# Patient Record
Sex: Female | Born: 1945 | Race: Black or African American | Hispanic: No | Marital: Single | Smoking: Never smoker
Health system: Southern US, Community
[De-identification: ages and names within clinical notes are randomized; demographics above are authoritative.]

## PROBLEM LIST (undated history)

## (undated) DIAGNOSIS — E119 Type 2 diabetes mellitus without complications: Secondary | ICD-10-CM

## (undated) DIAGNOSIS — H409 Unspecified glaucoma: Secondary | ICD-10-CM

## (undated) DIAGNOSIS — I639 Cerebral infarction, unspecified: Secondary | ICD-10-CM

## (undated) DIAGNOSIS — E785 Hyperlipidemia, unspecified: Secondary | ICD-10-CM

## (undated) DIAGNOSIS — F32A Depression, unspecified: Secondary | ICD-10-CM

## (undated) DIAGNOSIS — F329 Major depressive disorder, single episode, unspecified: Secondary | ICD-10-CM

## (undated) DIAGNOSIS — K219 Gastro-esophageal reflux disease without esophagitis: Secondary | ICD-10-CM

## (undated) DIAGNOSIS — I10 Essential (primary) hypertension: Secondary | ICD-10-CM

## (undated) DIAGNOSIS — N189 Chronic kidney disease, unspecified: Secondary | ICD-10-CM

## (undated) DIAGNOSIS — I2510 Atherosclerotic heart disease of native coronary artery without angina pectoris: Secondary | ICD-10-CM

## (undated) HISTORY — PX: ABDOMINAL HYSTERECTOMY: SHX81

---

## 2013-06-11 ENCOUNTER — Ambulatory Visit: Payer: Self-pay | Admitting: Gastroenterology

## 2015-06-23 ENCOUNTER — Other Ambulatory Visit: Payer: Self-pay | Admitting: Gastroenterology

## 2015-06-23 DIAGNOSIS — R1310 Dysphagia, unspecified: Secondary | ICD-10-CM

## 2015-07-19 ENCOUNTER — Ambulatory Visit: Admission: RE | Admit: 2015-07-19 | Payer: Self-pay | Source: Ambulatory Visit

## 2015-08-25 ENCOUNTER — Ambulatory Visit
Admission: RE | Admit: 2015-08-25 | Discharge: 2015-08-25 | Disposition: A | Discharge status: Home / Self Care | Payer: Medicare Other | Source: Ambulatory Visit | Attending: Gastroenterology | Admitting: Gastroenterology

## 2015-08-25 DIAGNOSIS — K224 Dyskinesia of esophagus: Secondary | ICD-10-CM | POA: Insufficient documentation

## 2015-08-25 DIAGNOSIS — R1310 Dysphagia, unspecified: Secondary | ICD-10-CM

## 2015-12-14 ENCOUNTER — Ambulatory Visit: Admit: 2015-12-14 | Payer: Medicare Other | Admitting: Gastroenterology

## 2015-12-14 SURGERY — ESOPHAGOGASTRODUODENOSCOPY (EGD) WITH PROPOFOL
Anesthesia: General

## 2017-11-14 ENCOUNTER — Other Ambulatory Visit: Payer: Self-pay | Admitting: Family Medicine

## 2017-11-14 DIAGNOSIS — R1312 Dysphagia, oropharyngeal phase: Secondary | ICD-10-CM

## 2017-11-15 ENCOUNTER — Other Ambulatory Visit: Payer: Self-pay | Admitting: Family Medicine

## 2017-11-15 DIAGNOSIS — R1312 Dysphagia, oropharyngeal phase: Secondary | ICD-10-CM

## 2017-11-21 ENCOUNTER — Ambulatory Visit: Payer: Medicare Other

## 2017-12-12 ENCOUNTER — Ambulatory Visit
Admission: RE | Admit: 2017-12-12 | Discharge: 2017-12-12 | Disposition: A | Discharge status: Home / Self Care | Payer: Medicare Other | Source: Ambulatory Visit | Attending: Family Medicine | Admitting: Family Medicine

## 2017-12-12 DIAGNOSIS — R1312 Dysphagia, oropharyngeal phase: Secondary | ICD-10-CM | POA: Insufficient documentation

## 2017-12-12 NOTE — Therapy (Signed)
Crystal Vargas, [place], [address] Phone: [phone]   Fax:     Modified Barium Swallow  Patient Details  Name: Crystal Vargas MRN: [id number] Date of Birth: December 30, 1945 No data recorded  Encounter Date: 12/12/2017  End of Session - 12/12/17 1338    Visit Number  1    Number of Visits  1    Date for SLP Re-Evaluation  12/12/17       No past medical history on file.    There were no vitals filed for this visit.   Subjective: Patient behavior: (alertness, ability to follow instructions, etc.):  The patient is able to follow directions within her physical capabilities  Chief complaint: food/pills getting stuck in esophagus   Barium Swallow Study 2017: "The study is very limited due to the patient's hemiparesisis and inability to stand or be semi upright. The patient ingested the barium without difficulty. The cervical esophagus distended well fluoroscopically and there was no laryngeal penetration of the barium. However, when the barium reached the thoracic esophagus significant stasis was observed. Very poor propagation of the primary or secondary peristaltic waves was observed. Under fluoroscopic observation the thoracic esophagus was seen to distend normally. The distal esophagus would gradually relax and allow all partial emptying of the esophagus. A barium tablet was not administered as it was obvious that can item of that size would not pass through the distal esophagus."   Objective:  Radiological Procedure: A videoflouroscopic evaluation of oral-preparatory, reflex initiation, and pharyngeal phases of the swallow was performed; as well as a screening of the upper esophageal phase.  I. POSTURE: Upright in MBS chair  II. VIEW: Lateral  III. COMPENSATORY STRATEGIES: natural chin down posture for most swallows- the one episode of aspiration occurred with the patient lifted her chin before initiating a  swallow  IV. BOLUSES ADMINISTERED:   Thin Liquid: 1 small, 2 consecutive, 3 straw   Nectar-thick Liquid: 2 small   Honey-thick Liquid: DNT   Puree: 2 teaspoon presentations   Mechanical Soft: 1/4 graham cracker in applesauce  V. RESULTS OF EVALUATION: A. ORAL PREPARATORY PHASE: (The lips, tongue, and velum are observed for strength and coordination)       **Overall Severity Rating: Mild-moderate; prolonged posterior transfer (5 seconds across consistencies)  B. SWALLOW INITIATION/REFLEX: (The reflex is normal if "triggered" by the time the bolus reached the base of the tongue)  **Overall Severity Rating: Mild-moderate; triggers while falling from the valleculae to the pyriform sinuses  C. PHARYNGEAL PHASE: (Pharyngeal function is normal if the bolus shows rapid, smooth, and continuous transit through the pharynx and there is no pharyngeal residue after the swallow)  **Overall Severity Rating: within normal limits   D. LARYNGEAL PENETRATION: (Material entering into the laryngeal inlet/vestibule but not aspirated) during the swallow with nectar-thick and thin liquids  E. ASPIRATION: X1, audible, before the swallow, occurred when patient lifted chin prior to initiating a pharyngeal swallow F. ESOPHAGEAL PHASE: (Screening of the upper esophagus)  ASSESSMENT: This 72 year old woman; with complaint of meds/foods sticking in the esophagus; is presenting with mild-moderate oropharyngeal dysphagia characterized by disorganized, prolonged posterior transfer, delayed pharyngeal swallow initiation, laryngeal penetration during swallow with liquids (nectar-thick and thin), and one episode of trace aspiration (audible) when patient lifted her chin to swallow the last thin liquid bolus.  Aspects of the pharyngeal stage of swallowing including tongue base retraction, hyolaryngeal excursion, epiglottic inversion, and duration/amplitude of UES opening are within  normal limits and there is no pharyngeal  residue.  The patient is at risk for trace aspiration but there does not appear to be history of pneumonia/bronchitis concerning for negative sequelae to aspiration.  The patient was given a whole barium tablet (in applesauce) which lodged in the cervical esophagus.  The patient's complaints appear to be primarily due to esophageal dysphagia and may benefit from consult with GI.  PLAN/RECOMMENDATIONS:   A. Diet: Mechanical soft   B. Swallowing Precautions: meds in puree, crush as able; chin down posture; monitor for increased clinical indicators of aspiration    C. Recommended consultation to: GI   D. Therapy recommendations: education   E. Results and recommendations were discussed with the patient immediately following the study and the final report routed to the referring MD and treating SLP.   Patient will benefit from skilled therapeutic intervention in order to improve the following deficits and impairments:   Oropharyngeal dysphagia - Plan: DG OP Swallowing Func-Medicare/Speech Path, DG OP Swallowing Func-Medicare/Speech Path        Problem List There are no active problems to display for this patient.  Crystal Sea, MS/CCC- SLP  Crystal Vargas 12/12/2017, 1:38 PM  [facility] [place], [place], [address] Phone: [phone]   Fax:     Name: Crystal Vargas MRN: [id number] Date of Birth: [birth year]

## 2017-12-25 ENCOUNTER — Other Ambulatory Visit: Payer: Self-pay | Admitting: Gastroenterology

## 2017-12-25 DIAGNOSIS — R1310 Dysphagia, unspecified: Secondary | ICD-10-CM

## 2017-12-28 ENCOUNTER — Ambulatory Visit
Admission: RE | Admit: 2017-12-28 | Discharge: 2017-12-28 | Disposition: A | Discharge status: Home / Self Care | Payer: Medicare Other | Source: Ambulatory Visit | Attending: Gastroenterology | Admitting: Gastroenterology

## 2017-12-28 DIAGNOSIS — R1310 Dysphagia, unspecified: Secondary | ICD-10-CM | POA: Insufficient documentation

## 2018-01-18 ENCOUNTER — Encounter: Payer: Self-pay | Admitting: *Deleted

## 2018-01-21 ENCOUNTER — Encounter
Admission: RE | Disposition: A | Discharge status: Home / Self Care | Payer: Self-pay | Source: Ambulatory Visit | Attending: Unknown Physician Specialty

## 2018-01-21 ENCOUNTER — Ambulatory Visit: Payer: Medicare Other | Admitting: Anesthesiology

## 2018-01-21 ENCOUNTER — Encounter: Payer: Self-pay | Admitting: *Deleted

## 2018-01-21 ENCOUNTER — Ambulatory Visit
Admission: RE | Admit: 2018-01-21 | Discharge: 2018-01-21 | Disposition: A | Discharge status: Home / Self Care | Payer: Medicare Other | Source: Ambulatory Visit | Attending: Unknown Physician Specialty | Admitting: Unknown Physician Specialty

## 2018-01-21 DIAGNOSIS — E1122 Type 2 diabetes mellitus with diabetic chronic kidney disease: Secondary | ICD-10-CM | POA: Insufficient documentation

## 2018-01-21 DIAGNOSIS — I2510 Atherosclerotic heart disease of native coronary artery without angina pectoris: Secondary | ICD-10-CM | POA: Insufficient documentation

## 2018-01-21 DIAGNOSIS — Z794 Long term (current) use of insulin: Secondary | ICD-10-CM | POA: Insufficient documentation

## 2018-01-21 DIAGNOSIS — R933 Abnormal findings on diagnostic imaging of other parts of digestive tract: Secondary | ICD-10-CM | POA: Insufficient documentation

## 2018-01-21 DIAGNOSIS — F329 Major depressive disorder, single episode, unspecified: Secondary | ICD-10-CM | POA: Insufficient documentation

## 2018-01-21 DIAGNOSIS — I129 Hypertensive chronic kidney disease with stage 1 through stage 4 chronic kidney disease, or unspecified chronic kidney disease: Secondary | ICD-10-CM | POA: Insufficient documentation

## 2018-01-21 DIAGNOSIS — H409 Unspecified glaucoma: Secondary | ICD-10-CM | POA: Insufficient documentation

## 2018-01-21 DIAGNOSIS — R1310 Dysphagia, unspecified: Secondary | ICD-10-CM | POA: Diagnosis present

## 2018-01-21 DIAGNOSIS — Z79899 Other long term (current) drug therapy: Secondary | ICD-10-CM | POA: Diagnosis not present

## 2018-01-21 DIAGNOSIS — K219 Gastro-esophageal reflux disease without esophagitis: Secondary | ICD-10-CM | POA: Diagnosis not present

## 2018-01-21 DIAGNOSIS — Z7902 Long term (current) use of antithrombotics/antiplatelets: Secondary | ICD-10-CM | POA: Insufficient documentation

## 2018-01-21 DIAGNOSIS — N189 Chronic kidney disease, unspecified: Secondary | ICD-10-CM | POA: Diagnosis not present

## 2018-01-21 DIAGNOSIS — I69354 Hemiplegia and hemiparesis following cerebral infarction affecting left non-dominant side: Secondary | ICD-10-CM | POA: Diagnosis not present

## 2018-01-21 DIAGNOSIS — K222 Esophageal obstruction: Secondary | ICD-10-CM | POA: Diagnosis not present

## 2018-01-21 DIAGNOSIS — E785 Hyperlipidemia, unspecified: Secondary | ICD-10-CM | POA: Insufficient documentation

## 2018-01-21 HISTORY — DX: Atherosclerotic heart disease of native coronary artery without angina pectoris: I25.10

## 2018-01-21 HISTORY — DX: Hyperlipidemia, unspecified: E78.5

## 2018-01-21 HISTORY — DX: Depression, unspecified: F32.A

## 2018-01-21 HISTORY — DX: Cerebral infarction, unspecified: I63.9

## 2018-01-21 HISTORY — PX: ESOPHAGOGASTRODUODENOSCOPY (EGD) WITH PROPOFOL: SHX5813

## 2018-01-21 HISTORY — DX: Major depressive disorder, single episode, unspecified: F32.9

## 2018-01-21 HISTORY — DX: Unspecified glaucoma: H40.9

## 2018-01-21 HISTORY — DX: Type 2 diabetes mellitus without complications: E11.9

## 2018-01-21 HISTORY — DX: Chronic kidney disease, unspecified: N18.9

## 2018-01-21 HISTORY — DX: Essential (primary) hypertension: I10

## 2018-01-21 HISTORY — DX: Gastro-esophageal reflux disease without esophagitis: K21.9

## 2018-01-21 SURGERY — ESOPHAGOGASTRODUODENOSCOPY (EGD) WITH PROPOFOL
Anesthesia: General

## 2018-01-21 MED ORDER — MIDAZOLAM HCL 5 MG/5ML IJ SOLN
INTRAMUSCULAR | Status: DC | PRN
  Administered 2018-01-21: 0.5 mg via INTRAVENOUS

## 2018-01-21 MED ORDER — SODIUM CHLORIDE 0.9 % IV SOLN
INTRAVENOUS | Status: DC
  Administered 2018-01-21: 08:00:00 via INTRAVENOUS

## 2018-01-21 MED ORDER — SODIUM CHLORIDE 0.9 % IV SOLN: INTRAVENOUS | Status: DC

## 2018-01-21 MED ORDER — FENTANYL CITRATE (PF) 100 MCG/2ML IJ SOLN
INTRAMUSCULAR | Status: DC | PRN
  Administered 2018-01-21: 25 ug via INTRAVENOUS

## 2018-01-21 MED ORDER — GLYCOPYRROLATE 0.2 MG/ML IJ SOLN
INTRAMUSCULAR | Status: DC | PRN
  Administered 2018-01-21: 0.2 mg via INTRAVENOUS

## 2018-01-21 MED ORDER — FENTANYL CITRATE (PF) 100 MCG/2ML IJ SOLN
INTRAMUSCULAR | Status: AC
  Filled 2018-01-21: qty 2

## 2018-01-21 MED ORDER — PROPOFOL 500 MG/50ML IV EMUL
INTRAVENOUS | Status: DC | PRN
  Administered 2018-01-21: 50 ug/kg/min via INTRAVENOUS

## 2018-01-21 MED ORDER — LIDOCAINE HCL (PF) 2 % IJ SOLN
INTRAMUSCULAR | Status: DC | PRN
  Administered 2018-01-21: 100 mg

## 2018-01-21 MED ORDER — BUTAMBEN-TETRACAINE-BENZOCAINE 2-2-14 % EX AERO
INHALATION_SPRAY | CUTANEOUS | Status: DC | PRN
  Administered 2018-01-21: 1 via TOPICAL

## 2018-01-21 MED ORDER — PROPOFOL 500 MG/50ML IV EMUL
INTRAVENOUS | Status: AC
  Filled 2018-01-21: qty 50

## 2018-01-21 MED ORDER — PROPOFOL 10 MG/ML IV BOLUS
INTRAVENOUS | Status: DC | PRN
  Administered 2018-01-21: 20 mg via INTRAVENOUS

## 2018-01-21 MED ORDER — LIDOCAINE HCL (PF) 2 % IJ SOLN
INTRAMUSCULAR | Status: AC
  Filled 2018-01-21: qty 10

## 2018-01-21 MED ORDER — GLYCOPYRROLATE 0.2 MG/ML IJ SOLN
INTRAMUSCULAR | Status: AC
  Filled 2018-01-21: qty 1

## 2018-01-21 MED ORDER — PHENYLEPHRINE HCL 10 MG/ML IJ SOLN
INTRAMUSCULAR | Status: AC
  Filled 2018-01-21: qty 1

## 2018-01-21 MED ORDER — MIDAZOLAM HCL 2 MG/2ML IJ SOLN
INTRAMUSCULAR | Status: AC
  Filled 2018-01-21: qty 2

## 2018-01-21 MED ORDER — EPHEDRINE SULFATE 50 MG/ML IJ SOLN
INTRAMUSCULAR | Status: AC
  Filled 2018-01-21: qty 1

## 2018-01-21 MED ORDER — BUTAMBEN-TETRACAINE-BENZOCAINE 2-2-14 % EX AERO
INHALATION_SPRAY | CUTANEOUS | Status: AC
  Filled 2018-01-21: qty 5

## 2018-01-21 NOTE — Op Note (Signed)
[facility] Patient Name: Crystal Vargas Procedure Date: 01/21/2018 7:28 AM MRN: [id number] Account #: [id number] Date of Birth: 04-03-1945 Admit Type: Outpatient Age: 72 Room: [facility] ENDO ROOM 1 Gender: Female Note Status: Finalized Procedure:            Upper GI endoscopy Indications:          Dysphagia, Abnormal UGI series Providers:            Manya Silvas, MD Referring MD:         No Local Md, MD (Referring MD) Medicines:            Propofol per Anesthesia, Cetacaine spray Complications:        No immediate complications. Procedure:            Pre-Anesthesia Assessment:                       - After reviewing the risks and benefits, the patient                        was deemed in satisfactory condition to undergo the                        procedure.                       After obtaining informed consent, the endoscope was                        passed under direct vision. Throughout the procedure,                        the patient's blood pressure, pulse, and oxygen                        saturations were monitored continuously. The Endoscope                        was introduced through the mouth, and advanced to the                        second part of duodenum. The upper GI endoscopy was                        accomplished without difficulty. The patient tolerated                        the procedure well. Findings:      A moderate Schatzki ring was found in the proximal esophagus and in the       distal esophagus.      The stomach was normal.      The examined duodenum was normal.      After exam of the esophagus showing rings proximal and distal I passed a       guidewire and removed the scope and then passed a 38 F      savary dilator and a 42 F dilator. Some blood seen on dilator. Impression:           - Moderate Schatzki ring.                       -  Normal stomach.                       - Normal examined duodenum.             - No specimens collected. Recommendation:       - The findings and recommendations were discussed with                        the patient's family. Manya Silvas, MD 01/21/2018 8:13:02 AM This report has been signed electronically. Number of Addenda: 0 Note Initiated On: 01/21/2018 7:28 AM      [facility]

## 2018-01-21 NOTE — Anesthesia Post-op Follow-up Note (Signed)
Anesthesia QCDR form completed.        

## 2018-01-21 NOTE — H&P (Signed)
Primary Care Physician:  Marisa Hua, MD Primary Gastroenterologist:  Dr. Vira Agar  Pre-Procedure History & Physical: HPI:  Crystal Vargas is a 72 y.o. female is here for an endoscopy.  Done for dysphagia and abnormal barium tablet study.     Past Medical History:  Diagnosis Date  . Chronic kidney disease    RENAL INSUFFICIENCY  . Coronary artery disease   . Depression   . Diabetes mellitus without complication ([place])   . GERD (gastroesophageal reflux disease)   . Glaucoma   . Hyperlipidemia   . Hypertension   . Stroke [facility])     Past Surgical History:  Procedure Laterality Date  . ABDOMINAL HYSTERECTOMY      Prior to Admission medications   Medication Sig Start Date End Date Taking? Authorizing Provider  acetaminophen (TYLENOL) 325 MG tablet Take 650 mg by mouth every 6 (six) hours as needed.   Yes [provider]  allopurinol (ZYLOPRIM) 100 MG tablet Take 100 mg by mouth daily.   Yes [provider]  amLODipine (NORVASC) 10 MG tablet Take 10 mg by mouth daily.   Yes [provider]  atorvastatin (LIPITOR) 20 MG tablet Take 20 mg by mouth daily.   Yes [provider]  atorvastatin (LIPITOR) 40 MG tablet Take 40 mg by mouth daily.   Yes [provider]  brimonidine (ALPHAGAN) 0.2 % ophthalmic solution 1 drop 3 (three) times daily.   Yes [provider]  brimonidine-timolol (COMBIGAN) 0.2-0.5 % ophthalmic solution Place 1 drop into both eyes every 12 (twelve) hours.   Yes [provider]  chlorthalidone (HYGROTON) 25 MG tablet Take 25 mg by mouth daily.   Yes [provider]  cloNIDine (CATAPRES - DOSED IN MG/24 HR) 0.3 mg/24hr patch Place 0.3 mg onto the skin once a week.   Yes [provider]  cloNIDine (CATAPRES) 0.2 MG tablet Take 0.2 mg by mouth 2 (two) times daily.   Yes [provider]  clopidogrel (PLAVIX) 75 MG tablet Take 75 mg by mouth daily.   Yes [provider]   Cyanocobalamin (VITAMIN B 12) 500 MCG TABS Take 1,000 mcg by mouth daily.   Yes [provider]  DM-Benzocaine-Menthol (CHLORASEPTIC TOTAL MT) Use as directed 1 drop in the mouth or throat as directed. 1 GTT.EVERY HOUR   Yes [provider]  gabapentin (NEURONTIN) 400 MG capsule Take 400 mg by mouth 2 (two) times daily.   Yes [provider]  hydrALAZINE (APRESOLINE) 25 MG tablet Take 25 mg by mouth 2 (two) times daily.   Yes [provider]  hydrochlorothiazide (HYDRODIURIL) 25 MG tablet Take 25 mg by mouth daily.   Yes [provider]  insulin glargine (LANTUS) 100 UNIT/ML injection Inject 20 Units into the skin daily.   Yes [provider]  insulin NPH-regular Human (70-30) 100 UNIT/ML injection Inject 100 Units into the skin 2 (two) times daily with a meal.   Yes [provider]  Iron-Vitamins (THEREMS H PO) Take 1 tablet by mouth daily.   Yes [provider]  latanoprost (XALATAN) 0.005 % ophthalmic solution Place 1 drop into both eyes at bedtime.   Yes [provider]  lisinopril (PRINIVIL,ZESTRIL) 40 MG tablet Take 40 mg by mouth 2 (two) times daily.   Yes [provider]  metoprolol succinate (TOPROL-XL) 25 MG 24 hr tablet Take 25 mg by mouth daily.   Yes [provider]  Multiple Vitamins-Calcium (ONE-A-DAY WOMENS FORMULA) TABS Take 1  tablet by mouth daily.   Yes [provider]  Oxcarbazepine (TRILEPTAL) 300 MG tablet Take 300 mg by mouth 2 (two) times daily.   Yes [provider]  pantoprazole (PROTONIX) 40 MG tablet Take 40 mg by mouth daily.   Yes [provider]  phenytoin (DILANTIN) 100 MG ER capsule Take 200 mg by mouth at bedtime.   Yes [provider]  phenytoin (DILANTIN) 50 MG tablet Chew by mouth 3 (three) times daily.   Yes [provider]  simvastatin (ZOCOR) 20 MG tablet Take 20 mg by mouth daily.   Yes [provider]   tiZANidine (ZANAFLEX) 4 MG capsule Take 4 mg by mouth 3 (three) times daily.   Yes [provider]  traZODone (DESYREL) 150 MG tablet Take 150 mg by mouth at bedtime.   Yes [provider]  traZODone (DESYREL) 50 MG tablet Take 50 mg by mouth at bedtime.   Yes [provider]  Wheat Dextrin (BENEFIBER DRINK MIX PO) Take 1 Scoop by mouth daily.   Yes [provider]  isosorbide dinitrate (ISORDIL) 10 MG tablet Take 10 mg by mouth 3 (three) times daily.    [provider]  loratadine (CLARITIN) 10 MG tablet Take 10 mg by mouth daily.    [provider]    Allergies as of 01/10/2018  . (Not on File)    History reviewed. No pertinent family history.  Social History   Socioeconomic History  . Marital status: Single    Spouse name: Not on file  . Number of children: Not on file  . Years of education: Not on file  . Highest education level: Not on file  Occupational History  . Not on file  Social Needs  . Financial resource strain: Not on file  . Food insecurity:    Worry: Not on file    Inability: Not on file  . Transportation needs:    Medical: Not on file    Non-medical: Not on file  Tobacco Use  . Smoking status: Never Smoker  . Smokeless tobacco: Never Used  Substance and Sexual Activity  . Alcohol use: Not Currently  . Drug use: Never  . Sexual activity: Not on file  Lifestyle  . Physical activity:    Days per week: Not on file    Minutes per session: Not on file  . Stress: Not on file  Relationships  . Social connections:    Talks on phone: Not on file    Gets together: Not on file    Attends religious service: Not on file    Active member of club or organization: Not on file    Attends meetings of clubs or organizations: Not on file    Relationship status: Not on file  . Intimate partner violence:    Fear of current or ex partner: Not on file    Emotionally abused: Not on file    Physically abused: Not on  file    Forced sexual activity: Not on file  Other Topics Concern  . Not on file  Social History Narrative  . Not on file    Review of Systems: See HPI, otherwise negative ROS  Physical Exam: BP (!) 176/74   Pulse 64   Temp (!) 96.9 F (36.1 C) (Tympanic)   Resp 18   Ht 5' 3.5" (1.613 m)   Wt 76.2 kg   BMI 29.29 kg/m  General:   Alert,  pleasant and cooperative in NAD  Head:  Normocephalic and atraumatic. Neck:  Supple; no masses or thyromegaly. Lungs:  Clear throughout to auscultation.    Heart:  Regular rate and rhythm. Abdomen:  Soft, nontender and nondistended. Normal bowel sounds, without guarding, and without rebound.   Neurologic:  Alert and  oriented x4;  grossly normal neurologically.  Impression/Plan: Crystal Vargas is here for an endoscopy to be performed for dysphagia with mild relative narrowing of upper cervical esophagus.  Risks, benefits, limitations, and alternatives regarding  endoscopy have been reviewed with the patient.  Questions have been answered.  All parties agreeable.   Gaylyn Cheers, MD  01/21/2018, 7:51 AM

## 2018-01-21 NOTE — Anesthesia Postprocedure Evaluation (Signed)
Anesthesia Post Note  Patient: Crystal Vargas  Procedure(s) Performed: ESOPHAGOGASTRODUODENOSCOPY (EGD) WITH PROPOFOL (N/A )  Patient location during evaluation: PACU Anesthesia Type: General Level of consciousness: awake and alert Pain management: pain level controlled Vital Signs Assessment: post-procedure vital signs reviewed and stable Respiratory status: spontaneous breathing, nonlabored ventilation, respiratory function stable and patient connected to nasal cannula oxygen Cardiovascular status: blood pressure returned to baseline and stable Postop Assessment: no apparent nausea or vomiting Anesthetic complications: no     Last Vitals:  Vitals:   01/21/18 0836 01/21/18 0846  BP: (!) 154/106 (!) 156/107  Pulse: 69 66  Resp: 15 15  Temp:    SpO2: 100% 100%    Last Pain:  Vitals:   01/21/18 0846  TempSrc:   PainSc: 0-No pain                 Durenda Hurt

## 2018-01-21 NOTE — Anesthesia Preprocedure Evaluation (Addendum)
Anesthesia Evaluation  Patient identified by MRN, date of birth, ID band Patient awake    Reviewed: Allergy & Precautions, H&P , NPO status , Patient's Chart, lab work & pertinent test results  Airway Mallampati: III       Dental  (+) Chipped, Missing   Pulmonary neg pulmonary ROS, neg COPD,           Cardiovascular hypertension, + CAD  negative cardio ROS       Neuro/Psych PSYCHIATRIC DISORDERS Depression CVA (2002, left sided weakness), Residual Symptoms negative psych ROS   GI/Hepatic negative GI ROS, Neg liver ROS, GERD  ,  Endo/Other  negative endocrine ROSdiabetes  Renal/GU CRFRenal diseasenegative Renal ROS  negative genitourinary   Musculoskeletal   Abdominal   Peds  Hematology negative hematology ROS (+)   Anesthesia Other Findings Past Medical History: No date: Chronic kidney disease     Comment:  RENAL INSUFFICIENCY No date: Coronary artery disease No date: Depression No date: Diabetes mellitus without complication ([facility]) No date: GERD (gastroesophageal reflux disease) No date: Glaucoma No date: Hyperlipidemia No date: Hypertension No date: Stroke [facility])  Past Surgical History: No date: ABDOMINAL HYSTERECTOMY     Reproductive/Obstetrics negative OB ROS                            Anesthesia Physical Anesthesia Plan  ASA: III  Anesthesia Plan: General   Post-op Pain Management:    Induction:   PONV Risk Score and Plan: Propofol infusion and TIVA  Airway Management Planned:   Additional Equipment:   Intra-op Plan:   Post-operative Plan:   Informed Consent: I have reviewed the patients History and Physical, chart, labs and discussed the procedure including the risks, benefits and alternatives for the proposed anesthesia with the patient or authorized representative who has indicated his/her understanding and acceptance.   Dental Advisory Given  Plan  Discussed with: Anesthesiologist, CRNA and Surgeon  Anesthesia Plan Comments:         Anesthesia Quick Evaluation

## 2018-01-21 NOTE — Transfer of Care (Signed)
Immediate Anesthesia Transfer of Care Note  Patient: Crystal Vargas  Procedure(s) Performed: ESOPHAGOGASTRODUODENOSCOPY (EGD) WITH PROPOFOL (N/A )  Patient Location: PACU  Anesthesia Type:General  Level of Consciousness: awake, alert  and oriented  Airway & Oxygen Therapy: Patient Spontanous Breathing and Patient connected to nasal cannula oxygen  Post-op Assessment: Report given to RN and Post -op Vital signs reviewed and stable  Post vital signs: Reviewed and stable  Last Vitals:  Vitals Value Taken Time  BP    Temp    Pulse    Resp    SpO2      Last Pain:  Vitals:   01/21/18 0738  TempSrc: Tympanic  PainSc: 0-No pain         Complications: No apparent anesthesia complications

## 2018-05-27 ENCOUNTER — Ambulatory Visit
Admission: RE | Admit: 2018-05-27 | Payer: Medicare Other | Source: Home / Self Care | Admitting: Unknown Physician Specialty

## 2018-05-27 ENCOUNTER — Encounter: Admission: RE | Payer: Self-pay | Source: Home / Self Care

## 2018-05-27 SURGERY — ESOPHAGOGASTRODUODENOSCOPY (EGD) WITH PROPOFOL
Anesthesia: General

## 2019-04-08 ENCOUNTER — Encounter (INDEPENDENT_AMBULATORY_CARE_PROVIDER_SITE_OTHER): Payer: Medicare Other | Admitting: Vascular Surgery

## 2019-04-15 ENCOUNTER — Ambulatory Visit (INDEPENDENT_AMBULATORY_CARE_PROVIDER_SITE_OTHER): Payer: Medicare Other | Admitting: Vascular Surgery

## 2019-04-15 ENCOUNTER — Other Ambulatory Visit: Payer: Self-pay

## 2019-04-15 ENCOUNTER — Encounter (INDEPENDENT_AMBULATORY_CARE_PROVIDER_SITE_OTHER): Payer: Self-pay | Admitting: Vascular Surgery

## 2019-04-15 DIAGNOSIS — Z992 Dependence on renal dialysis: Secondary | ICD-10-CM

## 2019-04-15 DIAGNOSIS — Z794 Long term (current) use of insulin: Secondary | ICD-10-CM

## 2019-04-15 DIAGNOSIS — E1122 Type 2 diabetes mellitus with diabetic chronic kidney disease: Secondary | ICD-10-CM | POA: Diagnosis not present

## 2019-04-15 DIAGNOSIS — E119 Type 2 diabetes mellitus without complications: Secondary | ICD-10-CM | POA: Insufficient documentation

## 2019-04-15 DIAGNOSIS — N186 End stage renal disease: Secondary | ICD-10-CM | POA: Insufficient documentation

## 2019-04-15 DIAGNOSIS — I10 Essential (primary) hypertension: Secondary | ICD-10-CM | POA: Diagnosis not present

## 2019-04-15 NOTE — Assessment & Plan Note (Signed)
The patient has a PermCath in place with a normal exit site and no signs of infection.  There is no erythema or drainage.  There is no associated wound outside of the exit site which is typical.  I see no problem with his catheter at all.  Continue to use the catheter.

## 2019-04-15 NOTE — Progress Notes (Signed)
Patient ID: Crystal Vargas, female   DOB: 1945/09/25, 74 y.o.   MRN: [id number]  Chief Complaint  Patient presents with  . New Patient (Initial Visit)    NP Late open wound on cvc W drainage     HPI Crystal Vargas is a 74 y.o. female.  I am asked to see the patient by Dr. Holley Raring for evaluation of a reported wound around the central venous catheter.  The patient had this placed some months ago at another institution.  We have not seen the patient prior to this.  It does not hurt the patient.  She has no fevers or chills.  This was her first dialysis access placement.     Past Medical History:  Diagnosis Date  . Chronic kidney disease    RENAL INSUFFICIENCY  . Coronary artery disease   . Depression   . Diabetes mellitus without complication ([place])   . GERD (gastroesophageal reflux disease)   . Glaucoma   . Hyperlipidemia   . Hypertension   . Stroke [facility])     Past Surgical History:  Procedure Laterality Date  . ABDOMINAL HYSTERECTOMY    . ESOPHAGOGASTRODUODENOSCOPY (EGD) WITH PROPOFOL N/A 01/21/2018   Procedure: ESOPHAGOGASTRODUODENOSCOPY (EGD) WITH PROPOFOL;  Surgeon: Manya Silvas, MD;  Location: [facility] ENDOSCOPY;  Service: Endoscopy;  Laterality: N/A;    Family History No bleeding or clotting disorders.  Lives in a facility and she is a very poor historian  Social History   Tobacco Use  . Smoking status: Never Smoker  . Smokeless tobacco: Never Used  Substance Use Topics  . Alcohol use: Not Currently  . Drug use: Never  Lives in a facility   Allergies  Allergen Reactions  . Abacavir Other (See Comments)  . Cephalosporins Other (See Comments)  . Ciprofloxacin Other (See Comments)  . Nsaids Other (See Comments)  . Penicillin G Other (See Comments)  . Quinolones Other (See Comments)  . Salicylates Other (See Comments)    Current Outpatient Medications  Medication Sig Dispense Refill  . acetaminophen (TYLENOL) 325 MG tablet Take 650 mg by mouth every 6 (six) hours  as needed.    Marland Kitchen allopurinol (ZYLOPRIM) 100 MG tablet Take 100 mg by mouth daily.    Marland Kitchen amLODipine (NORVASC) 10 MG tablet Take 10 mg by mouth daily.    Marland Kitchen atorvastatin (LIPITOR) 20 MG tablet Take 20 mg by mouth daily.    Marland Kitchen atorvastatin (LIPITOR) 40 MG tablet Take 40 mg by mouth daily.    . brimonidine (ALPHAGAN) 0.2 % ophthalmic solution 1 drop 3 (three) times daily.    . brimonidine-timolol (COMBIGAN) 0.2-0.5 % ophthalmic solution Place 1 drop into both eyes every 12 (twelve) hours.    . chlorthalidone (HYGROTON) 25 MG tablet Take 25 mg by mouth daily.    . cloNIDine (CATAPRES - DOSED IN MG/24 HR) 0.3 mg/24hr patch Place 0.3 mg onto the skin once a week.    . cloNIDine (CATAPRES) 0.2 MG tablet Take 0.2 mg by mouth 2 (two) times daily.    . clopidogrel (PLAVIX) 75 MG tablet Take 75 mg by mouth daily.    . Cyanocobalamin (VITAMIN B 12) 500 MCG TABS Take 1,000 mcg by mouth daily.    Marland Kitchen DM-Benzocaine-Menthol (CHLORASEPTIC TOTAL MT) Use as directed 1 drop in the mouth or throat as directed. 1 GTT.EVERY HOUR    . gabapentin (NEURONTIN) 400 MG capsule Take 400 mg by mouth 2 (two) times daily.    . hydrALAZINE (APRESOLINE) 25  MG tablet Take 25 mg by mouth 2 (two) times daily.    . hydrochlorothiazide (HYDRODIURIL) 25 MG tablet Take 25 mg by mouth daily.    . insulin glargine (LANTUS) 100 UNIT/ML injection Inject 20 Units into the skin daily.    . insulin NPH-regular Human (70-30) 100 UNIT/ML injection Inject 100 Units into the skin 2 (two) times daily with a meal.    . Iron-Vitamins (THEREMS H PO) Take 1 tablet by mouth daily.    . isosorbide dinitrate (ISORDIL) 10 MG tablet Take 10 mg by mouth 3 (three) times daily.    Marland Kitchen latanoprost (XALATAN) 0.005 % ophthalmic solution Place 1 drop into both eyes at bedtime.    Marland Kitchen lisinopril (PRINIVIL,ZESTRIL) 40 MG tablet Take 40 mg by mouth 2 (two) times daily.    Marland Kitchen loratadine (CLARITIN) 10 MG tablet Take 10 mg by mouth daily.    . metoprolol succinate (TOPROL-XL) 25  MG 24 hr tablet Take 25 mg by mouth daily.    . Multiple Vitamins-Calcium (ONE-A-DAY WOMENS FORMULA) TABS Take 1 tablet by mouth daily.    . Oxcarbazepine (TRILEPTAL) 300 MG tablet Take 300 mg by mouth 2 (two) times daily.    . pantoprazole (PROTONIX) 40 MG tablet Take 40 mg by mouth daily.    . phenytoin (DILANTIN) 100 MG ER capsule Take 200 mg by mouth at bedtime.    . phenytoin (DILANTIN) 50 MG tablet Chew by mouth 3 (three) times daily.    . simvastatin (ZOCOR) 20 MG tablet Take 20 mg by mouth daily.    Marland Kitchen tiZANidine (ZANAFLEX) 4 MG capsule Take 4 mg by mouth 3 (three) times daily.    . traZODone (DESYREL) 150 MG tablet Take 150 mg by mouth at bedtime.    . traZODone (DESYREL) 50 MG tablet Take 50 mg by mouth at bedtime.    . Wheat Dextrin (BENEFIBER DRINK MIX PO) Take 1 Scoop by mouth daily.     No current facility-administered medications for this visit.      REVIEW OF SYSTEMS (Negative unless checked)  Constitutional: [] Weight loss  [] Fever  [] Chills Cardiac: [] Chest pain   [] Chest pressure   [] Palpitations   [] Shortness of breath when laying flat   [] Shortness of breath at rest   [] Shortness of breath with exertion. Vascular:  [] Pain in legs with walking   [] Pain in legs at rest   [] Pain in legs when laying flat   [] Claudication   [] Pain in feet when walking  [] Pain in feet at rest  [] Pain in feet when laying flat   [] History of DVT   [] Phlebitis   [] Swelling in legs   [] Varicose veins   [] Non-healing ulcers Pulmonary:   [] Uses home oxygen   [] Productive cough   [] Hemoptysis   [] Wheeze  [] COPD   [] Asthma Neurologic:  [] Dizziness  [] Blackouts   [] Seizures   [] History of stroke   [] History of TIA  [] Aphasia   [] Temporary blindness   [] Dysphagia   [] Weakness or numbness in arms   [] Weakness or numbness in legs Musculoskeletal:  [x] Arthritis   [] Joint swelling   [] Joint pain   [] Low back pain Hematologic:  [] Easy bruising  [] Easy bleeding   [] Hypercoagulable state   [] Anemic   [] Hepatitis Gastrointestinal:  [] Blood in stool   [] Vomiting blood  [] Gastroesophageal reflux/heartburn   [] Abdominal pain Genitourinary:  [x] Chronic kidney disease   [] Difficult urination  [] Frequent urination  [] Burning with urination   [] Hematuria Skin:  [] Rashes   [] Ulcers   [] Wounds  Psychological:  [] History of anxiety   []  History of major depression.    Physical Exam BP (!) 78/49 (BP Location: Right Arm)   Pulse 65   Ht 5\' 2"  (1.575 m)   Wt 112 lb (50.8 kg)   BMI 20.49 kg/m  Gen:  WD/WN, NAD Head: [place]/AT, No temporalis wasting.  Ear/Nose/Throat: Hearing grossly intact, nares w/o erythema or drainage, oropharynx w/o Erythema/Exudate Eyes: Conjunctiva clear, sclera non-icteric  Neck: trachea midline.  No JVD.  Pulmonary:  Good air movement, respirations not labored, no use of accessory muscles  Cardiac: RRR, no JVD Vascular: PermCath in place coming from the right chest.  This is exiting through the typical exit wound with no surrounding erythema, drainage, or accessory wound of significance.  This looks like a typical PermCath with absolutely no problem. Vessel Right Left  Radial Palpable Palpable                                   Gastrointestinal:. No masses, surgical incisions, or scars. Musculoskeletal: M/S 5/5 throughout.  Extremities without ischemic changes.  No deformity or atrophy. In a wheelchair Neurologic: Sensation grossly intact in extremities.  Symmetrical.  Speech is fluent. Motor exam as listed above. Psychiatric: Judgment and insight are fair at best. Dermatologic: No rashes or ulcers noted.  No cellulitis or open wounds.    Radiology No results found.  Labs No results found for this or any previous visit (from the past 2160 hour(s)).  Assessment/Plan:  Diabetes ([place]) blood glucose control important in reducing the progression of atherosclerotic disease. Also, involved in wound healing. On appropriate medications.   Essential  hypertension Likely an underlying cause of her renal failure and blood pressure control important in reducing the progression of atherosclerotic disease. On appropriate oral medications.   ESRD on dialysis [facility]) The patient has a PermCath in place with a normal exit site and no signs of infection.  There is no erythema or drainage.  There is no associated wound outside of the exit site which is typical.  I see no problem with his catheter at all.  Continue to use the catheter.      Leotis Pain 04/15/2019, 12:38 PM   This note was created with Dragon medical transcription system.  Any errors from dictation are unintentional.

## 2019-04-15 NOTE — Assessment & Plan Note (Signed)
blood glucose control important in reducing the progression of atherosclerotic disease. Also, involved in wound healing. On appropriate medications.  

## 2019-04-15 NOTE — Assessment & Plan Note (Signed)
Likely an underlying cause of her renal failure and blood pressure control important in reducing the progression of atherosclerotic disease. On appropriate oral medications.

## 2019-05-10 ENCOUNTER — Inpatient Hospital Stay
Admission: EM | Admit: 2019-05-10 | Discharge: 2019-06-07 | DRG: 314 | Disposition: E | Payer: Medicare Other | Attending: Internal Medicine | Admitting: Internal Medicine

## 2019-05-10 ENCOUNTER — Emergency Department: Payer: Medicare Other

## 2019-05-10 ENCOUNTER — Other Ambulatory Visit: Payer: Self-pay

## 2019-05-10 DIAGNOSIS — Z8673 Personal history of transient ischemic attack (TIA), and cerebral infarction without residual deficits: Secondary | ICD-10-CM | POA: Diagnosis not present

## 2019-05-10 DIAGNOSIS — R7881 Bacteremia: Secondary | ICD-10-CM | POA: Diagnosis not present

## 2019-05-10 DIAGNOSIS — L8990 Pressure ulcer of unspecified site, unspecified stage: Secondary | ICD-10-CM | POA: Insufficient documentation

## 2019-05-10 DIAGNOSIS — D631 Anemia in chronic kidney disease: Secondary | ICD-10-CM | POA: Diagnosis present

## 2019-05-10 DIAGNOSIS — A4102 Sepsis due to Methicillin resistant Staphylococcus aureus: Secondary | ICD-10-CM | POA: Diagnosis present

## 2019-05-10 DIAGNOSIS — R1310 Dysphagia, unspecified: Secondary | ICD-10-CM

## 2019-05-10 DIAGNOSIS — J189 Pneumonia, unspecified organism: Secondary | ICD-10-CM | POA: Diagnosis not present

## 2019-05-10 DIAGNOSIS — E538 Deficiency of other specified B group vitamins: Secondary | ICD-10-CM | POA: Diagnosis present

## 2019-05-10 DIAGNOSIS — E43 Unspecified severe protein-calorie malnutrition: Secondary | ICD-10-CM | POA: Diagnosis present

## 2019-05-10 DIAGNOSIS — L89620 Pressure ulcer of left heel, unstageable: Secondary | ICD-10-CM | POA: Diagnosis present

## 2019-05-10 DIAGNOSIS — Z20822 Contact with and (suspected) exposure to covid-19: Secondary | ICD-10-CM | POA: Diagnosis present

## 2019-05-10 DIAGNOSIS — I5023 Acute on chronic systolic (congestive) heart failure: Secondary | ICD-10-CM | POA: Diagnosis present

## 2019-05-10 DIAGNOSIS — E1122 Type 2 diabetes mellitus with diabetic chronic kidney disease: Secondary | ICD-10-CM | POA: Diagnosis present

## 2019-05-10 DIAGNOSIS — R0602 Shortness of breath: Secondary | ICD-10-CM

## 2019-05-10 DIAGNOSIS — T8249XA Other complication of vascular dialysis catheter, initial encounter: Secondary | ICD-10-CM | POA: Diagnosis not present

## 2019-05-10 DIAGNOSIS — D696 Thrombocytopenia, unspecified: Secondary | ICD-10-CM | POA: Diagnosis not present

## 2019-05-10 DIAGNOSIS — J9600 Acute respiratory failure, unspecified whether with hypoxia or hypercapnia: Secondary | ICD-10-CM

## 2019-05-10 DIAGNOSIS — L89896 Pressure-induced deep tissue damage of other site: Secondary | ICD-10-CM | POA: Diagnosis present

## 2019-05-10 DIAGNOSIS — I10 Essential (primary) hypertension: Secondary | ICD-10-CM | POA: Diagnosis not present

## 2019-05-10 DIAGNOSIS — I132 Hypertensive heart and chronic kidney disease with heart failure and with stage 5 chronic kidney disease, or end stage renal disease: Secondary | ICD-10-CM | POA: Diagnosis present

## 2019-05-10 DIAGNOSIS — Y848 Other medical procedures as the cause of abnormal reaction of the patient, or of later complication, without mention of misadventure at the time of the procedure: Secondary | ICD-10-CM | POA: Diagnosis present

## 2019-05-10 DIAGNOSIS — Z515 Encounter for palliative care: Secondary | ICD-10-CM | POA: Diagnosis not present

## 2019-05-10 DIAGNOSIS — E1165 Type 2 diabetes mellitus with hyperglycemia: Secondary | ICD-10-CM | POA: Diagnosis present

## 2019-05-10 DIAGNOSIS — Z888 Allergy status to other drugs, medicaments and biological substances status: Secondary | ICD-10-CM

## 2019-05-10 DIAGNOSIS — Z881 Allergy status to other antibiotic agents status: Secondary | ICD-10-CM

## 2019-05-10 DIAGNOSIS — N186 End stage renal disease: Secondary | ICD-10-CM | POA: Diagnosis present

## 2019-05-10 DIAGNOSIS — E861 Hypovolemia: Secondary | ICD-10-CM | POA: Diagnosis not present

## 2019-05-10 DIAGNOSIS — L89152 Pressure ulcer of sacral region, stage 2: Secondary | ICD-10-CM | POA: Diagnosis not present

## 2019-05-10 DIAGNOSIS — Y838 Other surgical procedures as the cause of abnormal reaction of the patient, or of later complication, without mention of misadventure at the time of the procedure: Secondary | ICD-10-CM | POA: Diagnosis not present

## 2019-05-10 DIAGNOSIS — Y95 Nosocomial condition: Secondary | ICD-10-CM | POA: Diagnosis not present

## 2019-05-10 DIAGNOSIS — J9601 Acute respiratory failure with hypoxia: Secondary | ICD-10-CM | POA: Diagnosis present

## 2019-05-10 DIAGNOSIS — J9602 Acute respiratory failure with hypercapnia: Secondary | ICD-10-CM | POA: Diagnosis not present

## 2019-05-10 DIAGNOSIS — E872 Acidosis: Secondary | ICD-10-CM | POA: Diagnosis present

## 2019-05-10 DIAGNOSIS — R4182 Altered mental status, unspecified: Secondary | ICD-10-CM

## 2019-05-10 DIAGNOSIS — Z886 Allergy status to analgesic agent status: Secondary | ICD-10-CM

## 2019-05-10 DIAGNOSIS — D7589 Other specified diseases of blood and blood-forming organs: Secondary | ICD-10-CM | POA: Diagnosis not present

## 2019-05-10 DIAGNOSIS — N2581 Secondary hyperparathyroidism of renal origin: Secondary | ICD-10-CM | POA: Diagnosis present

## 2019-05-10 DIAGNOSIS — K0889 Other specified disorders of teeth and supporting structures: Secondary | ICD-10-CM | POA: Diagnosis not present

## 2019-05-10 DIAGNOSIS — R579 Shock, unspecified: Secondary | ICD-10-CM | POA: Diagnosis not present

## 2019-05-10 DIAGNOSIS — D649 Anemia, unspecified: Secondary | ICD-10-CM | POA: Diagnosis not present

## 2019-05-10 DIAGNOSIS — Z992 Dependence on renal dialysis: Secondary | ICD-10-CM

## 2019-05-10 DIAGNOSIS — Z79899 Other long term (current) drug therapy: Secondary | ICD-10-CM

## 2019-05-10 DIAGNOSIS — R5383 Other fatigue: Secondary | ICD-10-CM | POA: Diagnosis not present

## 2019-05-10 DIAGNOSIS — Z6828 Body mass index (BMI) 28.0-28.9, adult: Secondary | ICD-10-CM

## 2019-05-10 DIAGNOSIS — E878 Other disorders of electrolyte and fluid balance, not elsewhere classified: Secondary | ICD-10-CM | POA: Diagnosis present

## 2019-05-10 DIAGNOSIS — G92 Toxic encephalopathy: Secondary | ICD-10-CM | POA: Diagnosis not present

## 2019-05-10 DIAGNOSIS — K219 Gastro-esophageal reflux disease without esophagitis: Secondary | ICD-10-CM | POA: Diagnosis present

## 2019-05-10 DIAGNOSIS — R5381 Other malaise: Secondary | ICD-10-CM | POA: Diagnosis present

## 2019-05-10 DIAGNOSIS — R630 Anorexia: Secondary | ICD-10-CM | POA: Diagnosis not present

## 2019-05-10 DIAGNOSIS — R6521 Severe sepsis with septic shock: Secondary | ICD-10-CM | POA: Diagnosis present

## 2019-05-10 DIAGNOSIS — I69392 Facial weakness following cerebral infarction: Secondary | ICD-10-CM

## 2019-05-10 DIAGNOSIS — Z66 Do not resuscitate: Secondary | ICD-10-CM | POA: Diagnosis not present

## 2019-05-10 DIAGNOSIS — E119 Type 2 diabetes mellitus without complications: Secondary | ICD-10-CM

## 2019-05-10 DIAGNOSIS — D539 Nutritional anemia, unspecified: Secondary | ICD-10-CM | POA: Diagnosis not present

## 2019-05-10 DIAGNOSIS — E876 Hypokalemia: Secondary | ICD-10-CM | POA: Diagnosis present

## 2019-05-10 DIAGNOSIS — D62 Acute posthemorrhagic anemia: Secondary | ICD-10-CM | POA: Diagnosis not present

## 2019-05-10 DIAGNOSIS — H409 Unspecified glaucoma: Secondary | ICD-10-CM | POA: Diagnosis present

## 2019-05-10 DIAGNOSIS — R54 Age-related physical debility: Secondary | ICD-10-CM | POA: Diagnosis present

## 2019-05-10 DIAGNOSIS — R571 Hypovolemic shock: Secondary | ICD-10-CM | POA: Diagnosis present

## 2019-05-10 DIAGNOSIS — M109 Gout, unspecified: Secondary | ICD-10-CM | POA: Diagnosis present

## 2019-05-10 DIAGNOSIS — E11649 Type 2 diabetes mellitus with hypoglycemia without coma: Secondary | ICD-10-CM | POA: Diagnosis not present

## 2019-05-10 DIAGNOSIS — G510 Bell's palsy: Secondary | ICD-10-CM | POA: Diagnosis not present

## 2019-05-10 DIAGNOSIS — B9562 Methicillin resistant Staphylococcus aureus infection as the cause of diseases classified elsewhere: Secondary | ICD-10-CM | POA: Diagnosis not present

## 2019-05-10 DIAGNOSIS — T80211A Bloodstream infection due to central venous catheter, initial encounter: Secondary | ICD-10-CM | POA: Diagnosis present

## 2019-05-10 DIAGNOSIS — R0682 Tachypnea, not elsewhere classified: Secondary | ICD-10-CM

## 2019-05-10 DIAGNOSIS — N185 Chronic kidney disease, stage 5: Secondary | ICD-10-CM | POA: Diagnosis not present

## 2019-05-10 DIAGNOSIS — R627 Adult failure to thrive: Secondary | ICD-10-CM | POA: Diagnosis present

## 2019-05-10 DIAGNOSIS — E611 Iron deficiency: Secondary | ICD-10-CM | POA: Diagnosis present

## 2019-05-10 DIAGNOSIS — R1314 Dysphagia, pharyngoesophageal phase: Secondary | ICD-10-CM | POA: Diagnosis present

## 2019-05-10 DIAGNOSIS — A419 Sepsis, unspecified organism: Secondary | ICD-10-CM | POA: Diagnosis not present

## 2019-05-10 DIAGNOSIS — I69354 Hemiplegia and hemiparesis following cerebral infarction affecting left non-dominant side: Secondary | ICD-10-CM

## 2019-05-10 DIAGNOSIS — Z7902 Long term (current) use of antithrombotics/antiplatelets: Secondary | ICD-10-CM

## 2019-05-10 DIAGNOSIS — G9341 Metabolic encephalopathy: Secondary | ICD-10-CM | POA: Diagnosis not present

## 2019-05-10 DIAGNOSIS — Z880 Allergy status to penicillin: Secondary | ICD-10-CM

## 2019-05-10 DIAGNOSIS — I2510 Atherosclerotic heart disease of native coronary artery without angina pectoris: Secondary | ICD-10-CM | POA: Diagnosis present

## 2019-05-10 DIAGNOSIS — Z7401 Bed confinement status: Secondary | ICD-10-CM

## 2019-05-10 DIAGNOSIS — G40909 Epilepsy, unspecified, not intractable, without status epilepticus: Secondary | ICD-10-CM | POA: Diagnosis present

## 2019-05-10 DIAGNOSIS — K222 Esophageal obstruction: Secondary | ICD-10-CM | POA: Diagnosis present

## 2019-05-10 DIAGNOSIS — G8194 Hemiplegia, unspecified affecting left nondominant side: Secondary | ICD-10-CM | POA: Diagnosis not present

## 2019-05-10 DIAGNOSIS — Z931 Gastrostomy status: Secondary | ICD-10-CM | POA: Diagnosis not present

## 2019-05-10 DIAGNOSIS — E870 Hyperosmolality and hypernatremia: Secondary | ICD-10-CM | POA: Diagnosis present

## 2019-05-10 DIAGNOSIS — Z90710 Acquired absence of both cervix and uterus: Secondary | ICD-10-CM

## 2019-05-10 DIAGNOSIS — E869 Volume depletion, unspecified: Secondary | ICD-10-CM | POA: Diagnosis present

## 2019-05-10 DIAGNOSIS — F329 Major depressive disorder, single episode, unspecified: Secondary | ICD-10-CM | POA: Diagnosis present

## 2019-05-10 DIAGNOSIS — T827XXA Infection and inflammatory reaction due to other cardiac and vascular devices, implants and grafts, initial encounter: Secondary | ICD-10-CM | POA: Diagnosis not present

## 2019-05-10 DIAGNOSIS — R34 Anuria and oliguria: Secondary | ICD-10-CM | POA: Diagnosis present

## 2019-05-10 DIAGNOSIS — E8809 Other disorders of plasma-protein metabolism, not elsewhere classified: Secondary | ICD-10-CM | POA: Diagnosis not present

## 2019-05-10 DIAGNOSIS — I255 Ischemic cardiomyopathy: Secondary | ICD-10-CM | POA: Diagnosis present

## 2019-05-10 DIAGNOSIS — R0902 Hypoxemia: Secondary | ICD-10-CM

## 2019-05-10 DIAGNOSIS — I959 Hypotension, unspecified: Secondary | ICD-10-CM | POA: Diagnosis not present

## 2019-05-10 DIAGNOSIS — Z7189 Other specified counseling: Secondary | ICD-10-CM | POA: Diagnosis not present

## 2019-05-10 DIAGNOSIS — E785 Hyperlipidemia, unspecified: Secondary | ICD-10-CM | POA: Diagnosis present

## 2019-05-10 DIAGNOSIS — I081 Rheumatic disorders of both mitral and tricuspid valves: Secondary | ICD-10-CM | POA: Diagnosis present

## 2019-05-10 DIAGNOSIS — R0603 Acute respiratory distress: Secondary | ICD-10-CM | POA: Diagnosis not present

## 2019-05-10 LAB — CBC WITH DIFFERENTIAL/PLATELET
Abs Immature Granulocytes: 1.33 10*3/uL — ABNORMAL HIGH (ref 0.00–0.07)
Basophils Absolute: 0 10*3/uL (ref 0.0–0.1)
Basophils Relative: 0 %
Eosinophils Absolute: 0 10*3/uL (ref 0.0–0.5)
Eosinophils Relative: 0 %
HCT: 21.4 % — ABNORMAL LOW (ref 36.0–46.0)
Hemoglobin: 6.9 g/dL — ABNORMAL LOW (ref 12.0–15.0)
Immature Granulocytes: 14 %
Lymphocytes Relative: 3 %
Lymphs Abs: 0.3 10*3/uL — ABNORMAL LOW (ref 0.7–4.0)
MCH: 33.3 pg (ref 26.0–34.0)
MCHC: 32.2 g/dL (ref 30.0–36.0)
MCV: 103.4 fL — ABNORMAL HIGH (ref 80.0–100.0)
Monocytes Absolute: 0.1 10*3/uL (ref 0.1–1.0)
Monocytes Relative: 1 %
Neutro Abs: 7.5 10*3/uL (ref 1.7–7.7)
Neutrophils Relative %: 82 %
Platelets: 82 10*3/uL — ABNORMAL LOW (ref 150–400)
RBC: 2.07 MIL/uL — ABNORMAL LOW (ref 3.87–5.11)
RDW: 18.1 % — ABNORMAL HIGH (ref 11.5–15.5)
Smear Review: DECREASED
WBC: 9.4 10*3/uL (ref 4.0–10.5)
nRBC: 0 % (ref 0.0–0.2)

## 2019-05-10 LAB — BLOOD GAS, VENOUS
Acid-base deficit: 8.1 mmol/L — ABNORMAL HIGH (ref 0.0–2.0)
Bicarbonate: 17.8 mmol/L — ABNORMAL LOW (ref 20.0–28.0)
O2 Saturation: 75.8 %
Patient temperature: 37
pCO2, Ven: 37 mmHg — ABNORMAL LOW (ref 44.0–60.0)
pH, Ven: 7.29 (ref 7.250–7.430)
pO2, Ven: 46 mmHg — ABNORMAL HIGH (ref 32.0–45.0)

## 2019-05-10 LAB — TSH: TSH: 0.594 u[IU]/mL (ref 0.350–4.500)

## 2019-05-10 LAB — T4, FREE: Free T4: 1.17 ng/dL — ABNORMAL HIGH (ref 0.61–1.12)

## 2019-05-10 LAB — AMMONIA: Ammonia: 13 umol/L (ref 9–35)

## 2019-05-10 LAB — COMPREHENSIVE METABOLIC PANEL
ALT: 14 U/L (ref 0–44)
AST: 26 U/L (ref 15–41)
Albumin: 1.3 g/dL — ABNORMAL LOW (ref 3.5–5.0)
Alkaline Phosphatase: 55 U/L (ref 38–126)
Anion gap: 12 (ref 5–15)
BUN: 20 mg/dL (ref 8–23)
CO2: 20 mmol/L — ABNORMAL LOW (ref 22–32)
Calcium: 6.7 mg/dL — ABNORMAL LOW (ref 8.9–10.3)
Chloride: 107 mmol/L (ref 98–111)
Creatinine, Ser: 1.68 mg/dL — ABNORMAL HIGH (ref 0.44–1.00)
GFR calc Af Amer: 34 mL/min — ABNORMAL LOW (ref >60)
GFR calc non Af Amer: 29 mL/min — ABNORMAL LOW (ref >60)
Glucose, Bld: 137 mg/dL — ABNORMAL HIGH (ref 70–99)
Potassium: 2.1 mmol/L — CL (ref 3.5–5.1)
Sodium: 139 mmol/L (ref 135–145)
Total Bilirubin: 1.5 mg/dL — ABNORMAL HIGH (ref 0.3–1.2)
Total Protein: 3.9 g/dL — ABNORMAL LOW (ref 6.5–8.1)

## 2019-05-10 LAB — LACTIC ACID, PLASMA: Lactic Acid, Venous: 1.9 mmol/L (ref 0.5–1.9)

## 2019-05-10 LAB — IRON AND TIBC
Iron: 6 ug/dL — ABNORMAL LOW (ref 28–170)
Saturation Ratios: 6 % — ABNORMAL LOW (ref 10.4–31.8)
TIBC: 101 ug/dL — ABNORMAL LOW (ref 250–450)
UIBC: 95 ug/dL

## 2019-05-10 LAB — URINE DRUG SCREEN, QUALITATIVE (ARMC ONLY)
Amphetamines, Ur Screen: NOT DETECTED
Barbiturates, Ur Screen: NOT DETECTED
Benzodiazepine, Ur Scrn: NOT DETECTED
Cannabinoid 50 Ng, Ur ~~LOC~~: NOT DETECTED
Cocaine Metabolite,Ur ~~LOC~~: NOT DETECTED
MDMA (Ecstasy)Ur Screen: NOT DETECTED
Methadone Scn, Ur: NOT DETECTED
Opiate, Ur Screen: NOT DETECTED
Phencyclidine (PCP) Ur S: NOT DETECTED
Tricyclic, Ur Screen: NOT DETECTED

## 2019-05-10 LAB — BASIC METABOLIC PANEL
Anion gap: 18 — ABNORMAL HIGH (ref 5–15)
BUN: 25 mg/dL — ABNORMAL HIGH (ref 8–23)
CO2: 15 mmol/L — ABNORMAL LOW (ref 22–32)
Calcium: 7.9 mg/dL — ABNORMAL LOW (ref 8.9–10.3)
Chloride: 103 mmol/L (ref 98–111)
Creatinine, Ser: 2.04 mg/dL — ABNORMAL HIGH (ref 0.44–1.00)
GFR calc Af Amer: 27 mL/min — ABNORMAL LOW (ref >60)
GFR calc non Af Amer: 23 mL/min — ABNORMAL LOW (ref >60)
Glucose, Bld: 202 mg/dL — ABNORMAL HIGH (ref 70–99)
Potassium: 3.5 mmol/L (ref 3.5–5.1)
Sodium: 136 mmol/L (ref 135–145)

## 2019-05-10 LAB — CK: Total CK: 17 U/L — ABNORMAL LOW (ref 38–234)

## 2019-05-10 LAB — FERRITIN: Ferritin: 399 ng/mL — ABNORMAL HIGH (ref 11–307)

## 2019-05-10 LAB — RESPIRATORY PANEL BY RT PCR (FLU A&B, COVID)
Influenza A by PCR: NEGATIVE
Influenza B by PCR: NEGATIVE
SARS Coronavirus 2 by RT PCR: NEGATIVE

## 2019-05-10 LAB — MAGNESIUM: Magnesium: 1.6 mg/dL — ABNORMAL LOW (ref 1.7–2.4)

## 2019-05-10 LAB — FOLATE: Folate: 1.9 ng/mL — ABNORMAL LOW (ref >5.9)

## 2019-05-10 LAB — IMMATURE PLATELET FRACTION: Immature Platelet Fraction: 2.1 % (ref 1.2–8.6)

## 2019-05-10 LAB — MRSA PCR SCREENING: MRSA by PCR: NEGATIVE

## 2019-05-10 LAB — ETHANOL: Alcohol, Ethyl (B): <10 mg/dL (ref <10)

## 2019-05-10 LAB — PHOSPHORUS: Phosphorus: 1.6 mg/dL — ABNORMAL LOW (ref 2.5–4.6)

## 2019-05-10 LAB — TROPONIN I (HIGH SENSITIVITY)
Troponin I (High Sensitivity): 66 ng/L — ABNORMAL HIGH (ref <18)
Troponin I (High Sensitivity): 72 ng/L — ABNORMAL HIGH (ref <18)

## 2019-05-10 LAB — BRAIN NATRIURETIC PEPTIDE: B Natriuretic Peptide: 1546 pg/mL — ABNORMAL HIGH (ref 0.0–100.0)

## 2019-05-10 LAB — ABO/RH: ABO/RH(D): O POS

## 2019-05-10 LAB — LIPASE, BLOOD: Lipase: <10 U/L — ABNORMAL LOW (ref 11–51)

## 2019-05-10 LAB — PROCALCITONIN: Procalcitonin: 62.53 ng/mL

## 2019-05-10 MED ORDER — SODIUM CHLORIDE 0.9 % IV SOLN: INTRAVENOUS | Status: DC

## 2019-05-10 MED ORDER — NOREPINEPHRINE 4 MG/250ML-% IV SOLN
2.0000 ug/min | INTRAVENOUS | Status: DC
  Filled 2019-05-10: qty 250

## 2019-05-10 MED ORDER — ALBUMIN HUMAN 25 % IV SOLN
25.0000 g | Freq: Two times a day (BID) | INTRAVENOUS | Status: AC
  Administered 2019-05-10: 22:00:00 25 g via INTRAVENOUS
  Administered 2019-05-11: 12.5 g via INTRAVENOUS
  Administered 2019-05-11 – 2019-05-13 (×4): 25 g via INTRAVENOUS
  Filled 2019-05-10 (×6): qty 100

## 2019-05-10 MED ORDER — VANCOMYCIN HCL 500 MG/100ML IV SOLN: 500.0000 mg | INTRAVENOUS | Status: DC

## 2019-05-10 MED ORDER — STERILE WATER FOR INJECTION IV SOLN
INTRAVENOUS | Status: DC
  Filled 2019-05-10 (×4): qty 850

## 2019-05-10 MED ORDER — POTASSIUM CHLORIDE 10 MEQ/100ML IV SOLN
10.0000 meq | INTRAVENOUS | Status: DC
  Administered 2019-05-10 (×2): 10 meq via INTRAVENOUS
  Filled 2019-05-10 (×2): qty 100

## 2019-05-10 MED ORDER — LACTATED RINGERS IV BOLUS
1000.0000 mL | Freq: Once | INTRAVENOUS | Status: AC
  Administered 2019-05-10: 1000 mL via INTRAVENOUS

## 2019-05-10 MED ORDER — NOREPINEPHRINE 16 MG/250ML-% IV SOLN
0.0000 ug/min | INTRAVENOUS | Status: DC
  Filled 2019-05-10: qty 250

## 2019-05-10 MED ORDER — MAGNESIUM SULFATE 2 GM/50ML IV SOLN
2.0000 g | Freq: Once | INTRAVENOUS | Status: AC
  Administered 2019-05-10: 2 g via INTRAVENOUS
  Filled 2019-05-10: qty 50

## 2019-05-10 MED ORDER — SODIUM CHLORIDE 0.9 % IV BOLUS
1000.0000 mL | Freq: Once | INTRAVENOUS | Status: AC
  Administered 2019-05-10: 1000 mL via INTRAVENOUS

## 2019-05-10 MED ORDER — NOREPINEPHRINE 4 MG/250ML-% IV SOLN
0.0000 ug/min | INTRAVENOUS | Status: DC
  Administered 2019-05-10: 17 ug/min via INTRAVENOUS
  Filled 2019-05-10: qty 250

## 2019-05-10 MED ORDER — POTASSIUM CHLORIDE 10 MEQ/100ML IV SOLN
10.0000 meq | INTRAVENOUS | Status: AC
  Administered 2019-05-10 (×2): 10 meq via INTRAVENOUS
  Filled 2019-05-10 (×2): qty 100

## 2019-05-10 MED ORDER — VANCOMYCIN HCL 1250 MG/250ML IV SOLN
1250.0000 mg | Freq: Once | INTRAVENOUS | Status: AC
  Administered 2019-05-10: 1250 mg via INTRAVENOUS
  Filled 2019-05-10: qty 250

## 2019-05-10 MED ORDER — CHLORHEXIDINE GLUCONATE CLOTH 2 % EX PADS
6.0000 | MEDICATED_PAD | Freq: Every day | CUTANEOUS | Status: DC
  Administered 2019-05-10 – 2019-05-21 (×9): 6 via TOPICAL

## 2019-05-10 MED ORDER — SODIUM CHLORIDE 0.9 % IV SOLN: 2.0000 g | Freq: Once | INTRAVENOUS | Status: DC

## 2019-05-10 MED ORDER — POTASSIUM PHOSPHATES 15 MMOLE/5ML IV SOLN
30.0000 mmol | Freq: Once | INTRAVENOUS | Status: AC
  Administered 2019-05-11: 01:00:00 30 mmol via INTRAVENOUS
  Filled 2019-05-10: qty 10

## 2019-05-10 MED ORDER — AZTREONAM 2 G IJ SOLR
2.0000 g | Freq: Once | INTRAMUSCULAR | Status: DC
  Filled 2019-05-10: qty 2

## 2019-05-10 MED ORDER — NOREPINEPHRINE 4 MG/250ML-% IV SOLN
INTRAVENOUS | Status: AC
  Administered 2019-05-10: 20 ug/min via INTRAVENOUS
  Administered 2019-05-10: 14:00:00 9 ug/min via INTRAVENOUS
  Filled 2019-05-10: qty 250

## 2019-05-10 MED ORDER — POTASSIUM CHLORIDE 10 MEQ/100ML IV SOLN
10.0000 meq | INTRAVENOUS | Status: DC
  Filled 2019-05-10 (×6): qty 100

## 2019-05-10 MED ORDER — NOREPINEPHRINE 16 MG/250ML-% IV SOLN
0.0000 ug/min | INTRAVENOUS | Status: DC
  Administered 2019-05-10: 38 ug/min via INTRAVENOUS
  Administered 2019-05-11: 10 ug/min via INTRAVENOUS
  Administered 2019-05-13: 3 ug/min via INTRAVENOUS
  Filled 2019-05-10 (×4): qty 250

## 2019-05-10 MED ORDER — INSULIN ASPART 100 UNIT/ML ~~LOC~~ SOLN
0.0000 [IU] | SUBCUTANEOUS | Status: DC
  Administered 2019-05-11: 2 [IU] via SUBCUTANEOUS
  Administered 2019-05-11 (×2): 3 [IU] via SUBCUTANEOUS
  Administered 2019-05-11: 2 [IU] via SUBCUTANEOUS
  Administered 2019-05-11: 1 [IU] via SUBCUTANEOUS
  Administered 2019-05-12 (×2): 2 [IU] via SUBCUTANEOUS
  Administered 2019-05-12 – 2019-05-13 (×3): 1 [IU] via SUBCUTANEOUS
  Administered 2019-05-13: 2 [IU] via SUBCUTANEOUS
  Administered 2019-05-13: 20:00:00 1 [IU] via SUBCUTANEOUS
  Administered 2019-05-14: 2 [IU] via SUBCUTANEOUS
  Administered 2019-05-14: 08:00:00 1 [IU] via SUBCUTANEOUS
  Administered 2019-05-14: 2 [IU] via SUBCUTANEOUS
  Administered 2019-05-15: 1 [IU] via SUBCUTANEOUS
  Administered 2019-05-15 – 2019-05-16 (×2): 2 [IU] via SUBCUTANEOUS
  Administered 2019-05-16: 04:00:00 3 [IU] via SUBCUTANEOUS
  Administered 2019-05-16 (×2): 2 [IU] via SUBCUTANEOUS
  Administered 2019-05-17: 5 [IU] via SUBCUTANEOUS
  Administered 2019-05-17: 2 [IU] via SUBCUTANEOUS
  Administered 2019-05-18 (×2): 1 [IU] via SUBCUTANEOUS
  Administered 2019-05-18: 3 [IU] via SUBCUTANEOUS
  Administered 2019-05-19 (×2): 1 [IU] via SUBCUTANEOUS
  Administered 2019-05-20: 5 [IU] via SUBCUTANEOUS
  Administered 2019-05-20 – 2019-05-21 (×2): 1 [IU] via SUBCUTANEOUS
  Filled 2019-05-10 (×31): qty 1

## 2019-05-10 MED ORDER — SODIUM CHLORIDE 0.9 % IV SOLN
1.0000 g | INTRAVENOUS | Status: DC
  Administered 2019-05-10: 1 g via INTRAVENOUS
  Filled 2019-05-10 (×2): qty 1

## 2019-05-10 MED ORDER — VANCOMYCIN HCL 750 MG/150ML IV SOLN
750.0000 mg | INTRAVENOUS | Status: DC
  Filled 2019-05-10: qty 150

## 2019-05-10 MED ORDER — SODIUM CHLORIDE 0.9 % IV SOLN
10.0000 mL/h | Freq: Once | INTRAVENOUS | Status: AC
  Administered 2019-05-10: 10 mL/h via INTRAVENOUS

## 2019-05-10 NOTE — ED Provider Notes (Signed)
[facility] Emergency Department Provider Note  ____________________________________________   First MD Initiated Contact with Patient 05/18/2019 1226     (approximate)  I have reviewed the triage vital signs and the nursing notes.   HISTORY  Chief Complaint Code Sepsis    HPI Crystal Vargas is a 73 y.o. female with CKD recently started on dialysis in January comes in for altered mental status.  According to facility patient is normally alert and oriented verbal but is bedbound secondary to stroke.  Patient also gets dialysis Monday Wednesday Fridays and was last dialyzed on Friday, yesterday.  Patient has some difficulty with swallowing and that they are following up with a doctor to see if her esophagus just stretched again.  It sounds like initially we were told by EMS that she is had a progressive decline over 2 weeks but when I called the facility they stated that this was acute change that happened today.  Patient was noted to be hypotensive and not very responsive therefore unable to get full HPI.          Past Medical History:  Diagnosis Date  . Chronic kidney disease    RENAL INSUFFICIENCY  . Coronary artery disease   . Depression   . Diabetes mellitus without complication ([place])   . GERD (gastroesophageal reflux disease)   . Glaucoma   . Hyperlipidemia   . Hypertension   . Stroke [facility])     Patient Active Problem List   Diagnosis Date Noted  . Diabetes ([place]) 04/15/2019  . Essential hypertension 04/15/2019  . ESRD on dialysis [facility] ) 04/15/2019    Past Surgical History:  Procedure Laterality Date  . ABDOMINAL HYSTERECTOMY    . ESOPHAGOGASTRODUODENOSCOPY (EGD) WITH PROPOFOL N/A 01/21/2018   Procedure: ESOPHAGOGASTRODUODENOSCOPY (EGD) WITH PROPOFOL;  Surgeon: Manya Silvas, MD;  Location: [facility] ENDOSCOPY;  Service: Endoscopy;  Laterality: N/A;    Prior to Admission medications   Medication Sig Start Date End Date Taking? Authorizing  Provider  acetaminophen (TYLENOL) 325 MG tablet Take 650 mg by mouth 3 (three) times daily.    Yes [provider]  albuterol (PROVENTIL) (2.5 MG/3ML) 0.083% nebulizer solution Take 2.5 mg by nebulization 2 (two) times daily as needed for wheezing or shortness of breath.   Yes [provider]  allopurinol (ZYLOPRIM) 100 MG tablet Take 100 mg by mouth every evening.    Yes [provider]  amLODipine (NORVASC) 10 MG tablet Take 10 mg by mouth every evening.    Yes [provider]  atorvastatin (LIPITOR) 10 MG tablet Take 10 mg by mouth every evening.    Yes [provider]  brimonidine-timolol (COMBIGAN) 0.2-0.5 % ophthalmic solution Place 1 drop into both eyes every 12 (twelve) hours.   Yes [provider]  carvedilol (COREG) 25 MG tablet Take 25 mg by mouth See admin instructions. Take 25 mg twice daily on Sunday, Tuesday, Thursday, Saturday   Yes [provider]  carvedilol (COREG) 25 MG tablet Take 25 mg by mouth See admin instructions. Take 25 mg in the evening on Monday, Wednesday, Friday   Yes [provider]  cholecalciferol (VITAMIN D3) 25 MCG (1000 UNIT) tablet Take 1,000 Units by mouth daily.   Yes [provider]  clopidogrel (PLAVIX) 75 MG tablet Take 75 mg by mouth every evening.    Yes [provider]  diclofenac Sodium (VOLTAREN) 1 % GEL Apply 2 g topically in the morning and at bedtime. To neck  Yes [provider]  guaiFENesin (ROBITUSSIN) 100 MG/5ML SOLN Take 5 mLs by mouth at bedtime as needed for cough or to loosen phlegm.   Yes [provider]  hydrALAZINE (APRESOLINE) 25 MG tablet Take 25 mg by mouth See admin instructions. Take 25 mg three times daily on Sun, Tue, Thu, Sat, give twice daily on Mon, Wed, Fri after dialysis   Yes [provider]  isosorbide dinitrate (ISORDIL) 10 MG tablet Take 10 mg by mouth 3 (three) times daily.   Yes [provider]   latanoprost (XALATAN) 0.005 % ophthalmic solution Place 1 drop into both eyes every evening.    Yes [provider]  levETIRAcetam (KEPPRA) 1000 MG tablet Take 1,000 mg by mouth every evening.   Yes [provider]  lisinopril (ZESTRIL) 2.5 MG tablet Take 2.5 mg by mouth every evening.    Yes [provider]  melatonin 3 MG TABS tablet Take 3 mg by mouth at bedtime.   Yes [provider]  midodrine (PROAMATINE) 10 MG tablet Take 10 mg by mouth every Monday, Wednesday, and Friday.   Yes [provider]  mirtazapine (REMERON) 7.5 MG tablet Take 7.5 mg by mouth at bedtime.   Yes [provider]  pantoprazole (PROTONIX) 40 MG tablet Take 40 mg by mouth every evening.    Yes [provider]  polyethylene glycol (MIRALAX / GLYCOLAX) 17 g packet Take 17 g by mouth daily.   Yes [provider]  Propylene Glycol-Glycerin (ARTIFICIAL TEARS) 1-0.3 % SOLN Place 2 drops into both eyes in the morning and at bedtime.   Yes [provider]  sertraline (ZOLOFT) 50 MG tablet Take 50 mg by mouth daily.   Yes [provider]  Wheat Dextrin (BENEFIBER DRINK MIX PO) Take 1 Scoop by mouth daily.   Yes [provider]    Allergies Abacavir, Cephalosporins, Ciprofloxacin, Nsaids, Penicillin g, Quinolones, and Salicylates  History reviewed. No pertinent family history.  Social History Social History   Tobacco Use  . Smoking status: Never Smoker  . Smokeless tobacco: Never Used  Substance Use Topics  . Alcohol use: Not Currently  . Drug use: Never      Review of Systems Unable to get full review of system due to altered mental status ____________________________________________   PHYSICAL EXAM:  VITAL SIGNS: ED Triage Vitals  Enc Vitals Group     BP 05/24/2019 1230 (!) 63/41     Pulse Rate 05/29/2019 1230 77     Resp 05/17/2019 1230 17     Temp 05/30/2019 1407 (S) 98.1 F (36.7 C)     Temp Source 05/23/2019  1407 (S) Rectal     SpO2 06/02/2019 1230 100 %     Weight 05/16/2019 1237 143 lb 11.8 oz (65.2 kg)     Height 05/25/2019 1237 5\' 2"  (1.575 m)     Head Circumference --      Peak Flow --      Pain Score --      Pain Loc --      Pain Edu? --      Excl. in [place]? --     Constitutional: Altered, moaning Eyes: Conjunctivae are normal.  Blinks  Head: Atraumatic. Nose: No congestion/rhinnorhea. Mouth/Throat: Mucous membranes are moist.   Neck: No stridor. Trachea Midline. FROM Cardiovascular: Normal rate, regular rhythm. Grossly normal heart sounds.  Good peripheral circulation. Respiratory: Normal respiratory effort.  No retractions. Lungs CTAB. Gastrointestinal: Soft and nontender. No distention.  No abdominal bruits.  Musculoskeletal: No lower extremity tenderness nor edema.  No joint effusions. Neurologic: Patient appears to be slightly altered but does moan to pain and will blink  Skin:  Skin is warm, dry and intact. No rash noted. Psychiatric: Unable to fully assess GU: Deferred   ____________________________________________   LABS (all labs ordered are listed, but only abnormal results are displayed)  Labs Reviewed  COMPREHENSIVE METABOLIC PANEL - Abnormal; Notable for the following components:      Result Value   Potassium 2.1 (*)    CO2 20 (*)    Glucose, Bld 137 (*)    Creatinine, Ser 1.68 (*)    Calcium 6.7 (*)    Total Protein 3.9 (*)    Albumin 1.3 (*)    Total Bilirubin 1.5 (*)    GFR calc non Af Amer 29 (*)    GFR calc Af Amer 34 (*)    All other components within normal limits  LIPASE, BLOOD - Abnormal; Notable for the following components:   Lipase <10 (*)    All other components within normal limits  BLOOD GAS, VENOUS - Abnormal; Notable for the following components:   pCO2, Ven 37 (*)    pO2, Ven 46.0 (*)    Bicarbonate 17.8 (*)    Acid-base deficit 8.1 (*)    All other components within normal limits  CK - Abnormal; Notable for the following components:    Total CK 17 (*)    All other components within normal limits  T4, FREE - Abnormal; Notable for the following components:   Free T4 1.17 (*)    All other components within normal limits  CBC WITH DIFFERENTIAL/PLATELET - Abnormal; Notable for the following components:   RBC 2.07 (*)    Hemoglobin 6.9 (*)    HCT 21.4 (*)    MCV 103.4 (*)    RDW 18.1 (*)    Platelets 82 (*)    Lymphs Abs 0.3 (*)    Abs Immature Granulocytes 1.33 (*)    All other components within normal limits  TROPONIN I (HIGH SENSITIVITY) - Abnormal; Notable for the following components:   Troponin I (High Sensitivity) 66 (*)    All other components within normal limits  URINE CULTURE  CULTURE, BLOOD (ROUTINE X 2)  CULTURE, BLOOD (ROUTINE X 2)  RESPIRATORY PANEL BY RT PCR (FLU A&B, COVID)  AMMONIA  ETHANOL  TSH  LACTIC ACID, PLASMA  PROCALCITONIN  CBC WITH DIFFERENTIAL/PLATELET  URINE DRUG SCREEN, QUALITATIVE ([facility] ONLY)  URINALYSIS, COMPLETE (UACMP) WITH MICROSCOPIC  BRAIN NATRIURETIC PEPTIDE  MAGNESIUM  PREPARE RBC (CROSSMATCH)  TROPONIN I (HIGH SENSITIVITY)   ____________________________________________   ED ECG REPORT I, Vanessa Owen, the attending physician, personally viewed and interpreted this ECG.  EKG is sinus rate of 73, no ST elevation, T wave inversions in 2 3 aVF, V2 through V6 ____________________________________________  RADIOLOGY I, Vanessa Dousman, personally viewed and evaluated these images (plain radiographs) as part of my medical decision making, as well as reviewing the written report by the radiologist.  ED MD interpretation: X-ray reviewed and no effusions noted.  Official radiology report(s): CT Head Wo Contrast  Result Date: 05/25/2019 CLINICAL DATA:  Headache, head trauma. Altered mental status EXAM: CT HEAD WITHOUT CONTRAST TECHNIQUE: Contiguous axial images were obtained from the base of the skull through the vertex without intravenous contrast. COMPARISON:  None. FINDINGS:  Brain: No acute hemorrhage. No subdural or extra-axial collection. Large chronic right MCA distribution encephalomalacia.  Associated ex vacuo dilatation of the right lateral ventricle. Background periventricular chronic small vessel ischemia. No evidence of acute infarct. Vascular: Atherosclerosis of skullbase vasculature without hyperdense vessel or abnormal calcification. Skull: No fracture or focal lesion. Sinuses/Orbits: Paranasal sinuses and mastoid air cells are clear. The visualized orbits are unremarkable. Other: None. IMPRESSION: 1. No acute intracranial abnormality. 2. Large remote right MCA distribution infarct with ex vacuo dilatation of the right lateral ventricle. Background chronic small vessel ischemia. Electronically Signed   By: Keith Rake M.D.   On: 05/31/2019 15:09   CT Cervical Spine Wo Contrast  Result Date: 05/14/2019 CLINICAL DATA:  Polytrauma, critical, head/C-spine injury suspected EXAM: CT CERVICAL SPINE WITHOUT CONTRAST TECHNIQUE: Multidetector CT imaging of the cervical spine was performed without intravenous contrast. Multiplanar CT image reconstructions were also generated. COMPARISON:  None. FINDINGS: Alignment: No evidence of traumatic subluxation. Trace anterolisthesis of C6 on C7 is likely degenerative and facet mediated. Skull base and vertebrae: No acute fracture. Vertebral body heights are maintained. The dens and skull base are intact. Soft tissues and spinal canal: No prevertebral fluid or swelling. No visible canal hematoma. Disc levels: Multilevel endplate spurring with mild disc space narrowing at C5-C6 and C6-C7. Multilevel facet hypertrophy. Upper chest: Right internal jugular dialysis catheter in place. Patulous esophagus containing intraluminal fluid/debris. Minimal patchy opacity in the perifissural left upper lobe, partially included. Other: Carotid calcifications. IMPRESSION: 1. Degenerative change in the cervical spine without acute fracture or subluxation.  2. Patulous esophagus containing intraluminal fluid/debris. Electronically Signed   By: Keith Rake M.D.   On: 06/04/2019 15:13   DG Chest Portable 1 View  Result Date: 05/09/2019 CLINICAL DATA:  Shortness of breath EXAM: PORTABLE CHEST 1 VIEW COMPARISON:  None. FINDINGS: The heart size and mediastinal contours are within normal limits. Large-bore right neck multi lumen vascular catheter. Both lungs are clear. The visualized skeletal structures are unremarkable. IMPRESSION: No acute abnormality of the lungs in AP portable projection. Electronically Signed   By: Eddie Candle M.D.   On: 05/23/2019 15:13    ____________________________________________   PROCEDURES  Procedure(s) performed (including Critical Care):  .Critical Care Performed by: Vanessa [place], MD Authorized by: Vanessa [place], MD   Critical care provider statement:    Critical care time (minutes):  45   Critical care was necessary to treat or prevent imminent or life-threatening deterioration of the following conditions:  Shock   Critical care was time spent personally by me on the following activities:  Discussions with consultants, evaluation of patient's response to treatment, examination of patient, ordering and performing treatments and interventions, ordering and review of laboratory studies, ordering and review of radiographic studies, pulse oximetry, re-evaluation of patient's condition, obtaining history from patient or surrogate and review of old charts Ultrasound ED Peripheral IV (Provider)  Date/Time: 05/14/2019 2:24 PM Performed by: Vanessa [place], MD Authorized by: Vanessa , MD   Procedure details:    Indications: hydration     Skin Prep: isopropyl alcohol     Location:  Right AC   Angiocath:  18 G   Bedside Ultrasound Guided: Yes     Images: not archived     Patient tolerated procedure without complications: Yes     Dressing applied: Yes        ____________________________________________   INITIAL IMPRESSION / ASSESSMENT AND PLAN / ED COURSE  Crystal Vargas was evaluated in Emergency Department on 05/12/2019 for the symptoms described in the history of present illness. She was  evaluated in the context of the global COVID-19 pandemic, which necessitated consideration that the patient might be at risk for infection with the SARS-CoV-2 virus that causes COVID-19. Institutional protocols and algorithms that pertain to the evaluation of patients at risk for COVID-19 are in a state of rapid change based on information released by regulatory bodies including the CDC and federal and state organizations. These policies and algorithms were followed during the patient's care in the ED.     Patient is a 74 year old who comes in with altered mental status and low blood pressures.  Will start fluid resuscitation.  Will cover with antibiotics given she is dialysis in case this is sepsis related.  Bedside ultrasound did show IVC was compressible so I do not think this is secondary to cardiogenic shock.  Does not seem to be short of breath to suggest PE.  Will get labs to evaluate for electrolyte abnormalities, AKI.  Patient given 1 L of fluids and became more responsive although blood pressures seem a little bit lower.  Patient was started on Levophed we will give an additional liter of fluid.   Attempted to call daughter to update but unable to get a response.  Labs are coming back reassuring with normal lactate.  Patient was started on broad-spectrum antibiotics but her sepsis work-up is so far reassuring.  Patient is received 2 L of fluids and is on Levophed.  Mental status is improving although not at baseline.  Patient still requiring Levophed.  ICU Dr. Patsey Berthold was at bedside to further evaluate patient.  Respiratory status remains looking good on her baseline oxygen with clear lungs.  Will give 3rd L of fluid.  We will also give patient 1 unit  of blood due to her low blood levels.  Patient's had prior low blood levels in the past most likely secondary to ESRD.  Due to patient having this esophageal problem family is stated that she has not been eating and drinking as well and I suspect this in combination with dialysis has made her hypotensive.  Patient is on midodrine Monday Wednesday Fridays during dialysis.  Patient is not on any steroids to suggest cortisol issue.  Patient will be admitted to the ICU.  If there is no beds Dr. Bland Span may decide to transfer pt.      ____________________________________________   FINAL CLINICAL IMPRESSION(S) / ED DIAGNOSES   Final diagnoses:  Shock ([place])  Hypotension, unspecified hypotension type  ESRD (end stage renal disease) ([place])  Hypokalemia  Anemia, unspecified type      MEDICATIONS GIVEN DURING THIS VISIT:  Medications  vancomycin (VANCOREADY) IVPB 1250 mg/250 mL (1,250 mg Intravenous New Bag/Given 05/31/2019 1439)  aztreonam (AZACTAM) 2 g in sodium chloride 0.9 % 100 mL IVPB (has no administration in time range)  potassium chloride 10 mEq in 100 mL IVPB (10 mEq Intravenous New Bag/Given 05/25/2019 1547)  lactated ringers bolus 1,000 mL (has no administration in time range)  0.9 %  sodium chloride infusion (has no administration in time range)  sodium chloride 0.9 % bolus 1,000 mL (0 mLs Intravenous Stopped 05/26/2019 1404)  norepinephrine (LEVOPHED) 4-5 MG/250ML-% infusion SOLN (15 mcg/min  Rate/Dose Change 05/23/2019 1449)     ED Discharge Orders    None       Note:  This document was prepared using Dragon voice recognition software and may include unintentional dictation errors.   Vanessa [place], MD 05/09/2019 [phone]

## 2019-05-10 NOTE — ED Notes (Signed)
EDP @ bedside 

## 2019-05-10 NOTE — ED Notes (Signed)
RN has informed MD on potassium level of 2.1.

## 2019-05-10 NOTE — Consult Note (Signed)
PHARMACY CONSULT NOTE - FOLLOW UP  Pharmacy Consult for Electrolyte Monitoring and Replacement   Recent Labs: Potassium (mmol/L)  Date Value  06/04/2019 3.5   Magnesium (mg/dL)  Date Value  05/09/2019 1.6 (L)   Calcium (mg/dL)  Date Value  06/01/2019 7.9 (L)   Albumin (g/dL)  Date Value  05/19/2019 1.3 (L)   Phosphorus (mg/dL)  Date Value  06/02/2019 1.6 (L)   Sodium (mmol/L)  Date Value  06/05/2019 136  Corrected calcium: 8.9 mg/dL   Assessment: Patient is a 74 y/o F with a history of ESRD on HD, diabetes, seizures, CVA who is admitted with sepsis. Patient was last dialyzed Friday (4/2).   ABG with pH 7.29. Labs significant for hypokalemia with a K of 2.1 and hypomagnesemia with Mg 1.6.  Patient is currently NPO. Peripheral IV access only at this time pending possible central line placement. Potassium 10 mEq IV x 2 given in the ED. 2L fluid boluses ordered.   Per chart several labs were hemolyzed. Originally ordered Kcl 10 mEq x 6. Per chart: RN discussed with MD to give 2/6 ordered bags and only administer remaining 4 after BMP is re-drawn. Will re-order KCl 10 mEq x 2, d/c original order, and follow-up potassium on BMP.   Goal of Therapy:  Electrolytes within normal limits  Plan:  -Will order magnesium 2 g IV x 1 and potassium 10 mEq IV x 2 -Re-check potassium at 2200 -Electrolytes (K, Mg, Phos) tomorrow AM  0403 2135 K+ = 3.5, WNL, Phos = 1.6 > KPhos 59mMols ordered.  Will f/u K+ and Phos in am  Hart Robinsons A 05/09/2019 10:36 PM

## 2019-05-10 NOTE — Consult Note (Signed)
Pharmacy Antibiotic Note  Crystal Vargas is a 74 y.o. female admitted on 05/21/2019 with sepsis.  Pharmacy has been consulted for Vancomycin and Aztreonam dosing. Patient receives hemodialysis on Monday, Wednesday and Fridays, with the last session being yesterday.  Plan: 1) Patient received Vancomycin 1250mg  IV x 1 as the loading dose. Will order Vancomycin 750mg  IV to be given on days that the patient receives dialysis(currently ordered for Mon/Wedn/Friday).   Will follow up with nephrologist to see what inpatient plan may be.  2) Azactam 1g Q24 hours  Height: 5\' 2"  (157.5 cm) Weight: 61.8 kg (136 lb 3.9 oz) IBW/kg (Calculated) : 50.1  Temp (24hrs), Avg:98 F (36.7 C), Min:97.9 F (36.6 C), Max:98.1 F (36.7 C)  Recent Labs  Lab 05/09/2019 1324  WBC 9.4  CREATININE 1.68*  LATICACIDVEN 1.9    Estimated Creatinine Clearance: 24.6 mL/min (A) (by C-G formula based on SCr of 1.68 mg/dL (H)).    Allergies  Allergen Reactions  . Abacavir Other (See Comments)  . Cephalosporins Other (See Comments)  . Ciprofloxacin Other (See Comments)  . Nsaids Other (See Comments)  . Penicillin G Other (See Comments)  . Quinolones Other (See Comments)  . Salicylates Other (See Comments)    Antimicrobials this admission: Vancomycin 4/3 >>  Azactam 4/3 >>   Microbiology results: 4/3 BCx: pending 4/3 UCx: pending  4/3 MRSA PCR: negative  Thank you for allowing pharmacy to be a part of this patient's care.  [name] 06/05/2019 8:18 PM

## 2019-05-10 NOTE — ED Notes (Signed)
IV team at bedside 

## 2019-05-10 NOTE — ED Notes (Signed)
Type and screen and repeat troponin sent to lab

## 2019-05-10 NOTE — Consult Note (Signed)
PHARMACY -  BRIEF ANTIBIOTIC NOTE   Pharmacy has received consult(s) for vancomycin from an ED provider. Patient is also ordered aztreonam. The patient's profile has been reviewed for ht/wt/allergies/indication/available labs.    Patient with allergies listed to many antibiotic classes including: penicillins, cephalosporins, and fluoroquinolones. Reaction not documented. Further investigation warranted.   One time order(s) placed for -Vancomycin 1.25 g x 1 (already ordered)  Further antibiotics/pharmacy consults should be ordered by admitting physician if indicated.                       Thank you, [place] Resident 05/23/2019  2:38 PM

## 2019-05-10 NOTE — Consult Note (Signed)
PHARMACY CONSULT NOTE - FOLLOW UP  Pharmacy Consult for Electrolyte Monitoring and Replacement   Recent Labs: Potassium (mmol/L)  Date Value  05/25/2019 3.5   Magnesium (mg/dL)  Date Value  05/16/2019 1.6 (L)   Calcium (mg/dL)  Date Value  05/08/2019 7.9 (L)   Albumin (g/dL)  Date Value  06/06/2019 1.3 (L)   Phosphorus (mg/dL)  Date Value  06/04/2019 1.6 (L)   Sodium (mmol/L)  Date Value  06/04/2019 136  Corrected calcium: 8.9 mg/dL   Assessment: Patient is a 74 y/o F with a history of ESRD on HD, diabetes, seizures, CVA who is admitted with sepsis. Patient was last dialyzed Friday (4/2).   ABG with pH 7.29. Labs significant for hypokalemia with a K of 2.1 and hypomagnesemia with Mg 1.6.  Patient is currently NPO. Peripheral IV access only at this time pending possible central line placement. Potassium 10 mEq IV x 2 given in the ED. 2L fluid boluses ordered.   Per chart several labs were hemolyzed. Originally ordered Kcl 10 mEq x 6. Per chart: RN discussed with MD to give 2/6 ordered bags and only administer remaining 4 after BMP is re-drawn. Will re-order KCl 10 mEq x 2, d/c original order, and follow-up potassium on BMP.   Goal of Therapy:  Electrolytes within normal limits  Plan:  -Will order magnesium 2 g IV x 1 and potassium 10 mEq IV x 2 -Re-check electrolytes at 2200   4/3@2200  K 3.5, Phos 1.6 -Will order Potassium Phosphate IV 32mmol x 1 dose. -Recheck Electrolytes (K, Mg, Phos) tomorrow AM  [name], PharmD Clinical Pharmacist 05/31/2019 10:37 PM

## 2019-05-10 NOTE — H&P (Addendum)
Crystal Vargas is an 74 y.o. female.    Chief Complaint: Altered mental status HPI: Patient is a 74 year old lifelong never smoker who has been followed by [facility] nephrology and initiated dialysis in January.  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  She has chronic issues with dysphagia due to Schatzki's ring and has been evaluated by GI and has had esophageal dilatation in the past.  The patient cannot provide reliable history due to altered mental status.  Daughter who is present with her states that she has had poor p.o. intake due to dysphagia.  She currently resides in a facility in [place] due to needing skilled nursing care.  She gets dialysis on Monday Wednesday Fridays and last dialysis was yesterday.  Per the facility where the patient resides the patient had an abrupt decline in mental status and EMS was activated.  EMS found the patient to be hypotensive.  Evaluation by the ED physicians showed the patient to be with altered mental status and moaning.  At the time of my evaluation the patient is more responsive but only answering yes or no questions and not elaborating on history.  Evaluation in the emergency room shows that the patient has profound hypokalemia at 2.1, profound hypoalbuminemia at 1.3 and significant anemia with hemoglobin of 6.9 and hematocrit of 21.4.  There is also thrombocytopenia with platelets of 82,000.  She has poor IV access.  Patient is responding to volume resuscitation however has had to have pressor started peripherally.  PCCM was asked to admit the patient for further management.  No other history is elicited.  Past Medical History:  Diagnosis Date  . Chronic kidney disease    RENAL INSUFFICIENCY  . Coronary artery disease   . Depression   . Diabetes mellitus without complication ([place])   . GERD (gastroesophageal reflux disease)   . Glaucoma   . Hyperlipidemia   . Hypertension   .  Stroke [facility])     Past Surgical History:  Procedure Laterality Date  . ABDOMINAL HYSTERECTOMY    . ESOPHAGOGASTRODUODENOSCOPY (EGD) WITH PROPOFOL N/A 01/21/2018   Procedure: ESOPHAGOGASTRODUODENOSCOPY (EGD) WITH PROPOFOL;  Surgeon: Manya Silvas, MD;  Location: [facility] ENDOSCOPY;  Service: Endoscopy;  Laterality: N/A;    History reviewed. No pertinent family history. Social History:  reports that she has never smoked. She has never used smokeless tobacco. She reports previous alcohol use. She reports that she does not use drugs.  Allergies:  Allergies  Allergen Reactions  . Abacavir Other (See Comments)  . Cephalosporins Other (See Comments)  . Ciprofloxacin Other (See Comments)  . Nsaids Other (See Comments)  . Penicillin G Other (See Comments)  . Quinolones Other (See Comments)  . Salicylates Other (See Comments)    Current Facility-Administered Medications:  .  aztreonam (AZACTAM) 1 g in sodium chloride 0.9 % 100 mL IVPB, 1 g, Intravenous, Q24H, Nazari, Walid A, RPH .  Chlorhexidine Gluconate Cloth 2 % PADS 6 each, 6 each, Topical, Daily, Tyler Pita, MD, 6 each at 05/13/2019 1845 .  norepinephrine (LEVOPHED) 4mg  in 235mL premix infusion, 0-40 mcg/min, Intravenous, Continuous, Vanessa Rives, MD, Last Rate: 75 mL/hr at 06/02/2019 1940, 20 mcg/min at 05/09/2019 1940 .  potassium chloride 10 mEq in 100 mL IVPB, 10 mEq, Intravenous, Q1 Hr x 2, Benita Gutter, RPH, Last Rate: 100 mL/hr at 05/11/2019 1952, 10 mEq at 05/12/2019 1952 .  [START ON 05/12/2019]  vancomycin (VANCOREADY) IVPB 750 mg/150 mL, 750 mg, Intravenous, Q M,W,F-HD, Nazari, Walid A, RPH  . aztreonam    . norepinephrine (LEVOPHED) Adult infusion 20 mcg/min (05/13/2019 1940)  . potassium chloride 10 mEq (06/02/2019 1952)  . [START ON 05/12/2019] vancomycin       Results for orders placed or performed during the hospital encounter of 05/17/2019 (from the past 48 hour(s))  Blood gas, venous     Status: Abnormal   Collection  Time: 05/09/2019 12:34 PM  Result Value Ref Range   pH, Ven 7.29 7.250 - 7.430   pCO2, Ven 37 (L) 44.0 - 60.0 mmHg   pO2, Ven 46.0 (H) 32.0 - 45.0 mmHg   Bicarbonate 17.8 (L) 20.0 - 28.0 mmol/L   Acid-base deficit 8.1 (H) 0.0 - 2.0 mmol/L   O2 Saturation 75.8 %   Patient temperature 37.0    Sample type VENOUS     Comment: Performed at [facility], [address] [place], [place] [address]  Ammonia     Status: None   Collection Time: 05/30/2019 12:34 PM  Result Value Ref Range   Ammonia 13 9 - 35 umol/L    Comment: Performed at [facility], [address] [place], [place] [address]  Comprehensive metabolic panel     Status: Abnormal   Collection Time: 05/09/2019  1:24 PM  Result Value Ref Range   Sodium 139 135 - 145 mmol/L   Potassium 2.1 (LL) 3.5 - 5.1 mmol/L    Comment: CRITICAL RESULT CALLED TO, READ BACK BY AND VERIFIED WITH ROYCE BARHAM ON 05/14/2019 AT 1446 TIK    Chloride 107 98 - 111 mmol/L   CO2 20 (L) 22 - 32 mmol/L   Glucose, Bld 137 (H) 70 - 99 mg/dL    Comment: Glucose reference range applies only to samples taken after fasting for at least 8 hours.   BUN 20 8 - 23 mg/dL   Creatinine, Ser 1.68 (H) 0.44 - 1.00 mg/dL   Calcium 6.7 (L) 8.9 - 10.3 mg/dL   Total Protein 3.9 (L) 6.5 - 8.1 g/dL   Albumin 1.3 (L) 3.5 - 5.0 g/dL   AST 26 15 - 41 U/L   ALT 14 0 - 44 U/L   Alkaline Phosphatase 55 38 - 126 U/L   Total Bilirubin 1.5 (H) 0.3 - 1.2 mg/dL   GFR calc non Af Amer 29 (L) >60 mL/min   GFR calc Af Amer 34 (L) >60 mL/min   Anion gap 12 5 - 15    Comment: Performed at [facility], [address] [place], [place] [address]  Lipase, blood     Status: Abnormal   Collection Time: 06/01/2019  1:24 PM  Result Value Ref Range   Lipase <10 (L) 11 - 51 U/L    Comment: Performed at [facility], [address] [place], [place] [address]  Ethanol     Status: None   Collection Time: 05/24/2019  1:24 PM  Result Value Ref Range   Alcohol,  Ethyl (B) <10 <10 mg/dL    Comment: (NOTE) Lowest detectable limit for serum alcohol is 10 mg/dL. For medical purposes only. Performed at [facility], [address] [place], [place] [address]   CK     Status: Abnormal   Collection Time: 05/20/2019  1:24 PM  Result Value Ref Range   Total CK 17 (L) 38 - 234 U/L    Comment: Performed at [facility], [address] [place], [place] [address]  Troponin I (High Sensitivity)     Status: Abnormal   Collection Time: 05/31/2019  1:24 PM  Result Value Ref Range   Troponin I (High Sensitivity) 66 (H) <18 ng/L    Comment: (NOTE) Elevated high sensitivity troponin I (hsTnI) values and significant  changes across serial measurements may suggest ACS but many other  chronic and acute conditions are known to elevate hsTnI results.  Refer to the "Links" section for chest pain algorithms and additional  guidance. Performed at [facility], [address] [place], [place] [address]   TSH     Status: None   Collection Time: 05/13/2019  1:24 PM  Result Value Ref Range   TSH 0.594 0.350 - 4.500 uIU/mL    Comment: Performed by a 3rd Generation assay with a functional sensitivity of <=0.01 uIU/mL. Performed at [facility], [address] [place], [place] [address]   T4, free     Status: Abnormal   Collection Time: 05/13/2019  1:24 PM  Result Value Ref Range   Free T4 1.17 (H) 0.61 - 1.12 ng/dL    Comment: (NOTE) Biotin ingestion may interfere with free T4 tests. If the results are inconsistent with the TSH level, previous test results, or the clinical presentation, then consider biotin interference. If needed, order repeat testing after stopping biotin. Performed at [facility], [address] [place], [place] [address]   Lactic acid, plasma     Status: None   Collection Time: 05/17/2019  1:24 PM  Result Value Ref Range   Lactic Acid, Venous 1.9 0.5 - 1.9 mmol/L    Comment: Performed at  [facility], [address] [place], [place] [address]  Procalcitonin - Baseline     Status: None   Collection Time: 05/11/2019  1:24 PM  Result Value Ref Range   Procalcitonin 62.53 ng/mL    Comment:        Interpretation: PCT >= 10 ng/mL: Important systemic inflammatory response, almost exclusively due to severe bacterial sepsis or septic shock. (NOTE)       Sepsis PCT Algorithm           Lower Respiratory Tract                                      Infection PCT Algorithm    ----------------------------     ----------------------------         PCT < 0.25 ng/mL                PCT < 0.10 ng/mL         Strongly encourage             Strongly discourage   discontinuation of antibiotics    initiation of antibiotics    ----------------------------     -----------------------------       PCT 0.25 - 0.50 ng/mL            PCT 0.10 - 0.25 ng/mL               OR       >80% decrease in PCT            Discourage initiation of  antibiotics      Encourage discontinuation           of antibiotics    ----------------------------     -----------------------------         PCT >= 0.50 ng/mL              PCT 0.26 - 0.50 ng/mL                AND       <80% decrease in PCT             Encourage initiation of                                             antibiotics       Encourage continuation           of antibiotics    ----------------------------     -----------------------------        PCT >= 0.50 ng/mL                  PCT > 0.50 ng/mL               AND         increase in PCT                  Strongly encourage                                      initiation of antibiotics    Strongly encourage escalation           of antibiotics                                     -----------------------------                                           PCT <= 0.25 ng/mL                                                 OR                                         > 80% decrease in PCT                                     Discontinue / Do not initiate                                             antibiotics Performed at [facility], [address] [place], [place] [address]   Respiratory Panel by RT PCR (Flu A&B, Covid) - Nasopharyngeal Swab  Status: None   Collection Time: 06/04/2019  1:24 PM   Specimen: Nasopharyngeal Swab  Result Value Ref Range   SARS Coronavirus 2 by RT PCR NEGATIVE NEGATIVE    Comment: (NOTE) SARS-CoV-2 target nucleic acids are NOT DETECTED. The SARS-CoV-2 RNA is generally detectable in upper respiratoy specimens during the acute phase of infection. The lowest concentration of SARS-CoV-2 viral copies this assay can detect is 131 copies/mL. A negative result does not preclude SARS-Cov-2 infection and should not be used as the sole basis for treatment or other patient management decisions. A negative result may occur with  improper specimen collection/handling, submission of specimen other than nasopharyngeal swab, presence of viral mutation(s) within the areas targeted by this assay, and inadequate number of viral copies (<131 copies/mL). A negative result must be combined with clinical observations, patient history, and epidemiological information. The expected result is Negative. Fact Sheet for Patients:  PinkCheek.be Fact Sheet for Healthcare Providers:  GravelBags.it This test is not yet ap proved or cleared by the [place] FDA and  has been authorized for detection and/or diagnosis of SARS-CoV-2 by FDA under an Emergency Use Authorization (EUA). This EUA will remain  in effect (meaning this test can be used) for the duration of the COVID-19 declaration under Section 564(b)(1) of the Act, 21 U.S.C. section 360bbb-3(b)(1), unless the authorization is terminated or revoked sooner.    Influenza A by PCR NEGATIVE NEGATIVE   Influenza B by PCR  NEGATIVE NEGATIVE    Comment: (NOTE) The Xpert Xpress SARS-CoV-2/FLU/RSV assay is intended as an aid in  the diagnosis of influenza from Nasopharyngeal swab specimens and  should not be used as a sole basis for treatment. Nasal washings and  aspirates are unacceptable for Xpert Xpress SARS-CoV-2/FLU/RSV  testing. Fact Sheet for Patients: PinkCheek.be Fact Sheet for Healthcare Providers: GravelBags.it This test is not yet approved or cleared by the [place] FDA and  has been authorized for detection and/or diagnosis of SARS-CoV-2 by  FDA under an Emergency Use Authorization (EUA). This EUA will remain  in effect (meaning this test can be used) for the duration of the  Covid-19 declaration under Section 564(b)(1) of the Act, 21  U.S.C. section 360bbb-3(b)(1), unless the authorization is  terminated or revoked. Performed at [facility], [address] [place], [place] [address]   CBC with Differential/Platelet     Status: Abnormal   Collection Time: 05/09/2019  1:24 PM  Result Value Ref Range   WBC 9.4 4.0 - 10.5 K/uL   RBC 2.07 (L) 3.87 - 5.11 MIL/uL   Hemoglobin 6.9 (L) 12.0 - 15.0 g/dL   HCT 21.4 (L) 36.0 - 46.0 %   MCV 103.4 (H) 80.0 - 100.0 fL   MCH 33.3 26.0 - 34.0 pg   MCHC 32.2 30.0 - 36.0 g/dL   RDW 18.1 (H) 11.5 - 15.5 %   Platelets 82 (L) 150 - 400 K/uL    Comment: Immature Platelet Fraction may be clinically indicated, consider ordering this additional test [id number]    nRBC 0.0 0.0 - 0.2 %   Neutrophils Relative % 82 %   Neutro Abs 7.5 1.7 - 7.7 K/uL   Lymphocytes Relative 3 %   Lymphs Abs 0.3 (L) 0.7 - 4.0 K/uL   Monocytes Relative 1 %   Monocytes Absolute 0.1 0.1 - 1.0 K/uL   Eosinophils Relative 0 %   Eosinophils Absolute 0.0 0.0 - 0.5 K/uL   Basophils Relative 0 %   Basophils Absolute 0.0  0.0 - 0.1 K/uL   WBC Morphology MILD LEFT SHIFT (1-5% METAS, OCC MYELO, OCC BANDS)    Smear  Review PLATELETS APPEAR DECREASED    Immature Granulocytes 14 %   Abs Immature Granulocytes 1.33 (H) 0.00 - 0.07 K/uL   Polychromasia PRESENT     Comment: Performed at [facility], [address] [place], [place] [address]  Prepare RBC (crossmatch)     Status: None (Preliminary result)   Collection Time: 05/10/2019  3:50 PM  Result Value Ref Range   Order Confirmation      NO CURRENT SAMPLE, MUST ORDER TYPE AND SCREEN Performed at [facility], [address], [place], [place] [address]    CT Head Wo Contrast  Result Date: 05/31/2019 CLINICAL DATA:  Headache, head trauma. Altered mental status EXAM: CT HEAD WITHOUT CONTRAST TECHNIQUE: Contiguous axial images were obtained from the base of the skull through the vertex without intravenous contrast. COMPARISON:  None. FINDINGS: Brain: No acute hemorrhage. No subdural or extra-axial collection. Large chronic right MCA distribution encephalomalacia. Associated ex vacuo dilatation of the right lateral ventricle. Background periventricular chronic small vessel ischemia. No evidence of acute infarct. Vascular: Atherosclerosis of skullbase vasculature without hyperdense vessel or abnormal calcification. Skull: No fracture or focal lesion. Sinuses/Orbits: Paranasal sinuses and mastoid air cells are clear. The visualized orbits are unremarkable. Other: None. IMPRESSION: 1. No acute intracranial abnormality. 2. Large remote right MCA distribution infarct with ex vacuo dilatation of the right lateral ventricle. Background chronic small vessel ischemia. Electronically Signed   By: Keith Rake M.D.   On: 05/08/2019 15:09   CT Cervical Spine Wo Contrast  Result Date: 05/17/2019 CLINICAL DATA:  Polytrauma, critical, head/C-spine injury suspected EXAM: CT CERVICAL SPINE WITHOUT CONTRAST TECHNIQUE: Multidetector CT imaging of the cervical spine was performed without intravenous contrast. Multiplanar CT image reconstructions were also  generated. COMPARISON:  None. FINDINGS: Alignment: No evidence of traumatic subluxation. Trace anterolisthesis of C6 on C7 is likely degenerative and facet mediated. Skull base and vertebrae: No acute fracture. Vertebral body heights are maintained. The dens and skull base are intact. Soft tissues and spinal canal: No prevertebral fluid or swelling. No visible canal hematoma. Disc levels: Multilevel endplate spurring with mild disc space narrowing at C5-C6 and C6-C7. Multilevel facet hypertrophy. Upper chest: Right internal jugular dialysis catheter in place. Patulous esophagus containing intraluminal fluid/debris. Minimal patchy opacity in the perifissural left upper lobe, partially included. Other: Carotid calcifications. IMPRESSION: 1. Degenerative change in the cervical spine without acute fracture or subluxation. 2. Patulous esophagus containing intraluminal fluid/debris. Electronically Signed   By: Keith Rake M.D.   On: 05/11/2019 15:13   DG Chest Portable 1 View  Result Date: 05/09/2019 CLINICAL DATA:  Shortness of breath EXAM: PORTABLE CHEST 1 VIEW COMPARISON:  None. FINDINGS: The heart size and mediastinal contours are within normal limits. Large-bore right neck multi lumen vascular catheter. Both lungs are clear. The visualized skeletal structures are unremarkable. IMPRESSION: No acute abnormality of the lungs in AP portable projection. Electronically Signed   By: Eddie Candle M.D.   On: 05/25/2019 15:13    Review of Systems  Unable to perform ROS: Mental status change    Blood pressure (!) 79/62, pulse 75, temperature (S) 98.1 F (36.7 C), temperature source (S) Rectal, resp. rate (!) 25, height 5\' 2"  (1.575 m), weight 65.2 kg, SpO2 100 %. Physical Exam  GENERAL: Frail, debilitated appearing woman, lethargic, significant psychomotor retardation, does interact with very terse responses only to  yes/no questions. HEAD: Normocephalic, atraumatic.  EYES: Pupils equal, round, reactive to  light.  No scleral icterus.  MOUTH: Oral mucosa, crusting of lips. NECK: Supple. No thyromegaly. No nodules. No JVD.  PermCath on right. PULMONARY: Lungs clear to auscultation bilaterally. CARDIOVASCULAR: S1 and S2. Regular rate and rhythm.  GASTROINTESTINAL: Soft abdomen, nondistended, normoactive bowel sounds. MUSCULOSKELETAL: Diminished muscle mass particularly of the lower extremities. NEUROLOGIC: Dense left hemiparesis, lethargic, no other deficit noted.  Significant psychomotor retardation. SKIN: Intact,warm,dry. PSYCH: Cannot assess fully due to lethargy.  Seems to have flat affect.   Radiographic data reviewed independently.  CT head:  Significant encephalomalacia in right MCA distribution.  CT C-spine showed only chronic changes.  No trauma.  Patient does have a very patulous esophagus which is distended and has food particles in it.  Chest x-ray:   No acute infiltrate.  Perm cath on right.  Assessment/Plan  Shock likely hypovolemic versus septic Low oncotic pressure due to hypoalbuminemia Elevated procalcitonin Evidence of volume depletion Panculture Aggressive volume resuscitation Albumin supplementation Pressors as needed to maintain MAP > 65 or better Has poor IV access, central access, discussed with daughter at bedside  Kidney disease on dialysis Profound hypokalemia Monitor renal function Avoid nephrotoxins BUN 20/creatinine 1.68 No overt need for dialysis at present Check magnesium and replete as needed Replete potassium Renal consultation, discussed with Dr. Juleen China  Possible sepsis Elevated procalcitonin History of dysphagia Dilated esophagus with food particles on CT C-spine Chest x-ray does not show infiltrate, however patient volume depleted Cannot rule out aspiration pneumonia Check urine for urinalysis and culture Empiric antibiotics, pharmacy consulted COVID-19 negative  Anemia Macrocytosis Mild thrombocytopenia Not new issue No  signs of active bleeding Check B12/folate Iron studies Likely due to chronic illness Poor nutrition Monitor  Severe protein calorie malnutrition Poor p.o. intake due to chronic issues with dysphagia Query sideropenic dysphagia History of Schatzki's ring History of right MCA CVA N.P.O. for now Consider GI consultation once patient more stable  Severe deconditioning Dense left hemiparesis Bedridden chronically Consider evaluation by PT OT  Prophylaxis No anticoagulants due to thrombocytopenia SCDs GI prophylaxis as needed   Discussed with patient's daughter at bedside.  Critical care time 60 minutes  C. Derrill Kay, MD [facility] 05/27/2019, 4:22 PM   *This note was dictated using voice recognition software/Dragon.  Despite best efforts to proofread, errors can occur which can change the meaning.  Any change was purely unintentional.

## 2019-05-10 NOTE — Procedures (Signed)
Central Venous Catheter Insertion Procedure Note Tytianna Greenley [id number] Oct 28, 1945  Procedure: Insertion of Central Venous Catheter Indications: Assessment of intravascular volume, Drug and/or fluid administration and Frequent blood sampling  Procedure Details Consent: Risks of procedure as well as the alternatives and risks of each were explained to the (patient/caregiver).  Consent for procedure obtained. Time Out: Verified patient identification, verified procedure, site/side was marked, verified correct patient position, special equipment/implants available, medications/allergies/relevent history reviewed, required imaging and test results available.  Performed  Maximum sterile technique was used including antiseptics, cap, gloves, gown, hand hygiene, mask and sheet. Skin prep: Chlorhexidine; local anesthetic administered A antimicrobial bonded/coated triple lumen catheter was placed in the left femoral vein due to patient being a dialysis patient using the Seldinger technique.  Evaluation Blood flow good Complications: No apparent complications Patient did tolerate procedure well. Chest X-ray ordered to verify placement.  CXR: Not needed, placed in Left Femoral Vein.    Procedure was performed using Ultrasound for direct visualization of cannulization of Left Femoral Vein.  Line was secured at the 20 cm mark.  BIOPATCH placed at insertion site.     Darel Hong, AGACNP-BC [facility] Pager: [phone]  Bradly Bienenstock 05/16/2019, 9:11 PM

## 2019-05-10 NOTE — ED Notes (Signed)
IV team had no success in attaining a third IV. Lab was called to collect blood due to several labs being hemolyzed. If patient does not receive bed placement, call ICU MD and inform MD of situation and Central line placement in ED will be further evaluated.   RN spoke with ICU MD Patsey Berthold and discussed central line placement as well as the delay in medications due to lack of access sites, incompatibility and medications taking priority over others.   RN also discussed Potassium runs. 2x runs are nearly complete that the ED provider put in, there is another order for 6x additional runs. ICU MD Patsey Berthold gave verbal order to give 2x additional runs of Potassium, draw a BMP and wait for Potasium level to populate before giving the remianing 4x runs of potassium.

## 2019-05-10 NOTE — ED Notes (Signed)
The specimen that lab drew via straight stick was hemolyzed.   Once central line placement is established a BMP, and a type and screen.

## 2019-05-10 NOTE — ED Triage Notes (Signed)
Pt tp ED from [facility] via [facility].  From EMS: pt had been declining x2 weeks, reported by speech therapy. Pt has history of stroke. Pt SBP 84 for EMS.   Pt currently AMS, unsure of pt baseline d/t inadequate report from nursing staff at facility.

## 2019-05-10 NOTE — Consult Note (Signed)
PHARMACY CONSULT NOTE - FOLLOW UP  Pharmacy Consult for Electrolyte Monitoring and Replacement   Recent Labs: Potassium (mmol/L)  Date Value  05/09/2019 2.1 (LL)   Magnesium (mg/dL)  Date Value  05/11/2019 1.6 (L)   Calcium (mg/dL)  Date Value  06/02/2019 6.7 (L)   Albumin (g/dL)  Date Value  05/17/2019 1.3 (L)   Sodium (mmol/L)  Date Value  05/24/2019 139  Corrected calcium: 8.9 mg/dL   Assessment: Patient is a 74 y/o F with a history of ESRD on HD, diabetes, seizures, CVA who is admitted with sepsis. Patient was last dialyzed Friday (4/2).   ABG with pH 7.29. Labs significant for hypokalemia with a K of 2.1 and hypomagnesemia with Mg 1.6.  Patient is currently NPO. Peripheral IV access only at this time pending possible central line placement. Potassium 10 mEq IV x 2 given in the ED. 2L fluid boluses ordered.   Per chart several labs were hemolyzed. Originally ordered Kcl 10 mEq x 6. Per chart: RN discussed with MD to give 2/6 ordered bags and only administer remaining 4 after BMP is re-drawn. Will re-order KCl 10 mEq x 2, d/c original order, and follow-up potassium on BMP.   Goal of Therapy:  Electrolytes within normal limits  Plan:  -Will order magnesium 2 g IV x 1 and potassium 10 mEq IV x 2 -Re-check potassium at 2200 -Electrolytes (K, Mg, Phos) tomorrow AM  [place] Resident 05/14/2019 4:33 PM

## 2019-05-11 ENCOUNTER — Inpatient Hospital Stay
Admit: 2019-05-11 | Discharge: 2019-05-11 | Disposition: A | Discharge status: Home / Self Care | Payer: Medicare Other | Attending: Pulmonary Disease | Admitting: Pulmonary Disease

## 2019-05-11 ENCOUNTER — Inpatient Hospital Stay: Payer: Medicare Other

## 2019-05-11 DIAGNOSIS — K222 Esophageal obstruction: Secondary | ICD-10-CM

## 2019-05-11 DIAGNOSIS — R7881 Bacteremia: Secondary | ICD-10-CM | POA: Diagnosis present

## 2019-05-11 DIAGNOSIS — B9562 Methicillin resistant Staphylococcus aureus infection as the cause of diseases classified elsewhere: Secondary | ICD-10-CM | POA: Diagnosis present

## 2019-05-11 DIAGNOSIS — Z8673 Personal history of transient ischemic attack (TIA), and cerebral infarction without residual deficits: Secondary | ICD-10-CM

## 2019-05-11 LAB — BLOOD CULTURE ID PANEL (REFLEXED)

## 2019-05-11 LAB — GLUCOSE, CAPILLARY
Glucose-Capillary: 105 mg/dL — ABNORMAL HIGH (ref 70–99)
Glucose-Capillary: 116 mg/dL — ABNORMAL HIGH (ref 70–99)
Glucose-Capillary: 150 mg/dL — ABNORMAL HIGH (ref 70–99)
Glucose-Capillary: 160 mg/dL — ABNORMAL HIGH (ref 70–99)
Glucose-Capillary: 194 mg/dL — ABNORMAL HIGH (ref 70–99)
Glucose-Capillary: 225 mg/dL — ABNORMAL HIGH (ref 70–99)
Glucose-Capillary: 234 mg/dL — ABNORMAL HIGH (ref 70–99)

## 2019-05-11 LAB — BASIC METABOLIC PANEL
Anion gap: 19 — ABNORMAL HIGH (ref 5–15)
BUN: 28 mg/dL — ABNORMAL HIGH (ref 8–23)
CO2: 15 mmol/L — ABNORMAL LOW (ref 22–32)
Calcium: 7.8 mg/dL — ABNORMAL LOW (ref 8.9–10.3)
Chloride: 100 mmol/L (ref 98–111)
Creatinine, Ser: 2.13 mg/dL — ABNORMAL HIGH (ref 0.44–1.00)
GFR calc Af Amer: 25 mL/min — ABNORMAL LOW (ref >60)
GFR calc non Af Amer: 22 mL/min — ABNORMAL LOW (ref >60)
Glucose, Bld: 261 mg/dL — ABNORMAL HIGH (ref 70–99)
Potassium: 3.6 mmol/L (ref 3.5–5.1)
Sodium: 134 mmol/L — ABNORMAL LOW (ref 135–145)

## 2019-05-11 LAB — URINALYSIS, COMPLETE (UACMP) WITH MICROSCOPIC
Bilirubin Urine: NEGATIVE
Glucose, UA: NEGATIVE mg/dL
Ketones, ur: NEGATIVE mg/dL
Nitrite: NEGATIVE
Protein, ur: 100 mg/dL — AB
RBC / HPF: >50 RBC/hpf — ABNORMAL HIGH (ref 0–5)
Specific Gravity, Urine: 1.023 (ref 1.005–1.030)
WBC, UA: >50 WBC/hpf — ABNORMAL HIGH (ref 0–5)
pH: 5 (ref 5.0–8.0)

## 2019-05-11 LAB — CORTISOL: Cortisol, Plasma: 62.8 ug/dL

## 2019-05-11 LAB — HEMOGLOBIN A1C
Hgb A1c MFr Bld: 5.6 % (ref 4.8–5.6)
Mean Plasma Glucose: 114.02 mg/dL

## 2019-05-11 LAB — CBC
HCT: 22.2 % — ABNORMAL LOW (ref 36.0–46.0)
Hemoglobin: 7 g/dL — ABNORMAL LOW (ref 12.0–15.0)
MCH: 33.5 pg (ref 26.0–34.0)
MCHC: 31.5 g/dL (ref 30.0–36.0)
MCV: 106.2 fL — ABNORMAL HIGH (ref 80.0–100.0)
Platelets: 79 10*3/uL — ABNORMAL LOW (ref 150–400)
RBC: 2.09 MIL/uL — ABNORMAL LOW (ref 3.87–5.11)
RDW: 18.6 % — ABNORMAL HIGH (ref 11.5–15.5)
WBC: 8.5 10*3/uL (ref 4.0–10.5)
nRBC: 0.4 % — ABNORMAL HIGH (ref 0.0–0.2)

## 2019-05-11 LAB — PHOSPHORUS: Phosphorus: 4.6 mg/dL (ref 2.5–4.6)

## 2019-05-11 LAB — VITAMIN B12: Vitamin B-12: 584 pg/mL (ref 180–914)

## 2019-05-11 LAB — HEMOGLOBIN AND HEMATOCRIT, BLOOD
HCT: 23.9 % — ABNORMAL LOW (ref 36.0–46.0)
Hemoglobin: 8 g/dL — ABNORMAL LOW (ref 12.0–15.0)

## 2019-05-11 LAB — PROCALCITONIN: Procalcitonin: 55.12 ng/mL

## 2019-05-11 LAB — POTASSIUM: Potassium: 3.3 mmol/L — ABNORMAL LOW (ref 3.5–5.1)

## 2019-05-11 LAB — HEPATITIS B SURFACE ANTIGEN: Hepatitis B Surface Ag: NONREACTIVE

## 2019-05-11 LAB — LACTIC ACID, PLASMA
Lactic Acid, Venous: 1.5 mmol/L (ref 0.5–1.9)
Lactic Acid, Venous: 1.4 mmol/L (ref 0.5–1.9)

## 2019-05-11 LAB — HEPATITIS C ANTIBODY: HCV Ab: NONREACTIVE

## 2019-05-11 LAB — MAGNESIUM: Magnesium: 2.3 mg/dL (ref 1.7–2.4)

## 2019-05-11 LAB — HEPATITIS B SURFACE ANTIBODY,QUALITATIVE: Hep B S Ab: NONREACTIVE

## 2019-05-11 LAB — HEPATITIS B CORE ANTIBODY, IGM: Hep B C IgM: NONREACTIVE

## 2019-05-11 LAB — HIV ANTIBODY (ROUTINE TESTING W REFLEX): HIV Screen 4th Generation wRfx: NONREACTIVE

## 2019-05-11 MED ORDER — THIAMINE HCL 100 MG/ML IJ SOLN
Freq: Once | INTRAVENOUS | Status: AC
  Filled 2019-05-11: qty 1000

## 2019-05-11 MED ORDER — POTASSIUM CHLORIDE 10 MEQ/100ML IV SOLN
10.0000 meq | Freq: Once | INTRAVENOUS | Status: AC
  Administered 2019-05-11: 18:00:00 10 meq via INTRAVENOUS
  Filled 2019-05-11: qty 100

## 2019-05-11 MED ORDER — POTASSIUM CHLORIDE 10 MEQ/100ML IV SOLN
10.0000 meq | INTRAVENOUS | Status: AC
  Administered 2019-05-11 (×3): 10 meq via INTRAVENOUS
  Filled 2019-05-11 (×3): qty 100

## 2019-05-11 MED ORDER — VASOPRESSIN 20 UNIT/ML IV SOLN
0.0300 [IU]/min | INTRAVENOUS | Status: DC
  Administered 2019-05-11 – 2019-05-12 (×2): 0.03 [IU]/min via INTRAVENOUS
  Filled 2019-05-11 (×3): qty 2

## 2019-05-11 MED ORDER — POTASSIUM CHLORIDE 10 MEQ/100ML IV SOLN
10.0000 meq | INTRAVENOUS | Status: DC
  Filled 2019-05-11 (×4): qty 100

## 2019-05-11 NOTE — Progress Notes (Signed)
*  PRELIMINARY RESULTS* Echocardiogram 2D Echocardiogram has been performed.  Crystal Vargas 05/11/2019, 11:32 AM

## 2019-05-11 NOTE — Consult Note (Signed)
PHARMACY CONSULT NOTE - FOLLOW UP  Pharmacy Consult for Electrolyte Monitoring and Replacement   Recent Labs: Potassium (mmol/L)  Date Value  05/11/2019 3.6   Magnesium (mg/dL)  Date Value  05/11/2019 2.3   Calcium (mg/dL)  Date Value  05/11/2019 7.8 (L)   Albumin (g/dL)  Date Value  05/09/2019 1.3 (L)   Phosphorus (mg/dL)  Date Value  05/11/2019 4.6   Sodium (mmol/L)  Date Value  05/11/2019 134 (L)  Corrected calcium: 8.9 mg/dL   Assessment: Patient is a 74 y/o F with a history of ESRD on HD, diabetes, seizures, CVA who is admitted with sepsis. Patient was last dialyzed Friday (4/2).   ABG with pH 7.29. Labs significant for hypokalemia with a K of 2.1 and hypomagnesemia with Mg 1.6.  Patient is currently NPO. Peripheral IV access only at this time pending possible central line placement. Potassium 10 mEq IV x 2 given in the ED. 2L fluid boluses ordered.   Per chart several labs were hemolyzed. Originally ordered Kcl 10 mEq x 6. Per chart: RN discussed with MD to give 2/6 ordered bags and only administer remaining 4 after BMP is re-drawn. Will re-order KCl 10 mEq x 2, d/c original order, and follow-up potassium on BMP.   Goal of Therapy:  Electrolytes within normal limits  Plan:  -Will order magnesium 2 g IV x 1 and potassium 10 mEq IV x 2 -Re-check potassium at 2200 -Electrolytes (K, Mg, Phos) tomorrow AM  0403 2135 K+ = 3.5, WNL, Phos = 1.6 > KPhos 71mMols ordered.  Will f/u K+ and Phos in am  0404 0611 K+ = 3.6, WNL but goal is for >/+ 4.  Phos WNL after supplementation w/ K Phos.  Will order KCL IVPB 64meq IV q1h x 3 bags (83meq) and recheck K+ in ~ 4 hours  Hart Robinsons A 05/11/2019 6:55 AM

## 2019-05-11 NOTE — Op Note (Signed)
    Patient name: Crystal Vargas MRN: [id number] DOB: 02-Sep-1945 Sex: female  * No surgery found * Pre-operative Diagnosis: Infected dialysis catheter Post-operative diagnosis:  Same Surgeon:  Annamarie Major Procedure:   Removal of tunneled right internal jugular vein tunneled dialysis catheter Anesthesia: None Blood Loss: None Specimens: Catheter tip was sent for culture  Findings: The catheter was easily removed without having to dissect out the cuff.  It was clearly infected  Indications: The patient has MRSA bacteremia from her dialysis catheter.  Request for removal has been made.  Due to the circumstances, the patient nodded in agreement to proceed.  Procedure: The dressing over top of her dialysis catheter was removed.  The cuff was approximately 2 cm above the skin exit site.  I gave a gentle tug on the catheter and the cuff easily dislodged.  I then remove the entire catheter without difficulty.  There was purulent drainage from the skin exit site.  The catheter tip as well as the cuff were sent for culture.  Sterile dressings were applied.  There were no complications.     Theotis Burrow, M.D., [facility] [place] Office: [phone] Pager:  [phone]

## 2019-05-11 NOTE — Consult Note (Signed)
[facility] for Infectious Disease    Date of Admission:  05/25/2019           Day 2 vancomycin       Reason for Consult: Automatic consultation for MRSA bacteremia     Assessment: She developed MRSA bacteremia, most likely as a result of her hemodialysis catheter.  I agree with continuing vancomycin and and having her catheter removed.  I have ordered repeat blood cultures.  Ideally, it would be best to avoid placing a new catheter until we know that repeat blood cultures are negative for at least 48 hours.  Plan: 1. Continue vancomycin 2. Await results of repeat blood cultures and TTE  Principal Problem:   MRSA bacteremia Active Problems:   Shock ([place])   Diabetes ([place])   Essential hypertension   ESRD on dialysis ([place])   Status post CVA   Schatzki's ring   Scheduled Meds: . Chlorhexidine Gluconate Cloth  6 each Topical Daily  . insulin aspart  0-9 Units Subcutaneous Q4H   Continuous Infusions: . albumin human 60 mL/hr at 05/11/19 0923  . norepinephrine (LEVOPHED) Adult infusion 10 mcg/min (05/11/19 2836)  . potassium chloride 10 mEq (05/11/19 1221)  .  sodium bicarbonate (isotonic) infusion in sterile water 75 mL/hr at 05/11/19 0923  . banana bag IV 1000 mL    . [START ON 05/12/2019] vancomycin    . vasopressin (PITRESSIN) infusion - *FOR SHOCK* 0.03 Units/min (05/11/19 0923)   PRN Meds:.  HPI: Crystal Vargas is a 74 y.o. female with multiple medical problems including status post large right CVA and end-stage renal disease on hemodialysis who was admitted yesterday after developing a sudden decline in mental status and hypotension.  She was started on empiric antibiotics.  Both admission blood cultures have grown MRSA.   Review of Systems: Review of Systems  Unable to perform ROS: Other  Constitutional:       This is a remote consultation so no review of systems was obtained.    Past Medical History:  Diagnosis Date  . Chronic kidney disease    RENAL  INSUFFICIENCY  . Coronary artery disease   . Depression   . Diabetes mellitus without complication ([place])   . GERD (gastroesophageal reflux disease)   . Glaucoma   . Hyperlipidemia   . Hypertension   . Stroke [facility])     Social History   Tobacco Use  . Smoking status: Never Smoker  . Smokeless tobacco: Never Used  Substance Use Topics  . Alcohol use: Not Currently  . Drug use: Never    History reviewed. No pertinent family history. Allergies  Allergen Reactions  . Abacavir Other (See Comments)  . Cephalosporins Other (See Comments)  . Ciprofloxacin Other (See Comments)  . Nsaids Other (See Comments)  . Penicillin G Other (See Comments)  . Quinolones Other (See Comments)  . Salicylates Other (See Comments)    OBJECTIVE: Blood pressure (!) 67/34, pulse 71, temperature 97.9 F (36.6 C), temperature source Axillary, resp. rate (!) 21, height 5\' 2"  (1.575 m), weight 61.8 kg, SpO2 100 %.  Physical Exam Constitutional:      Comments: This is a remote consultation so no physical examination was performed.     Lab Results Lab Results  Component Value Date   WBC 8.5 05/11/2019   HGB 7.0 (L) 05/11/2019   HCT 22.2 (L) 05/11/2019   MCV 106.2 (H) 05/11/2019   PLT 79 (L) 05/11/2019  Lab Results  Component Value Date   CREATININE 2.13 (H) 05/11/2019   BUN 28 (H) 05/11/2019   NA 134 (L) 05/11/2019   K 3.6 05/11/2019   CL 100 05/11/2019   CO2 15 (L) 05/11/2019    Lab Results  Component Value Date   ALT 14 05/09/2019   AST 26 05/12/2019   ALKPHOS 55 05/18/2019   BILITOT 1.5 (H) 05/31/2019     Microbiology: Recent Results (from the past 240 hour(s))  Blood culture (routine x 2)     Status: None (Preliminary result)   Collection Time: 05/31/2019 12:34 PM   Specimen: BLOOD  Result Value Ref Range Status   Specimen Description BLOOD R UP ARM  Final   Special Requests   Final    BOTTLES DRAWN AEROBIC AND ANAEROBIC Blood Culture adequate volume   Culture  Setup Time    Final    Organism ID to follow IN BOTH AEROBIC AND ANAEROBIC BOTTLES GRAM POSITIVE COCCI CRITICAL RESULT CALLED TO, READ BACK BY AND VERIFIED WITH: [place] ON 05/11/19 AT 0210 [facility] Performed at [facility], [address] [place], [place] [address]    Culture GRAM POSITIVE COCCI  Final   Report Status PENDING  Incomplete  Blood Culture ID Panel (Reflexed)     Status: Abnormal   Collection Time: 05/08/2019 12:34 PM  Result Value Ref Range Status   Enterococcus species NOT DETECTED NOT DETECTED Final   Listeria monocytogenes NOT DETECTED NOT DETECTED Final   Staphylococcus species DETECTED (A) NOT DETECTED Final    Comment: CRITICAL RESULT CALLED TO, READ BACK BY AND VERIFIED WITH: [place] ON 05/11/19 AT 0210 [facility]    Staphylococcus aureus (BCID) DETECTED (A) NOT DETECTED Final    Comment: Methicillin (oxacillin)-resistant Staphylococcus aureus (MRSA). MRSA is predictably resistant to beta-lactam antibiotics (except ceftaroline). Preferred therapy is vancomycin unless clinically contraindicated. Patient requires contact precautions if  hospitalized. CRITICAL RESULT CALLED TO, READ BACK BY AND VERIFIED WITH: SCOTT HALL ON 05/11/19 AT 0210 [facility]    Methicillin resistance DETECTED (A) NOT DETECTED Final    Comment: CRITICAL RESULT CALLED TO, READ BACK BY AND VERIFIED WITH: SCOTT HALL ON 05/11/19 AT 0210 [place]    Streptococcus species NOT DETECTED NOT DETECTED Final   Streptococcus agalactiae NOT DETECTED NOT DETECTED Final   Streptococcus pneumoniae NOT DETECTED NOT DETECTED Final   Streptococcus pyogenes NOT DETECTED NOT DETECTED Final   Acinetobacter baumannii NOT DETECTED NOT DETECTED Final   Enterobacteriaceae species NOT DETECTED NOT DETECTED Final   Enterobacter cloacae complex NOT DETECTED NOT DETECTED Final   Escherichia coli NOT DETECTED NOT DETECTED Final   Klebsiella oxytoca NOT DETECTED NOT DETECTED Final   Klebsiella pneumoniae NOT DETECTED NOT DETECTED Final   Proteus  species NOT DETECTED NOT DETECTED Final   Serratia marcescens NOT DETECTED NOT DETECTED Final   Haemophilus influenzae NOT DETECTED NOT DETECTED Final   Neisseria meningitidis NOT DETECTED NOT DETECTED Final   Pseudomonas aeruginosa NOT DETECTED NOT DETECTED Final   Candida albicans NOT DETECTED NOT DETECTED Final   Candida glabrata NOT DETECTED NOT DETECTED Final   Candida krusei NOT DETECTED NOT DETECTED Final   Candida parapsilosis NOT DETECTED NOT DETECTED Final   Candida tropicalis NOT DETECTED NOT DETECTED Final    Comment: Performed at [facility], [address] [place], [place] [address]  Blood culture (routine x 2)     Status: None (Preliminary result)   Collection Time: 05/24/2019  1:24 PM  Specimen: BLOOD  Result Value Ref Range Status   Specimen Description BLOOD R LATREAL BICEP  Final   Special Requests   Final    BOTTLES DRAWN AEROBIC AND ANAEROBIC Blood Culture adequate volume   Culture  Setup Time   Final    IN BOTH AEROBIC AND ANAEROBIC BOTTLES GRAM POSITIVE COCCI CRITICAL VALUE NOTED.  VALUE IS CONSISTENT WITH PREVIOUSLY REPORTED AND CALLED VALUE. Performed at [facility], [address] [place], [place] [address]    Culture [facility] POSITIVE COCCI  Final   Report Status PENDING  Incomplete  Respiratory Panel by RT PCR (Flu A&B, Covid) - Nasopharyngeal Swab     Status: None   Collection Time: 05/30/2019  1:24 PM   Specimen: Nasopharyngeal Swab  Result Value Ref Range Status   SARS Coronavirus 2 by RT PCR NEGATIVE NEGATIVE Final    Comment: (NOTE) SARS-CoV-2 target nucleic acids are NOT DETECTED. The SARS-CoV-2 RNA is generally detectable in upper respiratoy specimens during the acute phase of infection. The lowest concentration of SARS-CoV-2 viral copies this assay can detect is 131 copies/mL. A negative result does not preclude SARS-Cov-2 infection and should not be used as the sole basis for treatment or other patient management decisions. A  negative result may occur with  improper specimen collection/handling, submission of specimen other than nasopharyngeal swab, presence of viral mutation(s) within the areas targeted by this assay, and inadequate number of viral copies (<131 copies/mL). A negative result must be combined with clinical observations, patient history, and epidemiological information. The expected result is Negative. Fact Sheet for Patients:  PinkCheek.be Fact Sheet for Healthcare Providers:  GravelBags.it This test is not yet ap proved or cleared by the [place] FDA and  has been authorized for detection and/or diagnosis of SARS-CoV-2 by FDA under an Emergency Use Authorization (EUA). This EUA will remain  in effect (meaning this test can be used) for the duration of the COVID-19 declaration under Section 564(b)(1) of the Act, 21 U.S.C. section 360bbb-3(b)(1), unless the authorization is terminated or revoked sooner.    Influenza A by PCR NEGATIVE NEGATIVE Final   Influenza B by PCR NEGATIVE NEGATIVE Final    Comment: (NOTE) The Xpert Xpress SARS-CoV-2/FLU/RSV assay is intended as an aid in  the diagnosis of influenza from Nasopharyngeal swab specimens and  should not be used as a sole basis for treatment. Nasal washings and  aspirates are unacceptable for Xpert Xpress SARS-CoV-2/FLU/RSV  testing. Fact Sheet for Patients: PinkCheek.be Fact Sheet for Healthcare Providers: GravelBags.it This test is not yet approved or cleared by the [place] FDA and  has been authorized for detection and/or diagnosis of SARS-CoV-2 by  FDA under an Emergency Use Authorization (EUA). This EUA will remain  in effect (meaning this test can be used) for the duration of the  Covid-19 declaration under Section 564(b)(1) of the Act, 21  U.S.C. section 360bbb-3(b)(1), unless the authorization is   terminated or revoked. Performed at [facility], [address] [place],  [address]   MRSA PCR Screening     Status: None   Collection Time: 05/08/2019  6:27 PM   Specimen: Nasopharyngeal  Result Value Ref Range Status   MRSA by PCR NEGATIVE NEGATIVE Final    Comment:        The GeneXpert MRSA Assay (FDA approved for NASAL specimens only), is one component of a comprehensive MRSA colonization surveillance program. It is not intended to diagnose MRSA infection nor to guide or monitor  treatment for MRSA infections. Performed at [facility], [address] [place], [place] [address]     Michel Bickers, [place] [facility] [phone] pager   [phone] cell 05/11/2019, 12:54 PM

## 2019-05-11 NOTE — Progress Notes (Addendum)
Follow up - Critical Care Medicine Note  Patient Details:    Crystal Vargas is an 74 y.o. female with a history of large right MCA stroke and bedridden status due to the same, initiated dialysis on January 2020, presented with profound hypokalemia, shock and volume depletion.  Admitted for further management of those issues.  Lines, Airways, Drains: CVC Triple Lumen 05/17/2019 Left Femoral (Active)  Indication for Insertion or Continuance of Line Vasoactive infusions 05/11/19 0800  Site Assessment Clean;Dry;Intact 05/16/2019 2130  Proximal Lumen Status Infusing;Blood return noted 05/15/2019 2130  Medial Lumen Status Infusing;In-line blood sampling system in place;Blood return noted 05/18/2019 2130  Distal Lumen Status Infusing;Blood return noted 05/16/2019 2130  Dressing Type Transparent;Occlusive 05/17/2019 2130  Dressing Status Clean;Dry;Intact 06/02/2019 2130  Line Care Proximal tubing changed;Medial tubing changed;Distal tubing changed;Connections checked and tightened;Line pulled back 05/23/2019 2130  Dressing Intervention New dressing 05/30/2019 2130  Dressing Change Due 05/17/19 05/11/2019 2130     External Urinary Catheter (Active)  Collection Container Dedicated Suction Canister 05/11/19 0400  Site Assessment Clean;Intact 05/11/19 0400  Intervention Equipment Changed 06/02/2019 2000    Anti-infectives:  Anti-infectives (From admission, onward)   Start     Dose/Rate Route Frequency Ordered Stop   05/12/19 1200  vancomycin (VANCOREADY) IVPB 500 mg/100 mL  Status:  Discontinued     500 mg 100 mL/hr over 60 Minutes Intravenous Every M-W-F (Hemodialysis) 05/16/2019 2016 05/09/2019 2018   05/12/19 1200  vancomycin (VANCOREADY) IVPB 750 mg/150 mL     750 mg 150 mL/hr over 60 Minutes Intravenous Every M-W-F (Hemodialysis) 06/02/2019 2018     05/13/2019 2100  aztreonam (AZACTAM) 1 g in sodium chloride 0.9 % 100 mL IVPB  Status:  Discontinued     1 g 200 mL/hr over 30 Minutes Intravenous Every 24 hours 05/31/2019  2016 05/11/19 1019   06/06/2019 1500  aztreonam (AZACTAM) injection 2 g  Status:  Discontinued     2 g Intramuscular Once 05/23/2019 1411 05/21/2019 1442   06/02/2019 1445  aztreonam (AZACTAM) 2 g in sodium chloride 0.9 % 100 mL IVPB  Status:  Discontinued     2 g 200 mL/hr over 30 Minutes Intravenous  Once 05/10/2019 1442 06/01/2019 2016   06/02/2019 1430  vancomycin (VANCOREADY) IVPB 1250 mg/250 mL     1,250 mg 166.7 mL/hr over 90 Minutes Intravenous  Once 06/02/2019 1411 05/25/2019 1635      Microbiology: Results for orders placed or performed during the hospital encounter of 06/06/2019  Blood culture (routine x 2)     Status: None (Preliminary result)   Collection Time: 06/02/2019 12:34 PM   Specimen: BLOOD  Result Value Ref Range Status   Specimen Description BLOOD R UP ARM  Final   Special Requests   Final    BOTTLES DRAWN AEROBIC AND ANAEROBIC Blood Culture adequate volume   Culture  Setup Time   Final    Organism ID to follow IN BOTH AEROBIC AND ANAEROBIC BOTTLES GRAM POSITIVE COCCI CRITICAL RESULT CALLED TO, READ BACK BY AND VERIFIED WITH: [place] ON 05/11/19 AT 0210 [facility] Performed at [facility], [address] [place], [place] [address]    Culture [facility] POSITIVE COCCI  Final   Report Status PENDING  Incomplete  Blood Culture ID Panel (Reflexed)     Status: Abnormal   Collection Time: 05/22/2019 12:34 PM  Result Value Ref Range Status   Enterococcus species NOT DETECTED NOT DETECTED Final   Listeria monocytogenes NOT DETECTED NOT DETECTED Final  Staphylococcus species DETECTED (A) NOT DETECTED Final    Comment: CRITICAL RESULT CALLED TO, READ BACK BY AND VERIFIED WITH: SCOTT HALL ON 05/11/19 AT 0210 [facility]    Staphylococcus aureus (BCID) DETECTED (A) NOT DETECTED Final    Comment: Methicillin (oxacillin)-resistant Staphylococcus aureus (MRSA). MRSA is predictably resistant to beta-lactam antibiotics (except ceftaroline). Preferred therapy is vancomycin unless clinically  contraindicated. Patient requires contact precautions if  hospitalized. CRITICAL RESULT CALLED TO, READ BACK BY AND VERIFIED WITH: SCOTT HALL ON 05/11/19 AT 0210 [facility]    Methicillin resistance DETECTED (A) NOT DETECTED Final    Comment: CRITICAL RESULT CALLED TO, READ BACK BY AND VERIFIED WITH: SCOTT HALL ON 05/11/19 AT 0210 [place]    Streptococcus species NOT DETECTED NOT DETECTED Final   Streptococcus agalactiae NOT DETECTED NOT DETECTED Final   Streptococcus pneumoniae NOT DETECTED NOT DETECTED Final   Streptococcus pyogenes NOT DETECTED NOT DETECTED Final   Acinetobacter baumannii NOT DETECTED NOT DETECTED Final   Enterobacteriaceae species NOT DETECTED NOT DETECTED Final   Enterobacter cloacae complex NOT DETECTED NOT DETECTED Final   Escherichia coli NOT DETECTED NOT DETECTED Final   Klebsiella oxytoca NOT DETECTED NOT DETECTED Final   Klebsiella pneumoniae NOT DETECTED NOT DETECTED Final   Proteus species NOT DETECTED NOT DETECTED Final   Serratia marcescens NOT DETECTED NOT DETECTED Final   Haemophilus influenzae NOT DETECTED NOT DETECTED Final   Neisseria meningitidis NOT DETECTED NOT DETECTED Final   Pseudomonas aeruginosa NOT DETECTED NOT DETECTED Final   Candida albicans NOT DETECTED NOT DETECTED Final   Candida glabrata NOT DETECTED NOT DETECTED Final   Candida krusei NOT DETECTED NOT DETECTED Final   Candida parapsilosis NOT DETECTED NOT DETECTED Final   Candida tropicalis NOT DETECTED NOT DETECTED Final    Comment: Performed at [facility], [address] [place], [place] [address]  Blood culture (routine x 2)     Status: None (Preliminary result)   Collection Time: 05/14/2019  1:24 PM   Specimen: BLOOD  Result Value Ref Range Status   Specimen Description BLOOD R LATREAL BICEP  Final   Special Requests   Final    BOTTLES DRAWN AEROBIC AND ANAEROBIC Blood Culture adequate volume   Culture  Setup Time   Final    IN BOTH AEROBIC AND ANAEROBIC BOTTLES GRAM  POSITIVE COCCI CRITICAL VALUE NOTED.  VALUE IS CONSISTENT WITH PREVIOUSLY REPORTED AND CALLED VALUE. Performed at [facility], [address] [place], [place] [address]    Culture [facility] POSITIVE COCCI  Final   Report Status PENDING  Incomplete  Respiratory Panel by RT PCR (Flu A&B, Covid) - Nasopharyngeal Swab     Status: None   Collection Time: 05/11/2019  1:24 PM   Specimen: Nasopharyngeal Swab  Result Value Ref Range Status   SARS Coronavirus 2 by RT PCR NEGATIVE NEGATIVE Final    Comment: (NOTE) SARS-CoV-2 target nucleic acids are NOT DETECTED. The SARS-CoV-2 RNA is generally detectable in upper respiratoy specimens during the acute phase of infection. The lowest concentration of SARS-CoV-2 viral copies this assay can detect is 131 copies/mL. A negative result does not preclude SARS-Cov-2 infection and should not be used as the sole basis for treatment or other patient management decisions. A negative result may occur with  improper specimen collection/handling, submission of specimen other than nasopharyngeal swab, presence of viral mutation(s) within the areas targeted by this assay, and inadequate number of viral copies (<131 copies/mL). A negative result must be combined with clinical  observations, patient history, and epidemiological information. The expected result is Negative. Fact Sheet for Patients:  PinkCheek.be Fact Sheet for Healthcare Providers:  GravelBags.it This test is not yet ap proved or cleared by the [place] FDA and  has been authorized for detection and/or diagnosis of SARS-CoV-2 by FDA under an Emergency Use Authorization (EUA). This EUA will remain  in effect (meaning this test can be used) for the duration of the COVID-19 declaration under Section 564(b)(1) of the Act, 21 U.S.C. section 360bbb-3(b)(1), unless the authorization is terminated or revoked sooner.    Influenza A by PCR  NEGATIVE NEGATIVE Final   Influenza B by PCR NEGATIVE NEGATIVE Final    Comment: (NOTE) The Xpert Xpress SARS-CoV-2/FLU/RSV assay is intended as an aid in  the diagnosis of influenza from Nasopharyngeal swab specimens and  should not be used as a sole basis for treatment. Nasal washings and  aspirates are unacceptable for Xpert Xpress SARS-CoV-2/FLU/RSV  testing. Fact Sheet for Patients: PinkCheek.be Fact Sheet for Healthcare Providers: GravelBags.it This test is not yet approved or cleared by the [place] FDA and  has been authorized for detection and/or diagnosis of SARS-CoV-2 by  FDA under an Emergency Use Authorization (EUA). This EUA will remain  in effect (meaning this test can be used) for the duration of the  Covid-19 declaration under Section 564(b)(1) of the Act, 21  U.S.C. section 360bbb-3(b)(1), unless the authorization is  terminated or revoked. Performed at [facility], [address] [place], [place] [address]   MRSA PCR Screening     Status: None   Collection Time: 05/09/2019  6:27 PM   Specimen: Nasopharyngeal  Result Value Ref Range Status   MRSA by PCR NEGATIVE NEGATIVE Final    Comment:        The GeneXpert MRSA Assay (FDA approved for NASAL specimens only), is one component of a comprehensive MRSA colonization surveillance program. It is not intended to diagnose MRSA infection nor to guide or monitor treatment for MRSA infections. Performed at [facility], [address] [place], [place] [address]     Best Practice/Protocols:  VTE Prophylaxis: Mechanical   Events: 4/3 admitted with shock, altered MS 4/4 Perm cath removed, purulent d/c, MRSA bacteremia  Studies: CT Head Wo Contrast  Result Date: 06/02/2019 CLINICAL DATA:  Headache, head trauma. Altered mental status EXAM: CT HEAD WITHOUT CONTRAST TECHNIQUE: Contiguous axial images were obtained from the base of  the skull through the vertex without intravenous contrast. COMPARISON:  None. FINDINGS: Brain: No acute hemorrhage. No subdural or extra-axial collection. Large chronic right MCA distribution encephalomalacia. Associated ex vacuo dilatation of the right lateral ventricle. Background periventricular chronic small vessel ischemia. No evidence of acute infarct. Vascular: Atherosclerosis of skullbase vasculature without hyperdense vessel or abnormal calcification. Skull: No fracture or focal lesion. Sinuses/Orbits: Paranasal sinuses and mastoid air cells are clear. The visualized orbits are unremarkable. Other: None. IMPRESSION: 1. No acute intracranial abnormality. 2. Large remote right MCA distribution infarct with ex vacuo dilatation of the right lateral ventricle. Background chronic small vessel ischemia. Electronically Signed   By: Keith Rake M.D.   On: 05/31/2019 15:09   CT Cervical Spine Wo Contrast  Result Date: 05/11/2019 CLINICAL DATA:  Polytrauma, critical, head/C-spine injury suspected EXAM: CT CERVICAL SPINE WITHOUT CONTRAST TECHNIQUE: Multidetector CT imaging of the cervical spine was performed without intravenous contrast. Multiplanar CT image reconstructions were also generated. COMPARISON:  None. FINDINGS: Alignment: No evidence of traumatic subluxation. Trace anterolisthesis of C6  on C7 is likely degenerative and facet mediated. Skull base and vertebrae: No acute fracture. Vertebral body heights are maintained. The dens and skull base are intact. Soft tissues and spinal canal: No prevertebral fluid or swelling. No visible canal hematoma. Disc levels: Multilevel endplate spurring with mild disc space narrowing at C5-C6 and C6-C7. Multilevel facet hypertrophy. Upper chest: Right internal jugular dialysis catheter in place. Patulous esophagus containing intraluminal fluid/debris. Minimal patchy opacity in the perifissural left upper lobe, partially included. Other: Carotid calcifications.  IMPRESSION: 1. Degenerative change in the cervical spine without acute fracture or subluxation. 2. Patulous esophagus containing intraluminal fluid/debris. Electronically Signed   By: Keith Rake M.D.   On: 06/02/2019 15:13   DG Chest Port 1 View  Result Date: 05/11/2019 CLINICAL DATA:  Respiratory failure EXAM: PORTABLE CHEST 1 VIEW COMPARISON:  May 10, 2019 FINDINGS: Central catheter tip in right atrium slightly beyond cavoatrial junction. No pneumothorax. There is atelectatic change in the medial right base. Lungs elsewhere clear. There is stable cardiac prominence with pulmonary vascularity normal. There is aortic atherosclerosis. No adenopathy. No bone lesions. IMPRESSION: No change in central catheter placement. No pneumothorax. Medial right base atelectasis. Lungs elsewhere clear. Stable cardiac prominence. Aortic Atherosclerosis (ICD10-I70.0). Electronically Signed   By: Lowella Grip III M.D.   On: 05/11/2019 08:36   DG Chest Portable 1 View  Result Date: 06/02/2019 CLINICAL DATA:  Shortness of breath EXAM: PORTABLE CHEST 1 VIEW COMPARISON:  None. FINDINGS: The heart size and mediastinal contours are within normal limits. Large-bore right neck multi lumen vascular catheter. Both lungs are clear. The visualized skeletal structures are unremarkable. IMPRESSION: No acute abnormality of the lungs in AP portable projection. Electronically Signed   By: Eddie Candle M.D.   On: 06/02/2019 15:13    Consults: Treatment Team:  Lavonia Dana, MD Schnier, Dolores Lory, MD  Michel Bickers, MD  Subjective:    Overnight Issues: Overnight no issues.  As noted PermCath was removed, discussed with Dr. Glynda Jaeger, vascular surgery, catheter was easily removed without any resistance and there was purulence noted.  This is likely the source of the patient's MRSA bacteremia. She has persistent encephalopathy.  Objective:  Vital signs for last 24 hours: Temp:  [97.6 F (36.4 C)-98.1 F (36.7 C)] 97.6 F  (36.4 C) (04/04 1245) Pulse Rate:  [58-79] 65 (04/04 1300) Resp:  [16-26] 24 (04/04 1300) BP: (60-122)/(27-85) 90/67 (04/04 1300) SpO2:  [93 %-100 %] 100 % (04/04 1300) Weight:  [61.8 kg] 61.8 kg (04/03 1820)  Hemodynamic parameters for last 24 hours:    Intake/Output from previous day: 04/03 0701 - 04/04 0700 In: 1683.3 [I.V.:423.1; IV Piggyback:1260.2] Out: 15 [Urine:15]  Intake/Output this shift: Total I/O In: 1779.6 [I.V.:1071; IV Piggyback:708.7] Out: -   Vent settings for last 24 hours:    Physical Exam:  GENERAL: Frail, debilitated appearing woman, confused, sometimes crying out "help me", not following commands readily. HEAD: Normocephalic, atraumatic.  EYES: Pupils equal, round, reactive to light.  No scleral icterus.  MOUTH: Oral mucosa moist. NECK: Supple. No thyromegaly. No nodules. No JVD.  PermCath on right, removed. PULMONARY: Lungs clear to auscultation bilaterally. CARDIOVASCULAR: S1 and S2. Regular rate and rhythm.  GASTROINTESTINAL: Soft abdomen, nondistended, normoactive bowel sounds. MUSCULOSKELETAL: Diminished muscle mass particularly of the lower extremities. NEUROLOGIC: Dense left hemiparesis, confused, no other deficit noted. SKIN: Intact,warm,dry. PSYCH: Cannot assess fully due to confusion.  Flat affect.  Assessment/Plan:  Septic/hypovolemic shock MRSA bacteremia Low oncotic pressure due to hypoalbuminemia Elevated procalcitonin Evidence  of volume depletion MRSA on blood cultures Aggressive volume resuscitation Albumin supplementation Pressors as needed to maintain MAP > 65 or better Continue vancomycin Discontinued dialysis catheter, appreciate vascular surgery's input  Kidney disease on dialysis Hypokalemia Anuric Monitor renal function Avoid nephrotoxins No overt need for dialysis at present Check magnesium and replete as needed Replete potassium Metabolic derangements and electrolyte imbalance may have been accelerated by  dialysis Malnutrition may be aggravating issue, albumin 1.3 Prognosis is poor overall given multiple comorbidities Palliative consultation placed  Severe Sepsis MRSA Bacteremia Elevated procalcitonin D/C'd Perm cath per vascular surgery Catheter had purulence, likely source Continue vancomycin, DC Azactam ID consultation  Toxic metabolic encephalopathy Underlying right hemisphere encephalomalacia from prior right MCA CVA Driven by metabolic derangements MRSA bacteremia  Anemia Macrocytosis Mild thrombocytopenia Not new issue Chronic illness/malnutrition No signs of active bleeding Tranfuse 1 unit PRBCs B12 pending Folate deficient, supplement Iron studies show low iron, likely nutritional deficiency No IV iron in the setting of bacteremia  Severe protein calorie malnutrition Poor p.o. intake due to chronic issues with dysphagia Query sideropenic dysphagia History of Schatzki's ring History of right MCA CVA Folate deficiency, as above Iron deficiency, as above Recommend fluoroscopically placed postpyloric Dobbhoff, reassess in AM Begin nutrition supplementation once enteric tube placed Banana bag with multivits/folate  Severe deconditioning Dense left hemiparesis Bedridden chronically Severe debility Poor prognosis overall   LOS: 1 day   Additional comments: Discussed with Dr. Juleen China, discussed with Dr. Glynda Jaeger and discussed with Dr. Michel Bickers (via secure chat)  Critical Care Total Time*:   C. Derrill Kay, MD [facility] 05/11/2019  *Care during the described time interval was provided by me and/or other providers on the critical care team.  I have reviewed this patient's available data, including medical history, events of note, physical examination and test results as part of my evaluation.  **This note was dictated using voice recognition software/Dragon.  Despite best efforts to proofread, errors can occur which can change the meaning.  Any change  was purely unintentional.

## 2019-05-11 NOTE — Consult Note (Signed)
[facility]  Patient name: Crystal Vargas MRN: [id number] DOB: 08/21/1945 Sex: female   REQUESTING PROVIDER:    Dr. Juleen China   REASON FOR CONSULT:    INfected dialysis catheter  HISTORY OF PRESENT ILLNESS:   Crystal Vargas is a 74 y.o. female, who developed MRSA bacteremia likely secondary to her dialysis catheter.  She is on broad-spectrum antibiotics in the ICU.  Catheter removal has been requested.  The patient is bedbound secondary to right MCA stroke with left-sided hemiparesis.  She is on dialysis Monday Wednesday Friday.  She was admitted with altered mental status, profound hypokalemia and chronic malnutrition PAST MEDICAL HISTORY    Past Medical History:  Diagnosis Date  . Chronic kidney disease    RENAL INSUFFICIENCY  . Coronary artery disease   . Depression   . Diabetes mellitus without complication ([place])   . GERD (gastroesophageal reflux disease)   . Glaucoma   . Hyperlipidemia   . Hypertension   . Stroke [facility])      FAMILY HISTORY   History reviewed. No pertinent family history.  SOCIAL HISTORY:   Social History   Socioeconomic History  . Marital status: Single    Spouse name: Not on file  . Number of children: Not on file  . Years of education: Not on file  . Highest education level: Not on file  Occupational History  . Not on file  Tobacco Use  . Smoking status: Never Smoker  . Smokeless tobacco: Never Used  Substance and Sexual Activity  . Alcohol use: Not Currently  . Drug use: Never  . Sexual activity: Not on file  Other Topics Concern  . Not on file  Social History Narrative  . Not on file   Social Determinants of Health   Financial Resource Strain:   . Difficulty of Paying Living Expenses:   Food Insecurity:   . Worried About Charity fundraiser in the Last Year:   . Arboriculturist in the Last Year:   Transportation Needs:   . Film/video editor (Medical):   Marland Kitchen  Lack of Transportation (Non-Medical):   Physical Activity:   . Days of Exercise per Week:   . Minutes of Exercise per Session:   Stress:   . Feeling of Stress :   Social Connections:   . Frequency of Communication with Friends and Family:   . Frequency of Social Gatherings with Friends and Family:   . Attends Religious Services:   . Active Member of Clubs or Organizations:   . Attends Archivist Meetings:   Marland Kitchen Marital Status:   Intimate Partner Violence:   . Fear of Current or Ex-Partner:   . Emotionally Abused:   Marland Kitchen Physically Abused:   . Sexually Abused:     ALLERGIES:    Allergies  Allergen Reactions  . Abacavir Other (See Comments)  . Cephalosporins Other (See Comments)  . Ciprofloxacin Other (See Comments)  . Nsaids Other (See Comments)  . Penicillin G Other (See Comments)  . Quinolones Other (See Comments)  . Salicylates Other (See Comments)    CURRENT MEDICATIONS:    Current Facility-Administered Medications  Medication Dose Route Frequency Provider Last Rate Last Admin  . albumin human 25 % solution 25 g  25 g Intravenous BID Tyler Pita, MD   Stopped at 05/11/19 1058  . Chlorhexidine Gluconate Cloth 2 % PADS 6 each  6 each Topical Daily Tyler Pita, MD   6  each at 05/11/19 0942  . insulin aspart (novoLOG) injection 0-9 Units  0-9 Units Subcutaneous Q4H Darel Hong D, NP   2 Units at 05/11/19 1221  . norepinephrine (LEVOPHED) 16 mg in 229mL premix infusion  0-40 mcg/min Intravenous Titrated Darel Hong D, NP 9.38 mL/hr at 05/11/19 1437 10 mcg/min at 05/11/19 1437  . sodium bicarbonate 150 mEq in sterile water 1,000 mL infusion   Intravenous Continuous Bradly Bienenstock, NP   Stopped at 05/11/19 1314  . [START ON 05/12/2019] vancomycin (VANCOREADY) IVPB 750 mg/150 mL  750 mg Intravenous Q M,W,F-HD Rito Ehrlich A, RPH      . vasopressin (PITRESSIN) 40 Units in sodium chloride 0.9 % 250 mL (0.16 Units/mL) infusion  0.03 Units/min  Intravenous Continuous Darel Hong D, NP 11.25 mL/hr at 05/11/19 1400 0.03 Units/min at 05/11/19 1400    REVIEW OF SYSTEMS:   [X]  denotes positive finding, [ ]  denotes negative finding Cardiac  Comments:  Chest pain or chest pressure:    Shortness of breath upon exertion:    Short of breath when lying flat:    Irregular heart rhythm:        Vascular    Pain in calf, thigh, or hip brought on by ambulation:    Pain in feet at night that wakes you up from your sleep:     Blood clot in your veins:    Leg swelling:         Pulmonary    Oxygen at home:    Productive cough:     Wheezing:     x    Neurologic    Sudden weakness in arms or legs:  x   Sudden numbness in arms or legs:  x   Sudden onset of difficulty speaking or slurred speech:    Temporary loss of vision in one eye:     Problems with dizziness:         Gastrointestinal    Blood in stool:      Vomited blood:         Genitourinary    Burning when urinating:     Blood in urine:        Psychiatric    Major depression:         Hematologic    Bleeding problems:    Problems with blood clotting too easily:        Skin    Rashes or ulcers:        Constitutional    Fever or chills:     PHYSICAL EXAM:   Vitals:   05/11/19 1255 05/11/19 1300 05/11/19 1400 05/11/19 1500  BP:  90/67 (!) 84/73 110/63  Pulse: 62 65 69   Resp: (!) 22 (!) 24 14 (!) 22  Temp:      TempSrc:      SpO2: 100% 100% 100%   Weight:      Height:        GENERAL: The patient is a well-nourished female, in no acute distress. The vital signs are documented above. CARDIAC: There is a regular rate and rhythm.  VASCULAR: Right tunneled dialysis catheter in place PULMONARY: Nonlabored respirations ABDOMEN: Soft and non-tender  MUSCULOSKELETAL: There are no major deformities or cyanosis. NEUROLOGIC: Left-sided weakness SKIN: There are no ulcers or rashes noted. PSYCHIATRIC: The patient has a normal affect.  STUDIES:    none  ASSESSMENT and PLAN   The patient has developed MRSA bacteremia likely secondary to her indwelling catheter.  Request  for removal has been made.  The patient will require replacement of her catheter when she has had a catheter free holiday.   Leia Alf, MD, FACS [facility] [phone] Pager [phone]

## 2019-05-11 NOTE — Progress Notes (Signed)
[facility]  ROUNDING NOTE   Subjective:   [name] admitted to [facility] for mental status change. Patient was found to have severe hypokalemia and thought that she may have hypokalemic paralysis. Potassium was replaced.  Patient found to have sepsis with MRSA. Started on vasopressors and broad spectrum antibiotics.  Patient with anion gap acidosis and started on sodium bicarbonate infusion.  Patient found to have anemia and thrombocytopenia which is in work up.  Patient seems to have dysphagia.  Last dialysis was 4/2.   Objective:  Vital signs in last 24 hours:  Temp:  [97.6 F (36.4 C)-98.1 F (36.7 C)] 97.9 F (36.6 C) (04/04 0400) Pulse Rate:  [70-81] 71 (04/03 1850) Resp:  [16-31] 21 (04/03 1850) BP: (49-122)/(22-85) 67/34 (04/03 1850) SpO2:  [93 %-100 %] 100 % (04/03 1850) Weight:  [61.8 kg-65.2 kg] 61.8 kg (04/03 1820)  Weight change:  Filed Weights   05/31/2019 1237 05/09/2019 1820  Weight: 65.2 kg 61.8 kg    Intake/Output: I/O last 3 completed shifts: In: 1683.3 [I.V.:423.1; IV Piggyback:1260.2] Out: 15 [Urine:15]   Intake/Output this shift:  Total I/O In: 1779.6 [I.V.:1071; IV Piggyback:708.7] Out: -   Physical Exam: General: Critical ill  Head: Normocephalic, atraumatic. Dry oral mucosal membranes  Eyes: Anicteric, PERRL  Neck: Supple, trachea midline  Lungs:  Clear to auscultation  Heart: Regular rate and rhythm  Abdomen:  Soft, nontender   Extremities:  no peripheral edema.  Neurologic: Left sided weakness. Moving her extremities  Skin: No lesions  Access: RIJ permcath    Basic Metabolic Panel: Recent Labs  Lab 05/26/2019 1324 05/18/2019 1625 05/31/2019 2135 05/11/19 0611  NA 139  --  136 134*  K 2.1*  --  3.5 3.6  CL 107  --  103 100  CO2 20*  --  15* 15*  GLUCOSE 137*  --  202* 261*  BUN 20  --  25* 28*  CREATININE 1.68*  --  2.04* 2.13*  CALCIUM 6.7*  --  7.9* 7.8*  MG  --  1.6*  --  2.3  PHOS  --   --  1.6* 4.6    Liver  Function Tests: Recent Labs  Lab 06/01/2019 1324  AST 26  ALT 14  ALKPHOS 55  BILITOT 1.5*  PROT 3.9*  ALBUMIN 1.3*   Recent Labs  Lab 05/18/2019 1324  LIPASE <10*   Recent Labs  Lab 05/12/2019 1234  AMMONIA 13    CBC: Recent Labs  Lab 05/26/2019 1324 05/11/19 0611  WBC 9.4 8.5  NEUTROABS 7.5  --   HGB 6.9* 7.0*  HCT 21.4* 22.2*  MCV 103.4* 106.2*  PLT 82* 79*    Cardiac Enzymes: Recent Labs  Lab 05/11/2019 1324  CKTOTAL 17*    BNP: Invalid input(s): POCBNP  CBG: Recent Labs  Lab 05/11/19 0025 05/11/19 0529 05/11/19 0730  GLUCAP 194* 234* 225*    Microbiology: Results for orders placed or performed during the hospital encounter of 06/01/2019  Blood culture (routine x 2)     Status: None (Preliminary result)   Collection Time: 05/23/2019 12:34 PM   Specimen: BLOOD  Result Value Ref Range Status   Specimen Description BLOOD R UP ARM  Final   Special Requests   Final    BOTTLES DRAWN AEROBIC AND ANAEROBIC Blood Culture adequate volume   Culture  Setup Time   Final    Organism ID to follow IN BOTH AEROBIC AND ANAEROBIC BOTTLES GRAM POSITIVE COCCI CRITICAL RESULT  CALLED TO, READ BACK BY AND VERIFIED WITH: SCOTT HALL ON 05/11/19 AT 0210 [facility] Performed at [facility], [address] [place], [place] [address]    Culture [facility] POSITIVE COCCI  Final   Report Status PENDING  Incomplete  Blood Culture ID Panel (Reflexed)     Status: Abnormal   Collection Time: 05/26/2019 12:34 PM  Result Value Ref Range Status   Enterococcus species NOT DETECTED NOT DETECTED Final   Listeria monocytogenes NOT DETECTED NOT DETECTED Final   Staphylococcus species DETECTED (A) NOT DETECTED Final    Comment: CRITICAL RESULT CALLED TO, READ BACK BY AND VERIFIED WITH: SCOTT HALL ON 05/11/19 AT 0210 [facility]    Staphylococcus aureus (BCID) DETECTED (A) NOT DETECTED Final    Comment: Methicillin (oxacillin)-resistant Staphylococcus aureus (MRSA). MRSA is predictably resistant to  beta-lactam antibiotics (except ceftaroline). Preferred therapy is vancomycin unless clinically contraindicated. Patient requires contact precautions if  hospitalized. CRITICAL RESULT CALLED TO, READ BACK BY AND VERIFIED WITH: SCOTT HALL ON 05/11/19 AT 0210 [facility]    Methicillin resistance DETECTED (A) NOT DETECTED Final    Comment: CRITICAL RESULT CALLED TO, READ BACK BY AND VERIFIED WITH: SCOTT HALL ON 05/11/19 AT 0210 [facility]    Streptococcus species NOT DETECTED NOT DETECTED Final   Streptococcus agalactiae NOT DETECTED NOT DETECTED Final   Streptococcus pneumoniae NOT DETECTED NOT DETECTED Final   Streptococcus pyogenes NOT DETECTED NOT DETECTED Final   Acinetobacter baumannii NOT DETECTED NOT DETECTED Final   Enterobacteriaceae species NOT DETECTED NOT DETECTED Final   Enterobacter cloacae complex NOT DETECTED NOT DETECTED Final   Escherichia coli NOT DETECTED NOT DETECTED Final   Klebsiella oxytoca NOT DETECTED NOT DETECTED Final   Klebsiella pneumoniae NOT DETECTED NOT DETECTED Final   Proteus species NOT DETECTED NOT DETECTED Final   Serratia marcescens NOT DETECTED NOT DETECTED Final   Haemophilus influenzae NOT DETECTED NOT DETECTED Final   Neisseria meningitidis NOT DETECTED NOT DETECTED Final   Pseudomonas aeruginosa NOT DETECTED NOT DETECTED Final   Candida albicans NOT DETECTED NOT DETECTED Final   Candida glabrata NOT DETECTED NOT DETECTED Final   Candida krusei NOT DETECTED NOT DETECTED Final   Candida parapsilosis NOT DETECTED NOT DETECTED Final   Candida tropicalis NOT DETECTED NOT DETECTED Final    Comment: Performed at [facility], [address] [place], [place] [address]  Blood culture (routine x 2)     Status: None (Preliminary result)   Collection Time: 05/13/2019  1:24 PM   Specimen: BLOOD  Result Value Ref Range Status   Specimen Description BLOOD R LATREAL BICEP  Final   Special Requests   Final    BOTTLES DRAWN AEROBIC AND ANAEROBIC Blood Culture  adequate volume   Culture  Setup Time   Final    IN BOTH AEROBIC AND ANAEROBIC BOTTLES GRAM POSITIVE COCCI CRITICAL VALUE NOTED.  VALUE IS CONSISTENT WITH PREVIOUSLY REPORTED AND CALLED VALUE. Performed at [facility], [address] [place], [place] [address]    Culture [facility] POSITIVE COCCI  Final   Report Status PENDING  Incomplete  Respiratory Panel by RT PCR (Flu A&B, Covid) - Nasopharyngeal Swab     Status: None   Collection Time: 05/27/2019  1:24 PM   Specimen: Nasopharyngeal Swab  Result Value Ref Range Status   SARS Coronavirus 2 by RT PCR NEGATIVE NEGATIVE Final    Comment: (NOTE) SARS-CoV-2 target nucleic acids are NOT DETECTED. The SARS-CoV-2 RNA is generally detectable in upper respiratoy specimens  during the acute phase of infection. The lowest concentration of SARS-CoV-2 viral copies this assay can detect is 131 copies/mL. A negative result does not preclude SARS-Cov-2 infection and should not be used as the sole basis for treatment or other patient management decisions. A negative result may occur with  improper specimen collection/handling, submission of specimen other than nasopharyngeal swab, presence of viral mutation(s) within the areas targeted by this assay, and inadequate number of viral copies (<131 copies/mL). A negative result must be combined with clinical observations, patient history, and epidemiological information. The expected result is Negative. Fact Sheet for Patients:  PinkCheek.be Fact Sheet for Healthcare Providers:  GravelBags.it This test is not yet ap proved or cleared by the [place] FDA and  has been authorized for detection and/or diagnosis of SARS-CoV-2 by FDA under an Emergency Use Authorization (EUA). This EUA will remain  in effect (meaning this test can be used) for the duration of the COVID-19 declaration under Section 564(b)(1) of the Act, 21 U.S.C. section  360bbb-3(b)(1), unless the authorization is terminated or revoked sooner.    Influenza A by PCR NEGATIVE NEGATIVE Final   Influenza B by PCR NEGATIVE NEGATIVE Final    Comment: (NOTE) The Xpert Xpress SARS-CoV-2/FLU/RSV assay is intended as an aid in  the diagnosis of influenza from Nasopharyngeal swab specimens and  should not be used as a sole basis for treatment. Nasal washings and  aspirates are unacceptable for Xpert Xpress SARS-CoV-2/FLU/RSV  testing. Fact Sheet for Patients: PinkCheek.be Fact Sheet for Healthcare Providers: GravelBags.it This test is not yet approved or cleared by the [place] FDA and  has been authorized for detection and/or diagnosis of SARS-CoV-2 by  FDA under an Emergency Use Authorization (EUA). This EUA will remain  in effect (meaning this test can be used) for the duration of the  Covid-19 declaration under Section 564(b)(1) of the Act, 21  U.S.C. section 360bbb-3(b)(1), unless the authorization is  terminated or revoked. Performed at [facility], [address] [place], [place] [address]   MRSA PCR Screening     Status: None   Collection Time: 05/23/2019  6:27 PM   Specimen: Nasopharyngeal  Result Value Ref Range Status   MRSA by PCR NEGATIVE NEGATIVE Final    Comment:        The GeneXpert MRSA Assay (FDA approved for NASAL specimens only), is one component of a comprehensive MRSA colonization surveillance program. It is not intended to diagnose MRSA infection nor to guide or monitor treatment for MRSA infections. Performed at [facility], [address] [place],  [address]     Coagulation Studies: No results for input(s): LABPROT, INR in the last 72 hours.  Urinalysis: Recent Labs    05/17/2019 2230  COLORURINE YELLOW*  LABSPEC 1.023  PHURINE 5.0  GLUCOSEU NEGATIVE  HGBUR SMALL*  BILIRUBINUR NEGATIVE  KETONESUR NEGATIVE  PROTEINUR 100*   NITRITE NEGATIVE  LEUKOCYTESUR MODERATE*      Imaging: CT Head Wo Contrast  Result Date: 05/12/2019 CLINICAL DATA:  Headache, head trauma. Altered mental status EXAM: CT HEAD WITHOUT CONTRAST TECHNIQUE: Contiguous axial images were obtained from the base of the skull through the vertex without intravenous contrast. COMPARISON:  None. FINDINGS: Brain: No acute hemorrhage. No subdural or extra-axial collection. Large chronic right MCA distribution encephalomalacia. Associated ex vacuo dilatation of the right lateral ventricle. Background periventricular chronic small vessel ischemia. No evidence of acute infarct. Vascular: Atherosclerosis of skullbase vasculature without hyperdense vessel or abnormal calcification. Skull:  No fracture or focal lesion. Sinuses/Orbits: Paranasal sinuses and mastoid air cells are clear. The visualized orbits are unremarkable. Other: None. IMPRESSION: 1. No acute intracranial abnormality. 2. Large remote right MCA distribution infarct with ex vacuo dilatation of the right lateral ventricle. Background chronic small vessel ischemia. Electronically Signed   By: Keith Rake M.D.   On: 05/13/2019 15:09   CT Cervical Spine Wo Contrast  Result Date: 05/23/2019 CLINICAL DATA:  Polytrauma, critical, head/C-spine injury suspected EXAM: CT CERVICAL SPINE WITHOUT CONTRAST TECHNIQUE: Multidetector CT imaging of the cervical spine was performed without intravenous contrast. Multiplanar CT image reconstructions were also generated. COMPARISON:  None. FINDINGS: Alignment: No evidence of traumatic subluxation. Trace anterolisthesis of C6 on C7 is likely degenerative and facet mediated. Skull base and vertebrae: No acute fracture. Vertebral body heights are maintained. The dens and skull base are intact. Soft tissues and spinal canal: No prevertebral fluid or swelling. No visible canal hematoma. Disc levels: Multilevel endplate spurring with mild disc space narrowing at C5-C6 and C6-C7.  Multilevel facet hypertrophy. Upper chest: Right internal jugular dialysis catheter in place. Patulous esophagus containing intraluminal fluid/debris. Minimal patchy opacity in the perifissural left upper lobe, partially included. Other: Carotid calcifications. IMPRESSION: 1. Degenerative change in the cervical spine without acute fracture or subluxation. 2. Patulous esophagus containing intraluminal fluid/debris. Electronically Signed   By: Keith Rake M.D.   On: 05/26/2019 15:13   DG Chest Port 1 View  Result Date: 05/11/2019 CLINICAL DATA:  Respiratory failure EXAM: PORTABLE CHEST 1 VIEW COMPARISON:  May 10, 2019 FINDINGS: Central catheter tip in right atrium slightly beyond cavoatrial junction. No pneumothorax. There is atelectatic change in the medial right base. Lungs elsewhere clear. There is stable cardiac prominence with pulmonary vascularity normal. There is aortic atherosclerosis. No adenopathy. No bone lesions. IMPRESSION: No change in central catheter placement. No pneumothorax. Medial right base atelectasis. Lungs elsewhere clear. Stable cardiac prominence. Aortic Atherosclerosis (ICD10-I70.0). Electronically Signed   By: Lowella Grip III M.D.   On: 05/11/2019 08:36   DG Chest Portable 1 View  Result Date: 05/24/2019 CLINICAL DATA:  Shortness of breath EXAM: PORTABLE CHEST 1 VIEW COMPARISON:  None. FINDINGS: The heart size and mediastinal contours are within normal limits. Large-bore right neck multi lumen vascular catheter. Both lungs are clear. The visualized skeletal structures are unremarkable. IMPRESSION: No acute abnormality of the lungs in AP portable projection. Electronically Signed   By: Eddie Candle M.D.   On: 05/23/2019 15:13     Medications:   . albumin human 60 mL/hr at 05/11/19 0923  . norepinephrine (LEVOPHED) Adult infusion 10 mcg/min (05/11/19 5284)  . potassium chloride 10 mEq (05/11/19 0923)  .  sodium bicarbonate (isotonic) infusion in sterile water 75  mL/hr at 05/11/19 0923  . [START ON 05/12/2019] vancomycin    . vasopressin (PITRESSIN) infusion - *FOR SHOCK* 0.03 Units/min (05/11/19 0923)   . Chlorhexidine Gluconate Cloth  6 each Topical Daily  . insulin aspart  0-9 Units Subcutaneous Q4H     Assessment/ Plan:  [name] is a 74 y.o. black female [name] is a 74 y.o. black female with end stage renal disease on hemodialysis, CVA with left hemiparesis, hypertension, hyperlipidemia, GERD, glaucoma, diabetes mellitus type II, coronary artery disease, gout, seizure disorder who was admitted to [facility] on 05/12/2019 for Hypokalemia [E87.6] Shock ([place]) [R57.9] ESRD (end stage renal disease) ([place]) [N18.6] Hypotension, unspecified hypotension type [I95.9] Anemia, unspecified type [D64.9]  [facility] MWF RIJ permcath  63.5kg  1. End Stage Renal Disease: last hemodialysis treatment was Friday. No acute indication for dialysis.  - Will consult vascular to remove tunneled catheter.   2. Hypotension: with MRSA bacteremia/sepsis requiring vasopressors: norepinephrine and vasopressin.  - Empiric vancomycin and aztreonam - Consult vascular, patient will need tunneled catheter removed and a catheter free period.   3. Metabolic acidosis and hypokalemia: lactic acid not elevated.  - sodium bicarbonate infusion - Replace potassium  4. Anemia with chronic kidney disease: with iron deficiency and folate deficiency  5. Secondary Hyperparathyroidism: with hypophosphatemia - replace phosphorus  6. Malnutrition: albumin 1.3. 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   7. Thrombocytopenia: acute drop most likely due to infection. Platelet count was 247K on 3/22    LOS: 1 Emillee Talsma 4/4/202110:28 AM

## 2019-05-11 NOTE — Consult Note (Signed)
PHARMACY CONSULT NOTE - FOLLOW UP  Pharmacy Consult for Electrolyte Monitoring and Replacement   Recent Labs: Potassium (mmol/L)  Date Value  05/11/2019 3.3 (L)   Magnesium (mg/dL)  Date Value  05/11/2019 2.3   Calcium (mg/dL)  Date Value  05/11/2019 7.8 (L)   Albumin (g/dL)  Date Value  05/15/2019 1.3 (L)   Phosphorus (mg/dL)  Date Value  05/11/2019 4.6   Sodium (mmol/L)  Date Value  05/11/2019 134 (L)  Corrected calcium: 8.9 mg/dL   Assessment: Patient is a 74 y/o F with a history of ESRD on HD, diabetes, seizures, CVA who is admitted with sepsis. Patient was last dialyzed Friday (4/2).   ABG with pH 7.29. Labs significant for hypokalemia with a K of 2.1 and hypomagnesemia with Mg 1.6.  Per chart several labs were hemolyzed. Originally ordered Kcl 10 mEq x 6. Per chart: RN discussed with MD to give 2/6 ordered bags and only administer remaining 4 after BMP is re-drawn. Will re-order KCl 10 mEq x 2, d/c original order, and follow-up potassium on BMP.   Goal of Therapy:  Electrolytes within normal limits  Plan:  K 3.3, will order KCL 19mEq IV x 4 doses.  Will recheck electrolytes with AM labs.  [name] 05/11/2019 5:26 PM

## 2019-05-11 NOTE — Consult Note (Signed)
Pharmacy Antibiotic Note  Crystal Vargas is a 74 y.o. female admitted on 06/05/2019 with sepsis.  Pharmacy has been consulted for Vancomycin and Aztreonam dosing. Patient receives hemodialysis on Monday, Wednesday and Fridays, with the last session being yesterday.  4/4 BCID: 4 of 4 bottles + for GPC, staph aureus, MecA detected  Plan: Will order Vancomycin 750mg  IV to be given on days that the patient receives dialysis(currently ordered for Mon/Wedn/Friday).   Will follow up with nephrologist to see what inpatient plan may be.   Azactam 1g Q24 hours  Height: 5\' 2"  (157.5 cm) Weight: 61.8 kg (136 lb 3.9 oz) IBW/kg (Calculated) : 50.1  Temp (24hrs), Avg:97.9 F (36.6 C), Min:97.6 F (36.4 C), Max:98.1 F (36.7 C)  Recent Labs  Lab 06/05/2019 1324 05/16/2019 2135 05/17/2019 2306 05/11/19 0611  WBC 9.4  --   --  8.5  CREATININE 1.68* 2.04*  --  2.13*  LATICACIDVEN 1.9  --  1.5 1.4    Estimated Creatinine Clearance: 19.4 mL/min (A) (by C-G formula based on SCr of 2.13 mg/dL (H)).    Allergies  Allergen Reactions  . Abacavir Other (See Comments)  . Cephalosporins Other (See Comments)  . Ciprofloxacin Other (See Comments)  . Nsaids Other (See Comments)  . Penicillin G Other (See Comments)  . Quinolones Other (See Comments)  . Salicylates Other (See Comments)    Antimicrobials this admission: Vancomycin 4/3 >>  Azactam 4/3 >>   Microbiology results: 4/3 BCx: 4 of 4 bottles + for GPC, staph sureus, MecA detected 4/3 UCx: pending  4/3 MRSA PCR: negative  Thank you for allowing pharmacy to be a part of this patient's care.  Leeman Johnsey A 05/11/2019 10:13 AM

## 2019-05-11 NOTE — Progress Notes (Signed)
PHARMACY - PHYSICIAN COMMUNICATION CRITICAL VALUE ALERT - BLOOD CULTURE IDENTIFICATION (BCID)  Crystal Vargas is an 74 y.o. female who presented to [facility] on 05/08/2019 with a chief complaint of sepsis  Assessment:  Lab reports 4 of 4 bottles + for GPC, staph sureus, MecA detected  Name of physician (or Provider) Contacted: Jonny Ruiz, NP  Current antibiotics: Vancomycin, Aztreonam  Changes to prescribed antibiotics recommended:  Patient is on recommended antibiotics - No changes needed  Results for orders placed or performed during the hospital encounter of 05/11/2019  Blood Culture ID Panel (Reflexed) (Collected: 05/20/2019 12:34 PM)  Result Value Ref Range   Enterococcus species NOT DETECTED NOT DETECTED   Listeria monocytogenes NOT DETECTED NOT DETECTED   Staphylococcus species DETECTED (A) NOT DETECTED   Staphylococcus aureus (BCID) DETECTED (A) NOT DETECTED   Methicillin resistance DETECTED (A) NOT DETECTED   Streptococcus species NOT DETECTED NOT DETECTED   Streptococcus agalactiae NOT DETECTED NOT DETECTED   Streptococcus pneumoniae NOT DETECTED NOT DETECTED   Streptococcus pyogenes NOT DETECTED NOT DETECTED   Acinetobacter baumannii NOT DETECTED NOT DETECTED   Enterobacteriaceae species NOT DETECTED NOT DETECTED   Enterobacter cloacae complex NOT DETECTED NOT DETECTED   Escherichia coli NOT DETECTED NOT DETECTED   Klebsiella oxytoca NOT DETECTED NOT DETECTED   Klebsiella pneumoniae NOT DETECTED NOT DETECTED   Proteus species NOT DETECTED NOT DETECTED   Serratia marcescens NOT DETECTED NOT DETECTED   Haemophilus influenzae NOT DETECTED NOT DETECTED   Neisseria meningitidis NOT DETECTED NOT DETECTED   Pseudomonas aeruginosa NOT DETECTED NOT DETECTED   Candida albicans NOT DETECTED NOT DETECTED   Candida glabrata NOT DETECTED NOT DETECTED   Candida krusei NOT DETECTED NOT DETECTED   Candida parapsilosis NOT DETECTED NOT DETECTED   Candida tropicalis NOT DETECTED NOT DETECTED     Hart Robinsons A 05/11/2019  2:36 AM

## 2019-05-12 DIAGNOSIS — E8809 Other disorders of plasma-protein metabolism, not elsewhere classified: Secondary | ICD-10-CM

## 2019-05-12 DIAGNOSIS — A419 Sepsis, unspecified organism: Secondary | ICD-10-CM

## 2019-05-12 DIAGNOSIS — E1122 Type 2 diabetes mellitus with diabetic chronic kidney disease: Secondary | ICD-10-CM | POA: Diagnosis not present

## 2019-05-12 DIAGNOSIS — Z515 Encounter for palliative care: Secondary | ICD-10-CM | POA: Diagnosis not present

## 2019-05-12 DIAGNOSIS — K0889 Other specified disorders of teeth and supporting structures: Secondary | ICD-10-CM

## 2019-05-12 DIAGNOSIS — Z7189 Other specified counseling: Secondary | ICD-10-CM

## 2019-05-12 DIAGNOSIS — N186 End stage renal disease: Secondary | ICD-10-CM

## 2019-05-12 DIAGNOSIS — Z886 Allergy status to analgesic agent status: Secondary | ICD-10-CM

## 2019-05-12 DIAGNOSIS — Z881 Allergy status to other antibiotic agents status: Secondary | ICD-10-CM

## 2019-05-12 DIAGNOSIS — B9562 Methicillin resistant Staphylococcus aureus infection as the cause of diseases classified elsewhere: Secondary | ICD-10-CM

## 2019-05-12 DIAGNOSIS — R6521 Severe sepsis with septic shock: Secondary | ICD-10-CM

## 2019-05-12 DIAGNOSIS — R7881 Bacteremia: Secondary | ICD-10-CM

## 2019-05-12 DIAGNOSIS — T827XXA Infection and inflammatory reaction due to other cardiac and vascular devices, implants and grafts, initial encounter: Secondary | ICD-10-CM

## 2019-05-12 DIAGNOSIS — Z992 Dependence on renal dialysis: Secondary | ICD-10-CM

## 2019-05-12 DIAGNOSIS — Z888 Allergy status to other drugs, medicaments and biological substances status: Secondary | ICD-10-CM

## 2019-05-12 DIAGNOSIS — Z880 Allergy status to penicillin: Secondary | ICD-10-CM

## 2019-05-12 DIAGNOSIS — D649 Anemia, unspecified: Secondary | ICD-10-CM

## 2019-05-12 DIAGNOSIS — I69354 Hemiplegia and hemiparesis following cerebral infarction affecting left non-dominant side: Secondary | ICD-10-CM

## 2019-05-12 DIAGNOSIS — D696 Thrombocytopenia, unspecified: Secondary | ICD-10-CM

## 2019-05-12 DIAGNOSIS — G510 Bell's palsy: Secondary | ICD-10-CM

## 2019-05-12 LAB — MAGNESIUM: Magnesium: 2 mg/dL (ref 1.7–2.4)

## 2019-05-12 LAB — GLUCOSE, CAPILLARY
Glucose-Capillary: 138 mg/dL — ABNORMAL HIGH (ref 70–99)
Glucose-Capillary: 153 mg/dL — ABNORMAL HIGH (ref 70–99)
Glucose-Capillary: 154 mg/dL — ABNORMAL HIGH (ref 70–99)
Glucose-Capillary: 119 mg/dL — ABNORMAL HIGH (ref 70–99)
Glucose-Capillary: 116 mg/dL — ABNORMAL HIGH (ref 70–99)
Glucose-Capillary: 119 mg/dL — ABNORMAL HIGH (ref 70–99)

## 2019-05-12 LAB — BASIC METABOLIC PANEL
Anion gap: 11 (ref 5–15)
BUN: 30 mg/dL — ABNORMAL HIGH (ref 8–23)
CO2: 21 mmol/L — ABNORMAL LOW (ref 22–32)
Calcium: 8.5 mg/dL — ABNORMAL LOW (ref 8.9–10.3)
Chloride: 103 mmol/L (ref 98–111)
Creatinine, Ser: 2.47 mg/dL — ABNORMAL HIGH (ref 0.44–1.00)
GFR calc Af Amer: 21 mL/min — ABNORMAL LOW (ref >60)
GFR calc non Af Amer: 18 mL/min — ABNORMAL LOW (ref >60)
Glucose, Bld: 157 mg/dL — ABNORMAL HIGH (ref 70–99)
Potassium: 4.2 mmol/L (ref 3.5–5.1)
Sodium: 135 mmol/L (ref 135–145)

## 2019-05-12 LAB — CBC
HCT: 27.6 % — ABNORMAL LOW (ref 36.0–46.0)
Hemoglobin: 9.5 g/dL — ABNORMAL LOW (ref 12.0–15.0)
MCH: 33.1 pg (ref 26.0–34.0)
MCHC: 34.4 g/dL (ref 30.0–36.0)
MCV: 96.2 fL (ref 80.0–100.0)
Platelets: 80 10*3/uL — ABNORMAL LOW (ref 150–400)
RBC: 2.87 MIL/uL — ABNORMAL LOW (ref 3.87–5.11)
RDW: 19.1 % — ABNORMAL HIGH (ref 11.5–15.5)
WBC: 9.9 10*3/uL (ref 4.0–10.5)
nRBC: 0.4 % — ABNORMAL HIGH (ref 0.0–0.2)

## 2019-05-12 LAB — PROCALCITONIN: Procalcitonin: 45.92 ng/mL

## 2019-05-12 LAB — ECHOCARDIOGRAM COMPLETE
Height: 62 in
Weight: 2179.91 oz

## 2019-05-12 LAB — VANCOMYCIN, RANDOM: Vancomycin Rm: 17

## 2019-05-12 LAB — PHOSPHORUS: Phosphorus: 3.6 mg/dL (ref 2.5–4.6)

## 2019-05-12 MED ORDER — VANCOMYCIN VARIABLE DOSE PER UNSTABLE RENAL FUNCTION (PHARMACIST DOSING): Status: DC

## 2019-05-12 MED ORDER — VANCOMYCIN HCL 750 MG/150ML IV SOLN
750.0000 mg | Freq: Once | INTRAVENOUS | Status: AC
  Administered 2019-05-12: 750 mg via INTRAVENOUS
  Filled 2019-05-12: qty 150

## 2019-05-12 MED ORDER — PANTOPRAZOLE SODIUM 40 MG IV SOLR
40.0000 mg | Freq: Every day | INTRAVENOUS | Status: DC
  Administered 2019-05-12 – 2019-05-22 (×11): 40 mg via INTRAVENOUS
  Filled 2019-05-12 (×11): qty 40

## 2019-05-12 MED ORDER — MORPHINE SULFATE (PF) 2 MG/ML IV SOLN
1.0000 mg | INTRAVENOUS | Status: DC | PRN
  Administered 2019-05-12: 1 mg via INTRAVENOUS
  Administered 2019-05-15 – 2019-05-16 (×2): 2 mg via INTRAVENOUS
  Filled 2019-05-12 (×3): qty 1

## 2019-05-12 MED ORDER — HEPARIN SODIUM (PORCINE) 5000 UNIT/ML IJ SOLN
5000.0000 [IU] | Freq: Three times a day (TID) | INTRAMUSCULAR | Status: DC
  Administered 2019-05-12 – 2019-05-13 (×3): 5000 [IU] via SUBCUTANEOUS
  Filled 2019-05-12 (×4): qty 1

## 2019-05-12 NOTE — Progress Notes (Signed)
[facility]  ROUNDING NOTE   Subjective:   Patient unable to answer questions or follow commands.  PRBC transfusion yesterday.   Remains on norepinephrine and vasopressin.   Palliative care consulted today. Family is requesting transfer to [facility].   Objective:  Vital signs in last 24 hours:  Temp:  [97.3 F (36.3 C)-98.1 F (36.7 C)] 97.8 F (36.6 C) (04/05 1200) Pulse Rate:  [54-66] 64 (04/05 1500) Resp:  [14-25] 14 (04/05 1500) BP: (98-117)/(62-88) 112/88 (04/05 1500) SpO2:  [95 %-100 %] 99 % (04/05 1500) Weight:  [66.8 kg] 66.8 kg (04/05 0500)  Weight change: 1.6 kg Filed Weights   06/04/2019 1237 05/09/2019 1820 05/12/19 0500  Weight: 65.2 kg 61.8 kg 66.8 kg    Intake/Output: I/O last 3 completed shifts: In: 3368.3 [I.V.:1918.3; Blood:290; IV Piggyback:1160.1] Out: 15 [Urine:15]   Intake/Output this shift:  Total I/O In: 253.1 [I.V.:153; IV Piggyback:100] Out: -   Physical Exam: General: Critical ill  Head: Normocephalic, atraumatic. Dry oral mucosal membranes  Eyes: Anicteric, PERRL  Neck: Supple, trachea midline  Lungs:  Clear to auscultation  Heart: Regular rate and rhythm  Abdomen:  Soft, nontender   Extremities:  no peripheral edema.  Neurologic: Left sided weakness. Moving her extremities  Skin: No lesions  Access: RIJ permcath    Basic Metabolic Panel: Recent Labs  Lab 05/13/2019 1324 05/25/2019 1324 05/30/2019 1625 06/04/2019 2135 05/11/19 0611 05/11/19 1627 05/12/19 0502  NA 139  --   --  136 134*  --  135  K 2.1*  --   --  3.5 3.6 3.3* 4.2  CL 107  --   --  103 100  --  103  CO2 20*  --   --  15* 15*  --  21*  GLUCOSE 137*  --   --  202* 261*  --  157*  BUN 20  --   --  25* 28*  --  30*  CREATININE 1.68*  --   --  2.04* 2.13*  --  2.47*  CALCIUM 6.7*   < >  --  7.9* 7.8*  --  8.5*  MG  --   --  1.6*  --  2.3  --  2.0  PHOS  --   --   --  1.6* 4.6  --  3.6   < > = values in this interval not displayed.    Liver Function  Tests: Recent Labs  Lab 06/01/2019 1324  AST 26  ALT 14  ALKPHOS 55  BILITOT 1.5*  PROT 3.9*  ALBUMIN 1.3*   Recent Labs  Lab 05/11/2019 1324  LIPASE <10*   Recent Labs  Lab 06/02/2019 1234  AMMONIA 13    CBC: Recent Labs  Lab 05/30/2019 1324 05/11/19 0611 05/11/19 1627 05/12/19 0607  WBC 9.4 8.5  --  9.9  NEUTROABS 7.5  --   --   --   HGB 6.9* 7.0* 8.0* 9.5*  HCT 21.4* 22.2* 23.9* 27.6*  MCV 103.4* 106.2*  --  96.2  PLT 82* 79*  --  80*    Cardiac Enzymes: Recent Labs  Lab 05/12/2019 1324  CKTOTAL 17*    BNP: Invalid input(s): POCBNP  CBG: Recent Labs  Lab 05/11/19 2352 05/12/19 0450 05/12/19 0717 05/12/19 1129 05/12/19 1457  GLUCAP 150* 138* 153* 154* 119*    Microbiology: Results for orders placed or performed during the hospital encounter of 05/09/2019  Blood culture (routine x 2)     Status: Abnormal (  Preliminary result)   Collection Time: 05/20/2019 12:34 PM   Specimen: BLOOD  Result Value Ref Range Status   Specimen Description   Final    BLOOD R UP ARM Performed at [facility], [address] [place], [place] [address]    Special Requests   Final    BOTTLES DRAWN AEROBIC AND ANAEROBIC Blood Culture adequate volume Performed at [facility], [address] [place], [place] [address]    Culture  Setup Time   Final    Organism ID to follow IN BOTH AEROBIC AND ANAEROBIC BOTTLES GRAM POSITIVE COCCI CRITICAL RESULT CALLED TO, READ BACK BY AND VERIFIED WITH: [place] ON 05/11/19 AT 0210 [place] Performed at [facility], [address] [place], [place] [address]    Culture STAPHYLOCOCCUS AUREUS (A)  Final   Report Status PENDING  Incomplete  Blood Culture ID Panel (Reflexed)     Status: Abnormal   Collection Time: 05/31/2019 12:34 PM  Result Value Ref Range Status   Enterococcus species NOT DETECTED NOT DETECTED Final   Listeria monocytogenes NOT DETECTED NOT DETECTED Final   Staphylococcus species DETECTED (A) NOT  DETECTED Final    Comment: CRITICAL RESULT CALLED TO, READ BACK BY AND VERIFIED WITH: [place] HALL ON 05/11/19 AT 0210 [facility]    Staphylococcus aureus (BCID) DETECTED (A) NOT DETECTED Final    Comment: Methicillin (oxacillin)-resistant Staphylococcus aureus (MRSA). MRSA is predictably resistant to beta-lactam antibiotics (except ceftaroline). Preferred therapy is vancomycin unless clinically contraindicated. Patient requires contact precautions if  hospitalized. CRITICAL RESULT CALLED TO, READ BACK BY AND VERIFIED WITH: [place] HALL ON 05/11/19 AT 0210 [facility]    Methicillin resistance DETECTED (A) NOT DETECTED Final    Comment: CRITICAL RESULT CALLED TO, READ BACK BY AND VERIFIED WITH: [place] HALL ON 05/11/19 AT 0210 [place]    Streptococcus species NOT DETECTED NOT DETECTED Final   Streptococcus agalactiae NOT DETECTED NOT DETECTED Final   Streptococcus pneumoniae NOT DETECTED NOT DETECTED Final   Streptococcus pyogenes NOT DETECTED NOT DETECTED Final   Acinetobacter baumannii NOT DETECTED NOT DETECTED Final   Enterobacteriaceae species NOT DETECTED NOT DETECTED Final   Enterobacter cloacae complex NOT DETECTED NOT DETECTED Final   Escherichia coli NOT DETECTED NOT DETECTED Final   Klebsiella oxytoca NOT DETECTED NOT DETECTED Final   Klebsiella pneumoniae NOT DETECTED NOT DETECTED Final   Proteus species NOT DETECTED NOT DETECTED Final   Serratia marcescens NOT DETECTED NOT DETECTED Final   Haemophilus influenzae NOT DETECTED NOT DETECTED Final   Neisseria meningitidis NOT DETECTED NOT DETECTED Final   Pseudomonas aeruginosa NOT DETECTED NOT DETECTED Final   Candida albicans NOT DETECTED NOT DETECTED Final   Candida glabrata NOT DETECTED NOT DETECTED Final   Candida krusei NOT DETECTED NOT DETECTED Final   Candida parapsilosis NOT DETECTED NOT DETECTED Final   Candida tropicalis NOT DETECTED NOT DETECTED Final    Comment: Performed at [facility], [address] [place], [place] [address]   Blood culture (routine x 2)     Status: Abnormal (Preliminary result)   Collection Time: 05/24/2019  1:24 PM   Specimen: BLOOD  Result Value Ref Range Status   Specimen Description   Final    BLOOD R LATREAL BICEP Performed at [facility], [address], [place], [place] [address]    Special Requests   Final    BOTTLES DRAWN AEROBIC AND ANAEROBIC Blood Culture adequate volume Performed at [facility], [place],  [place] [address]    Culture  Setup Time   Final    IN BOTH AEROBIC AND ANAEROBIC BOTTLES GRAM POSITIVE COCCI CRITICAL VALUE NOTED.  VALUE IS CONSISTENT WITH PREVIOUSLY REPORTED AND CALLED VALUE. Performed at [facility], [address], [place], [place] [address]    Culture (A)  Final    STAPHYLOCOCCUS AUREUS SUSCEPTIBILITIES TO FOLLOW Performed at [facility], [place] [address] [place], [place] [address]    Report Status PENDING  Incomplete  Respiratory Panel by RT PCR (Flu A&B, Covid) - Nasopharyngeal Swab     Status: None   Collection Time: 05/09/2019  1:24 PM   Specimen: Nasopharyngeal Swab  Result Value Ref Range Status   SARS Coronavirus 2 by RT PCR NEGATIVE NEGATIVE Final    Comment: (NOTE) SARS-CoV-2 target nucleic acids are NOT DETECTED. The SARS-CoV-2 RNA is generally detectable in upper respiratoy specimens during the acute phase of infection. The lowest concentration of SARS-CoV-2 viral copies this assay can detect is 131 copies/mL. A negative result does not preclude SARS-Cov-2 infection and should not be used as the sole basis for treatment or other patient management decisions. A negative result may occur with  improper specimen collection/handling, submission of specimen other than nasopharyngeal swab, presence of viral mutation(s) within the areas targeted by this assay, and inadequate number of viral copies (<131 copies/mL). A negative result must be combined with clinical observations, patient  history, and epidemiological information. The expected result is Negative. Fact Sheet for Patients:  PinkCheek.be Fact Sheet for Healthcare Providers:  GravelBags.it This test is not yet ap proved or cleared by the [place] FDA and  has been authorized for detection and/or diagnosis of SARS-CoV-2 by FDA under an Emergency Use Authorization (EUA). This EUA will remain  in effect (meaning this test can be used) for the duration of the COVID-19 declaration under Section 564(b)(1) of the Act, 21 U.S.C. section 360bbb-3(b)(1), unless the authorization is terminated or revoked sooner.    Influenza A by PCR NEGATIVE NEGATIVE Final   Influenza B by PCR NEGATIVE NEGATIVE Final    Comment: (NOTE) The Xpert Xpress SARS-CoV-2/FLU/RSV assay is intended as an aid in  the diagnosis of influenza from Nasopharyngeal swab specimens and  should not be used as a sole basis for treatment. Nasal washings and  aspirates are unacceptable for Xpert Xpress SARS-CoV-2/FLU/RSV  testing. Fact Sheet for Patients: PinkCheek.be Fact Sheet for Healthcare Providers: GravelBags.it This test is not yet approved or cleared by the [place] FDA and  has been authorized for detection and/or diagnosis of SARS-CoV-2 by  FDA under an Emergency Use Authorization (EUA). This EUA will remain  in effect (meaning this test can be used) for the duration of the  Covid-19 declaration under Section 564(b)(1) of the Act, 21  U.S.C. section 360bbb-3(b)(1), unless the authorization is  terminated or revoked. Performed at [facility], [address] [place], [place] [address]   MRSA PCR Screening     Status: None   Collection Time: 05/12/2019  6:27 PM   Specimen: Nasopharyngeal  Result Value Ref Range Status   MRSA by PCR NEGATIVE NEGATIVE Final    Comment:        The GeneXpert MRSA Assay  (FDA approved for NASAL specimens only), is one component of a comprehensive MRSA colonization surveillance program. It is not intended to diagnose MRSA infection nor to guide or monitor treatment for MRSA infections. Performed at [facility], [address] [place],  [place] [address]   Urine culture     Status: Abnormal (Preliminary result)   Collection Time: 05/23/2019 10:30 PM   Specimen: Urine, Clean Catch  Result Value Ref Range Status   Specimen Description   Final    URINE, CLEAN CATCH Performed at [facility], [address], [place], [place] [address]    Special Requests   Final    NONE Performed at [facility], [address] [place], [place] [address]    Culture >=100,000 COLONIES/mL KLEBSIELLA PNEUMONIAE (A)  Final   Report Status PENDING  Incomplete  Cath Tip Culture     Status: Abnormal (Preliminary result)   Collection Time: 05/11/19  1:10 PM   Specimen: Catheter Tip; Other  Result Value Ref Range Status   Specimen Description   Final    CATH TIP Performed at [facility], [address] [place], [place] [address]    Special Requests   Final    NONE Performed at [facility], [address] [place], [place] [address]    Culture (A)  Final    >=100,000 COLONIES/mL STAPHYLOCOCCUS AUREUS SUSCEPTIBILITIES TO FOLLOW Performed at [facility], [place] [address] [place], [place] [address]    Report Status PENDING  Incomplete  Culture, blood (routine x 2)     Status: None (Preliminary result)   Collection Time: 05/11/19  4:27 PM   Specimen: BLOOD  Result Value Ref Range Status   Specimen Description BLOOD A-LINE  Final   Special Requests   Final    BOTTLES DRAWN AEROBIC AND ANAEROBIC Blood Culture adequate volume   Culture   Final    NO GROWTH < 24 HOURS Performed at [facility], [address] [place], [place] [address]    Report Status PENDING  Incomplete    Coagulation  Studies: No results for input(s): LABPROT, INR in the last 72 hours.  Urinalysis: Recent Labs    05/17/2019 2230  COLORURINE YELLOW*  LABSPEC 1.023  PHURINE 5.0  GLUCOSEU NEGATIVE  HGBUR SMALL*  BILIRUBINUR NEGATIVE  KETONESUR NEGATIVE  PROTEINUR 100*  NITRITE NEGATIVE  LEUKOCYTESUR MODERATE*      Imaging: DG Chest Port 1 View  Result Date: 05/11/2019 CLINICAL DATA:  Respiratory failure EXAM: PORTABLE CHEST 1 VIEW COMPARISON:  May 10, 2019 FINDINGS: Central catheter tip in right atrium slightly beyond cavoatrial junction. No pneumothorax. There is atelectatic change in the medial right base. Lungs elsewhere clear. There is stable cardiac prominence with pulmonary vascularity normal. There is aortic atherosclerosis. No adenopathy. No bone lesions. IMPRESSION: No change in central catheter placement. No pneumothorax. Medial right base atelectasis. Lungs elsewhere clear. Stable cardiac prominence. Aortic Atherosclerosis (ICD10-I70.0). Electronically Signed   By: Lowella Grip III M.D.   On: 05/11/2019 08:36   ECHOCARDIOGRAM COMPLETE  Result Date: 05/12/2019    ECHOCARDIOGRAM REPORT   Patient Name:   RESSIE Kenley Date of Exam: 05/11/2019 Medical Rec #:  [id number]  Height:       62.0 in Accession #:    [id number] Weight:       136.2 lb Date of Birth:  1945-05-25  BSA:          1.624 m Patient Age:    74 years   BP:           100/63 mmHg Patient Gender: F          HR:           64 bpm. Exam Location:  [facility] Procedure: 2D Echo Indications:     Bacteremia 790.7/ R78.81  History:         Patient has no prior history of Echocardiogram examinations.  Sonographer:     Bennet Referring Phys:  [id number] Bradly Bienenstock Diagnosing Phys: Bartholome Bill MD IMPRESSIONS  1. Left ventricular ejection fraction, by estimation, is 45 to 50%. The left ventricle has mildly decreased function. The left ventricle has no regional wall motion abnormalities. There is moderate concentric left ventricular  hypertrophy. Left ventricular diastolic parameters are consistent with Grade I diastolic dysfunction (impaired relaxation).  2. Right ventricular systolic function is normal. The right ventricular size is mildly enlarged. There is moderately elevated pulmonary artery systolic pressure.  3. Left atrial size was mildly dilated.  4. Right atrial size was mildly dilated.  5. The mitral valve was not well visualized. Mild to moderate mitral valve regurgitation.  6. Tricuspid valve regurgitation is moderate to severe.  7. The aortic valve is grossly normal. Aortic valve regurgitation is trivial. No aortic stenosis is present. FINDINGS  Left Ventricle: Left ventricular ejection fraction, by estimation, is 45 to 50%. The left ventricle has mildly decreased function. The left ventricle has no regional wall motion abnormalities. The left ventricular internal cavity size was normal in size. There is moderate concentric left ventricular hypertrophy. Left ventricular diastolic parameters are consistent with Grade I diastolic dysfunction (impaired relaxation). Right Ventricle: The right ventricular size is mildly enlarged. No increase in right ventricular wall thickness. Right ventricular systolic function is normal. There is moderately elevated pulmonary artery systolic pressure. Left Atrium: Left atrial size was mildly dilated. Right Atrium: Right atrial size was mildly dilated. Pericardium: There is no evidence of pericardial effusion. Mitral Valve: The mitral valve was not well visualized. Mild to moderate mitral valve regurgitation. Tricuspid Valve: The tricuspid valve is not well visualized. Tricuspid valve regurgitation is moderate to severe. Aortic Valve: The aortic valve is grossly normal. Aortic valve regurgitation is trivial. No aortic stenosis is present. Aortic valve peak gradient measures 3.5 mmHg. Pulmonic Valve: The pulmonic valve was not well visualized. Pulmonic valve regurgitation is trivial. Aorta: The aortic  root was not well visualized. IAS/Shunts: The interatrial septum was not assessed.  LEFT VENTRICLE PLAX 2D LVIDd:         3.67 cm     Diastology LVIDs:         2.86 cm     LV e' lateral:   6.74 cm/s LV PW:         1.27 cm     LV E/e' lateral: 14.4 LV IVS:        1.31 cm     LV e' medial:    5.44 cm/s LVOT diam:     1.90 cm     LV E/e' medial:  17.8 LV SV:         37 LV SV Index:   23 LVOT Area:     2.84 cm  LV Volumes (MOD) LV vol d, MOD A2C: 63.1 ml LV vol d, MOD A4C: 49.4 ml LV vol s, MOD A2C: 28.0 ml LV vol s, MOD A4C: 26.7 ml LV SV MOD A2C:     35.1 ml LV SV MOD A4C:     49.4 ml LV SV MOD BP:      28.7 ml RIGHT VENTRICLE RV Basal diam:  2.77 cm RV S prime:     7.72 cm/s TAPSE (M-mode): 1.2 cm LEFT ATRIUM  Index       RIGHT ATRIUM           Index LA diam:        3.60 cm 2.22 cm/m  RA Area:     15.60 cm LA Vol (A2C):   49.1 ml 30.24 ml/m RA Volume:   38.80 ml  23.89 ml/m LA Vol (A4C):   57.1 ml 35.16 ml/m LA Biplane Vol: 58.2 ml 35.84 ml/m  AORTIC VALVE                PULMONIC VALVE AV Area (Vmax): 1.93 cm    PV Vmax:       0.62 m/s AV Vmax:        93.50 cm/s  PV Peak grad:  1.6 mmHg AV Peak Grad:   3.5 mmHg LVOT Vmax:      63.50 cm/s LVOT Vmean:     38.700 cm/s LVOT VTI:       0.132 m  AORTA Ao Root diam: 2.50 cm MITRAL VALVE               TRICUSPID VALVE MV Area (PHT): 2.32 cm    TV Peak grad:   36.8 mmHg MV Decel Time: 327 msec    TV Vmax:        3.03 m/s MV E velocity: 97.00 cm/s MV A velocity: 44.60 cm/s  SHUNTS MV E/A ratio:  2.17        Systemic VTI:  0.13 m                            Systemic Diam: 1.90 cm Bartholome Bill MD Electronically signed by Bartholome Bill MD Signature Date/Time: 05/12/2019/7:18:18 AM    Final      Medications:   . albumin human Stopped (05/12/19 1142)  . norepinephrine (LEVOPHED) Adult infusion 4 mcg/min (05/12/19 1449)  . vasopressin (PITRESSIN) infusion - *FOR SHOCK* 0.03 Units/min (05/12/19 1449)   . Chlorhexidine Gluconate Cloth  6 each Topical Daily  .  insulin aspart  0-9 Units Subcutaneous Q4H  . vancomycin variable dose per unstable renal function (pharmacist dosing)   Does not apply See admin instructions     Assessment/ Plan:  [name] is a 74 y.o. black female [name] is a 74 y.o. black female with end stage renal disease on hemodialysis, CVA with left hemiparesis, hypertension, hyperlipidemia, GERD, glaucoma, diabetes mellitus type II, coronary artery disease, gout, seizure disorder who was admitted to [facility] on 05/19/2019 for Hypokalemia [E87.6] Shock ([place]) [R57.9] ESRD (end stage renal disease) ([place]) [N18.6] Hypotension, unspecified hypotension type [I95.9] Anemia, unspecified type [D64.9]  [facility] MWF RIJ permcath 63.5kg  1. End Stage Renal Disease: last hemodialysis treatment was Friday. No acute indication for dialysis.  Currently without dialysis catheter.   2. Hypotension: with MRSA bacteremia/sepsis requiring vasopressors: norepinephrine and vasopressin. Source is most likely the permcath.  - Empiric vancomycin and aztreonam - Consult vascular, patient will need tunneled catheter removed and a catheter free period.   3. Metabolic acidosis and hypokalemia: lactic acid not elevated.  - sodium bicarbonate infusion and potassium replacement  4. Anemia with chronic kidney disease: with iron deficiency and folate deficiency EPO with HD treatments  5. Secondary Hyperparathyroidism: with hypophosphatemia.   6. Malnutrition: albumin 1.3. 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   7. Thrombocytopenia: acute drop most likely due to infection. Platelet  count was 247K on 3/22    LOS: 2 Jakavion Bilodeau 4/5/20213:37 PM

## 2019-05-12 NOTE — Consult Note (Signed)
Pharmacy Antibiotic Note  Crystal Vargas is a 74 y.o. female admitted on 05/25/2019 with sepsis.  Pharmacy has been consulted for Vancomycin. Patient receives hemodialysis on Monday, Wednesday and Friday. Plan: Dialysis access removed. Vancomycin 750mg  IV x 1. Will follow up with plans on AM ICU rounds.   Height: 5\' 2"  (157.5 cm) Weight: 66.8 kg (147 lb 4.3 oz) IBW/kg (Calculated) : 50.1  Temp (24hrs), Avg:97.8 F (36.6 C), Min:97.3 F (36.3 C), Max:98.1 F (36.7 C)  Recent Labs  Lab 05/12/2019 1324 05/29/2019 2135 05/30/2019 2306 05/11/19 0611 05/12/19 0502 05/12/19 0607 05/12/19 1506  WBC 9.4  --   --  8.5  --  9.9  --   CREATININE 1.68* 2.04*  --  2.13* 2.47*  --   --   LATICACIDVEN 1.9  --  1.5 1.4  --   --   --   VANCORANDOM  --   --   --   --   --   --  17    Estimated Creatinine Clearance: 17.4 mL/min (A) (by C-G formula based on SCr of 2.47 mg/dL (H)).    Allergies  Allergen Reactions  . Abacavir Other (See Comments)  . Cephalosporins Other (See Comments)  . Ciprofloxacin Other (See Comments)  . Nsaids Other (See Comments)  . Penicillin G Other (See Comments)  . Quinolones Other (See Comments)  . Salicylates Other (See Comments)    Antimicrobials this admission: Vancomycin 4/3 >>  Azactam 4/3 x 1  Microbiology results: 4/3 BCx: MRSA 4/3 UCx: Kleb - Likely contamination  4/3 MRSA PCR: negative  Thank you for allowing pharmacy to be a part of this patient's care.  Haskel Dewalt L 05/12/2019 4:35 PM

## 2019-05-12 NOTE — Consult Note (Signed)
Consultation Note Date: 05/12/2019   Patient Name: Crystal Vargas  DOB: [birth year]  MRN: [id number]  Age / Sex: 74 y.o., female  PCP: Crystal Hua, MD Referring Physician: Flora Lipps, MD  Reason for Consultation: Establishing goals of care  HPI/Patient Profile: Patient is a 74 year old lifelong never smoker who has been followed by [facility] nephrology and initiated dialysis in January.  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  She has chronic issues with dysphagia due to Schatzki's ring and has been evaluated by GI and has had esophageal dilatation in the past.   Clinical Assessment and Goals of Care: Patient in bed with eyes closed. Daughter Crystal Vargas is at bedside. She states she is the HPOA, but includes other family members in discussions about her care.  She states prior to this hospitalization, she was "sharp as a tack". She enjoyed watching the news and talking about politics. She required help with ADL's 2/2 to her CVA in 2001. Crystal Vargas states in 2019, she had difficulty with swallowing and had no further issues with swallowing until last Wednesday. Since January, she has required dialysis.    We discussed her diagnoses, prognosis, GOC, EOL wishes disposition and options. Crystal Vargas called sister Crystal Vargas to add to conversation.  A detailed discussion was had today regarding advanced directives.  Concepts specific to code status, artifical feeding and hydration, IV antibiotics and rehospitalization were discussed.  The difference between an aggressive medical intervention path and a comfort care path was discussed.  Values and goals of care important to patient and family were attempted to be elicited.  Discussed limitations of medical interventions to prolong quality of life in some situations and discussed the concept of human  mortality.  Crystal Vargas and Crystal Vargas state their mother would want anything and everything done unless she had pain and suffering, but do not feel she is there yet. They request transfer to [facility]. Discussed with CCM. Returned to update daughter, and RN was at bedside. Patient said "ow" every time she was touched. Daughter stated she cannot understand what her mother is saying. Discussed ongoing conversations within the family about [place] and what pain and suffering would look like uniquely to Ms.Colborn if she were able to participate in the conversation.   She states she had a [place] conversation earlier today with the NP at the nursing home.       SUMMARY OF RECOMMENDATIONS   Palliative will shadow. Recommend palliative at [facility] if patient is transferred.    Prognosis:  Poor.   Discharge Planning: To Be Determined      Primary Diagnoses: Present on Admission: . Shock ([place]) . MRSA bacteremia . Essential hypertension   I have reviewed the medical record, interviewed the patient and family, and examined the patient. The following aspects are pertinent.  Past Medical History:  Diagnosis Date  . Chronic kidney disease    RENAL INSUFFICIENCY  . Coronary artery disease   . Depression   . Diabetes  mellitus without complication ([place])   . GERD (gastroesophageal reflux disease)   . Glaucoma   . Hyperlipidemia   . Hypertension   . Stroke [facility])    Social History   Socioeconomic History  . Marital status: Single    Spouse name: Not on file  . Number of children: Not on file  . Years of education: Not on file  . Highest education level: Not on file  Occupational History  . Not on file  Tobacco Use  . Smoking status: Never Smoker  . Smokeless tobacco: Never Used  Substance and Sexual Activity  . Alcohol use: Not Currently  . Drug use: Never  . Sexual activity: Not on file  Other Topics Concern  . Not on file  Social History Narrative  . Not on file   Social Determinants of Health    Financial Resource Strain:   . Difficulty of Paying Living Expenses:   Food Insecurity:   . Worried About Charity fundraiser in the Last Year:   . Arboriculturist in the Last Year:   Transportation Needs:   . Film/video editor (Medical):   Marland Kitchen Lack of Transportation (Non-Medical):   Physical Activity:   . Days of Exercise per Week:   . Minutes of Exercise per Session:   Stress:   . Feeling of Stress :   Social Connections:   . Frequency of Communication with Friends and Family:   . Frequency of Social Gatherings with Friends and Family:   . Attends Religious Services:   . Active Member of Clubs or Organizations:   . Attends Archivist Meetings:   Marland Kitchen Marital Status:    History reviewed. No pertinent family history. Scheduled Meds: . Chlorhexidine Gluconate Cloth  6 each Topical Daily  . insulin aspart  0-9 Units Subcutaneous Q4H  . vancomycin variable dose per unstable renal function (pharmacist dosing)   Does not apply See admin instructions   Continuous Infusions: . albumin human Stopped (05/12/19 1142)  . norepinephrine (LEVOPHED) Adult infusion 4 mcg/min (05/12/19 1227)  . vasopressin (PITRESSIN) infusion - *FOR SHOCK* 0.03 Units/min (05/12/19 1227)   PRN Meds:.morphine injection Medications Prior to Admission:  Prior to Admission medications   Medication Sig Start Date End Date Taking? Authorizing Provider  acetaminophen (TYLENOL) 325 MG tablet Take 650 mg by mouth 3 (three) times daily.    Yes [provider]  albuterol (PROVENTIL) (2.5 MG/3ML) 0.083% nebulizer solution Take 2.5 mg by nebulization 2 (two) times daily as needed for wheezing or shortness of breath.   Yes [provider]  allopurinol (ZYLOPRIM) 100 MG tablet Take 100 mg by mouth every evening.    Yes [provider]  amLODipine (NORVASC) 10 MG tablet Take 10 mg by mouth every evening.    Yes [provider]  atorvastatin (LIPITOR) 10 MG tablet Take 10 mg  by mouth every evening.    Yes [provider]  brimonidine-timolol (COMBIGAN) 0.2-0.5 % ophthalmic solution Place 1 drop into both eyes every 12 (twelve) hours.   Yes [provider]  carvedilol (COREG) 25 MG tablet Take 25 mg by mouth See admin instructions. Take 25 mg twice daily on Sunday, Tuesday, Thursday, Saturday   Yes [provider]  carvedilol (COREG) 25 MG tablet Take 25 mg by mouth See admin instructions. Take 25 mg in the evening on Monday, Wednesday, Friday   Yes [provider]  cholecalciferol (VITAMIN D3) 25 MCG (1000 UNIT) tablet Take 1,000  Units by mouth daily.   Yes [provider]  clopidogrel (PLAVIX) 75 MG tablet Take 75 mg by mouth every evening.    Yes [provider]  diclofenac Sodium (VOLTAREN) 1 % GEL Apply 2 g topically in the morning and at bedtime. To neck   Yes [provider]  guaiFENesin (ROBITUSSIN) 100 MG/5ML SOLN Take 5 mLs by mouth at bedtime as needed for cough or to loosen phlegm.   Yes [provider]  hydrALAZINE (APRESOLINE) 25 MG tablet Take 25 mg by mouth See admin instructions. Take 25 mg three times daily on Sun, Tue, Thu, Sat, give twice daily on Mon, Wed, Fri after dialysis   Yes [provider]  isosorbide dinitrate (ISORDIL) 10 MG tablet Take 10 mg by mouth 3 (three) times daily.   Yes [provider]  latanoprost (XALATAN) 0.005 % ophthalmic solution Place 1 drop into both eyes every evening.    Yes [provider]  levETIRAcetam (KEPPRA) 1000 MG tablet Take 1,000 mg by mouth every evening.   Yes [provider]  lisinopril (ZESTRIL) 2.5 MG tablet Take 2.5 mg by mouth every evening.    Yes [provider]  melatonin 3 MG TABS tablet Take 3 mg by mouth at bedtime.   Yes [provider]  midodrine (PROAMATINE) 10 MG tablet Take 10 mg by mouth every Monday, Wednesday, and Friday.   Yes [provider]  mirtazapine  (REMERON) 7.5 MG tablet Take 7.5 mg by mouth at bedtime.   Yes [provider]  pantoprazole (PROTONIX) 40 MG tablet Take 40 mg by mouth every evening.    Yes [provider]  polyethylene glycol (MIRALAX / GLYCOLAX) 17 g packet Take 17 g by mouth daily.   Yes [provider]  Propylene Glycol-Glycerin (ARTIFICIAL TEARS) 1-0.3 % SOLN Place 2 drops into both eyes in the morning and at bedtime.   Yes [provider]  sertraline (ZOLOFT) 50 MG tablet Take 50 mg by mouth daily.   Yes [provider]  Wheat Dextrin (BENEFIBER DRINK MIX PO) Take 1 Scoop by mouth daily.   Yes [provider]   Allergies  Allergen Reactions  . Abacavir Other (See Comments)  . Cephalosporins Other (See Comments)  . Ciprofloxacin Other (See Comments)  . Nsaids Other (See Comments)  . Penicillin G Other (See Comments)  . Quinolones Other (See Comments)  . Salicylates Other (See Comments)   Review of Systems  Unable to perform ROS   Physical Exam Pulmonary:     Effort: Pulmonary effort is normal.  Neurological:     Mental Status: She is alert.     Vital Signs: BP 103/64 (BP Location: Left Arm)   Pulse (!) 56   Temp 97.8 F (36.6 C) (Axillary)   Resp (!) 21   Ht 5\' 2"  (1.575 m)   Wt 66.8 kg   SpO2 97%   BMI 26.94 kg/m  Pain Scale: CPOT   Pain Score: Asleep   SpO2: SpO2: 97 % O2 Device:SpO2: 97 % O2 Flow Rate: .O2 Flow Rate (L/min): 1 L/min  IO: Intake/output summary:   Intake/Output Summary (Last 24 hours) at 05/12/2019 1407 Last data filed at 05/12/2019 1227 Gross per 24 hour  Intake 1173.27 ml  Output 0 ml  Net 1173.27 ml    LBM: Last BM Date: 05/11/19 Baseline Weight: Weight: 65.2 kg Most recent weight: Weight: 66.8 kg     Palliative Assessment/Data:     Time In:  1: 10 Time Out: 2:20 Time Total: 50 min Greater than 50%  of this time was spent counseling and coordinating care related to the above assessment and plan.  Signed  by: Asencion Gowda, NP   Please contact Palliative Medicine Team phone at [phone] for questions and concerns.  For individual provider: See Shea Evans

## 2019-05-12 NOTE — TOC Initial Note (Signed)
Transition of Care [facility]) - Initial/Assessment Note    Patient Details  Name: Crystal Vargas MRN: [id number] Date of Birth: [birth year]  [facility] CM/SW Contact:    Magnus Ivan, LCSW Phone Number: 05/12/2019, 3:55 PM  Clinical Narrative:      Per chart and rounds, patient is disoriented x 4. CSW called and spoke with patient's daughter, [name]. [name] reported patient is a resident at [facility]) SNF and has been there since 2012. She confirmed that they would like patient to return there at discharge.             CSW called Scientist, physiological. He confirmed patient can return there at discharge. Audry Pili reported patient will need to have a COVID test before returning to the SNF.  CSW will continue to follow for needs.   Expected Discharge Plan: Skilled Nursing Facility Barriers to Discharge: Continued Medical Work up   Patient Goals and CMS Choice        Expected Discharge Plan and Services Expected Discharge Plan: [place] arrangements for the past 2 months: [place]                                      Prior Living Arrangements/Services Living arrangements for the past 2 months: [place] Lives with:: Facility Resident Patient language and need for interpreter reviewed:: Yes        Need for Family Participation in Patient Care: Yes (Comment) Care giver support system in place?: Yes (comment)   Criminal Activity/Legal Involvement Pertinent to Current Situation/Hospitalization: No - Comment as needed  Activities of Daily Living      Permission Sought/Granted   Permission granted to share information with : Yes, Verbal Permission Granted(Permission given by family.)     Permission granted to share info w AGENCY: [facility]        Emotional Assessment         Alcohol / Substance Use: Not Applicable Psych Involvement: No (comment)  Admission diagnosis:   Hypokalemia [E87.6] Shock ([place]) [R57.9] ESRD (end stage renal disease) ([place]) [N18.6] Hypotension, unspecified hypotension type [I95.9] Anemia, unspecified type [D64.9] Patient Active Problem List   Diagnosis Date Noted  . MRSA bacteremia 05/11/2019  . Status post CVA 05/11/2019  . Schatzki's ring 05/11/2019  . Shock ([place]) 05/25/2019  . Diabetes ([place]) 04/15/2019  . Essential hypertension 04/15/2019  . ESRD on dialysis ([place]) 04/15/2019   PCP:  Marisa Hua, MD Pharmacy:  No Pharmacies Listed    Social Determinants of Health (SDOH) Interventions    Readmission Risk Interventions No flowsheet data found.

## 2019-05-12 NOTE — Consult Note (Signed)
NAME: Crystal Vargas  DOB: 10/20/1945  MRN: [id number]  Date/Time: 05/12/2019 12:17 PM  REQUESTING PROVIDER: Dr.Kurian Subjective:  REASON FOR CONSULT: MRSA bacteremia ?pt is a poor historian- chart reviewed and spoke to her daughter Crystal Vargas is a 74 y.o. 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 She has had dysphagia and has had esophageal dilatation for Schatzki's ring. As per daughter she has not been eating for the past week or so. Temp 98.1, HR 77, BP 63/41 Labs revealed K of 2.1,cr 1.68, Hb 6.9, PLT 82 Pt 's blood culture grew MRSA Her HD catheter was removed and the tip is also growing MRSA Pt is on vancomycin, admitted to ICU  As per daughter no hardware like cardiac device or joint implants Past Medical History:  Diagnosis Date  . Chronic kidney disease    RENAL INSUFFICIENCY  . Coronary artery disease   . Depression   . Diabetes mellitus without complication ([place])   . GERD (gastroesophageal reflux disease)   . Glaucoma   . Hyperlipidemia   . Hypertension   . Stroke [facility])   Dysphagia- esophageal dilatation   Past Surgical History:  Procedure Laterality Date  . ABDOMINAL HYSTERECTOMY    . ESOPHAGOGASTRODUODENOSCOPY (EGD) WITH PROPOFOL N/A 01/21/2018   Procedure: ESOPHAGOGASTRODUODENOSCOPY (EGD) WITH PROPOFOL;  Surgeon: Manya Silvas, MD;  Location: [facility] ENDOSCOPY;  Service: Endoscopy;  Laterality: N/A;    Social History   Socioeconomic History  . Marital status: Single    Spouse name: Not on file  . Number of children: Not on file  . Years of education: Not on file  . Highest education level: Not on file  Occupational History  . Not on file  Tobacco Use  . Smoking status: Never Smoker  . Smokeless tobacco: Never Used  Substance and Sexual Activity  . Alcohol use: Not Currently  . Drug  use: Never  . Sexual activity: Not on file  Other Topics Concern  . Not on file  Social History Narrative  . Not on file   Social Determinants of Health   Financial Resource Strain:   . Difficulty of Paying Living Expenses:   Food Insecurity:   . Worried About Charity fundraiser in the Last Year:   . Arboriculturist in the Last Year:   Transportation Needs:   . Film/video editor (Medical):   Marland Kitchen Lack of Transportation (Non-Medical):   Physical Activity:   . Days of Exercise per Week:   . Minutes of Exercise per Session:   Stress:   . Feeling of Stress :   Social Connections:   . Frequency of Communication with Friends and Family:   . Frequency of Social Gatherings with Friends and Family:   . Attends Religious Services:   . Active Member of Clubs or Organizations:   . Attends Archivist Meetings:   Marland Kitchen Marital Status:   Intimate Partner Violence:   . Fear of Current or Ex-Partner:   . Emotionally Abused:   Marland Kitchen Physically Abused:   . Sexually Abused:     History reviewed. No pertinent family history. Allergies  Allergen Reactions  . Abacavir Other (See Comments)  . Cephalosporins Other (See Comments)  . Ciprofloxacin Other (See Comments)  . Nsaids Other (See Comments)  . Penicillin G  Other (See Comments)  . Quinolones Other (See Comments)  . Salicylates Other (See Comments)    ? Current Facility-Administered Medications  Medication Dose Route Frequency Provider Last Rate Last Admin  . albumin human 25 % solution 25 g  25 g Intravenous BID Tyler Pita, MD   Stopped at 05/12/19 1142  . Chlorhexidine Gluconate Cloth 2 % PADS 6 each  6 each Topical Daily Tyler Pita, MD   6 each at 05/11/19 [phone]  . insulin aspart (novoLOG) injection 0-9 Units  0-9 Units Subcutaneous Q4H Darel Hong D, NP   2 Units at 05/12/19 1145  . morphine 2 MG/ML injection 1-2 mg  1-2 mg Intravenous Q4H PRN Darel Hong D, NP   1 mg at 05/12/19 0012  . norepinephrine  (LEVOPHED) 16 mg in 218mL premix infusion  0-40 mcg/min Intravenous Titrated Darel Hong D, NP 3.75 mL/hr at 05/12/19 1157 4 mcg/min at 05/12/19 1157  . vancomycin (VANCOREADY) IVPB 750 mg/150 mL  750 mg Intravenous Q M,W,F-HD Rito Ehrlich A, RPH      . vasopressin (PITRESSIN) 40 Units in sodium chloride 0.9 % 250 mL (0.16 Units/mL) infusion  0.03 Units/min Intravenous Continuous Darel Hong D, NP 11.25 mL/hr at 05/12/19 1157 0.03 Units/min at 05/12/19 1157     Abtx:  Anti-infectives (From admission, onward)   Start     Dose/Rate Route Frequency Ordered Stop   05/12/19 1200  vancomycin (VANCOREADY) IVPB 500 mg/100 mL  Status:  Discontinued     500 mg 100 mL/hr over 60 Minutes Intravenous Every M-W-F (Hemodialysis) 05/31/2019 2016 05/12/2019 2018   05/12/19 1200  vancomycin (VANCOREADY) IVPB 750 mg/150 mL     750 mg 150 mL/hr over 60 Minutes Intravenous Every M-W-F (Hemodialysis) 05/13/2019 2018     05/25/2019 2100  aztreonam (AZACTAM) 1 g in sodium chloride 0.9 % 100 mL IVPB  Status:  Discontinued     1 g 200 mL/hr over 30 Minutes Intravenous Every 24 hours 05/28/2019 2016 05/11/19 1019   06/04/2019 1500  aztreonam (AZACTAM) injection 2 g  Status:  Discontinued     2 g Intramuscular Once 05/26/2019 1411 05/18/2019 1442   05/25/2019 1445  aztreonam (AZACTAM) 2 g in sodium chloride 0.9 % 100 mL IVPB  Status:  Discontinued     2 g 200 mL/hr over 30 Minutes Intravenous  Once 05/08/2019 1442 05/21/2019 2016   06/02/2019 1430  vancomycin (VANCOREADY) IVPB 1250 mg/250 mL     1,250 mg 166.7 mL/hr over 90 Minutes Intravenous  Once 05/08/2019 1411 06/02/2019 1635      REVIEW OF SYSTEMS:  NA Objective:  VITALS:  BP 110/62   Pulse (!) 58   Temp (!) 97.3 F (36.3 C) (Axillary)   Resp (!) 25   Ht 5\' 2"  (1.575 m)   Wt 66.8 kg   SpO2 95%   BMI 26.94 kg/m  PHYSICAL EXAM:  General: Awake, responds to some questions but oriented only in person Head: Normocephalic, without obvious abnormality,  atraumatic. Eyes: Conjunctivae clear, anicteric sclerae. Pupils are equal ENT Nares normal. No drainage or sinus tenderness. Poor dentition Left facial palsy Neck: , symmetrical,  Lungs: b/l air entry Heart: s1s2 Abdomen: Soft, non-tender,not distended. Bowel sounds normal. No masses Extremities: feet in bunny boots Skin: No rashes or lesions. Or bruising Lymph: Cervical, supraclavicular normal. Neurologic: left hemiplegia Catheter from the chest wall has been removed  Pertinent Labs Lab Results CBC    Component Value Date/Time   WBC 9.9 05/12/2019  0607   RBC 2.87 (L) 05/12/2019 0607   HGB 9.5 (L) 05/12/2019 0607   HCT 27.6 (L) 05/12/2019 0607   PLT 80 (L) 05/12/2019 0607   MCV 96.2 05/12/2019 0607   MCH 33.1 05/12/2019 0607   MCHC 34.4 05/12/2019 0607   RDW 19.1 (H) 05/12/2019 0607   LYMPHSABS 0.3 (L) 05/25/2019 1324   MONOABS 0.1 05/30/2019 1324   EOSABS 0.0 05/26/2019 1324   BASOSABS 0.0 06/02/2019 1324    CMP Latest Ref Rng & Units 05/12/2019 05/11/2019 05/11/2019  Glucose 70 - 99 mg/dL 157(H) - 261(H)  BUN 8 - 23 mg/dL 30(H) - 28(H)  Creatinine 0.44 - 1.00 mg/dL 2.47(H) - 2.13(H)  Sodium 135 - 145 mmol/L 135 - 134(L)  Potassium 3.5 - 5.1 mmol/L 4.2 3.3(L) 3.6  Chloride 98 - 111 mmol/L 103 - 100  CO2 22 - 32 mmol/L 21(L) - 15(L)  Calcium 8.9 - 10.3 mg/dL 8.5(L) - 7.8(L)  Total Protein 6.5 - 8.1 g/dL - - -  Total Bilirubin 0.3 - 1.2 mg/dL - - -  Alkaline Phos 38 - 126 U/L - - -  AST 15 - 41 U/L - - -  ALT 0 - 44 U/L - - -      Microbiology: Recent Results (from the past 240 hour(s))  Blood culture (routine x 2)     Status: Abnormal (Preliminary result)   Collection Time: 06/02/2019 12:34 PM   Specimen: BLOOD  Result Value Ref Range Status   Specimen Description   Final    BLOOD R UP ARM Performed at [facility], [address], [place], [place] [address]    Special Requests   Final    BOTTLES DRAWN AEROBIC AND ANAEROBIC Blood Culture adequate  volume Performed at [facility], [address] [place], [place] [address]    Culture  Setup Time   Final    Organism ID to follow IN [place] CALLED TO, READ BACK BY AND VERIFIED WITH: [place] ON 05/11/19 AT 0210 [facility] Performed at [facility], [address] [place], [place] [address]    Culture STAPHYLOCOCCUS AUREUS (A)  Final   Report Status PENDING  Incomplete  Blood Culture ID Panel (Reflexed)     Status: Abnormal   Collection Time: 05/10/2019 12:34 PM  Result Value Ref Range Status   Enterococcus species NOT DETECTED NOT DETECTED Final   Listeria monocytogenes NOT DETECTED NOT DETECTED Final   Staphylococcus species DETECTED (A) NOT DETECTED Final    Comment: CRITICAL RESULT CALLED TO, READ BACK BY AND VERIFIED WITH: [place] ON 05/11/19 AT 0210 [facility]    Staphylococcus aureus (BCID) DETECTED (A) NOT DETECTED Final    Comment: Methicillin (oxacillin)-resistant Staphylococcus aureus (MRSA). MRSA is predictably resistant to beta-lactam antibiotics (except ceftaroline). Preferred therapy is vancomycin unless clinically contraindicated. Patient requires contact precautions if  hospitalized. CRITICAL RESULT CALLED TO, READ BACK BY AND VERIFIED WITH: SCOTT H[place] ON 05/11/19 AT 0210 [facility]    Methicillin resistance DETECTED (A) NOT DETECTED Final    Comment: CRITICAL RESULT CALLED TO, READ BACK BY AND VERIFIED WITH: [place]OTT H[place] ON 05/11/19 AT 0210 [place]    Streptococcus species NOT DETECTED NOT DETECTED Final   Streptococcus agalactiae NOT DETECTED NOT DETECTED Final   Streptococcus pneumoniae NOT DETECTED NOT DETECTED Final   Streptococcus pyogenes NOT DETECTED NOT DETECTED Final   Acinetobacter baumannii NOT DETECTED NOT DETECTED Final   Enterobacteriaceae species NOT  DETECTED NOT DETECTED Final   Enterobacter cloacae complex NOT DETECTED NOT DETECTED Final   Escherichia coli NOT DETECTED NOT DETECTED Final    Klebsiella oxytoca NOT DETECTED NOT DETECTED Final   Klebsiella pneumoniae NOT DETECTED NOT DETECTED Final   Proteus species NOT DETECTED NOT DETECTED Final   Serratia marcescens NOT DETECTED NOT DETECTED Final   Haemophilus influenzae NOT DETECTED NOT DETECTED Final   Neisseria meningitidis NOT DETECTED NOT DETECTED Final   Pseudomonas aeruginosa NOT DETECTED NOT DETECTED Final   Candida albicans NOT DETECTED NOT DETECTED Final   Candida glabrata NOT DETECTED NOT DETECTED Final   Candida krusei NOT DETECTED NOT DETECTED Final   Candida parapsilosis NOT DETECTED NOT DETECTED Final   Candida tropicalis NOT DETECTED NOT DETECTED Final    Comment: Performed at [facility], [address] [place], [place] [address]  Blood culture (routine x 2)     Status: Abnormal (Preliminary result)   Collection Time: 06/06/2019  1:24 PM   Specimen: BLOOD  Result Value Ref Range Status   Specimen Description   Final    BLOOD R LATREAL BICEP Performed at [facility], [address], [place], [place] [address]    Special Requests   Final    BOTTLES DRAWN AEROBIC AND ANAEROBIC Blood Culture adequate volume Performed at [facility], [address] [place], [place] [address]    Culture  Setup Time   Final    IN BOTH AEROBIC AND ANAEROBIC BOTTLES GRAM POSITIVE COCCI CRITICAL VALUE NOTED.  VALUE IS CONSISTENT WITH PREVIOUSLY REPORTED AND CALLED VALUE. Performed at [facility], [address], [place], [place] [address]    Culture (A)  Final    STAPHYLOCOCCUS AUREUS SUSCEPTIBILITIES TO FOLLOW Performed at [facility], [place] [address], [place], [place] [address]    Report Status PENDING  Incomplete  Respiratory Panel by RT PCR (Flu A&B, Covid) - Nasopharyngeal Swab     Status: None   Collection Time: 05/14/2019  1:24 PM   Specimen: Nasopharyngeal Swab  Result Value Ref Range Status   SARS Coronavirus 2 by RT PCR NEGATIVE NEGATIVE Final    Comment:  (NOTE) SARS-CoV-2 target nucleic acids are NOT DETECTED. The SARS-CoV-2 RNA is generally detectable in upper respiratoy specimens during the acute phase of infection. The lowest concentration of SARS-CoV-2 viral copies this assay can detect is 131 copies/mL. A negative result does not preclude SARS-Cov-2 infection and should not be used as the sole basis for treatment or other patient management decisions. A negative result may occur with  improper specimen collection/handling, submission of specimen other than nasopharyngeal swab, presence of viral mutation(s) within the areas targeted by this assay, and inadequate number of viral copies (<131 copies/mL). A negative result must be combined with clinical observations, patient history, and epidemiological information. The expected result is Negative. Fact Sheet for Patients:  PinkCheek.be Fact Sheet for Healthcare Providers:  GravelBags.it This test is not yet ap proved or cleared by the [place] FDA and  has been authorized for detection and/or diagnosis of SARS-CoV-2 by FDA under an Emergency Use Authorization (EUA). This EUA will remain  in effect (meaning this test can be used) for the duration of the COVID-19 declaration under Section 564(b)(1) of the Act, 21 U.S.C. section 360bbb-3(b)(1), unless the authorization is terminated or revoked sooner.    Influenza A by PCR NEGATIVE NEGATIVE Final   Influenza B by PCR NEGATIVE NEGATIVE Final    Comment: (NOTE) The  Xpert Xpress SARS-CoV-2/FLU/RSV assay is intended as an aid in  the diagnosis of influenza from Nasopharyngeal swab specimens and  should not be used as a sole basis for treatment. Nasal washings and  aspirates are unacceptable for Xpert Xpress SARS-CoV-2/FLU/RSV  testing. Fact Sheet for Patients: PinkCheek.be Fact Sheet for Healthcare  Providers: GravelBags.it This test is not yet approved or cleared by the [place] FDA and  has been authorized for detection and/or diagnosis of SARS-CoV-2 by  FDA under an Emergency Use Authorization (EUA). This EUA will remain  in effect (meaning this test can be used) for the duration of the  Covid-19 declaration under Section 564(b)(1) of the Act, 21  U.S.C. section 360bbb-3(b)(1), unless the authorization is  terminated or revoked. Performed at [facility], [address] [place], [place] [address]   MRSA PCR Screening     Status: None   Collection Time: 05/25/2019  6:27 PM   Specimen: Nasopharyngeal  Result Value Ref Range Status   MRSA by PCR NEGATIVE NEGATIVE Final    Comment:        The GeneXpert MRSA Assay (FDA approved for NASAL specimens only), is one component of a comprehensive MRSA colonization surveillance program. It is not intended to diagnose MRSA infection nor to guide or monitor treatment for MRSA infections. Performed at [facility], [address] [place], [place] [address]   Urine culture     Status: Abnormal (Preliminary result)   Collection Time: 05/25/2019 10:30 PM   Specimen: Urine, Clean Catch  Result Value Ref Range Status   Specimen Description   Final    URINE, CLEAN CATCH Performed at [facility], [address], [place], [place] [address]    Special Requests   Final    NONE Performed at [facility], [address] [place], [place] [address]    Culture >=100,000 COLONIES/mL GRAM NEGATIVE RODS (A)  Final   Report Status PENDING  Incomplete  Cath Tip Culture     Status: Abnormal (Preliminary result)   Collection Time: 05/11/19  1:10 PM   Specimen: Catheter Tip; Other  Result Value Ref Range Status   Specimen Description   Final    CATH TIP Performed at [facility], [address], [place], [place] [address]    Special Requests   Final     NONE Performed at [facility], [address] [place], [place] [address]    Culture (A)  Final    >=100,000 COLONIES/mL STAPHYLOCOCCUS AUREUS SUSCEPTIBILITIES TO FOLLOW Performed at [facility], [place] [address], [place], [place] [address]    Report Status PENDING  Incomplete  Culture, blood (routine x 2)     Status: None (Preliminary result)   Collection Time: 05/11/19  4:27 PM   Specimen: BLOOD  Result Value Ref Range Status   Specimen Description BLOOD A-LINE  Final   Special Requests   Final    BOTTLES DRAWN AEROBIC AND ANAEROBIC Blood Culture adequate volume   Culture   Final    NO GROWTH < 24 HOURS Performed at [facility], [address], [place], [place] [address]    Report Status PENDING  Incomplete    IMAGING RESULTS: Large remote right MCA distribution infarct with ex vacuo dilatation of the right lateral ventricle. Background chronic small vessel ischemia I have personally reviewed the films ? Impression/Recommendation ? ? MRSA bacteremia-with sepsis  source was the HD catheter which has been removed- she needs 2  d echo, repeat blood culture sent today Continue vancomycin  CKD on dialysis  hYpokalemia- being corrected  H/o dysphagia- has had esophageal dilatation before  Anemia- B12 okay, but low folate  Thrombocytopenia  Severe hypoalbuminemia? ___________________________________________________ Discussed with daughter

## 2019-05-12 NOTE — Progress Notes (Addendum)
CRITICAL CARE NOTE  CC  follow up septic shock  SUBJECTIVE Prognosis is guarded   BP 117/69   Pulse (!) 54   Temp (!) 97.3 F (36.3 C) (Axillary)   Resp 20   Ht 5\' 2"  (1.575 m)   Wt 66.8 kg   SpO2 97%   BMI 26.94 kg/m    I/O last 3 completed shifts: In: 3368.3 [I.V.:1918.3; Blood:290; IV Piggyback:1160.1] Out: 15 [Urine:15] Total I/O In: 40.9 [I.V.:40.9] Out: -   SpO2: 97 % O2 Flow Rate (L/min): 2 L/min FiO2 (%): (!) 2 %  Estimated body mass index is 26.94 kg/m as calculated from the following:   Height as of this encounter: 5\' 2"  (1.575 m).   Weight as of this encounter: 66.8 kg.  SIGNIFICANT EVENTS   REVIEW OF SYSTEMS  PATIENT IS UNABLE TO PROVIDE COMPLETE REVIEW OF SYSTEMS DUE TO SEVERE CRITICAL ILLNESS   Pressure Injury 05/31/2019 Heel Left;Medial Unstageable - Full thickness tissue loss in which the base of the injury is covered by slough (yellow, tan, gray, green or brown) and/or eschar (tan, brown or black) in the wound bed. unstagable with eshcar on le (Active)  06/04/2019 1830  Location: Heel  Location Orientation: Left;Medial  Staging: Unstageable - Full thickness tissue loss in which the base of the injury is covered by slough (yellow, tan, gray, green or brown) and/or eschar (tan, brown or black) in the wound bed.  Wound Description (Comments): unstagable with eshcar on left heel  Present on Admission: Yes     Pressure Injury 05/31/2019 Toe (Comment  which one) Left;Medial Deep Tissue Pressure Injury - Purple or maroon localized area of discolored intact skin or blood-filled blister due to damage of underlying soft tissue from pressure and/or shear. deep tissure (Active)  06/02/2019 1830  Location: Toe (Comment  which one) (left big toe)  Location Orientation: Left;Medial  Staging: Deep Tissue Pressure Injury - Purple or maroon localized area of discolored intact skin or blood-filled blister due to damage of underlying soft tissue from pressure and/or  shear.  Wound Description (Comments): deep tissure injury on left big toe  Present on Admission: Yes      PHYSICAL EXAMINATION:  GENERAL:critically ill appearing,  HEAD: Normocephalic, atraumatic.  EYES: Pupils equal, round, reactive to light.  No scleral icterus.  MOUTH: Moist mucosal membrane. NECK: Supple.  PULMONARY: +rhonchi, +wheezing CARDIOVASCULAR: S1 and S2. Regular rate and rhythm. No murmurs, rubs, or gallops.  GASTROINTESTINAL: Soft, nontender, -distended.  Positive bowel sounds.   MUSCULOSKELETAL:+edema.  NEUROLOGIC: lethargic SKIN:intact,warm,dry  MEDICATIONS: I have reviewed all medications and confirmed regimen as documented   CULTURE RESULTS   Recent Results (from the past 240 hour(s))  Blood culture (routine x 2)     Status: None (Preliminary result)   Collection Time: 06/05/2019 12:34 PM   Specimen: BLOOD  Result Value Ref Range Status   Specimen Description BLOOD R UP ARM  Final   Special Requests   Final    BOTTLES DRAWN AEROBIC AND ANAEROBIC Blood Culture adequate volume   Culture  Setup Time   Final    Organism ID to follow IN BOTH AEROBIC AND ANAEROBIC BOTTLES GRAM POSITIVE COCCI CRITICAL RESULT CALLED TO, READ BACK BY AND VERIFIED WITH: [place] ON 05/11/19 AT 0210 [facility] Performed at [facility], [address], [place], [place] [address]    Culture [facility] POSITIVE COCCI  Final   Report Status PENDING  Incomplete  Blood Culture ID Panel (Reflexed)  Status: Abnormal   Collection Time: 05/24/2019 12:34 PM  Result Value Ref Range Status   Enterococcus species NOT DETECTED NOT DETECTED Final   Listeria monocytogenes NOT DETECTED NOT DETECTED Final   Staphylococcus species DETECTED (A) NOT DETECTED Final    Comment: CRITICAL RESULT CALLED TO, READ BACK BY AND VERIFIED WITH: SCOTT HALL ON 05/11/19 AT 0210 [facility] [place]    Staphylococcus aureus (BCID) DETECTED (A) NOT DETECTED Final    Comment: Methicillin (oxacillin)-resistant Staphylococcus aureus  (MRSA). MRSA is predictably resistant to beta-lactam antibiotics (except ceftaroline). Preferred therapy is vancomycin unless clinically contraindicated. Patient requires contact precautions if  hospitalized. CRITICAL RESULT CALLED TO, READ BACK BY AND VERIFIED WITH: SCOTT HALL ON 05/11/19 AT 0210 [facility]    Methicillin resistance DETECTED (A) NOT DETECTED Final    Comment: CRITICAL RESULT CALLED TO, READ BACK BY AND VERIFIED WITH: SCOTT HALL ON 05/11/19 AT 0210 [place]    Streptococcus species NOT DETECTED NOT DETECTED Final   Streptococcus agalactiae NOT DETECTED NOT DETECTED Final   Streptococcus pneumoniae NOT DETECTED NOT DETECTED Final   Streptococcus pyogenes NOT DETECTED NOT DETECTED Final   Acinetobacter baumannii NOT DETECTED NOT DETECTED Final   Enterobacteriaceae species NOT DETECTED NOT DETECTED Final   Enterobacter cloacae complex NOT DETECTED NOT DETECTED Final   Escherichia coli NOT DETECTED NOT DETECTED Final   Klebsiella oxytoca NOT DETECTED NOT DETECTED Final   Klebsiella pneumoniae NOT DETECTED NOT DETECTED Final   Proteus species NOT DETECTED NOT DETECTED Final   Serratia marcescens NOT DETECTED NOT DETECTED Final   Haemophilus influenzae NOT DETECTED NOT DETECTED Final   Neisseria meningitidis NOT DETECTED NOT DETECTED Final   Pseudomonas aeruginosa NOT DETECTED NOT DETECTED Final   Candida albicans NOT DETECTED NOT DETECTED Final   Candida glabrata NOT DETECTED NOT DETECTED Final   Candida krusei NOT DETECTED NOT DETECTED Final   Candida parapsilosis NOT DETECTED NOT DETECTED Final   Candida tropicalis NOT DETECTED NOT DETECTED Final    Comment: Performed at [facility], [address] [place], [place] [address]  Blood culture (routine x 2)     Status: None (Preliminary result)   Collection Time: 05/13/2019  1:24 PM   Specimen: BLOOD  Result Value Ref Range Status   Specimen Description BLOOD R LATREAL BICEP  Final   Special Requests   Final    BOTTLES DRAWN  AEROBIC AND ANAEROBIC Blood Culture adequate volume   Culture  Setup Time   Final    IN BOTH AEROBIC AND ANAEROBIC BOTTLES GRAM POSITIVE COCCI CRITICAL VALUE NOTED.  VALUE IS CONSISTENT WITH PREVIOUSLY REPORTED AND CALLED VALUE. Performed at [facility], [address] [place], [place] [address]    Culture [facility] POSITIVE COCCI  Final   Report Status PENDING  Incomplete  Respiratory Panel by RT PCR (Flu A&B, Covid) - Nasopharyngeal Swab     Status: None   Collection Time: 05/30/2019  1:24 PM   Specimen: Nasopharyngeal Swab  Result Value Ref Range Status   SARS Coronavirus 2 by RT PCR NEGATIVE NEGATIVE Final    Comment: (NOTE) SARS-CoV-2 target nucleic acids are NOT DETECTED. The SARS-CoV-2 RNA is generally detectable in upper respiratoy specimens during the acute phase of infection. The lowest concentration of SARS-CoV-2 viral copies this assay can detect is 131 copies/mL. A negative result does not preclude SARS-Cov-2 infection and should not be used as the sole basis for treatment or other patient management decisions. A negative result may occur with  improper specimen  collection/handling, submission of specimen other than nasopharyngeal swab, presence of viral mutation(s) within the areas targeted by this assay, and inadequate number of viral copies (<131 copies/mL). A negative result must be combined with clinical observations, patient history, and epidemiological information. The expected result is Negative. Fact Sheet for Patients:  PinkCheek.be Fact Sheet for Healthcare Providers:  GravelBags.it This test is not yet ap proved or cleared by the [place] FDA and  has been authorized for detection and/or diagnosis of SARS-CoV-2 by FDA under an Emergency Use Authorization (EUA). This EUA will remain  in effect (meaning this test can be used) for the duration of the COVID-19 declaration under Section  564(b)(1) of the Act, 21 U.S.C. section 360bbb-3(b)(1), unless the authorization is terminated or revoked sooner.    Influenza A by PCR NEGATIVE NEGATIVE Final   Influenza B by PCR NEGATIVE NEGATIVE Final    Comment: (NOTE) The Xpert Xpress SARS-CoV-2/FLU/RSV assay is intended as an aid in  the diagnosis of influenza from Nasopharyngeal swab specimens and  should not be used as a sole basis for treatment. Nasal washings and  aspirates are unacceptable for Xpert Xpress SARS-CoV-2/FLU/RSV  testing. Fact Sheet for Patients: PinkCheek.be Fact Sheet for Healthcare Providers: GravelBags.it This test is not yet approved or cleared by the [place] FDA and  has been authorized for detection and/or diagnosis of SARS-CoV-2 by  FDA under an Emergency Use Authorization (EUA). This EUA will remain  in effect (meaning this test can be used) for the duration of the  Covid-19 declaration under Section 564(b)(1) of the Act, 21  U.S.C. section 360bbb-3(b)(1), unless the authorization is  terminated or revoked. Performed at [facility], [address] [place], [place] [address]   MRSA PCR Screening     Status: None   Collection Time: 06/04/2019  6:27 PM   Specimen: Nasopharyngeal  Result Value Ref Range Status   MRSA by PCR NEGATIVE NEGATIVE Final    Comment:        The GeneXpert MRSA Assay (FDA approved for NASAL specimens only), is one component of a comprehensive MRSA colonization surveillance program. It is not intended to diagnose MRSA infection nor to guide or monitor treatment for MRSA infections. Performed at [facility], [address], [place], [place] [address]   Urine culture     Status: Abnormal (Preliminary result)   Collection Time: 05/16/2019 10:30 PM   Specimen: Urine, Clean Catch  Result Value Ref Range Status   Specimen Description   Final    URINE, CLEAN CATCH Performed at [facility], [address] [place], [place] [address]    Special Requests   Final    NONE Performed at [facility], [name], [place], [place] [address]    Culture >=100,000 COLONIES/mL GRAM NEGATIVE RODS (A)  Final   Report Status PENDING  Incomplete  Culture, blood (routine x 2)     Status: None (Preliminary result)   Collection Time: 05/11/19  4:27 PM   Specimen: BLOOD  Result Value Ref Range Status   Specimen Description BLOOD A-LINE  Final   Special Requests   Final    BOTTLES DRAWN AEROBIC AND ANAEROBIC Blood Culture adequate volume   Culture   Final    NO GROWTH < 24 HOURS Performed at [facility], [address] [place], [place] [address]    Report Status PENDING  Incomplete          IMAGING    DG  Chest Port 1 View  Result Date: 05/11/2019 CLINICAL DATA:  Respiratory failure EXAM: PORTABLE CHEST 1 VIEW COMPARISON:  May 10, 2019 FINDINGS: Central catheter tip in right atrium slightly beyond cavoatrial junction. No pneumothorax. There is atelectatic change in the medial right base. Lungs elsewhere clear. There is stable cardiac prominence with pulmonary vascularity normal. There is aortic atherosclerosis. No adenopathy. No bone lesions. IMPRESSION: No change in central catheter placement. No pneumothorax. Medial right base atelectasis. Lungs elsewhere clear. Stable cardiac prominence. Aortic Atherosclerosis (ICD10-I70.0). Electronically Signed   By: Lowella Grip III M.D.   On: 05/11/2019 08:36   ECHOCARDIOGRAM COMPLETE  Result Date: 05/12/2019    ECHOCARDIOGRAM REPORT   Patient Name:   MAURISSA Jepsen Date of Exam: 05/11/2019 Medical Rec #:  [id number]  Height:       62.0 in Accession #:    [id number] Weight:       136.2 lb Date of Birth:  1945/09/30  BSA:          1.624 m Patient Age:    30 years   BP:           100/63 mmHg Patient Gender: F          HR:           64 bpm. Exam Location:  [facility] Procedure: 2D Echo Indications:     Bacteremia 790.7/  R78.81  History:         Patient has no prior history of Echocardiogram examinations.  Sonographer:     [place] Referring Phys:  [id number] Bradly Bienenstock Diagnosing Phys: Bartholome Bill MD IMPRESSIONS  1. Left ventricular ejection fraction, by estimation, is 45 to 50%. The left ventricle has mildly decreased function. The left ventricle has no regional wall motion abnormalities. There is moderate concentric left ventricular hypertrophy. Left ventricular diastolic parameters are consistent with Grade I diastolic dysfunction (impaired relaxation).  2. Right ventricular systolic function is normal. The right ventricular size is mildly enlarged. There is moderately elevated pulmonary artery systolic pressure.  3. Left atrial size was mildly dilated.  4. Right atrial size was mildly dilated.  5. The mitral valve was not well visualized. Mild to moderate mitral valve regurgitation.  6. Tricuspid valve regurgitation is moderate to severe.  7. The aortic valve is grossly normal. Aortic valve regurgitation is trivial. No aortic stenosis is present. FINDINGS  Left Ventricle: Left ventricular ejection fraction, by estimation, is 45 to 50%. The left ventricle has mildly decreased function. The left ventricle has no regional wall motion abnormalities. The left ventricular internal cavity size was normal in size. There is moderate concentric left ventricular hypertrophy. Left ventricular diastolic parameters are consistent with Grade I diastolic dysfunction (impaired relaxation). Right Ventricle: The right ventricular size is mildly enlarged. No increase in right ventricular wall thickness. Right ventricular systolic function is normal. There is moderately elevated pulmonary artery systolic pressure. Left Atrium: Left atrial size was mildly dilated. Right Atrium: Right atrial size was mildly dilated. Pericardium: There is no evidence of pericardial effusion. Mitral Valve: The mitral valve was not well visualized. Mild  to moderate mitral valve regurgitation. Tricuspid Valve: The tricuspid valve is not well visualized. Tricuspid valve regurgitation is moderate to severe. Aortic Valve: The aortic valve is grossly normal. Aortic valve regurgitation is trivial. No aortic stenosis is present. Aortic valve peak gradient measures 3.5 mmHg. Pulmonic Valve: The pulmonic valve was not well visualized. Pulmonic valve regurgitation is trivial. Aorta: The aortic root was not well  visualized. IAS/Shunts: The interatrial septum was not assessed.  LEFT VENTRICLE PLAX 2D LVIDd:         3.67 cm     Diastology LVIDs:         2.86 cm     LV e' lateral:   6.74 cm/s LV PW:         1.27 cm     LV E/e' lateral: 14.4 LV IVS:        1.31 cm     LV e' medial:    5.44 cm/s LVOT diam:     1.90 cm     LV E/e' medial:  17.8 LV SV:         37 LV SV Index:   23 LVOT Area:     2.84 cm  LV Volumes (MOD) LV vol d, MOD A2C: 63.1 ml LV vol d, MOD A4C: 49.4 ml LV vol s, MOD A2C: 28.0 ml LV vol s, MOD A4C: 26.7 ml LV SV MOD A2C:     35.1 ml LV SV MOD A4C:     49.4 ml LV SV MOD BP:      28.7 ml RIGHT VENTRICLE RV Basal diam:  2.77 cm RV S prime:     7.72 cm/s TAPSE (M-mode): 1.2 cm LEFT ATRIUM             Index       RIGHT ATRIUM           Index LA diam:        3.60 cm 2.22 cm/m  RA Area:     15.60 cm LA Vol (A2C):   49.1 ml 30.24 ml/m RA Volume:   38.80 ml  23.89 ml/m LA Vol (A4C):   57.1 ml 35.16 ml/m LA Biplane Vol: 58.2 ml 35.84 ml/m  AORTIC VALVE                PULMONIC VALVE AV Area (Vmax): 1.93 cm    PV Vmax:       0.62 m/s AV Vmax:        93.50 cm/s  PV Peak grad:  1.6 mmHg AV Peak Grad:   3.5 mmHg LVOT Vmax:      63.50 cm/s LVOT Vmean:     38.700 cm/s LVOT VTI:       0.132 m  AORTA Ao Root diam: 2.50 cm MITRAL VALVE               TRICUSPID VALVE MV Area (PHT): 2.32 cm    TV Peak grad:   36.8 mmHg MV Decel Time: 327 msec    TV Vmax:        3.03 m/s MV E velocity: 97.00 cm/s MV A velocity: 44.60 cm/s  SHUNTS MV E/A ratio:  2.17        Systemic VTI:  0.13  m                            Systemic Diam: 1.90 cm Bartholome Bill MD Electronically signed by Bartholome Bill MD Signature Date/Time: 05/12/2019/7:18:18 AM    Final      Nutrition Status:           Central Line/ continued, requirement due to  Reason to continue [employer] of central venous pressure or other hemodynamic parameters and poor IV access      ASSESSMENT AND PLAN SYNOPSIS  SEPTIC Shock MRSA bacteremia Low oncotic pressure due to hypoalbuminemia Wean  pressors as tolerated   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 intake due to chronic issues with dysphagia Query sideropenic dysphagia Recommend Palliative care consultation  Severe deconditioning Dense left hemiparesis  Prophylaxis No anticoagulants due to thrombocytopenia SCDs GI prophylaxis as needed  CARDIAC ICU monitoring  ENDO - will use ICU hypoglycemic\Hyperglycemia protocol if indicated   ELECTROLYTES -follow labs as needed -replace as needed -pharmacy consultation and following     Critical Care Time devoted to patient care services described in this note is 32 minutes.   Overall, patient is critically ill, prognosis is guarded.   high risk for cardiac arrest and death.    Corrin Parker, M.D.  [facility]  Medical Director [place] Director [facility] Cardio-Pulmonary Department

## 2019-05-12 NOTE — Progress Notes (Signed)
CSW acknowledges consult for primary contact/Advance Directives information. Per chart review, patient is disoriented x 4. Patient has children listed in contacts. Please defer to her children for decisions while patient is unable to make decisions.  Oleh Genin, [place]

## 2019-05-13 DIAGNOSIS — R7881 Bacteremia: Secondary | ICD-10-CM | POA: Diagnosis not present

## 2019-05-13 DIAGNOSIS — Z515 Encounter for palliative care: Secondary | ICD-10-CM | POA: Diagnosis not present

## 2019-05-13 DIAGNOSIS — B9562 Methicillin resistant Staphylococcus aureus infection as the cause of diseases classified elsewhere: Secondary | ICD-10-CM | POA: Diagnosis not present

## 2019-05-13 LAB — TYPE AND SCREEN
ABO/RH(D): O POS
Antibody Screen: POSITIVE
Donor AG Type: NEGATIVE
Unit division: 0
Unit division: 0
Unit division: 0

## 2019-05-13 LAB — BPAM RBC
Blood Product Expiration Date: 202104282359
Blood Product Expiration Date: 202104282359
Blood Product Expiration Date: 202105012359
ISSUE DATE / TIME: 202104041252
Unit Type and Rh: 5100
Unit Type and Rh: 5100
Unit Type and Rh: 5100

## 2019-05-13 LAB — CULTURE, BLOOD (ROUTINE X 2)
Special Requests: ADEQUATE
Special Requests: ADEQUATE

## 2019-05-13 LAB — CBC
HCT: 23.6 % — ABNORMAL LOW (ref 36.0–46.0)
Hemoglobin: 7.8 g/dL — ABNORMAL LOW (ref 12.0–15.0)
MCH: 31.8 pg (ref 26.0–34.0)
MCHC: 33.1 g/dL (ref 30.0–36.0)
MCV: 96.3 fL (ref 80.0–100.0)
Platelets: 38 10*3/uL — ABNORMAL LOW (ref 150–400)
RBC: 2.45 MIL/uL — ABNORMAL LOW (ref 3.87–5.11)
RDW: 18.6 % — ABNORMAL HIGH (ref 11.5–15.5)
WBC: 5.1 10*3/uL (ref 4.0–10.5)
nRBC: 0.6 % — ABNORMAL HIGH (ref 0.0–0.2)

## 2019-05-13 LAB — CATH TIP CULTURE: Culture: >=100000 — AB

## 2019-05-13 LAB — BASIC METABOLIC PANEL
Anion gap: 17 — ABNORMAL HIGH (ref 5–15)
BUN: 38 mg/dL — ABNORMAL HIGH (ref 8–23)
CO2: 16 mmol/L — ABNORMAL LOW (ref 22–32)
Calcium: 9.1 mg/dL (ref 8.9–10.3)
Chloride: 105 mmol/L (ref 98–111)
Creatinine, Ser: 3.01 mg/dL — ABNORMAL HIGH (ref 0.44–1.00)
GFR calc Af Amer: 17 mL/min — ABNORMAL LOW (ref >60)
GFR calc non Af Amer: 14 mL/min — ABNORMAL LOW (ref >60)
Glucose, Bld: 140 mg/dL — ABNORMAL HIGH (ref 70–99)
Potassium: 4.1 mmol/L (ref 3.5–5.1)
Sodium: 138 mmol/L (ref 135–145)

## 2019-05-13 LAB — GLUCOSE, CAPILLARY
Glucose-Capillary: 132 mg/dL — ABNORMAL HIGH (ref 70–99)
Glucose-Capillary: 136 mg/dL — ABNORMAL HIGH (ref 70–99)
Glucose-Capillary: 151 mg/dL — ABNORMAL HIGH (ref 70–99)
Glucose-Capillary: 124 mg/dL — ABNORMAL HIGH (ref 70–99)
Glucose-Capillary: 137 mg/dL — ABNORMAL HIGH (ref 70–99)

## 2019-05-13 LAB — PREPARE RBC (CROSSMATCH)

## 2019-05-13 LAB — VANCOMYCIN, TROUGH: Vancomycin Tr: 32 ug/mL (ref 15–20)

## 2019-05-13 LAB — T3, FREE: T3, Free: 1.1 pg/mL — ABNORMAL LOW (ref 2.0–4.4)

## 2019-05-13 LAB — PARATHYROID HORMONE, INTACT (NO CA): PTH: 232 pg/mL — ABNORMAL HIGH (ref 15–65)

## 2019-05-13 NOTE — Progress Notes (Signed)
[facility]  ROUNDING NOTE   Subjective:   Daughter at bedside last night. Discussed goals of care with daughter.  Weaned off vasopressin. Weaning norepinephrine.    Objective:  Vital signs in last 24 hours:  Temp:  [97.8 F (36.6 C)-98.7 F (37.1 C)] 98.6 F (37 C) (04/06 1200) Pulse Rate:  [47-69] 65 (04/06 1200) Resp:  [12-31] 24 (04/06 1200) BP: (92-116)/(60-88) 106/61 (04/06 1200) SpO2:  [96 %-100 %] 99 % (04/06 1200) Weight:  [69.5 kg] 69.5 kg (04/06 0500)  Weight change: 2.7 kg Filed Weights   05/14/2019 1820 05/12/19 0500 05/13/19 0500  Weight: 61.8 kg 66.8 kg 69.5 kg    Intake/Output: I/O last 3 completed shifts: In: 971.6 [I.V.:570; IV Piggyback:401.7] Out: 0    Intake/Output this shift:  Total I/O In: 72.5 [I.V.:32.3; IV Piggyback:40.2] Out: 0   Physical Exam: General: Critical ill  Head: Normocephalic, atraumatic. Dry oral mucosal membranes  Eyes: Anicteric, PERRL  Neck: Supple, trachea midline  Lungs:  Clear to auscultation  Heart: Regular rate and rhythm  Abdomen:  Soft, nontender   Extremities:  no peripheral edema.  Neurologic: Left sided weakness. Moving her extremities  Skin: No lesions  Access: none    Basic Metabolic Panel: Recent Labs  Lab 06/01/2019 1324 05/13/2019 1324 05/08/2019 1625 06/04/2019 2135 06/06/2019 2135 05/11/19 0611 05/11/19 1627 05/12/19 0502 05/13/19 0556  NA 139  --   --  136  --  134*  --  135 138  K 2.1*   < >  --  3.5  --  3.6 3.3* 4.2 4.1  CL 107  --   --  103  --  100  --  103 105  CO2 20*  --   --  15*  --  15*  --  21* 16*  GLUCOSE 137*  --   --  202*  --  261*  --  157* 140*  BUN 20  --   --  25*  --  28*  --  30* 38*  CREATININE 1.68*  --   --  2.04*  --  2.13*  --  2.47* 3.01*  CALCIUM 6.7*   < >  --  7.9*   < > 7.8*  --  8.5* 9.1  MG  --   --  1.6*  --   --  2.3  --  2.0  --   PHOS  --   --   --  1.6*  --  4.6  --  3.6  --    < > = values in this interval not displayed.    Liver Function  Tests: Recent Labs  Lab 06/01/2019 1324  AST 26  ALT 14  ALKPHOS 55  BILITOT 1.5*  PROT 3.9*  ALBUMIN 1.3*   Recent Labs  Lab 05/13/2019 1324  LIPASE <10*   Recent Labs  Lab 05/25/2019 1234  AMMONIA 13    CBC: Recent Labs  Lab 05/23/2019 1324 05/11/19 0611 05/11/19 1627 05/12/19 0607 05/13/19 0556  WBC 9.4 8.5  --  9.9 5.1  NEUTROABS 7.5  --   --   --   --   HGB 6.9* 7.0* 8.0* 9.5* 7.8*  HCT 21.4* 22.2* 23.9* 27.6* 23.6*  MCV 103.4* 106.2*  --  96.2 96.3  PLT 82* 79*  --  80* 38*    Cardiac Enzymes: Recent Labs  Lab 05/19/2019 1324  CKTOTAL 17*    BNP: Invalid input(s): POCBNP  CBG: Recent Labs  Lab  05/12/19 1935 05/12/19 2322 05/13/19 0407 05/13/19 0725 05/13/19 1129  GLUCAP 116* 119* 132* 136* 151*    Microbiology: Results for orders placed or performed during the hospital encounter of 06/05/2019  Blood culture (routine x 2)     Status: Abnormal   Collection Time: 05/17/2019 12:34 PM   Specimen: BLOOD  Result Value Ref Range Status   Specimen Description   Final    BLOOD R UP ARM Performed at [facility], [address], [place], [place] [address]    Special Requests   Final    BOTTLES DRAWN AEROBIC AND ANAEROBIC Blood Culture adequate volume Performed at [facility] [address] [place], [place] [address]    Culture  Setup Time   Final    Organism ID to follow IN [place] TO, READ BACK BY AND VERIFIED WITH: [place] ON 05/11/19 AT 0210 [facility] Performed at [facility], [address] [place], [place] [address]    Culture (A)  Final    STAPHYLOCOCCUS AUREUS SUSCEPTIBILITIES PERFORMED ON PREVIOUS CULTURE WITHIN THE LAST 5 DAYS. Performed at [facility], [place] [address], [place], [place] [address]    Report Status 05/13/2019 FINAL  Final  Blood Culture ID Panel (Reflexed)     Status: Abnormal   Collection Time: 05/31/2019 12:34 PM  Result  Value Ref Range Status   Enterococcus species NOT DETECTED NOT DETECTED Final   Listeria monocytogenes NOT DETECTED NOT DETECTED Final   Staphylococcus species DETECTED (A) NOT DETECTED Final    Comment: CRITICAL RESULT CALLED TO, READ BACK BY AND VERIFIED WITH: SCOTT HALL ON 05/11/19 AT 0210 [facility]    Staphylococcus aureus (BCID) DETECTED (A) NOT DETECTED Final    Comment: Methicillin (oxacillin)-resistant Staphylococcus aureus (MRSA). MRSA is predictably resistant to beta-lactam antibiotics (except ceftaroline). Preferred therapy is vancomycin unless clinically contraindicated. Patient requires contact precautions if  hospitalized. CRITICAL RESULT CALLED TO, READ BACK BY AND VERIFIED WITH: SCOTT HALL ON 05/11/19 AT 0210 [facility]    Methicillin resistance DETECTED (A) NOT DETECTED Final    Comment: CRITICAL RESULT CALLED TO, READ BACK BY AND VERIFIED WITH: SCOTT HALL ON 05/11/19 AT 0210 [place]    Streptococcus species NOT DETECTED NOT DETECTED Final   Streptococcus agalactiae NOT DETECTED NOT DETECTED Final   Streptococcus pneumoniae NOT DETECTED NOT DETECTED Final   Streptococcus pyogenes NOT DETECTED NOT DETECTED Final   Acinetobacter baumannii NOT DETECTED NOT DETECTED Final   Enterobacteriaceae species NOT DETECTED NOT DETECTED Final   Enterobacter cloacae complex NOT DETECTED NOT DETECTED Final   Escherichia coli NOT DETECTED NOT DETECTED Final   Klebsiella oxytoca NOT DETECTED NOT DETECTED Final   Klebsiella pneumoniae NOT DETECTED NOT DETECTED Final   Proteus species NOT DETECTED NOT DETECTED Final   Serratia marcescens NOT DETECTED NOT DETECTED Final   Haemophilus influenzae NOT DETECTED NOT DETECTED Final   Neisseria meningitidis NOT DETECTED NOT DETECTED Final   Pseudomonas aeruginosa NOT DETECTED NOT DETECTED Final   Candida albicans NOT DETECTED NOT DETECTED Final   Candida glabrata NOT DETECTED NOT DETECTED Final   Candida krusei NOT DETECTED NOT DETECTED Final   Candida parapsilosis  NOT DETECTED NOT DETECTED Final   Candida tropicalis NOT DETECTED NOT DETECTED Final    Comment: Performed at [facility], [address] [place], [place] [address]  Blood culture (routine x 2)     Status: Abnormal   Collection Time: 05/18/2019  1:24 PM   Specimen: BLOOD  Result Value Ref Range Status   Specimen Description   Final    BLOOD R LATREAL BICEP Performed at [facility], [address], [place], [place] [address]    Special Requests   Final    BOTTLES DRAWN AEROBIC AND ANAEROBIC Blood Culture adequate volume Performed at [facility], [address] [place], [place] [address]    Culture  Setup Time   Final    IN BOTH AEROBIC AND ANAEROBIC BOTTLES GRAM POSITIVE COCCI CRITICAL VALUE NOTED.  VALUE IS CONSISTENT WITH PREVIOUSLY REPORTED AND CALLED VALUE. Performed at [facility], [address] [place], [place] [address]    Culture METHICILLIN RESISTANT STAPHYLOCOCCUS AUREUS (A)  Final   Report Status 05/13/2019 FINAL  Final   Organism ID, Bacteria METHICILLIN RESISTANT STAPHYLOCOCCUS AUREUS  Final      Susceptibility   Methicillin resistant staphylococcus aureus - MIC*    CIPROFLOXACIN >=8 RESISTANT Resistant     ERYTHROMYCIN >=8 RESISTANT Resistant     GENTAMICIN <=0.5 SENSITIVE Sensitive     OXACILLIN >=4 RESISTANT Resistant     TETRACYCLINE <=1 SENSITIVE Sensitive     VANCOMYCIN <=0.5 SENSITIVE Sensitive     TRIMETH/SULFA >=320 RESISTANT Resistant     CLINDAMYCIN <=0.25 SENSITIVE Sensitive     RIFAMPIN <=0.5 SENSITIVE Sensitive     Inducible Clindamycin NEGATIVE Sensitive     * METHICILLIN RESISTANT STAPHYLOCOCCUS AUREUS  Respiratory Panel by RT PCR (Flu A&B, Covid) - Nasopharyngeal Swab     Status: None   Collection Time: 05/09/2019  1:24 PM   Specimen: Nasopharyngeal Swab  Result Value Ref Range Status   SARS Coronavirus 2 by RT PCR NEGATIVE NEGATIVE Final    Comment: (NOTE) SARS-CoV-2 target nucleic acids are NOT  DETECTED. The SARS-CoV-2 RNA is generally detectable in upper respiratoy specimens during the acute phase of infection. The lowest concentration of SARS-CoV-2 viral copies this assay can detect is 131 copies/mL. A negative result does not preclude SARS-Cov-2 infection and should not be used as the sole basis for treatment or other patient management decisions. A negative result may occur with  improper specimen collection/handling, submission of specimen other than nasopharyngeal swab, presence of viral mutation(s) within the areas targeted by this assay, and inadequate number of viral copies (<131 copies/mL). A negative result must be combined with clinical observations, patient history, and epidemiological information. The expected result is Negative. Fact Sheet for Patients:  PinkCheek.be Fact Sheet for Healthcare Providers:  GravelBags.it This test is not yet ap proved or cleared by the [place] FDA and  has been authorized for detection and/or diagnosis of SARS-CoV-2 by FDA under an Emergency Use Authorization (EUA). This EUA will remain  in effect (meaning this test can be used) for the duration of the COVID-19 declaration under Section 564(b)(1) of the Act, 21 U.S.C. section 360bbb-3(b)(1), unless the authorization is terminated or revoked sooner.    Influenza A by PCR NEGATIVE NEGATIVE Final   Influenza B by PCR NEGATIVE NEGATIVE Final    Comment: (NOTE) The Xpert Xpress SARS-CoV-2/FLU/RSV assay is intended as an aid in  the diagnosis of influenza from Nasopharyngeal swab specimens and  should not be used as a sole basis for treatment. Nasal washings and  aspirates are unacceptable for Xpert Xpress SARS-CoV-2/FLU/RSV  testing. Fact Sheet for Patients: PinkCheek.be Fact Sheet for Healthcare Providers: GravelBags.it This test is not yet approved or cleared  by the [place] FDA and  has  been authorized for detection and/or diagnosis of SARS-CoV-2 by  FDA under an Emergency Use Authorization (EUA). This EUA will remain  in effect (meaning this test can be used) for the duration of the  Covid-19 declaration under Section 564(b)(1) of the Act, 21  U.S.C. section 360bbb-3(b)(1), unless the authorization is  terminated or revoked. Performed at [facility], [address] [place], [place] [address]   MRSA PCR Screening     Status: None   Collection Time: 06/02/2019  6:27 PM   Specimen: Nasopharyngeal  Result Value Ref Range Status   MRSA by PCR NEGATIVE NEGATIVE Final    Comment:        The GeneXpert MRSA Assay (FDA approved for NASAL specimens only), is one component of a comprehensive MRSA colonization surveillance program. It is not intended to diagnose MRSA infection nor to guide or monitor treatment for MRSA infections. Performed at [facility], [address] [place], [place] [address]   Urine culture     Status: Abnormal (Preliminary result)   Collection Time: 06/01/2019 10:30 PM   Specimen: Urine, Clean Catch  Result Value Ref Range Status   Specimen Description   Final    URINE, CLEAN CATCH Performed at [facility], [address], [place], [place] [address]    Special Requests   Final    NONE Performed at [facility], [address] [place], [place] [address]    Culture (A)  Final    >=100,000 COLONIES/mL KLEBSIELLA PNEUMONIAE >=100,000 COLONIES/mL ENTEROCOCCUS FAECIUM CULTURE REINCUBATED FOR BETTER GROWTH Performed at [facility], [place] [address], [place], [place] [address]    Report Status PENDING  Incomplete   Organism ID, Bacteria KLEBSIELLA PNEUMONIAE (A)  Final      Susceptibility   Klebsiella pneumoniae - MIC*    AMPICILLIN >=32 RESISTANT Resistant     CEFAZOLIN <=4 SENSITIVE Sensitive     CEFTRIAXONE <=0.25 SENSITIVE Sensitive     CIPROFLOXACIN <=0.25  SENSITIVE Sensitive     GENTAMICIN <=1 SENSITIVE Sensitive     IMIPENEM 0.5 SENSITIVE Sensitive     NITROFURANTOIN 64 INTERMEDIATE Intermediate     TRIMETH/SULFA <=20 SENSITIVE Sensitive     AMPICILLIN/SULBACTAM 4 SENSITIVE Sensitive     PIP/TAZO <=4 SENSITIVE Sensitive     * >=100,000 COLONIES/mL KLEBSIELLA PNEUMONIAE  Cath Tip Culture     Status: Abnormal   Collection Time: 05/11/19  1:10 PM   Specimen: Catheter Tip; Other  Result Value Ref Range Status   Specimen Description   Final    CATH TIP Performed at [facility], [address] [place], [place] [address]    Special Requests   Final    NONE Performed at [facility], [address] [place], [place] [address]    Culture (A)  Final    >=100,000 COLONIES/mL METHICILLIN RESISTANT STAPHYLOCOCCUS AUREUS   Report Status 05/13/2019 FINAL  Final   Organism ID, Bacteria METHICILLIN RESISTANT STAPHYLOCOCCUS AUREUS (A)  Final      Susceptibility   Methicillin resistant staphylococcus aureus - MIC*    CIPROFLOXACIN >=8 RESISTANT Resistant     ERYTHROMYCIN >=8 RESISTANT Resistant     GENTAMICIN <=0.5 SENSITIVE Sensitive     OXACILLIN >=4 RESISTANT Resistant     TETRACYCLINE <=1 SENSITIVE Sensitive     VANCOMYCIN 1 SENSITIVE Sensitive     TRIMETH/SULFA >=320 RESISTANT Resistant     CLINDAMYCIN <=0.25 SENSITIVE Sensitive     RIFAMPIN <=0.5 SENSITIVE Sensitive  Inducible Clindamycin NEGATIVE Sensitive     * >=100,000 COLONIES/mL METHICILLIN RESISTANT STAPHYLOCOCCUS AUREUS  Culture, blood (routine x 2)     Status: None (Preliminary result)   Collection Time: 05/11/19  4:27 PM   Specimen: BLOOD  Result Value Ref Range Status   Specimen Description BLOOD A-LINE  Final   Special Requests   Final    BOTTLES DRAWN AEROBIC AND ANAEROBIC Blood Culture adequate volume   Culture   Final    NO GROWTH 2 DAYS Performed at [facility], [address], [place], [place] [address]    Report Status PENDING   Incomplete  Culture, blood (routine x 2)     Status: None (Preliminary result)   Collection Time: 05/12/19  6:06 AM   Specimen: BLOOD  Result Value Ref Range Status   Specimen Description BLOOD BLOOD RIGHT HAND  Final   Special Requests   Final    BOTTLES DRAWN AEROBIC AND ANAEROBIC Blood Culture adequate volume   Culture  Setup Time   Final    GRAM POSITIVE COCCI ANAEROBIC BOTTLE ONLY CRITICAL RESULT CALLED TO, READ BACK BY AND VERIFIED WITH: [place] ON 05/12/2019 BY MOSLEY,J Performed at [facility], [address], [place], [place] [address]    Culture [facility] POSITIVE COCCI  Final   Report Status PENDING  Incomplete    Coagulation Studies: No results for input(s): LABPROT, INR in the last 72 hours.  Urinalysis: Recent Labs    06/04/2019 2230  COLORURINE YELLOW*  LABSPEC 1.023  PHURINE 5.0  GLUCOSEU NEGATIVE  HGBUR SMALL*  BILIRUBINUR NEGATIVE  KETONESUR NEGATIVE  PROTEINUR 100*  NITRITE NEGATIVE  LEUKOCYTESUR MODERATE*      Imaging: No results found.   Medications:   . norepinephrine (LEVOPHED) Adult infusion 2 mcg/min (05/13/19 1248)  . vasopressin (PITRESSIN) infusion - *FOR SHOCK* Stopped (05/13/19 0435)   . Chlorhexidine Gluconate Cloth  6 each Topical Daily  . heparin injection (subcutaneous)  5,000 Units Subcutaneous Q8H  . insulin aspart  0-9 Units Subcutaneous Q4H  . pantoprazole (PROTONIX) IV  40 mg Intravenous QHS  . vancomycin variable dose per unstable renal function (pharmacist dosing)   Does not apply See admin instructions     Assessment/ Plan:  [name] is a 74 y.o. black female [name] is a 74 y.o. black female with end stage renal disease on hemodialysis, CVA with left hemiparesis, hypertension, hyperlipidemia, GERD, glaucoma, diabetes mellitus type II, coronary artery disease, gout, seizure disorder who was admitted to [facility] on 06/06/2019 for Hypokalemia [E87.6] Shock ([place]) [R57.9] ESRD (end stage renal  disease) ([place]) [N18.6] Hypotension, unspecified hypotension type [I95.9] Anemia, unspecified type [D64.9]  [facility] MWF RIJ permcath 63.5kg  1. End Stage Renal Disease: last hemodialysis treatment was Friday. No acute indication for dialysis.  Currently without dialysis catheter.  Plan on dialysis catheter later this week.   2. Hypotension: with MRSA bacteremia/sepsis requiring vasopressors: norepinephrine and vasopressin. Source is most likely the permcath.  - Empiric vancomycin and aztreonam - Consult vascular, patient will need tunneled catheter removed and a catheter free period.   3. Metabolic acidosis - recommend PO replacement.   4. Anemia with chronic kidney disease: with iron deficiency and folate deficiency EPO with HD treatments  5. Secondary Hyperparathyroidism: with hypophosphatemia.   6. Malnutrition: albumin 1.3. 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   7. Thrombocytopenia: acute drop most likely due  to infection. Platelet count was 247K on 3/22    LOS: 3 Crystal Vargas 4/6/20211:30 PM

## 2019-05-13 NOTE — Progress Notes (Signed)
Daily Progress Note   Patient Name: Crystal Vargas       Date: 05/13/2019 DOB: 11/01/1945  Age: 74 y.o. MRN#: [id number] Attending Physician: Flora Lipps, MD Primary Care Physician: Marisa Hua, MD Admit Date: 05/24/2019  Reason for Consultation/Follow-up: Establishing goals of care  Subjective: Patient is resting in bed. She cannot tell me where she is. She speaks in 1 word answers. She states "3" when asked how many children she has. She states "no" when asked if she has pain. She says "no" when asked if she needs anything. She stopped answering questions and closed her eyes. No family at bedside.    Length of Stay: 3  Current Medications: Scheduled Meds:  . Chlorhexidine Gluconate Cloth  6 each Topical Daily  . heparin injection (subcutaneous)  5,000 Units Subcutaneous Q8H  . insulin aspart  0-9 Units Subcutaneous Q4H  . pantoprazole (PROTONIX) IV  40 mg Intravenous QHS  . vancomycin variable dose per unstable renal function (pharmacist dosing)   Does not apply See admin instructions    Continuous Infusions: . norepinephrine (LEVOPHED) Adult infusion 2 mcg/min (05/13/19 1248)  . vasopressin (PITRESSIN) infusion - *FOR SHOCK* Stopped (05/13/19 0435)    PRN Meds: morphine injection  Physical Exam Pulmonary:     Effort: Pulmonary effort is normal.  Neurological:     Mental Status: She is alert.             Vital Signs: BP 106/61 (BP Location: Left Arm)   Pulse 65   Temp 98.6 F (37 C) (Axillary)   Resp (!) 24   Ht 5\' 2"  (1.575 m)   Wt 69.5 kg   SpO2 99%   BMI 28.02 kg/m  SpO2: SpO2: 99 % O2 Device: O2 Device: Room Air O2 Flow Rate: O2 Flow Rate (L/min): 1 L/min  Intake/output summary:   Intake/Output Summary (Last 24 hours) at 05/13/2019 1335 Last data filed at  05/13/2019 1248 Gross per 24 hour  Intake 532.83 ml  Output 0 ml  Net 532.83 ml   LBM: Last BM Date: 05/13/19 Baseline Weight: Weight: 65.2 kg Most recent weight: Weight: 69.5 kg       Palliative Assessment/Data:      Patient Active Problem List   Diagnosis Date Noted  . MRSA bacteremia 05/11/2019  . Status post CVA 05/11/2019  .  Schatzki's ring 05/11/2019  . Shock ([place]) 06/01/2019  . Diabetes ([place]) 04/15/2019  . Essential hypertension 04/15/2019  . ESRD on dialysis [facility]) 04/15/2019    Palliative Care Assessment & Plan    Recommendations/Plan: Continue full code/full scope.     Code Status:    Code Status Orders  (From admission, onward)         Start     Ordered   06/02/2019 1615  Full code  Continuous     05/18/2019 1621        Code Status History    This patient has a current code status but no historical code status.   Advance Care Planning Activity      Prognosis: Poor    Thank you for allowing the Palliative Medicine Team to assist in the care of this patient.   Total Time 15 min Prolonged Time Billed  no      Greater than 50%  of this time was spent counseling and coordinating care related to the above assessment and plan.  Asencion Gowda, NP  Please contact Palliative Medicine Team phone at [phone] for questions and concerns.

## 2019-05-13 NOTE — Progress Notes (Signed)
CRITICAL CARE NOTE  ADMITTED FOR SEPTIC SHOCK AND MRSA BACTEREMIA  CC  follow up septic shock  SUBJECTIVE On pressors More awake   BP 93/71   Pulse 69   Temp 98.6 F (37 C) (Axillary)   Resp (!) 30   Ht 5\' 2"  (1.575 m)   Wt 69.5 kg   SpO2 97%   BMI 28.02 kg/m    I/O last 3 completed shifts: In: 971.6 [I.V.:570; IV Piggyback:401.7] Out: 0  Total I/O In: 72.5 [I.V.:32.3; IV Piggyback:40.2] Out: 0   SpO2: 97 % O2 Flow Rate (L/min): 1 L/min FiO2 (%): (!) 2 %  Estimated body mass index is 28.02 kg/m as calculated from the following:   Height as of this encounter: 5\' 2"  (1.575 m).   Weight as of this encounter: 69.5 kg.  SIGNIFICANT EVENTS   REVIEW OF SYSTEMS NO PAIN NO SOB     Pressure Injury 05/09/2019 Heel Left;Medial Unstageable - Full thickness tissue loss in which the base of the injury is covered by slough (yellow, tan, gray, green or brown) and/or eschar (tan, brown or black) in the wound bed. unstagable with eshcar on le (Active)  05/17/2019 1830  Location: Heel  Location Orientation: Left;Medial  Staging: Unstageable - Full thickness tissue loss in which the base of the injury is covered by slough (yellow, tan, gray, green or brown) and/or eschar (tan, brown or black) in the wound bed.  Wound Description (Comments): unstagable with eshcar on left heel  Present on Admission: Yes     Pressure Injury 05/31/2019 Toe (Comment  which one) Left;Medial Deep Tissue Pressure Injury - Purple or maroon localized area of discolored intact skin or blood-filled blister due to damage of underlying soft tissue from pressure and/or shear. deep tissure (Active)  05/16/2019 1830  Location: Toe (Comment  which one) (left big toe)  Location Orientation: Left;Medial  Staging: Deep Tissue Pressure Injury - Purple or maroon localized area of discolored intact skin or blood-filled blister due to damage of underlying soft tissue from pressure and/or shear.  Wound Description  (Comments): deep tissure injury on left big toe  Present on Admission: Yes      PHYSICAL EXAMINATION:  GENERAL:critically ill appearing,  HEAD: Normocephalic, atraumatic.  EYES: Pupils equal, round, reactive to light.  No scleral icterus.  MOUTH: Moist mucosal membrane. NECK: Supple.  PULMONARY: +rhonchi,  CARDIOVASCULAR: S1 and S2. Regular rate and rhythm. No murmurs, rubs, or gallops.  GASTROINTESTINAL: Soft, nontender, -distended.  Positive bowel sounds.   MUSCULOSKELETAL: No swelling, clubbing, or edema.  NEUROLOGIC:alert and awake SKIN:intact,warm,dry  MEDICATIONS: I have reviewed all medications and confirmed regimen as documented   CULTURE RESULTS   Recent Results (from the past 240 hour(s))  Blood culture (routine x 2)     Status: Abnormal   Collection Time: 06/06/2019 12:34 PM   Specimen: BLOOD  Result Value Ref Range Status   Specimen Description   Final    BLOOD R UP ARM Performed at [facility], [address], [place], [place] [address]    Special Requests   Final    BOTTLES DRAWN AEROBIC AND ANAEROBIC Blood Culture adequate volume Performed at [facility], [address] [place], [place] [address]    Culture  Setup Time   Final    Organism ID to follow IN BOTH AEROBIC AND ANAEROBIC BOTTLES GRAM POSITIVE COCCI CRITICAL RESULT CALLED TO, READ BACK BY AND VERIFIED WITH: [place] ON 05/11/19 AT 0210 [facility] Performed at [facility], [place], [place] [address]    Culture (A)  Final    STAPHYLOCOCCUS AUREUS SUSCEPTIBILITIES PERFORMED ON PREVIOUS CULTURE WITHIN THE LAST 5 DAYS. Performed at [facility], [place] [address], [place], [place] [address]    Report Status 05/13/2019 FINAL  Final  Blood Culture ID Panel (Reflexed)     Status: Abnormal   Collection Time: 05/09/2019 12:34 PM  Result Value Ref Range Status   Enterococcus species NOT DETECTED NOT DETECTED Final   Listeria monocytogenes NOT DETECTED NOT  DETECTED Final   Staphylococcus species DETECTED (A) NOT DETECTED Final    Comment: CRITICAL RESULT CALLED TO, READ BACK BY AND VERIFIED WITH: SCOTT HALL ON 05/11/19 AT 0210 [facility]    Staphylococcus aureus (BCID) DETECTED (A) NOT DETECTED Final    Comment: Methicillin (oxacillin)-resistant Staphylococcus aureus (MRSA). MRSA is predictably resistant to beta-lactam antibiotics (except ceftaroline). Preferred therapy is vancomycin unless clinically contraindicated. Patient requires contact precautions if  hospitalized. CRITICAL RESULT CALLED TO, READ BACK BY AND VERIFIED WITH: SCOTT HALL ON 05/11/19 AT 0210 [facility]    Methicillin resistance DETECTED (A) NOT DETECTED Final    Comment: CRITICAL RESULT CALLED TO, READ BACK BY AND VERIFIED WITH: SCOTT HALL ON 05/11/19 AT 0210 [place]    Streptococcus species NOT DETECTED NOT DETECTED Final   Streptococcus agalactiae NOT DETECTED NOT DETECTED Final   Streptococcus pneumoniae NOT DETECTED NOT DETECTED Final   Streptococcus pyogenes NOT DETECTED NOT DETECTED Final   Acinetobacter baumannii NOT DETECTED NOT DETECTED Final   Enterobacteriaceae species NOT DETECTED NOT DETECTED Final   Enterobacter cloacae complex NOT DETECTED NOT DETECTED Final   Escherichia coli NOT DETECTED NOT DETECTED Final   Klebsiella oxytoca NOT DETECTED NOT DETECTED Final   Klebsiella pneumoniae NOT DETECTED NOT DETECTED Final   Proteus species NOT DETECTED NOT DETECTED Final   Serratia marcescens NOT DETECTED NOT DETECTED Final   Haemophilus influenzae NOT DETECTED NOT DETECTED Final   Neisseria meningitidis NOT DETECTED NOT DETECTED Final   Pseudomonas aeruginosa NOT DETECTED NOT DETECTED Final   Candida albicans NOT DETECTED NOT DETECTED Final   Candida glabrata NOT DETECTED NOT DETECTED Final   Candida krusei NOT DETECTED NOT DETECTED Final   Candida parapsilosis NOT DETECTED NOT DETECTED Final   Candida tropicalis NOT DETECTED NOT DETECTED Final    Comment: Performed at [facility], [address] [place], [place] [address]  Blood culture (routine x 2)     Status: Abnormal   Collection Time: 05/08/2019  1:24 PM   Specimen: BLOOD  Result Value Ref Range Status   Specimen Description   Final    BLOOD R LATREAL BICEP Performed at [facility], [address] [place], [place] [address]    Special Requests   Final    BOTTLES DRAWN AEROBIC AND ANAEROBIC Blood Culture adequate volume Performed at [facility], [address] [place], [place] [address]    Culture  Setup Time   Final    IN BOTH AEROBIC AND ANAEROBIC BOTTLES GRAM POSITIVE COCCI CRITICAL VALUE NOTED.  VALUE IS CONSISTENT WITH PREVIOUSLY REPORTED AND CALLED VALUE. Performed at [facility], [address] [place], [place] [address]    Culture METHICILLIN RESISTANT STAPHYLOCOCCUS AUREUS (A)  Final   Report Status 05/13/2019 FINAL  Final   Organism ID, Bacteria METHICILLIN RESISTANT STAPHYLOCOCCUS AUREUS  Final      Susceptibility   Methicillin resistant staphylococcus aureus - MIC*    CIPROFLOXACIN >=8  RESISTANT Resistant     ERYTHROMYCIN >=8 RESISTANT Resistant     GENTAMICIN <=0.5 SENSITIVE Sensitive     OXACILLIN >=4 RESISTANT Resistant     TETRACYCLINE <=1 SENSITIVE Sensitive     VANCOMYCIN <=0.5 SENSITIVE Sensitive     TRIMETH/SULFA >=320 RESISTANT Resistant     CLINDAMYCIN <=0.25 SENSITIVE Sensitive     RIFAMPIN <=0.5 SENSITIVE Sensitive     Inducible Clindamycin NEGATIVE Sensitive     * METHICILLIN RESISTANT STAPHYLOCOCCUS AUREUS  Respiratory Panel by RT PCR (Flu A&B, Covid) - Nasopharyngeal Swab     Status: None   Collection Time: 05/11/2019  1:24 PM   Specimen: Nasopharyngeal Swab  Result Value Ref Range Status   SARS Coronavirus 2 by RT PCR NEGATIVE NEGATIVE Final    Comment: (NOTE) SARS-CoV-2 target nucleic acids are NOT DETECTED. The SARS-CoV-2 RNA is generally detectable in upper respiratoy specimens during the acute phase of infection. The  lowest concentration of SARS-CoV-2 viral copies this assay can detect is 131 copies/mL. A negative result does not preclude SARS-Cov-2 infection and should not be used as the sole basis for treatment or other patient management decisions. A negative result may occur with  improper specimen collection/handling, submission of specimen other than nasopharyngeal swab, presence of viral mutation(s) within the areas targeted by this assay, and inadequate number of viral copies (<131 copies/mL). A negative result must be combined with clinical observations, patient history, and epidemiological information. The expected result is Negative. Fact Sheet for Patients:  PinkCheek.be Fact Sheet for Healthcare Providers:  GravelBags.it This test is not yet ap proved or cleared by the [place] FDA and  has been authorized for detection and/or diagnosis of SARS-CoV-2 by FDA under an Emergency Use Authorization (EUA). This EUA will remain  in effect (meaning this test can be used) for the duration of the COVID-19 declaration under Section 564(b)(1) of the Act, 21 U.S.C. section 360bbb-3(b)(1), unless the authorization is terminated or revoked sooner.    Influenza A by PCR NEGATIVE NEGATIVE Final   Influenza B by PCR NEGATIVE NEGATIVE Final    Comment: (NOTE) The Xpert Xpress SARS-CoV-2/FLU/RSV assay is intended as an aid in  the diagnosis of influenza from Nasopharyngeal swab specimens and  should not be used as a sole basis for treatment. Nasal washings and  aspirates are unacceptable for Xpert Xpress SARS-CoV-2/FLU/RSV  testing. Fact Sheet for Patients: PinkCheek.be Fact Sheet for Healthcare Providers: GravelBags.it This test is not yet approved or cleared by the [place] FDA and  has been authorized for detection and/or diagnosis of SARS-CoV-2 by  FDA under an Emergency  Use Authorization (EUA). This EUA will remain  in effect (meaning this test can be used) for the duration of the  Covid-19 declaration under Section 564(b)(1) of the Act, 21  U.S.C. section 360bbb-3(b)(1), unless the authorization is  terminated or revoked. Performed at [facility], [address] [place], [place] [address]   MRSA PCR Screening     Status: None   Collection Time: 05/22/2019  6:27 PM   Specimen: Nasopharyngeal  Result Value Ref Range Status   MRSA by PCR NEGATIVE NEGATIVE Final    Comment:        The GeneXpert MRSA Assay (FDA approved for NASAL specimens only), is one component of a comprehensive MRSA colonization surveillance program. It is not intended to diagnose MRSA infection nor to guide or monitor treatment for MRSA infections. Performed at [facility], [address] [place], [place] [address]  Urine culture     Status: Abnormal (Preliminary result)   Collection Time: 05/09/2019 10:30 PM   Specimen: Urine, Clean Catch  Result Value Ref Range Status   Specimen Description   Final    URINE, CLEAN CATCH Performed at [facility], [address], [place], [place] [address]    Special Requests   Final    NONE Performed at [facility], [place], [place] [address]    Culture (A)  Final    >=100,000 COLONIES/mL KLEBSIELLA PNEUMONIAE >=100,000 COLONIES/mL ENTEROCOCCUS FAECIUM CULTURE REINCUBATED FOR BETTER GROWTH Performed at [facility], [place] [address], [place], [place] [address]    Report Status PENDING  Incomplete   Organism ID, Bacteria KLEBSIELLA PNEUMONIAE (A)  Final      Susceptibility   Klebsiella pneumoniae - MIC*    AMPICILLIN >=32 RESISTANT Resistant     CEFAZOLIN <=4 SENSITIVE Sensitive     CEFTRIAXONE <=0.25 SENSITIVE Sensitive     CIPROFLOXACIN <=0.25 SENSITIVE Sensitive     GENTAMICIN <=1 SENSITIVE Sensitive     IMIPENEM 0.5 SENSITIVE Sensitive     NITROFURANTOIN 64  INTERMEDIATE Intermediate     TRIMETH/SULFA <=20 SENSITIVE Sensitive     AMPICILLIN/SULBACTAM 4 SENSITIVE Sensitive     PIP/TAZO <=4 SENSITIVE Sensitive     * >=100,000 COLONIES/mL KLEBSIELLA PNEUMONIAE  Cath Tip Culture     Status: Abnormal   Collection Time: 05/11/19  1:10 PM   Specimen: Catheter Tip; Other  Result Value Ref Range Status   Specimen Description   Final    CATH TIP Performed at [facility], [address] [place], [place] [address]    Special Requests   Final    NONE Performed at [facility], [address] [place], [place] [address]    Culture (A)  Final    >=100,000 COLONIES/mL METHICILLIN RESISTANT STAPHYLOCOCCUS AUREUS   Report Status 05/13/2019 FINAL  Final   Organism ID, Bacteria METHICILLIN RESISTANT STAPHYLOCOCCUS AUREUS (A)  Final      Susceptibility   Methicillin resistant staphylococcus aureus - MIC*    CIPROFLOXACIN >=8 RESISTANT Resistant     ERYTHROMYCIN >=8 RESISTANT Resistant     GENTAMICIN <=0.5 SENSITIVE Sensitive     OXACILLIN >=4 RESISTANT Resistant     TETRACYCLINE <=1 SENSITIVE Sensitive     VANCOMYCIN 1 SENSITIVE Sensitive     TRIMETH/SULFA >=320 RESISTANT Resistant     CLINDAMYCIN <=0.25 SENSITIVE Sensitive     RIFAMPIN <=0.5 SENSITIVE Sensitive     Inducible Clindamycin NEGATIVE Sensitive     * >=100,000 COLONIES/mL METHICILLIN RESISTANT STAPHYLOCOCCUS AUREUS  Culture, blood (routine x 2)     Status: None (Preliminary result)   Collection Time: 05/11/19  4:27 PM   Specimen: BLOOD  Result Value Ref Range Status   Specimen Description BLOOD A-LINE  Final   Special Requests   Final    BOTTLES DRAWN AEROBIC AND ANAEROBIC Blood Culture adequate volume   Culture  Setup Time   Final    GRAM POSITIVE COCCI ANAEROBIC BOTTLE ONLY CRITICAL VALUE NOTED.  VALUE IS CONSISTENT WITH PREVIOUSLY REPORTED AND CALLED VALUE. Performed at [facility], [address] [place], [place] [address]    Culture [facility]  POSITIVE COCCI  Final   Report Status PENDING  Incomplete  Culture, blood (routine x 2)     Status: None (Preliminary result)   Collection Time: 05/12/19  6:06 AM   Specimen: BLOOD  Result Value Ref  Range Status   Specimen Description BLOOD BLOOD RIGHT HAND  Final   Special Requests   Final    BOTTLES DRAWN AEROBIC AND ANAEROBIC Blood Culture adequate volume   Culture  Setup Time   Final    GRAM POSITIVE COCCI ANAEROBIC BOTTLE ONLY CRITICAL RESULT CALLED TO, READ BACK BY AND VERIFIED WITH: [place] ON 05/12/2019 BY MOSLEY,J Performed at [facility], [address] [place], [place] [address]    Culture [facility] POSITIVE COCCI  Final   Report Status PENDING  Incomplete          IMAGING    No results found.   Nutrition Status:           Indwelling Urinary Catheter continued, requirement due to   Reason to continue Indwelling Urinary Catheter strict Intake/Output monitoring for hemodynamic instability   Central Line/ continued, requirement due to  Reason to continue [place] of central venous pressure or other hemodynamic parameters and poor IV access   Ventilator continued, requirement due to severe respiratory failure   Ventilator Sedation RASS 0 to -2      ASSESSMENT AND PLAN SYNOPSIS SEPTIC Shock MRSA bacteremia Low oncotic pressure due to hypoalbuminemia We[place] pressors as tolerated  ESRD on HD HD per nephrology PERM CATH to be placed in 48 hrs    NEUROLOGY Avoid sedatives   CARDIAC ICU monitoring  ID -continue IV abx as prescibed -follow up cultures  GI GI PROPHYLAXIS as indicated  NUTRITIONAL STATUS Nutrition Status:         DIET--> as tolerated Constipation protocol as indicated  ENDO - will use ICU hypoglycemic\Hyperglycemia protocol if indicated   ELECTROLYTES -follow labs as needed -replace as needed -pharmacy consultation and following  Severe protein calorie malnutrition Poor p.o.  intake due to chronic issues with dysphagia Querysideropenicdysphagia Recommend Palliative care consultation  Severe deconditioning Dense left hemiparesis  Prophylaxis No anticoagulants due to thrombocytopenia SCDs GI prophylaxis as needed      GI PRX ordered TRANSFUSIONS AS NEEDED MONITOR FSBS ASSESS the need for LABS as needed    prognosis is guarded/poor.   Corrin Parker, M.D.  [facility]  Medical Director [place] Director [facility] Cardio-Pulmonary Department

## 2019-05-13 NOTE — Evaluation (Signed)
Clinical/Bedside Swallow Evaluation Patient Details  Name: Crystal Vargas MRN: [id number] Date of Birth: 06-03-1945  Today's Date: 05/13/2019 Time: SLP Start Time (ACUTE ONLY): 21 SLP Stop Time (ACUTE ONLY): 1550 SLP Time Calculation (min) (ACUTE ONLY): 45 min  Past Medical History:  Past Medical History:  Diagnosis Date  . Chronic kidney disease    RENAL INSUFFICIENCY  . Coronary artery disease   . Depression   . Diabetes mellitus without complication ([place])   . GERD (gastroesophageal reflux disease)   . Glaucoma   . Hyperlipidemia   . Hypertension   . Stroke [facility])    Past Surgical History:  Past Surgical History:  Procedure Laterality Date  . ABDOMINAL HYSTERECTOMY    . ESOPHAGOGASTRODUODENOSCOPY (EGD) WITH PROPOFOL N/A 01/21/2018   Procedure: ESOPHAGOGASTRODUODENOSCOPY (EGD) WITH PROPOFOL;  Surgeon: Manya Silvas, MD;  Location: [facility] ENDOSCOPY;  Service: Endoscopy;  Laterality: N/A;   HPI:  Pt is a 74 year old lifelong never smoker who has been followed by [facility] nephrology and initiated dialysis in January.  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  She is w/c bound.  She has chronic issues with dysphagia due to Schatzki's ring and has been evaluated by GI and has had esophageal diltation in the past.  Daughter also stated pt had a "Feeding Tube after her stroke because she wasn't eating enough" -- FTT?Marland Kitchen  The patient cannot provide reliable history due to altered mental status.  Daughter who is present with her states that she has had poor p.o. intake due to dysphagia.  She currently resides in a facility in [place] since 2012 due to needing skilled nursing care.  Pt had been declining x2 weeks, reported by speech therapy at [facility] where pt resides.  Pt was admitted w/ septic shock and MRSA Bacteremia; MD noted Severe protein calorie malnutrition.  A Palliative Care consult is  ordered for Crystal Vargas.    Assessment / Plan / Recommendation Clinical Impression  Pt appears to present w/ mild-moderate oropharyngeal phase most impacted by decline in medical and Cognitive status' currently. Pt has baseline of an old large R MCA w/ Dysphagia w/ L sided weakness; slow verbal responses and reduced engagement overall. Pt exhibits flat affect. W/ verbal/visual cues, pt participated in BSE. Dtr encouraged stating "we want you to eat". Dtr described issues w/ oral intake post the old stroke requiring PEG placement then -- "pt was not eating enough" per Dtr. Pt has been on an oral diet at her NH but decline in status and Severe protein calorie malnutrition is noted per MD/chart notes. Pt also has a h/o Esophageal phase Dysphagia w/ Dysmotility necessitating Dilation. When presented w/ modified food and liquid consistencies, pt demonstrated adequate toleration of po trials w/ no overt clinical s/s of aspiration noted. Pt appears at reduced risk for aspiration w/ a modified diet consistency and aspiration precautions. Pt does require full feeding support. Pt consumed trials of Nectar liquids via tsp and Straw(did best drinking via Straw to take the amount she wanted), and purees w/ no overt clinical s/s of aspiration noted; no wet vocal quality or decline in respiratory status noted. O2 sats remained 99-100%. Oral phase appeared grossly [facility] for bolus management, timely A-P transfer, and for oral clearing w/ consistencies given. Pt consistently exhibited declined oral acceptance w/ guarded mouth opening for bolus acceptance -- aversion?. OM exam(brief) appeared grossly [facility] w/ no unilateral weakness noted. Recommend a  Dysphagia level 1 (puree) diet w/ Nectar consistency liquids; aspiration precautions; feeding support at meals; Pills Crushed in Puree for safer swallowing. Reflux precautions recommended d/t pt's significant h/o Esophageal phase Dysmotility. ST services will continue to f/u for trials to upgrade  diet consistency when pt is ready; medical status is appropriate. MD/NSG updated. Palliative Care consulted noted.  SLP Visit Diagnosis: Dysphagia, oropharyngeal phase (R13.12)(Cognitive decline; previous CVA)    Aspiration Risk  Mild aspiration risk;Moderate aspiration risk;Risk for inadequate nutrition/hydration(reduced w/ modified diet consistency)    Diet Recommendation  Dysphagia level 1 (puree) w/ Nectar liquids; general aspiration precautions. Feeding Support at meals, encouragement. REFLUX precautions.   Medication Administration: Crushed with puree(for safer swallowing)    Other  Recommendations Recommended Consults: (Dietician f/u; Palliative Care f/u) Oral Care Recommendations: Oral care BID;Oral care before and after PO;Staff/trained caregiver to provide oral care Other Recommendations: Order thickener from pharmacy;Prohibited food (jello, ice cream, thin soups);Remove water pitcher;Have oral suction available   Follow up Recommendations (TBD)      Frequency and Duration min 3x week  2 weeks       Prognosis Prognosis for Safe Diet Advancement: Fair Barriers to Reach Goals: Cognitive deficits;Time post onset;Severity of deficits;Behavior(previous PEG for FTT described)      Swallow Study   General Date of Onset: 05/12/2019 HPI: Pt is a 74 year old lifelong never smoker who has been followed by [facility] nephrology and initiated dialysis in January.  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  She has chronic issues with dysphagia due to Schatzki's ring and has been evaluated by GI and has had esophageal dilatation in the past.  Daughter also stated pt had a "Feeding Tube after her stroke because she wasn't eating enough".  The patient cannot provide reliable history due to altered mental status.  Daughter who is present with her states that she has had poor p.o. intake due to dysphagia.  She currently  resides in a facility in [place] due to needing skilled nursing care.  Pt had been declining x2 weeks, reported by speech therapy at [facility] where pt resides.  Pt was admitted w/ septic shock and MRSA Bacteremia; MD noted Severe protein calorie malnutrition.  A Palliative Care consult is ordered for Crystal Vargas.  Type of Study: Bedside Swallow Evaluation Previous Swallow Assessment: 12/2017 - MBSS revealed "presenting with mild-moderate oropharyngeal dysphagia characterized by disorganized, prolonged posterior transfer, delayed pharyngeal swallow initiation, laryngeal penetration during swallow with liquids (nectar-thick and thin), and one episode of trace aspiration w/ thin liquid". Pt has an extensive h/o Esophageal phase Dysmotility noted in 2015, 2017, 2019: "Moderate to severe esophageal dysmotility; spasm of the distal esophagus; weak peristalsis; relative narrowing of upper cervical esophagus".  Diet Prior to this Study: NPO(unsure of diet prior) Temperature Spikes Noted: No(wbc 5.1) Respiratory Status: Nasal cannula(1-2 L) History of Recent Intubation: No Behavior/Cognition: Alert;Cooperative;Pleasant mood;Confused;Distractible;Requires cueing(unsure of pt's baseline Cognitive-linguistic functioning) Oral Cavity Assessment: Within Functional Limits Oral Care Completed by SLP: Yes Oral Cavity - Dentition: Adequate natural dentition Vision: (n/a) Self-Feeding Abilities: Total assist(did not attempt to feed self) Patient Positioning: Upright in bed(needed full positioning support) Baseline Vocal Quality: Low vocal intensity(mumbled speech x2) Volitional Cough: Cognitively unable to elicit Volitional Swallow: Unable to elicit    Oral/Motor/Sensory Function Overall Oral Motor/Sensory Function: Within functional limits(appeared WFL w/ lingual/labial movements w/ po's)   Ice Chips Ice chips: Not tested   Thin Liquid Thin Liquid:  Not tested    Nectar Thick Nectar Thick Liquid: Within functional  limits Presentation: Spoon;Straw(fed; 10 trials total) Other Comments: small, guarded sips   Honey Thick Honey Thick Liquid: Not tested   Puree Puree: Impaired Presentation: Spoon(fed; 8 trials) Oral Phase Impairments: Poor awareness of bolus(durign prep phase -- guarded bites accepted) Oral Phase Functional Implications: (none) Pharyngeal Phase Impairments: (none) Other Comments: accepted minimal amounts   Solid     Solid: Not tested       Orinda Kenner, MS, CCC-SLP Kairi Harshbarger 05/13/2019,4:45 PM

## 2019-05-13 NOTE — Evaluation (Signed)
Physical Therapy Evaluation Patient Details Name: Crystal Vargas MRN: [id number] DOB: August 06, 1945 Today's Date: 05/13/2019   History of Present Illness  74 year old lifelong never smoker who has been followed by [facility] nephrology and initiated dialysis in January.  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 Here with septic shock, now s/p removal of infected dialysis catheter 4/4.  Clinical Impression  Pt very functionally limited with all aspects of PT exam.  She was able to give some minimal A/AAROM (R>L) with testing but struggled to really do much functionally.  L side weaker and with more limitations (foot eversion, hand flexor tone) and pt needed max assist to attain and then maintain sitting EOB, very poor postural strength or ability to maintain.  Unsure of true baseline, but pt indicates that she can help with getting to/fromm w/c and BSC.  Regardless pt is very weak and functionally limited - she will need 24/7 supervision and would benefit from continued PT to try and maximize her level of mobility and independence.    Follow Up Recommendations SNF(in baseline [facility] setting)    Equipment Recommendations  None recommended by PT    Recommendations for Other Services       Precautions / Restrictions Precautions Precautions: Fall Restrictions Weight Bearing Restrictions: No LUE Weight Bearing: Non weight bearing LLE Weight Bearing: Non weight bearing      Mobility  Bed Mobility Overal bed mobility: Needs Assistance Bed Mobility: Sit to Supine;Supine to Sit     Supine to sit: Total assist Sit to supine: Total assist   General bed mobility comments: Pt with little to no ability to assist getting to sitting, completely unable to maintain posture/balance at EOB  Transfers                 General transfer comment: unsafe to attempt, pt apparently is able to assist with transfers to w/c, etc  at baseline; not so today  Ambulation/Gait                Stairs            Wheelchair Mobility    Modified Rankin (Stroke Patients Only)       Balance Overall balance assessment: Needs assistance Sitting-balance support: Bilateral upper extremity supported Sitting balance-Leahy Scale: Zero Sitting balance - Comments: Pt needing heavy assist just to remain somewhat upright at EOB, very poor tolerance to sitting                                     Pertinent Vitals/Pain Pain Assessment: Faces Faces Pain Scale: Hurts little more Pain Location: Pt "owing" with any and all palpation bilaterally    Home Living Family/patient expects to be discharged to:: Skilled nursing facility                 Additional Comments: from [facility]    Prior Function Level of Independence: Needs assistance         Comments: Pt is not able to effectively communicate, but she and previous notes indicate she was w/c bound     Hand Dominance        Extremity/Trunk Assessment   Upper Extremity Assessment Upper Extremity Assessment: Generalized weakness(L UE with no AROM/mod hand flexion tone; R 2/5)    Lower Extremity Assessment Lower Extremity Assessment: Generalized weakness(L grossly 2/5, R grossly  3/5 when following cues)       Communication   Communication: Expressive difficulties(minimal verbalizations, not forthcoming when present)  Cognition Arousal/Alertness: Lethargic Behavior During Therapy: Flat affect Overall Cognitive Status: Difficult to assess                                 General Comments: unsure of baseline, however pt minimally interactive this date, brief answers/comments when able/willing to speak      General Comments      Exercises General Exercises - Lower Extremity Ankle Circles/Pumps: PROM;10 reps;AAROM(inconsistent, AAROM on R, L PROM) Short Arc Quad: AAROM;5 reps;PROM(limited consistency with  AROM) Heel Slides: AAROM;PROM;5 reps(AAROM less pt assist on L - light resistance on leg ext) Hip ABduction/ADduction: AAROM;PROM;5 reps   Assessment/Plan    PT Assessment Patient needs continued PT services  PT Problem List Decreased strength;Decreased range of motion;Decreased activity tolerance;Decreased balance;Decreased mobility;Decreased coordination;Decreased knowledge of use of DME;Decreased cognition;Decreased safety awareness;Pain       PT Treatment Interventions DME instruction;Gait training;Functional mobility training;Balance training;Therapeutic activities;Therapeutic exercise;Neuromuscular re-education;Cognitive remediation;Patient/family education    PT Goals (Current goals can be found in the Care Plan section)  Acute Rehab PT Goals Patient Stated Goal: none stated PT Goal Formulation: Patient unable to participate in goal setting Time For Goal Achievement: 05/11/2019 Potential to Achieve Goals: Fair    Frequency Min 2X/week   Barriers to discharge        Co-evaluation               AM-PAC PT "6 Clicks" Mobility  Outcome Measure Help needed turning from your back to your side while in a flat bed without using bedrails?: Total Help needed moving from lying on your back to sitting on the side of a flat bed without using bedrails?: Total Help needed moving to and from a bed to a chair (including a wheelchair)?: Total Help needed standing up from a chair using your arms (e.g., wheelchair or bedside chair)?: Total Help needed to walk in hospital room?: Total Help needed climbing 3-5 steps with a railing? : Total 6 Click Score: 6    End of Session Equipment Utilized During Treatment: Gait belt Activity Tolerance: Patient limited by fatigue;Patient limited by lethargy Patient left: with bed alarm set;with call bell/phone within reach Nurse Communication: Mobility status PT Visit Diagnosis: Muscle weakness (generalized) (M62.81);Other abnormalities of gait and  mobility (R26.89)    Time: 4081-4481 PT Time Calculation (min) (ACUTE ONLY): 23 min   Charges:   PT Evaluation $PT Eval Low Complexity: 1 Low PT Treatments $Therapeutic Exercise: 8-22 mins        Kreg Shropshire, DPT 05/13/2019, 2:08 PM

## 2019-05-13 NOTE — Evaluation (Signed)
Occupational Therapy Evaluation Patient Details Name: Crystal Vargas MRN: [id number] DOB: 07-22-1945 Today's Date: 05/13/2019    History of Present Illness 74 year old lifelong never smoker who has been followed by [facility] nephrology and initiated dialysis in January.  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 Here with septic shock, MRSA bacteremia.   Clinical Impression   Pt was seen for OT evaluation this date. Prior to hospital admission, pt was requiring assistance from caregiving staff at facility for dressing, bathing and grooming as well as for transfers to w/c. Pt was using w/c for fxl mobility with assistance to propel and indicates she was somewhat able to contribute to SPS transfers to chair. Pt lives in [facility] since 2012. Currently pt demonstrates impairments as described below (See OT problem list) which functionally limit her ability to perform ADL/self-care tasks. Pt currently requires MAX to TOTAL A for rolling bed mobility and MIN A with grooming/oral care at bed level with HOB elevated with MIN verbal cues for initiation and sequencing.  Pt would benefit from skilled OT to address noted impairments and functional limitations (see below for any additional details) in order to maximize safety and independence while minimizing falls risk and caregiver burden. Upon hospital discharge, recommend SNF to maximize pt safety and return to PLOF.     Follow Up Recommendations  SNF;Supervision/Assistance - 24 hour    Equipment Recommendations  Other (comment)(defer to next level of care)    Recommendations for Other Services       Precautions / Restrictions Precautions Precautions: Fall Restrictions Weight Bearing Restrictions: No      Mobility Bed Mobility Overal bed mobility: Needs Assistance Bed Mobility: Rolling Rolling: Max assist;Total assist     General bed  mobility comments: Pt does make some effort to reach with R UE when rolling toward L side, but ultimately requires TOTAL A.  Transfers                 General transfer comment: unable to attempt at this time as pt very fatigued and unable to acheive sup to sit on OT Assessment. Per PT note, pt is somewhat able to assist with t/f to w/c at baseline.    Balance                                    ADL either performed or assessed with clinical judgement   ADL Overall ADL's : Needs assistance/impaired   Eating/Feeding Details (indicate cue type and reason): NPO at this time Grooming: Oral care;Bed level;Minimal assistance Grooming Details (indicate cue type and reason): HOB elevated, setup, MIN A and MIN verbal/tactile cues for thorough completion of oral care with oral swab x3 trials as well as initiation cues.         Upper Body Dressing : Moderate assistance;Maximal assistance;Bed level Upper Body Dressing Details (indicate cue type and reason): based on clinical observation Lower Body Dressing: Maximal assistance;Total assistance;Bed level Lower Body Dressing Details (indicate cue type and reason): based on clinical observation     Toileting- Clothing Manipulation and Hygiene: Total assistance Toileting - Clothing Manipulation Details (indicate cue type and reason): MAX to TOTAL A to roll in bed, currently with external catheter.             Vision   Additional Comments: difficult to formally assess.  Pt appears to mostly track appropriately when she is visually attending.     Perception     Praxis      Pertinent Vitals/Pain Pain Assessment: Faces Faces Pain Scale: Hurts little more Pain Location: Pt with flat affect, but states "ouch" with any palpation biaterally during OT assessment. Pain Intervention(s): Limited activity within patient's tolerance;Monitored during session     Hand Dominance     Extremity/Trunk Assessment Upper Extremity  Assessment Upper Extremity Assessment: Generalized weakness(R elbow and grip 3+/5, shld MMT 3-/5. L UE no AROM, some flexor tone in L elbow.)   Lower Extremity Assessment Lower Extremity Assessment: Defer to PT evaluation;Generalized weakness       Communication Communication Communication: Expressive difficulties(minimal and soft/raspy verbalizations)   Cognition Arousal/Alertness: Lethargic Behavior During Therapy: Flat affect Overall Cognitive Status: Difficult to assess                                 General Comments: Pt with brief/soft/sometimes delayed responses to PLOF questions. Oriented to self and some location information. disoriented to situation and time. Mostly appropriate with command following, requires extended time.   General Comments       Exercises Other Exercises Other Exercises: OT facilitates education with pt and daughter, [name], role of OT in acute setting and potentially at SNF setting upon d/c. Pt's daughter verbalized good understanding. Other Exercises: OT facilitates education re: importance of oral care including prevention of bacterial growth. Pt with some understanding and participation demo'ed. Pt's dtr verbalized good understanding.   Shoulder Instructions      Home Living Family/patient expects to be discharged to:: Skilled nursing facility                                 Additional Comments: from [facility]      Prior Functioning/Environment Level of Independence: Needs assistance  Gait / Transfers Assistance Needed: Pt is questionable historian. Her daughter is present throughout and indicates pt uses w/c for fxl mobility at baseline and requires assist to propel. Reports being able to participate in fxl SPS transfers to/from w/c. ADL's / Homemaking Assistance Needed: Pt reports she requires assistance for bathing/dressing/grooming at baseline and her daughter-Robin who is present throughout-corroborates.  Pt does indicate being able to hold herself up during assistance with these ADLs.   Comments: Pt is not able to effectively communicate, but she and previous notes indicate she was w/c bound        OT Problem List: Decreased strength;Decreased range of motion;Decreased activity tolerance;Impaired balance (sitting and/or standing);Decreased coordination;Decreased cognition;Decreased knowledge of use of DME or AE;Impaired sensation;Impaired tone;Impaired UE functional use;Pain      OT Treatment/Interventions: Self-care/ADL training;Therapeutic exercise;DME and/or AE instruction;Therapeutic activities;Patient/family education;Balance training    OT Goals(Current goals can be found in the care plan section) Acute Rehab OT Goals Patient Stated Goal: to have pt overall feel better OT Goal Formulation: With family Time For Goal Achievement: 05/10/2019 Potential to Achieve Goals: Fair  OT Frequency: Min 2X/week   Barriers to D/C:            Co-evaluation              AM-PAC OT "6 Clicks" Daily Activity     Outcome Measure Help from another person eating meals?: A Little(NPO at this time, demos R UE ROM necessary to complete hand to mouth  with assist.) Help from another person taking care of personal grooming?: A Little Help from another person toileting, which includes using toliet, bedpan, or urinal?: Total Help from another person bathing (including washing, rinsing, drying)?: Total Help from another person to put on and taking off regular upper body clothing?: A Lot Help from another person to put on and taking off regular lower body clothing?: Total 6 Click Score: 11   End of Session    Activity Tolerance: Patient limited by fatigue;Patient limited by lethargy Patient left: in bed;with call bell/phone within reach;with bed alarm set;with family/visitor present  OT Visit Diagnosis: Other abnormalities of gait and mobility (R26.89);Muscle weakness (generalized) (M62.81)                 Time: 4742-5956 OT Time Calculation (min): 44 min Charges:  OT General Charges $OT Visit: 1 Visit OT Evaluation $OT Eval Moderate Complexity: 1 Mod OT Treatments $Self Care/Home Management : 8-22 mins $Therapeutic Activity: 8-22 mins  Gerrianne Scale, MS, OTR/L ascom [phone] 05/13/19, 4:23 PM

## 2019-05-13 NOTE — Consult Note (Signed)
Pharmacy Antibiotic Note  Crystal Vargas is a 74 y.o. female admitted on 06/02/2019 with sepsis.  Pharmacy has been consulted for Vancomycin. Patient receives hemodialysis on Monday, Wednesday and Friday. Plan: Vancomycin random ~ 32. Will continue to monitor for dialysis plan.   Height: 5\' 2"  (157.5 cm) Weight: 69.5 kg (153 lb 3.5 oz) IBW/kg (Calculated) : 50.1  Temp (24hrs), Avg:98.5 F (36.9 C), Min:97.9 F (36.6 C), Max:98.9 F (37.2 C)  Recent Labs  Lab 05/23/2019 1324 05/11/2019 2135 05/17/2019 2306 05/11/19 0611 05/12/19 0502 05/12/19 0607 05/12/19 1506 05/13/19 0556 05/13/19 1420  WBC 9.4  --   --  8.5  --  9.9  --  5.1  --   CREATININE 1.68* 2.04*  --  2.13* 2.47*  --   --  3.01*  --   LATICACIDVEN 1.9  --  1.5 1.4  --   --   --   --   --   VANCOTROUGH  --   --   --   --   --   --   --   --  109*  VANCORANDOM  --   --   --   --   --   --  17  --   --     Estimated Creatinine Clearance: 14.5 mL/min (A) (by C-G formula based on SCr of 3.01 mg/dL (H)).    Allergies  Allergen Reactions  . Abacavir Other (See Comments)  . Cephalosporins Other (See Comments)  . Ciprofloxacin Other (See Comments)  . Nsaids Other (See Comments)  . Penicillin G Other (See Comments)  . Quinolones Other (See Comments)  . Salicylates Other (See Comments)    Antimicrobials this admission: Vancomycin 4/3 >>  Azactam 4/3 x 1  Microbiology results: 4/3 BCx: MRSA 4/3 UCx: Kleb - Likely contamination  4/3 MRSA PCR: negative  Thank you for allowing pharmacy to be a part of this patient's care.  Boyd Buffalo L 05/13/2019 5:20 PM

## 2019-05-13 NOTE — Progress Notes (Signed)
PHARMACY - PHYSICIAN COMMUNICATION CRITICAL VALUE ALERT - BLOOD CULTURE IDENTIFICATION (BCID)  Crystal Vargas is an 74 y.o. female who presented to [facility] on 05/24/2019 with a chief complaint of AMS w/ h/o ESRD on dialysis MWF  Assessment:  04/03: 4/4 GPC BCID Staph aureus methicillin resistance positive (MRSA); 04/05 1/2 GPC, cath tip also positive for S. Aureus from permcath. Vascular has been consulted for tunneled cath placement.  Name of physician (or Provider) Contacted: Rufina Falco  Current antibiotics: vancomycin  Changes to prescribed antibiotics recommended:  Patient is on recommended antibiotics - No changes needed  Results for orders placed or performed during the hospital encounter of 05/23/2019  Blood Culture ID Panel (Reflexed) (Collected: 05/17/2019 12:34 PM)  Result Value Ref Range   Enterococcus species NOT DETECTED NOT DETECTED   Listeria monocytogenes NOT DETECTED NOT DETECTED   Staphylococcus species DETECTED (A) NOT DETECTED   Staphylococcus aureus (BCID) DETECTED (A) NOT DETECTED   Methicillin resistance DETECTED (A) NOT DETECTED   Streptococcus species NOT DETECTED NOT DETECTED   Streptococcus agalactiae NOT DETECTED NOT DETECTED   Streptococcus pneumoniae NOT DETECTED NOT DETECTED   Streptococcus pyogenes NOT DETECTED NOT DETECTED   Acinetobacter baumannii NOT DETECTED NOT DETECTED   Enterobacteriaceae species NOT DETECTED NOT DETECTED   Enterobacter cloacae complex NOT DETECTED NOT DETECTED   Escherichia coli NOT DETECTED NOT DETECTED   Klebsiella oxytoca NOT DETECTED NOT DETECTED   Klebsiella pneumoniae NOT DETECTED NOT DETECTED   Proteus species NOT DETECTED NOT DETECTED   Serratia marcescens NOT DETECTED NOT DETECTED   Haemophilus influenzae NOT DETECTED NOT DETECTED   Neisseria meningitidis NOT DETECTED NOT DETECTED   Pseudomonas aeruginosa NOT DETECTED NOT DETECTED   Candida albicans NOT DETECTED NOT DETECTED   Candida glabrata NOT DETECTED NOT  DETECTED   Candida krusei NOT DETECTED NOT DETECTED   Candida parapsilosis NOT DETECTED NOT DETECTED   Candida tropicalis NOT DETECTED NOT DETECTED   Tobie Lords, PharmD, BCPS Clinical Pharmacist 05/13/2019  1:52 AM

## 2019-05-13 NOTE — Progress Notes (Signed)
ID MRSA bacteremia with sepsis On pressor More alert and responding to questions [facility] 4/3 MRSA ( vanco MIC 1) 4/4 permacath removed- tip MRSA 4/5 BC- gram positive cocci anerobic bottle  Continue vanco Repeat culture Will need TEE

## 2019-05-14 DIAGNOSIS — B9562 Methicillin resistant Staphylococcus aureus infection as the cause of diseases classified elsewhere: Secondary | ICD-10-CM | POA: Diagnosis not present

## 2019-05-14 DIAGNOSIS — T827XXA Infection and inflammatory reaction due to other cardiac and vascular devices, implants and grafts, initial encounter: Secondary | ICD-10-CM | POA: Diagnosis not present

## 2019-05-14 DIAGNOSIS — N186 End stage renal disease: Secondary | ICD-10-CM | POA: Diagnosis not present

## 2019-05-14 DIAGNOSIS — R7881 Bacteremia: Secondary | ICD-10-CM | POA: Diagnosis not present

## 2019-05-14 DIAGNOSIS — G8194 Hemiplegia, unspecified affecting left nondominant side: Secondary | ICD-10-CM

## 2019-05-14 LAB — GLUCOSE, CAPILLARY
Glucose-Capillary: 104 mg/dL — ABNORMAL HIGH (ref 70–99)
Glucose-Capillary: 116 mg/dL — ABNORMAL HIGH (ref 70–99)
Glucose-Capillary: 117 mg/dL — ABNORMAL HIGH (ref 70–99)
Glucose-Capillary: 144 mg/dL — ABNORMAL HIGH (ref 70–99)
Glucose-Capillary: 189 mg/dL — ABNORMAL HIGH (ref 70–99)
Glucose-Capillary: 197 mg/dL — ABNORMAL HIGH (ref 70–99)

## 2019-05-14 LAB — URINE CULTURE: Culture: >=100000 — AB

## 2019-05-14 LAB — RENAL FUNCTION PANEL
Albumin: 2.7 g/dL — ABNORMAL LOW (ref 3.5–5.0)
Anion gap: 15 (ref 5–15)
BUN: 44 mg/dL — ABNORMAL HIGH (ref 8–23)
CO2: 16 mmol/L — ABNORMAL LOW (ref 22–32)
Calcium: 9.4 mg/dL (ref 8.9–10.3)
Chloride: 112 mmol/L — ABNORMAL HIGH (ref 98–111)
Creatinine, Ser: 3.54 mg/dL — ABNORMAL HIGH (ref 0.44–1.00)
GFR calc Af Amer: 14 mL/min — ABNORMAL LOW (ref >60)
GFR calc non Af Amer: 12 mL/min — ABNORMAL LOW (ref >60)
Glucose, Bld: 123 mg/dL — ABNORMAL HIGH (ref 70–99)
Phosphorus: 3.1 mg/dL (ref 2.5–4.6)
Potassium: 4.3 mmol/L (ref 3.5–5.1)
Sodium: 143 mmol/L (ref 135–145)

## 2019-05-14 LAB — CBC
HCT: 22.2 % — ABNORMAL LOW (ref 36.0–46.0)
Hemoglobin: 8.3 g/dL — ABNORMAL LOW (ref 12.0–15.0)
MCH: 32.3 pg (ref 26.0–34.0)
MCHC: 37.4 g/dL — ABNORMAL HIGH (ref 30.0–36.0)
MCV: 86.4 fL (ref 80.0–100.0)
Platelets: 59 10*3/uL — ABNORMAL LOW (ref 150–400)
RBC: 2.57 MIL/uL — ABNORMAL LOW (ref 3.87–5.11)
RDW: 17.2 % — ABNORMAL HIGH (ref 11.5–15.5)
WBC: 6.5 10*3/uL (ref 4.0–10.5)
nRBC: 0 % (ref 0.0–0.2)

## 2019-05-14 MED ORDER — EPOETIN ALFA 10000 UNIT/ML IJ SOLN
10000.0000 [IU] | Freq: Once | INTRAMUSCULAR | Status: AC
  Administered 2019-05-14: 10000 [IU] via SUBCUTANEOUS
  Filled 2019-05-14: qty 1

## 2019-05-14 MED ORDER — SODIUM CHLORIDE 0.9 % IV SOLN
500.0000 mg | INTRAVENOUS | Status: DC
  Administered 2019-05-14 – 2019-06-01 (×10): 500 mg via INTRAVENOUS
  Filled 2019-05-14 (×11): qty 10

## 2019-05-14 MED ORDER — MIDODRINE HCL 5 MG PO TABS
5.0000 mg | ORAL_TABLET | Freq: Three times a day (TID) | ORAL | Status: DC
  Administered 2019-05-14 – 2019-05-15 (×3): 5 mg via ORAL
  Filled 2019-05-14 (×4): qty 1

## 2019-05-14 NOTE — Progress Notes (Signed)
Hemodialysis patient known at [facility] MWF 12:30pm. Please contact me with any dialysis placement concerns.

## 2019-05-14 NOTE — Progress Notes (Signed)
CRITICAL CARE NOTE  ADMITTED FOR SEPTIC SHOCK AND MRSA BACTEREMIA  CC  follow up septic shock  SUBJECTIVE On pressors More awake Family at bedside updated yesterday    BP 97/62   Pulse 65   Temp 99.3 F (37.4 C) (Oral)   Resp 20   Ht 5\' 2"  (1.575 m)   Wt 66.2 kg   SpO2 98%   BMI 26.69 kg/m    I/O last 3 completed shifts: In: 297.2 [I.V.:175.8; IV Piggyback:121.5] Out: 0  No intake/output data recorded.  SpO2: 98 % O2 Flow Rate (L/min): 1 L/min FiO2 (%): (!) 2 %  Estimated body mass index is 26.69 kg/m as calculated from the following:   Height as of this encounter: 5\' 2"  (1.575 m).   Weight as of this encounter: 66.2 kg.  SIGNIFICANT EVENTS   REVIEW OF SYSTEMS LIMITED DUE TO CVA    Pressure Injury 05/14/2019 Heel Left;Medial Unstageable - Full thickness tissue loss in which the base of the injury is covered by slough (yellow, tan, gray, green or brown) and/or eschar (tan, brown or black) in the wound bed. unstagable with eshcar on le (Active)  05/16/2019 1830  Location: Heel  Location Orientation: Left;Medial  Staging: Unstageable - Full thickness tissue loss in which the base of the injury is covered by slough (yellow, tan, gray, green or brown) and/or eschar (tan, brown or black) in the wound bed.  Wound Description (Comments): unstagable with eshcar on left heel  Present on Admission: Yes     Pressure Injury 05/09/2019 Toe (Comment  which one) Left;Medial Deep Tissue Pressure Injury - Purple or maroon localized area of discolored intact skin or blood-filled blister due to damage of underlying soft tissue from pressure and/or shear. deep tissure (Active)  06/06/2019 1830  Location: Toe (Comment  which one) (left big toe)  Location Orientation: Left;Medial  Staging: Deep Tissue Pressure Injury - Purple or maroon localized area of discolored intact skin or blood-filled blister due to damage of underlying soft tissue from pressure and/or shear.  Wound Description  (Comments): deep tissure injury on left big toe  Present on Admission: Yes      PHYSICAL EXAMINATION:  GENERAL:critically ill appearing,  HEAD: Normocephalic, atraumatic.  EYES: Pupils equal, round, reactive to light.  No scleral icterus.  MOUTH: Moist mucosal membrane. NECK: Supple.  PULMONARY: +rhonchi CARDIOVASCULAR: S1 and S2. Regular rate and rhythm. No murmurs, rubs, or gallops.  GASTROINTESTINAL: Soft, nontender, -distended.  Positive bowel sounds.   MUSCULOSKELETAL: No swelling, clubbing, or edema.  NEUROLOGIC: awake, left sided plegia SKIN:intact,warm,dry  MEDICATIONS: I have reviewed all medications and confirmed regimen as documented   CULTURE RESULTS   Recent Results (from the past 240 hour(s))  Blood culture (routine x 2)     Status: Abnormal   Collection Time: 05/25/2019 12:34 PM   Specimen: BLOOD  Result Value Ref Range Status   Specimen Description   Final    BLOOD R UP ARM Performed at [facility], [address] [place], [place] [address]    Special Requests   Final    BOTTLES DRAWN AEROBIC AND ANAEROBIC Blood Culture adequate volume Performed at [facility], [address] [place], [place] [address]    Culture  Setup Time   Final    Organism ID to follow IN BOTH AEROBIC AND ANAEROBIC BOTTLES GRAM POSITIVE COCCI CRITICAL RESULT CALLED TO, READ BACK BY AND VERIFIED WITH: [place] ON 05/11/19 AT 0210 [facility] Performed at [facility],  [place], [place] [address]    Culture (A)  Final    STAPHYLOCOCCUS AUREUS SUSCEPTIBILITIES PERFORMED ON PREVIOUS CULTURE WITHIN THE LAST 5 DAYS. Performed at [facility], [place] [address], [place], [place] [address]    Report Status 05/13/2019 FINAL  Final  Blood Culture ID Panel (Reflexed)     Status: Abnormal   Collection Time: 05/11/2019 12:34 PM  Result Value Ref Range Status   Enterococcus species NOT DETECTED NOT DETECTED Final   Listeria monocytogenes NOT  DETECTED NOT DETECTED Final   Staphylococcus species DETECTED (A) NOT DETECTED Final    Comment: CRITICAL RESULT CALLED TO, READ BACK BY AND VERIFIED WITH: SCOTT HALL ON 05/11/19 AT 0210 [facility]    Staphylococcus aureus (BCID) DETECTED (A) NOT DETECTED Final    Comment: Methicillin (oxacillin)-resistant Staphylococcus aureus (MRSA). MRSA is predictably resistant to beta-lactam antibiotics (except ceftaroline). Preferred therapy is vancomycin unless clinically contraindicated. Patient requires contact precautions if  hospitalized. CRITICAL RESULT CALLED TO, READ BACK BY AND VERIFIED WITH: SCOTT HALL ON 05/11/19 AT 0210 [facility]    Methicillin resistance DETECTED (A) NOT DETECTED Final    Comment: CRITICAL RESULT CALLED TO, READ BACK BY AND VERIFIED WITH: SCOTT HALL ON 05/11/19 AT 0210 [place]    Streptococcus species NOT DETECTED NOT DETECTED Final   Streptococcus agalactiae NOT DETECTED NOT DETECTED Final   Streptococcus pneumoniae NOT DETECTED NOT DETECTED Final   Streptococcus pyogenes NOT DETECTED NOT DETECTED Final   Acinetobacter baumannii NOT DETECTED NOT DETECTED Final   Enterobacteriaceae species NOT DETECTED NOT DETECTED Final   Enterobacter cloacae complex NOT DETECTED NOT DETECTED Final   Escherichia coli NOT DETECTED NOT DETECTED Final   Klebsiella oxytoca NOT DETECTED NOT DETECTED Final   Klebsiella pneumoniae NOT DETECTED NOT DETECTED Final   Proteus species NOT DETECTED NOT DETECTED Final   Serratia marcescens NOT DETECTED NOT DETECTED Final   Haemophilus influenzae NOT DETECTED NOT DETECTED Final   Neisseria meningitidis NOT DETECTED NOT DETECTED Final   Pseudomonas aeruginosa NOT DETECTED NOT DETECTED Final   Candida albicans NOT DETECTED NOT DETECTED Final   Candida glabrata NOT DETECTED NOT DETECTED Final   Candida krusei NOT DETECTED NOT DETECTED Final   Candida parapsilosis NOT DETECTED NOT DETECTED Final   Candida tropicalis NOT DETECTED NOT DETECTED Final    Comment: Performed  at [facility], [address] [place], [place] [address]  Blood culture (routine x 2)     Status: Abnormal   Collection Time: 05/25/2019  1:24 PM   Specimen: BLOOD  Result Value Ref Range Status   Specimen Description   Final    BLOOD R LATREAL BICEP Performed at [facility], [address] [place], [place] [address]    Special Requests   Final    BOTTLES DRAWN AEROBIC AND ANAEROBIC Blood Culture adequate volume Performed at [facility], [address] [place], [place] [address]    Culture  Setup Time   Final    IN BOTH AEROBIC AND ANAEROBIC BOTTLES GRAM POSITIVE COCCI CRITICAL VALUE NOTED.  VALUE IS CONSISTENT WITH PREVIOUSLY REPORTED AND CALLED VALUE. Performed at [facility], [address] [place], [place] [address]    Culture METHICILLIN RESISTANT STAPHYLOCOCCUS AUREUS (A)  Final   Report Status 05/13/2019 FINAL  Final   Organism ID, Bacteria METHICILLIN RESISTANT STAPHYLOCOCCUS AUREUS  Final      Susceptibility   Methicillin resistant staphylococcus aureus - MIC*    CIPROFLOXACIN >=8 RESISTANT  Resistant     ERYTHROMYCIN >=8 RESISTANT Resistant     GENTAMICIN <=0.5 SENSITIVE Sensitive     OXACILLIN >=4 RESISTANT Resistant     TETRACYCLINE <=1 SENSITIVE Sensitive     VANCOMYCIN <=0.5 SENSITIVE Sensitive     TRIMETH/SULFA >=320 RESISTANT Resistant     CLINDAMYCIN <=0.25 SENSITIVE Sensitive     RIFAMPIN <=0.5 SENSITIVE Sensitive     Inducible Clindamycin NEGATIVE Sensitive     * METHICILLIN RESISTANT STAPHYLOCOCCUS AUREUS  Respiratory Panel by RT PCR (Flu A&B, Covid) - Nasopharyngeal Swab     Status: None   Collection Time: 06/02/2019  1:24 PM   Specimen: Nasopharyngeal Swab  Result Value Ref Range Status   SARS Coronavirus 2 by RT PCR NEGATIVE NEGATIVE Final    Comment: (NOTE) SARS-CoV-2 target nucleic acids are NOT DETECTED. The SARS-CoV-2 RNA is generally detectable in upper respiratoy specimens during the acute phase of  infection. The lowest concentration of SARS-CoV-2 viral copies this assay can detect is 131 copies/mL. A negative result does not preclude SARS-Cov-2 infection and should not be used as the sole basis for treatment or other patient management decisions. A negative result may occur with  improper specimen collection/handling, submission of specimen other than nasopharyngeal swab, presence of viral mutation(s) within the areas targeted by this assay, and inadequate number of viral copies (<131 copies/mL). A negative result must be combined with clinical observations, patient history, and epidemiological information. The expected result is Negative. Fact Sheet for Patients:  PinkCheek.be Fact Sheet for Healthcare Providers:  GravelBags.it This test is not yet ap proved or cleared by the [place] FDA and  has been authorized for detection and/or diagnosis of SARS-CoV-2 by FDA under an Emergency Use Authorization (EUA). This EUA will remain  in effect (meaning this test can be used) for the duration of the COVID-19 declaration under Section 564(b)(1) of the Act, 21 U.S.C. section 360bbb-3(b)(1), unless the authorization is terminated or revoked sooner.    Influenza A by PCR NEGATIVE NEGATIVE Final   Influenza B by PCR NEGATIVE NEGATIVE Final    Comment: (NOTE) The Xpert Xpress SARS-CoV-2/FLU/RSV assay is intended as an aid in  the diagnosis of influenza from Nasopharyngeal swab specimens and  should not be used as a sole basis for treatment. Nasal washings and  aspirates are unacceptable for Xpert Xpress SARS-CoV-2/FLU/RSV  testing. Fact Sheet for Patients: PinkCheek.be Fact Sheet for Healthcare Providers: GravelBags.it This test is not yet approved or cleared by the [place] FDA and  has been authorized for detection and/or diagnosis of SARS-CoV-2 by  FDA under  an Emergency Use Authorization (EUA). This EUA will remain  in effect (meaning this test can be used) for the duration of the  Covid-19 declaration under Section 564(b)(1) of the Act, 21  U.S.C. section 360bbb-3(b)(1), unless the authorization is  terminated or revoked. Performed at [facility], [address] [place], [place] [address]   MRSA PCR Screening     Status: None   Collection Time: 05/26/2019  6:27 PM   Specimen: Nasopharyngeal  Result Value Ref Range Status   MRSA by PCR NEGATIVE NEGATIVE Final    Comment:        The GeneXpert MRSA Assay (FDA approved for NASAL specimens only), is one component of a comprehensive MRSA colonization surveillance program. It is not intended to diagnose MRSA infection nor to guide or monitor treatment for MRSA infections. Performed at [facility], [address] [place],  [address]  Urine culture     Status: Abnormal   Collection Time: 05/09/2019 10:30 PM   Specimen: Urine, Clean Catch  Result Value Ref Range Status   Specimen Description   Final    URINE, CLEAN CATCH Performed at [facility], [address] [place], [place] [address]    Special Requests   Final    NONE Performed at [facility], [place], [place] [address]    Culture (A)  Final    >=100,000 COLONIES/mL KLEBSIELLA PNEUMONIAE >=100,000 COLONIES/mL VANCOMYCIN RESISTANT ENTEROCOCCUS    Report Status 05/14/2019 FINAL  Final   Organism ID, Bacteria KLEBSIELLA PNEUMONIAE (A)  Final   Organism ID, Bacteria VANCOMYCIN RESISTANT ENTEROCOCCUS (A)  Final      Susceptibility   Klebsiella pneumoniae - MIC*    AMPICILLIN >=32 RESISTANT Resistant     CEFAZOLIN <=4 SENSITIVE Sensitive     CEFTRIAXONE <=0.25 SENSITIVE Sensitive     CIPROFLOXACIN <=0.25 SENSITIVE Sensitive     GENTAMICIN <=1 SENSITIVE Sensitive     IMIPENEM 0.5 SENSITIVE Sensitive     NITROFURANTOIN 64 INTERMEDIATE Intermediate      TRIMETH/SULFA <=20 SENSITIVE Sensitive     AMPICILLIN/SULBACTAM 4 SENSITIVE Sensitive     PIP/TAZO <=4 SENSITIVE Sensitive     * >=100,000 COLONIES/mL KLEBSIELLA PNEUMONIAE   Vancomycin resistant enterococcus - MIC*    AMPICILLIN >=32 RESISTANT Resistant     NITROFURANTOIN 256 RESISTANT Resistant     VANCOMYCIN >=32 RESISTANT Resistant     LINEZOLID 2 SENSITIVE Sensitive     * >=100,000 COLONIES/mL VANCOMYCIN RESISTANT ENTEROCOCCUS  Cath Tip Culture     Status: Abnormal   Collection Time: 05/11/19  1:10 PM   Specimen: Catheter Tip; Other  Result Value Ref Range Status   Specimen Description   Final    CATH TIP Performed at [facility], [address] [place], [place] [address]    Special Requests   Final    NONE Performed at [facility], [address] [place], [place] [address]    Culture (A)  Final    >=100,000 COLONIES/mL METHICILLIN RESISTANT STAPHYLOCOCCUS AUREUS   Report Status 05/13/2019 FINAL  Final   Organism ID, Bacteria METHICILLIN RESISTANT STAPHYLOCOCCUS AUREUS (A)  Final      Susceptibility   Methicillin resistant staphylococcus aureus - MIC*    CIPROFLOXACIN >=8 RESISTANT Resistant     ERYTHROMYCIN >=8 RESISTANT Resistant     GENTAMICIN <=0.5 SENSITIVE Sensitive     OXACILLIN >=4 RESISTANT Resistant     TETRACYCLINE <=1 SENSITIVE Sensitive     VANCOMYCIN 1 SENSITIVE Sensitive     TRIMETH/SULFA >=320 RESISTANT Resistant     CLINDAMYCIN <=0.25 SENSITIVE Sensitive     RIFAMPIN <=0.5 SENSITIVE Sensitive     Inducible Clindamycin NEGATIVE Sensitive     * >=100,000 COLONIES/mL METHICILLIN RESISTANT STAPHYLOCOCCUS AUREUS  Culture, blood (routine x 2)     Status: None (Preliminary result)   Collection Time: 05/11/19  4:27 PM   Specimen: BLOOD  Result Value Ref Range Status   Specimen Description BLOOD A-LINE  Final   Special Requests   Final    BOTTLES DRAWN AEROBIC AND ANAEROBIC Blood Culture adequate volume   Culture  Setup Time   Final     GRAM POSITIVE COCCI ANAEROBIC BOTTLE ONLY CRITICAL VALUE NOTED.  VALUE IS CONSISTENT WITH PREVIOUSLY REPORTED AND CALLED VALUE. Performed at [facility], [address], [place], [place] [address]    Culture [place]  POSITIVE COCCI  Final   Report Status PENDING  Incomplete  Culture, blood (routine x 2)     Status: None (Preliminary result)   Collection Time: 05/12/19  6:06 AM   Specimen: BLOOD  Result Value Ref Range Status   Specimen Description BLOOD BLOOD RIGHT HAND  Final   Special Requests   Final    BOTTLES DRAWN AEROBIC AND ANAEROBIC Blood Culture adequate volume   Culture  Setup Time   Final    GRAM POSITIVE COCCI ANAEROBIC BOTTLE ONLY CRITICAL RESULT CALLED TO, READ BACK BY AND VERIFIED WITH: [place] ON 05/12/2019 BY MOSLEY,J Performed at [facility], [address], [place], [place] [address]    Culture GRAM POSITIVE COCCI  Final   Report Status PENDING  Incomplete  CULTURE, BLOOD (ROUTINE X 2) w Reflex to ID Panel     Status: None (Preliminary result)   Collection Time: 05/13/19  2:20 PM   Specimen: BLOOD  Result Value Ref Range Status   Specimen Description BLOOD BLOOD RIGHT HAND  Final   Special Requests   Final    BOTTLES DRAWN AEROBIC AND ANAEROBIC Blood Culture adequate volume   Culture  Setup Time   Final    GRAM POSITIVE COCCI ANAEROBIC BOTTLE ONLY CRITICAL RESULT CALLED TO, READ BACK BY AND VERIFIED WITH: CHRISTINE KATSOUDAS AT 9798 05/14/19 [place] Performed at [facility], [address] [place], [place] [address]    Culture GRAM POSITIVE COCCI  Final   Report Status PENDING  Incomplete  CULTURE, BLOOD (ROUTINE X 2) w Reflex to ID Panel     Status: None (Preliminary result)   Collection Time: 05/13/19  3:39 PM   Specimen: BLOOD  Result Value Ref Range Status   Specimen Description BLOOD BLOOD LEFT HAND  Final   Special Requests   Final    BOTTLES DRAWN AEROBIC ONLY Blood Culture adequate volume   Culture    Final    NO GROWTH < 24 HOURS Performed at [facility], [address] [place], [place] [address]    Report Status PENDING  Incomplete           Central Line/ continued, requirement due to  Reason to continue [place] of central venous pressure or other hemodynamic parameters and poor IV access       ASSESSMENT AND PLAN SYNOPSIS  SEPTIC Shock MRSA bacteremia Low oncotic pressure due to hypoalbuminemia Wean pressors as tolerated   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 intake due to chronic issues with dysphagia Query sideropenic dysphagia Recommend Palliative care consultation  Severe deconditioning Dense left hemiparesis   CARDIAC ICU monitoring  ENDO - will use ICU hypoglycemic\Hyperglycemia protocol if indicated   DIET--> as tolerated Constipation protocol as indicated  ENDO - will use ICU hypoglycemic\Hyperglycemia protocol if indicated   ELECTROLYTES -follow labs as needed -replace as needed -pharmacy consultation and following   DVT/GI PRX ordered TRANSFUSIONS AS NEEDED MONITOR FSBS ASSESS the need for LABS as needed  POOR PROGNOSIS RECOMMEND DNR/DNI STATUS   Corrin Parker, M.D.  [facility]  Medical Director [place] Director [place] Department

## 2019-05-14 NOTE — Progress Notes (Signed)
ID Pt awake but quiet Says she is okay As per her daughter she ate icecream  O/e BP 98/76 (BP Location: Left Arm)   Pulse 78   Temp 98.8 F (37.1 C) (Axillary)   Resp (!) 26   Ht 5\' 2"  (1.575 m)   Wt 66.2 kg   SpO2 96%   BMI 26.69 kg/m    Chest b/l air entry Left femoral catheter Hs s1s2 abd soft Left hemiplegia Left facial palsy   Labs CBC Latest Ref Rng & Units 05/14/2019 05/13/2019 05/12/2019  WBC 4.0 - 10.5 K/uL 6.5 5.1 9.9  Hemoglobin 12.0 - 15.0 g/dL 8.3(L) 7.8(L) 9.5(L)  Hematocrit 36.0 - 46.0 % 22.2(L) 23.6(L) 27.6(L)  Platelets 150 - 400 K/uL 59(L) 38(L) 80(L)    CMP Latest Ref Rng & Units 05/14/2019 05/13/2019 05/12/2019  Glucose 70 - 99 mg/dL 123(H) 140(H) 157(H)  BUN 8 - 23 mg/dL 44(H) 38(H) 30(H)  Creatinine 0.44 - 1.00 mg/dL 3.54(H) 3.01(H) 2.47(H)  Sodium 135 - 145 mmol/L 143 138 135  Potassium 3.5 - 5.1 mmol/L 4.3 4.1 4.2  Chloride 98 - 111 mmol/L 112(H) 105 103  CO2 22 - 32 mmol/L 16(L) 16(L) 21(L)  Calcium 8.9 - 10.3 mg/dL 9.4 9.1 8.5(L)  Total Protein 6.5 - 8.1 g/dL - - -  Total Bilirubin 0.3 - 1.2 mg/dL - - -  Alkaline Phos 38 - 126 U/L - - -  AST 15 - 41 U/L - - -  ALT 0 - 44 U/L - - -    Micro 4/3 BC-MRSA 4/4 cath tip MRSA 4/5 BC and 4/6 BC - positive as well  Impression/Recommendation Persistent  MRSA bacteremia ( 4/3-4/6) in a ptient with ESRD with infected HD catheter which has been remove don 4/4- No cardiac device o implants Currently on vanco ( MIC 1) will add daptomycin Will remove left femoral catheter which has clotted  She will need TEE but not sure whether that can be done with her dysphagia and prior esophageal dilatation for schatzki's ring   Cephalosporin allergy mentioned in the chart- family not aware. She may need ceftaroline   Discussed with family and pharmacist

## 2019-05-14 NOTE — Progress Notes (Signed)
[place] visited pt. while rounding on ICU; pt.'s dtr Robin @ bedside.  Dtr. shared she has been praying and fasting for pt.'s recovery and healing, finding particular comfort in Psalm 91; pt. lives in nursing home, dtr. said.  Pt. was brought to the hospital after her dialysis port became infected with staph.  Pt. and dtr. requested prayer for peace and for healing; both grateful for [facility] visit.  [place] recommends follow up if possible.    05/14/19 1500  Clinical Encounter Type  Visited With Patient  Visit Type Initial;Psychological support;Spiritual support;Social support;Critical Care  Referral From  (Routine Rounding)  Spiritual Encounters  Spiritual Needs Emotional;Prayer  Stress Factors  Patient Stress Factors Health changes;Loss of control;Major life changes  Family Stress Factors Health changes;Loss of control;Major life changes

## 2019-05-14 NOTE — Progress Notes (Signed)
[facility]  ROUNDING NOTE   Subjective:   Patient eating with assistance of nurse tech.   Weaned off vasopressin. Norepinephrine 73mcg  Able to answer simple commands   Objective:  Vital signs in last 24 hours:  Temp:  [97.9 F (36.6 C)-99.3 F (37.4 C)] 98.7 F (37.1 C) (04/07 0800) Pulse Rate:  [32-71] 65 (04/07 0900) Resp:  [19-30] 20 (04/07 0900) BP: (86-121)/(24-79) 121/74 (04/07 0900) SpO2:  [94 %-100 %] 100 % (04/07 0900) Weight:  [66.2 kg] 66.2 kg (04/07 0500)  Weight change: -3.3 kg Filed Weights   05/12/19 0500 05/13/19 0500 05/14/19 0500  Weight: 66.8 kg 69.5 kg 66.2 kg    Intake/Output: I/O last 3 completed shifts: In: 323.9 [I.V.:202.4; IV Piggyback:121.5] Out: 0    Intake/Output this shift:  No intake/output data recorded.  Physical Exam: General: ill  Head: Normocephalic, atraumatic.   Eyes: Anicteric, PERRL  Neck: Supple, trachea midline  Lungs:  +wheezing  Heart: Regular rate and rhythm  Abdomen:  Soft, nontender   Extremities:  no peripheral edema.  Neurologic: Left sided weakness. Moving her extremities. Alert to self and place  Skin: No lesions  Access: none    Basic Metabolic Panel: Recent Labs  Lab 05/30/2019 1324 05/31/2019 1324 05/22/2019 1625 05/18/2019 2135 05/18/2019 2135 05/11/19 0611 05/11/19 1627 05/12/19 0502 05/13/19 0556  NA 139  --   --  136  --  134*  --  135 138  K 2.1*   < >  --  3.5  --  3.6 3.3* 4.2 4.1  CL 107  --   --  103  --  100  --  103 105  CO2 20*  --   --  15*  --  15*  --  21* 16*  GLUCOSE 137*  --   --  202*  --  261*  --  157* 140*  BUN 20  --   --  25*  --  28*  --  30* 38*  CREATININE 1.68*  --   --  2.04*  --  2.13*  --  2.47* 3.01*  CALCIUM 6.7*   < >  --  7.9*   < > 7.8*  --  8.5* 9.1  MG  --   --  1.6*  --   --  2.3  --  2.0  --   PHOS  --   --   --  1.6*  --  4.6  --  3.6  --    < > = values in this interval not displayed.    Liver Function Tests: Recent Labs  Lab  05/14/2019 1324  AST 26  ALT 14  ALKPHOS 55  BILITOT 1.5*  PROT 3.9*  ALBUMIN 1.3*   Recent Labs  Lab 05/14/2019 1324  LIPASE <10*   Recent Labs  Lab 06/01/2019 1234  AMMONIA 13    CBC: Recent Labs  Lab 06/05/2019 1324 05/11/19 0611 05/11/19 1627 05/12/19 0607 05/13/19 0556  WBC 9.4 8.5  --  9.9 5.1  NEUTROABS 7.5  --   --   --   --   HGB 6.9* 7.0* 8.0* 9.5* 7.8*  HCT 21.4* 22.2* 23.9* 27.6* 23.6*  MCV 103.4* 106.2*  --  96.2 96.3  PLT 82* 79*  --  80* 38*    Cardiac Enzymes: Recent Labs  Lab 05/22/2019 1324  CKTOTAL 17*    BNP: Invalid input(s): POCBNP  CBG: Recent Labs  Lab 05/13/19 1559 05/13/19 2007 05/14/19  0026 05/14/19 0411 05/14/19 0717  GLUCAP 124* 137* 104* 117* 144*    Microbiology: Results for orders placed or performed during the hospital encounter of 05/11/2019  Blood culture (routine x 2)     Status: Abnormal   Collection Time: 05/30/2019 12:34 PM   Specimen: BLOOD  Result Value Ref Range Status   Specimen Description   Final    BLOOD R UP ARM Performed at [facility], [address], [place], [place] [address]    Special Requests   Final    BOTTLES DRAWN AEROBIC AND ANAEROBIC Blood Culture adequate volume Performed at [facility], [address] [place], [place] [address]    Culture  Setup Time   Final    Organism ID to follow IN BOTH AEROBIC AND ANAEROBIC BOTTLES GRAM POSITIVE COCCI CRITICAL RESULT CALLED TO, READ BACK BY AND VERIFIED WITH: [place] ON 05/11/19 AT 0210 [facility] Performed at [facility], [address] [place], [place] [address]    Culture (A)  Final    STAPHYLOCOCCUS AUREUS SUSCEPTIBILITIES PERFORMED ON PREVIOUS CULTURE WITHIN THE LAST 5 DAYS. Performed at [facility], [place] [address], [place], [place] [address]    Report Status 05/13/2019 FINAL  Final  Blood Culture ID Panel (Reflexed)     Status: Abnormal   Collection Time: 06/05/2019 12:34 PM  Result Value Ref Range Status    Enterococcus species NOT DETECTED NOT DETECTED Final   Listeria monocytogenes NOT DETECTED NOT DETECTED Final   Staphylococcus species DETECTED (A) NOT DETECTED Final    Comment: CRITICAL RESULT CALLED TO, READ BACK BY AND VERIFIED WITH: SCOTT HALL ON 05/11/19 AT 0210 [facility]    Staphylococcus aureus (BCID) DETECTED (A) NOT DETECTED Final    Comment: Methicillin (oxacillin)-resistant Staphylococcus aureus (MRSA). MRSA is predictably resistant to beta-lactam antibiotics (except ceftaroline). Preferred therapy is vancomycin unless clinically contraindicated. Patient requires contact precautions if  hospitalized. CRITICAL RESULT CALLED TO, READ BACK BY AND VERIFIED WITH: SCOTT HALL ON 05/11/19 AT 0210 [facility]    Methicillin resistance DETECTED (A) NOT DETECTED Final    Comment: CRITICAL RESULT CALLED TO, READ BACK BY AND VERIFIED WITH: SCOTT HALL ON 05/11/19 AT 0210 [facility]    Streptococcus species NOT DETECTED NOT DETECTED Final   Streptococcus agalactiae NOT DETECTED NOT DETECTED Final   Streptococcus pneumoniae NOT DETECTED NOT DETECTED Final   Streptococcus pyogenes NOT DETECTED NOT DETECTED Final   Acinetobacter baumannii NOT DETECTED NOT DETECTED Final   Enterobacteriaceae species NOT DETECTED NOT DETECTED Final   Enterobacter cloacae complex NOT DETECTED NOT DETECTED Final   Escherichia coli NOT DETECTED NOT DETECTED Final   Klebsiella oxytoca NOT DETECTED NOT DETECTED Final   Klebsiella pneumoniae NOT DETECTED NOT DETECTED Final   Proteus species NOT DETECTED NOT DETECTED Final   Serratia marcescens NOT DETECTED NOT DETECTED Final   Haemophilus influenzae NOT DETECTED NOT DETECTED Final   Neisseria meningitidis NOT DETECTED NOT DETECTED Final   Pseudomonas aeruginosa NOT DETECTED NOT DETECTED Final   Candida albicans NOT DETECTED NOT DETECTED Final   Candida glabrata NOT DETECTED NOT DETECTED Final   Candida krusei NOT DETECTED NOT DETECTED Final   Candida parapsilosis NOT DETECTED NOT DETECTED  Final   Candida tropicalis NOT DETECTED NOT DETECTED Final    Comment: Performed at [facility], [address] [place], [place] [address]  Blood culture (routine x 2)     Status: Abnormal   Collection Time: 05/17/2019  1:24 PM  Specimen: BLOOD  Result Value Ref Range Status   Specimen Description   Final    BLOOD R LATREAL BICEP Performed at [facility], [address], [place], [place] [address]    Special Requests   Final    BOTTLES DRAWN AEROBIC AND ANAEROBIC Blood Culture adequate volume Performed at [facility], [address] [place], [place] [address]    Culture  Setup Time   Final    IN BOTH AEROBIC AND ANAEROBIC BOTTLES GRAM POSITIVE COCCI CRITICAL VALUE NOTED.  VALUE IS CONSISTENT WITH PREVIOUSLY REPORTED AND CALLED VALUE. Performed at [facility], [address] [place], [place] [address]    Culture METHICILLIN RESISTANT STAPHYLOCOCCUS AUREUS (A)  Final   Report Status 05/13/2019 FINAL  Final   Organism ID, Bacteria METHICILLIN RESISTANT STAPHYLOCOCCUS AUREUS  Final      Susceptibility   Methicillin resistant staphylococcus aureus - MIC*    CIPROFLOXACIN >=8 RESISTANT Resistant     ERYTHROMYCIN >=8 RESISTANT Resistant     GENTAMICIN <=0.5 SENSITIVE Sensitive     OXACILLIN >=4 RESISTANT Resistant     TETRACYCLINE <=1 SENSITIVE Sensitive     VANCOMYCIN <=0.5 SENSITIVE Sensitive     TRIMETH/SULFA >=320 RESISTANT Resistant     CLINDAMYCIN <=0.25 SENSITIVE Sensitive     RIFAMPIN <=0.5 SENSITIVE Sensitive     Inducible Clindamycin NEGATIVE Sensitive     * METHICILLIN RESISTANT STAPHYLOCOCCUS AUREUS  Respiratory Panel by RT PCR (Flu A&B, Covid) - Nasopharyngeal Swab     Status: None   Collection Time: 06/06/2019  1:24 PM   Specimen: Nasopharyngeal Swab  Result Value Ref Range Status   SARS Coronavirus 2 by RT PCR NEGATIVE NEGATIVE Final    Comment: (NOTE) SARS-CoV-2 target nucleic acids are NOT DETECTED. The SARS-CoV-2 RNA  is generally detectable in upper respiratoy specimens during the acute phase of infection. The lowest concentration of SARS-CoV-2 viral copies this assay can detect is 131 copies/mL. A negative result does not preclude SARS-Cov-2 infection and should not be used as the sole basis for treatment or other patient management decisions. A negative result may occur with  improper specimen collection/handling, submission of specimen other than nasopharyngeal swab, presence of viral mutation(s) within the areas targeted by this assay, and inadequate number of viral copies (<131 copies/mL). A negative result must be combined with clinical observations, patient history, and epidemiological information. The expected result is Negative. Fact Sheet for Patients:  PinkCheek.be Fact Sheet for Healthcare Providers:  GravelBags.it This test is not yet ap proved or cleared by the [place] FDA and  has been authorized for detection and/or diagnosis of SARS-CoV-2 by FDA under an Emergency Use Authorization (EUA). This EUA will remain  in effect (meaning this test can be used) for the duration of the COVID-19 declaration under Section 564(b)(1) of the Act, 21 U.S.C. section 360bbb-3(b)(1), unless the authorization is terminated or revoked sooner.    Influenza A by PCR NEGATIVE NEGATIVE Final   Influenza B by PCR NEGATIVE NEGATIVE Final    Comment: (NOTE) The Xpert Xpress SARS-CoV-2/FLU/RSV assay is intended as an aid in  the diagnosis of influenza from Nasopharyngeal swab specimens and  should not be used as a sole basis for treatment. Nasal washings and  aspirates are unacceptable for Xpert Xpress SARS-CoV-2/FLU/RSV  testing. Fact Sheet for Patients: PinkCheek.be Fact Sheet for Healthcare Providers: GravelBags.it This test is not yet approved or cleared by the [place] FDA and   has been authorized for detection  and/or diagnosis of SARS-CoV-2 by  FDA under an Emergency Use Authorization (EUA). This EUA will remain  in effect (meaning this test can be used) for the duration of the  Covid-19 declaration under Section 564(b)(1) of the Act, 21  U.S.C. section 360bbb-3(b)(1), unless the authorization is  terminated or revoked. Performed at [facility], [address] [place], [place] [address]   MRSA PCR Screening     Status: None   Collection Time: 05/30/2019  6:27 PM   Specimen: Nasopharyngeal  Result Value Ref Range Status   MRSA by PCR NEGATIVE NEGATIVE Final    Comment:        The GeneXpert MRSA Assay (FDA approved for NASAL specimens only), is one component of a comprehensive MRSA colonization surveillance program. It is not intended to diagnose MRSA infection nor to guide or monitor treatment for MRSA infections. Performed at [facility], [address] [place], [place] [address]   Urine culture     Status: Abnormal   Collection Time: 06/04/2019 10:30 PM   Specimen: Urine, Clean Catch  Result Value Ref Range Status   Specimen Description   Final    URINE, CLEAN CATCH Performed at [facility], [address], [place], [place] [address]    Special Requests   Final    NONE Performed at [facility], [place], [place] [address]    Culture (A)  Final    >=100,000 COLONIES/mL KLEBSIELLA PNEUMONIAE >=100,000 COLONIES/mL VANCOMYCIN RESISTANT ENTEROCOCCUS    Report Status 05/14/2019 FINAL  Final   Organism ID, Bacteria KLEBSIELLA PNEUMONIAE (A)  Final   Organism ID, Bacteria VANCOMYCIN RESISTANT ENTEROCOCCUS (A)  Final      Susceptibility   Klebsiella pneumoniae - MIC*    AMPICILLIN >=32 RESISTANT Resistant     CEFAZOLIN <=4 SENSITIVE Sensitive     CEFTRIAXONE <=0.25 SENSITIVE Sensitive     CIPROFLOXACIN <=0.25 SENSITIVE Sensitive     GENTAMICIN <=1 SENSITIVE Sensitive     IMIPENEM 0.5  SENSITIVE Sensitive     NITROFURANTOIN 64 INTERMEDIATE Intermediate     TRIMETH/SULFA <=20 SENSITIVE Sensitive     AMPICILLIN/SULBACTAM 4 SENSITIVE Sensitive     PIP/TAZO <=4 SENSITIVE Sensitive     * >=100,000 COLONIES/mL KLEBSIELLA PNEUMONIAE   Vancomycin resistant enterococcus - MIC*    AMPICILLIN >=32 RESISTANT Resistant     NITROFURANTOIN 256 RESISTANT Resistant     VANCOMYCIN >=32 RESISTANT Resistant     LINEZOLID 2 SENSITIVE Sensitive     * >=100,000 COLONIES/mL VANCOMYCIN RESISTANT ENTEROCOCCUS  Cath Tip Culture     Status: Abnormal   Collection Time: 05/11/19  1:10 PM   Specimen: Catheter Tip; Other  Result Value Ref Range Status   Specimen Description   Final    CATH TIP Performed at [facility], [address] [place], [place] [address]    Special Requests   Final    NONE Performed at [facility], [address] [place], [place] [address]    Culture (A)  Final    >=100,000 COLONIES/mL METHICILLIN RESISTANT STAPHYLOCOCCUS AUREUS   Report Status 05/13/2019 FINAL  Final   Organism ID, Bacteria METHICILLIN RESISTANT STAPHYLOCOCCUS AUREUS (A)  Final      Susceptibility   Methicillin resistant staphylococcus aureus - MIC*    CIPROFLOXACIN >=8 RESISTANT Resistant     ERYTHROMYCIN >=8 RESISTANT Resistant     GENTAMICIN <=0.5 SENSITIVE Sensitive     OXACILLIN >=4 RESISTANT Resistant  TETRACYCLINE <=1 SENSITIVE Sensitive     VANCOMYCIN 1 SENSITIVE Sensitive     TRIMETH/SULFA >=320 RESISTANT Resistant     CLINDAMYCIN <=0.25 SENSITIVE Sensitive     RIFAMPIN <=0.5 SENSITIVE Sensitive     Inducible Clindamycin NEGATIVE Sensitive     * >=100,000 COLONIES/mL METHICILLIN RESISTANT STAPHYLOCOCCUS AUREUS  Culture, blood (routine x 2)     Status: Abnormal (Preliminary result)   Collection Time: 05/11/19  4:27 PM   Specimen: BLOOD  Result Value Ref Range Status   Specimen Description   Final    BLOOD A-LINE Performed at [facility], [address] [place], [place] [address]    Special Requests   Final    BOTTLES DRAWN AEROBIC AND ANAEROBIC Blood Culture adequate volume Performed at [facility], [address] [place], [place] [address]    Culture  Setup Time   Final    GRAM POSITIVE COCCI ANAEROBIC BOTTLE ONLY CRITICAL VALUE NOTED.  VALUE IS CONSISTENT WITH PREVIOUSLY REPORTED AND CALLED VALUE. Performed at [facility], [address], [place], [place] [address]    Culture STAPHYLOCOCCUS AUREUS (A)  Final   Report Status PENDING  Incomplete  Culture, blood (routine x 2)     Status: Abnormal (Preliminary result)   Collection Time: 05/12/19  6:06 AM   Specimen: BLOOD  Result Value Ref Range Status   Specimen Description   Final    BLOOD BLOOD RIGHT HAND Performed at [facility], [address], [place], [place] [address]    Special Requests   Final    BOTTLES DRAWN AEROBIC AND ANAEROBIC Blood Culture adequate volume Performed at [facility], [address] [place], [place] [address]    Culture  Setup Time   Final    GRAM POSITIVE COCCI ANAEROBIC BOTTLE ONLY CRITICAL RESULT CALLED TO, READ BACK BY AND VERIFIED WITH: [place] ON 05/12/2019 BY MOSLEY,J Performed at [facility], [address], [place], [place] [address]    Culture (A)  Final    STAPHYLOCOCCUS AUREUS SUSCEPTIBILITIES TO FOLLOW Performed at [facility], [place] [address], [place], [place] [address]    Report Status PENDING  Incomplete  CULTURE, BLOOD (ROUTINE X 2) w Reflex to ID Panel     Status: None (Preliminary result)   Collection Time: 05/13/19  2:20 PM   Specimen: BLOOD  Result Value Ref Range Status   Specimen Description BLOOD BLOOD RIGHT HAND  Final   Special Requests   Final    BOTTLES DRAWN AEROBIC AND ANAEROBIC Blood Culture adequate volume   Culture  Setup Time   Final    GRAM POSITIVE COCCI ANAEROBIC BOTTLE ONLY CRITICAL RESULT CALLED TO, READ BACK BY AND  VERIFIED WITHCurt Jews AT 6606 05/14/19 [place] Performed at [facility], [address] [place], [place] [address]    Culture GRAM POSITIVE COCCI  Final   Report Status PENDING  Incomplete  CULTURE, BLOOD (ROUTINE X 2) w Reflex to ID Panel     Status: None (Preliminary result)   Collection Time: 05/13/19  3:39 PM   Specimen: BLOOD  Result Value Ref Range Status   Specimen Description BLOOD BLOOD LEFT HAND  Final   Special Requests   Final    BOTTLES DRAWN AEROBIC ONLY Blood Culture adequate volume   Culture   Final    NO GROWTH < 24 HOURS Performed at [facility], [address], [place],  [address]  Report Status PENDING  Incomplete    Coagulation Studies: No results for input(s): LABPROT, INR in the last 72 hours.  Urinalysis: No results for input(s): COLORURINE, LABSPEC, PHURINE, GLUCOSEU, HGBUR, BILIRUBINUR, KETONESUR, PROTEINUR, UROBILINOGEN, NITRITE, LEUKOCYTESUR in the last 72 hours.  Invalid input(s): APPERANCEUR    Imaging: No results found.   Medications:   . norepinephrine (LEVOPHED) Adult infusion 2 mcg/min (05/14/19 0700)  . vasopressin (PITRESSIN) infusion - *FOR SHOCK* Stopped (05/13/19 0435)   . Chlorhexidine Gluconate Cloth  6 each Topical Daily  . insulin aspart  0-9 Units Subcutaneous Q4H  . pantoprazole (PROTONIX) IV  40 mg Intravenous QHS  . vancomycin variable dose per unstable renal function (pharmacist dosing)   Does not apply See admin instructions     Assessment/ Plan:  [name] is a 74 y.o. black female [name] is a 74 y.o. black female with end stage renal disease on hemodialysis, CVA with left hemiparesis, hypertension, hyperlipidemia, GERD, glaucoma, diabetes mellitus type II, coronary artery disease, gout, seizure disorder who was admitted to [facility] on 05/23/2019 for Hypokalemia [E87.6] Shock ([place]) [R57.9] ESRD (end stage renal disease) ([place]) [N18.6] Hypotension, unspecified hypotension  type [I95.9] Anemia, unspecified type [D64.9]  [facility] MWF RIJ permcath 63.5kg  1. End Stage Renal Disease: last hemodialysis treatment was Friday 4/2. No acute indication for dialysis.  Currently without dialysis catheter.  Plan on dialysis catheter when dialysis is indication.  Pending labwork for today.  Appreciate vascular input  2. Hypotension: with MRSA bacteremia/sepsis requiring vasopressors: Persistent positive blood cultres from 4/5.  Weaning norepinephrine and weaned off vasopressin.   - Continue vancomycin. Appreciate ID input. - Catheter free period   3. Metabolic acidosis - recommend PO replacement.   4. Anemia with chronic kidney disease: with iron deficiency and folate deficiency Hemoglobin 7.8 - EPO subcu today  5. Secondary Hyperparathyroidism: with hypophosphatemia. Off binders.   6. Malnutrition: albumin 1.3. 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  - Checking serum albumin level today.   7. Thrombocytopenia: acute drop most likely due to infection. Platelet count was 247K on 3/22.     LOS: 4 Tylin Force 4/7/20219:21 AM

## 2019-05-14 NOTE — TOC Progression Note (Signed)
Transition of Care [facility]) - Progression Note    Patient Details  Name: Crystal Vargas MRN: [id number] Date of Birth: 09-Jun-1945  Transition of [facility]) CM/SW [place], LCSW Phone Number: 05/14/2019, 1:30 PM  Clinical Narrative:   Call to [facility] with [facility] to see what will be needed when patient does return to the SNF. Audry Pili reported patient would not need insurance auth unless she goes back with rehab. Will just need FL2 and COVID swab. CSW will continue to keep Audry Pili updated when patient is discharged.     Expected Discharge Plan: [place] Barriers to Discharge: Continued Medical Work up  Expected Discharge Plan and Services Expected Discharge Plan: [place] arrangements for the past 2 months: [place]                                       Social Determinants of Health (SDOH) Interventions    Readmission Risk Interventions No flowsheet data found.

## 2019-05-14 NOTE — Consult Note (Addendum)
Pharmacy Antibiotic Note  Crystal Vargas is a 74 y.o. female admitted on 05/25/2019 with MRSA bacteremia.  Pharmacy has been consulted for Vancomycin and Daptomycin.  Patient receives hemodialysis on Monday, Wednesday and Friday.  Per nephrology note, no current indication for hemodialysis.  Plan for removal of CVC (placed 4/3) as patient continues to show bacterial growth in blood.  Plan:  Vancomycin:  Random vanc level planned for the AM. Will continue to monitor for dialysis plan.  Daptomycin: Per discussion with ID, will start daptomycin in the setting of persistent bacteremia.  Will give daptomycin 500 mg (~8 mg/kg) IV q48 hours.  Will obtain CK with AM labs.  Pharmacy will continue to dose and adjust per consult.   Height: 5\' 2"  (157.5 cm) Weight: 66.2 kg (145 lb 15.1 oz) IBW/kg (Calculated) : 50.1  Temp (24hrs), Avg:98.7 F (37.1 C), Min:97.9 F (36.6 C), Max:99.3 F (37.4 C)  Recent Labs  Lab 05/24/2019 1324 05/10/2019 2135 05/11/2019 2306 05/11/19 0611 05/12/19 0502 05/12/19 0607 05/12/19 1506 05/13/19 0556 05/13/19 1420  WBC 9.4  --   --  8.5  --  9.9  --  5.1  --   CREATININE 1.68* 2.04*  --  2.13* 2.47*  --   --  3.01*  --   LATICACIDVEN 1.9  --  1.5 1.4  --   --   --   --   --   VANCOTROUGH  --   --   --   --   --   --   --   --  31*  VANCORANDOM  --   --   --   --   --   --  17  --   --     Estimated Creatinine Clearance: 14.2 mL/min (A) (by C-G formula based on SCr of 3.01 mg/dL (H)).    Allergies  Allergen Reactions  . Abacavir Other (See Comments)  . Cephalosporins Other (See Comments)  . Ciprofloxacin Other (See Comments)  . Nsaids Other (See Comments)  . Penicillin G Other (See Comments)  . Quinolones Other (See Comments)  . Salicylates Other (See Comments)    Antimicrobials this admission: Vancomycin 4/3 >>  Aztreonam 4/3 x 1 Daptomycin 4/7 >>  Microbiology results: 4/6 Bcx: GPC 1/4 (anaerobic bottle) 4/4 Bcx:  Staph aureus (A-line) 4/5 Bcx:  Staph  aureus 4/3 BCx: MRSA 4/4 Cath tip:  MRSA 4/3 UCx: Kleb - Likely contamination  4/3 MRSA PCR: negative 4/3 Flu A&B, Covid (nasopharyngeal swab):  negative  Thank you for allowing pharmacy to be a part of this patient's care.  Gerald Dexter, PharmD 05/14/2019 3:47 PM

## 2019-05-15 ENCOUNTER — Encounter: Admission: EM | Disposition: E | Payer: Self-pay | Source: Home / Self Care | Attending: Internal Medicine

## 2019-05-15 ENCOUNTER — Encounter: Payer: Self-pay | Admitting: Cardiology

## 2019-05-15 DIAGNOSIS — N185 Chronic kidney disease, stage 5: Secondary | ICD-10-CM | POA: Diagnosis not present

## 2019-05-15 HISTORY — PX: TEMPORARY DIALYSIS CATHETER: CATH118312

## 2019-05-15 LAB — BASIC METABOLIC PANEL
Anion gap: 12 (ref 5–15)
BUN: 48 mg/dL — ABNORMAL HIGH (ref 8–23)
CO2: 19 mmol/L — ABNORMAL LOW (ref 22–32)
Calcium: 9.3 mg/dL (ref 8.9–10.3)
Chloride: 110 mmol/L (ref 98–111)
Creatinine, Ser: 3.76 mg/dL — ABNORMAL HIGH (ref 0.44–1.00)
GFR calc Af Amer: 13 mL/min — ABNORMAL LOW (ref >60)
GFR calc non Af Amer: 11 mL/min — ABNORMAL LOW (ref >60)
Glucose, Bld: 117 mg/dL — ABNORMAL HIGH (ref 70–99)
Potassium: 3.7 mmol/L (ref 3.5–5.1)
Sodium: 141 mmol/L (ref 135–145)

## 2019-05-15 LAB — CBC
HCT: 25.5 % — ABNORMAL LOW (ref 36.0–46.0)
Hemoglobin: 8.9 g/dL — ABNORMAL LOW (ref 12.0–15.0)
MCH: 32.6 pg (ref 26.0–34.0)
MCHC: 34.9 g/dL (ref 30.0–36.0)
MCV: 93.4 fL (ref 80.0–100.0)
Platelets: 51 10*3/uL — ABNORMAL LOW (ref 150–400)
RBC: 2.73 MIL/uL — ABNORMAL LOW (ref 3.87–5.11)
RDW: 17.9 % — ABNORMAL HIGH (ref 11.5–15.5)
WBC: 7.7 10*3/uL (ref 4.0–10.5)
nRBC: 0 % (ref 0.0–0.2)

## 2019-05-15 LAB — GLUCOSE, CAPILLARY
Glucose-Capillary: 117 mg/dL — ABNORMAL HIGH (ref 70–99)
Glucose-Capillary: 140 mg/dL — ABNORMAL HIGH (ref 70–99)
Glucose-Capillary: 161 mg/dL — ABNORMAL HIGH (ref 70–99)
Glucose-Capillary: 184 mg/dL — ABNORMAL HIGH (ref 70–99)
Glucose-Capillary: 65 mg/dL — ABNORMAL LOW (ref 70–99)
Glucose-Capillary: 86 mg/dL (ref 70–99)
Glucose-Capillary: 87 mg/dL (ref 70–99)
Glucose-Capillary: 92 mg/dL (ref 70–99)

## 2019-05-15 LAB — CULTURE, BLOOD (ROUTINE X 2): Special Requests: ADEQUATE

## 2019-05-15 LAB — CK: Total CK: 10 U/L — ABNORMAL LOW (ref 38–234)

## 2019-05-15 LAB — VANCOMYCIN, RANDOM: Vancomycin Rm: 25

## 2019-05-15 SURGERY — TEMPORARY DIALYSIS CATHETER

## 2019-05-15 MED ORDER — LIDOCAINE HCL (PF) 1 % IJ SOLN
INTRAMUSCULAR | Status: DC | PRN
  Administered 2019-05-15: 5 mL via INTRADERMAL

## 2019-05-15 MED ORDER — DEXTROSE 50 % IV SOLN
12.5000 g | INTRAVENOUS | Status: AC
  Administered 2019-05-15: 12.5 g via INTRAVENOUS
  Filled 2019-05-15: qty 50

## 2019-05-15 MED ORDER — VANCOMYCIN HCL 750 MG/150ML IV SOLN
750.0000 mg | INTRAVENOUS | Status: AC
  Administered 2019-05-15: 750 mg via INTRAVENOUS
  Filled 2019-05-15 (×2): qty 150

## 2019-05-15 MED ORDER — LEVETIRACETAM IN NACL 500 MG/100ML IV SOLN
500.0000 mg | Freq: Every day | INTRAVENOUS | Status: DC
  Administered 2019-05-15 – 2019-05-21 (×6): 500 mg via INTRAVENOUS
  Filled 2019-05-15 (×9): qty 100

## 2019-05-15 SURGICAL SUPPLY — 1 items: KIT DIALYSIS CATH TRI 30X13 (CATHETERS) ×3 IMPLANT

## 2019-05-15 NOTE — Progress Notes (Signed)
SLP Cancellation Note  Patient Details Name: Crystal Vargas MRN: [id number] DOB: November 25, 1945   Cancelled treatment:       Reason Eval/Treat Not Completed: Patient at procedure or test/unavailable(chart reviewed; consulted NSG. ). Pt just completed procedure w/ Vascular. Will hold on trials to upgrade diet until tomorrow. Recommend continue current diet. NSG agreed.     Orinda Kenner, MS, CCC-SLP Geovani Tootle 05/17/2019, 4:08 PM

## 2019-05-15 NOTE — Progress Notes (Signed)
[facility]  ROUNDING NOTE   Subjective:   Awake and responsive.  Weaned off all vasopressors.    Objective:  Vital signs in last 24 hours:  Temp:  [98 F (36.7 C)-98.8 F (37.1 C)] 98 F (36.7 C) (04/08 0400) Pulse Rate:  [61-78] 63 (04/08 0600) Resp:  [18-34] 20 (04/08 0600) BP: (98-180)/(51-120) 130/71 (04/08 0600) SpO2:  [94 %-100 %] 100 % (04/08 0600) Weight:  [66.3 kg] 66.3 kg (04/08 0500)  Weight change: 0.1 kg Filed Weights   05/13/19 0500 05/14/19 0500 05/25/2019 0500  Weight: 69.5 kg 66.2 kg 66.3 kg    Intake/Output: I/O last 3 completed shifts: In: 268.4 [P.O.:237; I.V.:31.4] Out: 0    Intake/Output this shift:  No intake/output data recorded.  Physical Exam: General: Ill appearing  Head: Normocephalic, atraumatic.   Eyes: Anicteric, PERRL  Neck: Supple, trachea midline  Lungs:  clear  Heart: Regular rate and rhythm  Abdomen:  Soft, nontender   Extremities:  no peripheral edema.  Neurologic: Left sided weakness. Moving her extremities. Alert to self and place  Skin: No lesions  Access: none    Basic Metabolic Panel: Recent Labs  Lab 05/31/2019 1324 05/09/2019 1625 05/23/2019 2135 05/18/2019 2135 05/11/19 0611 05/11/19 0611 05/11/19 1627 05/12/19 0502 05/12/19 0502 05/13/19 0556 05/14/19 1051 05/22/2019 0633  NA   < >  --  136   < > 134*  --   --  135  --  138 143 141  K   < >  --  3.5   < > 3.6   < > 3.3* 4.2  --  4.1 4.3 3.7  CL   < >  --  103   < > 100  --   --  103  --  105 112* 110  CO2   < >  --  15*   < > 15*  --   --  21*  --  16* 16* 19*  GLUCOSE   < >  --  202*   < > 261*  --   --  157*  --  140* 123* 117*  BUN   < >  --  25*   < > 28*  --   --  30*  --  38* 44* 48*  CREATININE   < >  --  2.04*   < > 2.13*  --   --  2.47*  --  3.01* 3.54* 3.76*  CALCIUM   < >  --  7.9*   < > 7.8*   < >  --  8.5*   < > 9.1 9.4 9.3  MG  --  1.6*  --   --  2.3  --   --  2.0  --   --   --   --   PHOS  --   --  1.6*  --  4.6  --   --  3.6  --    --  3.1  --    < > = values in this interval not displayed.    Liver Function Tests: Recent Labs  Lab 06/05/2019 1324 05/14/19 1051  AST 26  --   ALT 14  --   ALKPHOS 55  --   BILITOT 1.5*  --   PROT 3.9*  --   ALBUMIN 1.3* 2.7*   Recent Labs  Lab 05/24/2019 1324  LIPASE <10*   Recent Labs  Lab 05/14/2019 1234  AMMONIA 13    CBC: Recent  Labs  Lab 05/08/2019 1324 05/31/2019 1324 05/11/19 0611 05/11/19 1627 05/12/19 0607 05/13/19 0556 05/14/19 1051  WBC 9.4  --  8.5  --  9.9 5.1 6.5  NEUTROABS 7.5  --   --   --   --   --   --   HGB 6.9*   < > 7.0* 8.0* 9.5* 7.8* 8.3*  HCT 21.4*   < > 22.2* 23.9* 27.6* 23.6* 22.2*  MCV 103.4*  --  106.2*  --  96.2 96.3 86.4  PLT 82*  --  79*  --  80* 38* 59*   < > = values in this interval not displayed.    Cardiac Enzymes: Recent Labs  Lab 05/18/2019 1324 06/02/2019 0633  CKTOTAL 17* 10*    BNP: Invalid input(s): POCBNP  CBG: Recent Labs  Lab 05/14/19 1544 05/14/19 1953 06/06/2019 0001 05/12/2019 0359 05/29/2019 0751  GLUCAP 189* 197* 161* 140* 59    Microbiology: Results for orders placed or performed during the hospital encounter of 05/09/2019  Blood culture (routine x 2)     Status: Abnormal   Collection Time: 06/04/2019 12:34 PM   Specimen: BLOOD  Result Value Ref Range Status   Specimen Description   Final    BLOOD R UP ARM Performed at [facility], [address], [place], [place] [address]    Special Requests   Final    BOTTLES DRAWN AEROBIC AND ANAEROBIC Blood Culture adequate volume Performed at [facility], [address] [place], [place] [address]    Culture  Setup Time   Final    Organism ID to follow IN [place] TO, READ BACK BY AND VERIFIED WITH: [place] ON 05/11/19 AT 0210 [facility] Performed at [facility], [address] [place], [place] [address]    Culture (A)  Final    STAPHYLOCOCCUS  AUREUS SUSCEPTIBILITIES PERFORMED ON PREVIOUS CULTURE WITHIN THE LAST 5 DAYS. Performed at [facility], [place] [address], [place], [place] [address]    Report Status 05/13/2019 FINAL  Final  Blood Culture ID Panel (Reflexed)     Status: Abnormal   Collection Time: 05/18/2019 12:34 PM  Result Value Ref Range Status   Enterococcus species NOT DETECTED NOT DETECTED Final   Listeria monocytogenes NOT DETECTED NOT DETECTED Final   Staphylococcus species DETECTED (A) NOT DETECTED Final    Comment: CRITICAL RESULT CALLED TO, READ BACK BY AND VERIFIED WITH: SCOTT HALL ON 05/11/19 AT 0210 [facility]    Staphylococcus aureus (BCID) DETECTED (A) NOT DETECTED Final    Comment: Methicillin (oxacillin)-resistant Staphylococcus aureus (MRSA). MRSA is predictably resistant to beta-lactam antibiotics (except ceftaroline). Preferred therapy is vancomycin unless clinically contraindicated. Patient requires contact precautions if  hospitalized. CRITICAL RESULT CALLED TO, READ BACK BY AND VERIFIED WITH: SCOTT HALL ON 05/11/19 AT 0210 [facility]    Methicillin resistance DETECTED (A) NOT DETECTED Final    Comment: CRITICAL RESULT CALLED TO, READ BACK BY AND VERIFIED WITH: SCOTT HALL ON 05/11/19 AT 0210 [place]    Streptococcus species NOT DETECTED NOT DETECTED Final   Streptococcus agalactiae NOT DETECTED NOT DETECTED Final   Streptococcus pneumoniae NOT DETECTED NOT DETECTED Final   Streptococcus pyogenes NOT DETECTED NOT DETECTED Final   Acinetobacter baumannii NOT DETECTED NOT DETECTED Final   Enterobacteriaceae species NOT DETECTED NOT DETECTED Final   Enterobacter cloacae complex NOT DETECTED NOT DETECTED Final   Escherichia coli NOT DETECTED NOT DETECTED Final  Klebsiella oxytoca NOT DETECTED NOT DETECTED Final   Klebsiella pneumoniae NOT DETECTED NOT DETECTED Final   Proteus species NOT DETECTED NOT DETECTED Final   Serratia marcescens NOT DETECTED NOT DETECTED Final   Haemophilus influenzae NOT DETECTED NOT  DETECTED Final   Neisseria meningitidis NOT DETECTED NOT DETECTED Final   Pseudomonas aeruginosa NOT DETECTED NOT DETECTED Final   Candida albicans NOT DETECTED NOT DETECTED Final   Candida glabrata NOT DETECTED NOT DETECTED Final   Candida krusei NOT DETECTED NOT DETECTED Final   Candida parapsilosis NOT DETECTED NOT DETECTED Final   Candida tropicalis NOT DETECTED NOT DETECTED Final    Comment: Performed at [facility], [address] [place], [place] [address]  Blood culture (routine x 2)     Status: Abnormal   Collection Time: 05/10/2019  1:24 PM   Specimen: BLOOD  Result Value Ref Range Status   Specimen Description   Final    BLOOD R LATREAL BICEP Performed at [facility], [address], [place], [place] [address]    Special Requests   Final    BOTTLES DRAWN AEROBIC AND ANAEROBIC Blood Culture adequate volume Performed at [facility], [address] [place], [place] [address]    Culture  Setup Time   Final    IN BOTH AEROBIC AND ANAEROBIC BOTTLES GRAM POSITIVE COCCI CRITICAL VALUE NOTED.  VALUE IS CONSISTENT WITH PREVIOUSLY REPORTED AND CALLED VALUE. Performed at [facility], [address] [place], [place] [address]    Culture METHICILLIN RESISTANT STAPHYLOCOCCUS AUREUS (A)  Final   Report Status 05/13/2019 FINAL  Final   Organism ID, Bacteria METHICILLIN RESISTANT STAPHYLOCOCCUS AUREUS  Final      Susceptibility   Methicillin resistant staphylococcus aureus - MIC*    CIPROFLOXACIN >=8 RESISTANT Resistant     ERYTHROMYCIN >=8 RESISTANT Resistant     GENTAMICIN <=0.5 SENSITIVE Sensitive     OXACILLIN >=4 RESISTANT Resistant     TETRACYCLINE <=1 SENSITIVE Sensitive     VANCOMYCIN <=0.5 SENSITIVE Sensitive     TRIMETH/SULFA >=320 RESISTANT Resistant     CLINDAMYCIN <=0.25 SENSITIVE Sensitive     RIFAMPIN <=0.5 SENSITIVE Sensitive     Inducible Clindamycin NEGATIVE Sensitive     * METHICILLIN RESISTANT STAPHYLOCOCCUS AUREUS   Respiratory Panel by RT PCR (Flu A&B, Covid) - Nasopharyngeal Swab     Status: None   Collection Time: 05/25/2019  1:24 PM   Specimen: Nasopharyngeal Swab  Result Value Ref Range Status   SARS Coronavirus 2 by RT PCR NEGATIVE NEGATIVE Final    Comment: (NOTE) SARS-CoV-2 target nucleic acids are NOT DETECTED. The SARS-CoV-2 RNA is generally detectable in upper respiratoy specimens during the acute phase of infection. The lowest concentration of SARS-CoV-2 viral copies this assay can detect is 131 copies/mL. A negative result does not preclude SARS-Cov-2 infection and should not be used as the sole basis for treatment or other patient management decisions. A negative result may occur with  improper specimen collection/handling, submission of specimen other than nasopharyngeal swab, presence of viral mutation(s) within the areas targeted by this assay, and inadequate number of viral copies (<131 copies/mL). A negative result must be combined with clinical observations, patient history, and epidemiological information. The expected result is Negative. Fact Sheet for Patients:  PinkCheek.be Fact Sheet for Healthcare Providers:  GravelBags.it This test is not yet ap proved or cleared by the [place] FDA and  has been authorized for detection and/or diagnosis of SARS-CoV-2 by FDA  under an Emergency Use Authorization (EUA). This EUA will remain  in effect (meaning this test can be used) for the duration of the COVID-19 declaration under Section 564(b)(1) of the Act, 21 U.S.C. section 360bbb-3(b)(1), unless the authorization is terminated or revoked sooner.    Influenza A by PCR NEGATIVE NEGATIVE Final   Influenza B by PCR NEGATIVE NEGATIVE Final    Comment: (NOTE) The Xpert Xpress SARS-CoV-2/FLU/RSV assay is intended as an aid in  the diagnosis of influenza from Nasopharyngeal swab specimens and  should not be used as a sole  basis for treatment. Nasal washings and  aspirates are unacceptable for Xpert Xpress SARS-CoV-2/FLU/RSV  testing. Fact Sheet for Patients: PinkCheek.be Fact Sheet for Healthcare Providers: GravelBags.it This test is not yet approved or cleared by the [place] FDA and  has been authorized for detection and/or diagnosis of SARS-CoV-2 by  FDA under an Emergency Use Authorization (EUA). This EUA will remain  in effect (meaning this test can be used) for the duration of the  Covid-19 declaration under Section 564(b)(1) of the Act, 21  U.S.C. section 360bbb-3(b)(1), unless the authorization is  terminated or revoked. Performed at [facility], [address] [place], [place] [address]   MRSA PCR Screening     Status: None   Collection Time: 05/24/2019  6:27 PM   Specimen: Nasopharyngeal  Result Value Ref Range Status   MRSA by PCR NEGATIVE NEGATIVE Final    Comment:        The GeneXpert MRSA Assay (FDA approved for NASAL specimens only), is one component of a comprehensive MRSA colonization surveillance program. It is not intended to diagnose MRSA infection nor to guide or monitor treatment for MRSA infections. Performed at [facility], [address] [place], [place] [address]   Urine culture     Status: Abnormal   Collection Time: 05/18/2019 10:30 PM   Specimen: Urine, Clean Catch  Result Value Ref Range Status   Specimen Description   Final    URINE, CLEAN CATCH Performed at [facility], [address], [place], [place] [address]    Special Requests   Final    NONE Performed at [facility], [place], [place] [address]    Culture (A)  Final    >=100,000 COLONIES/mL KLEBSIELLA PNEUMONIAE >=100,000 COLONIES/mL VANCOMYCIN RESISTANT ENTEROCOCCUS    Report Status 05/14/2019 FINAL  Final   Organism ID, Bacteria KLEBSIELLA PNEUMONIAE (A)  Final   Organism ID,  Bacteria VANCOMYCIN RESISTANT ENTEROCOCCUS (A)  Final      Susceptibility   Klebsiella pneumoniae - MIC*    AMPICILLIN >=32 RESISTANT Resistant     CEFAZOLIN <=4 SENSITIVE Sensitive     CEFTRIAXONE <=0.25 SENSITIVE Sensitive     CIPROFLOXACIN <=0.25 SENSITIVE Sensitive     GENTAMICIN <=1 SENSITIVE Sensitive     IMIPENEM 0.5 SENSITIVE Sensitive     NITROFURANTOIN 64 INTERMEDIATE Intermediate     TRIMETH/SULFA <=20 SENSITIVE Sensitive     AMPICILLIN/SULBACTAM 4 SENSITIVE Sensitive     PIP/TAZO <=4 SENSITIVE Sensitive     * >=100,000 COLONIES/mL KLEBSIELLA PNEUMONIAE   Vancomycin resistant enterococcus - MIC*    AMPICILLIN >=32 RESISTANT Resistant     NITROFURANTOIN 256 RESISTANT Resistant     VANCOMYCIN >=32 RESISTANT Resistant     LINEZOLID 2 SENSITIVE Sensitive     * >=100,000 COLONIES/mL VANCOMYCIN RESISTANT ENTEROCOCCUS  Cath Tip Culture     Status: Abnormal   Collection Time: 05/11/19  1:10  PM   Specimen: Catheter Tip; Other  Result Value Ref Range Status   Specimen Description   Final    CATH TIP Performed at [facility], [address] [place], [place] [address]    Special Requests   Final    NONE Performed at [facility], [address] [place], [place] [address]    Culture (A)  Final    >=100,000 COLONIES/mL METHICILLIN RESISTANT STAPHYLOCOCCUS AUREUS   Report Status 05/13/2019 FINAL  Final   Organism ID, Bacteria METHICILLIN RESISTANT STAPHYLOCOCCUS AUREUS (A)  Final      Susceptibility   Methicillin resistant staphylococcus aureus - MIC*    CIPROFLOXACIN >=8 RESISTANT Resistant     ERYTHROMYCIN >=8 RESISTANT Resistant     GENTAMICIN <=0.5 SENSITIVE Sensitive     OXACILLIN >=4 RESISTANT Resistant     TETRACYCLINE <=1 SENSITIVE Sensitive     VANCOMYCIN 1 SENSITIVE Sensitive     TRIMETH/SULFA >=320 RESISTANT Resistant     CLINDAMYCIN <=0.25 SENSITIVE Sensitive     RIFAMPIN <=0.5 SENSITIVE Sensitive     Inducible Clindamycin NEGATIVE  Sensitive     * >=100,000 COLONIES/mL METHICILLIN RESISTANT STAPHYLOCOCCUS AUREUS  Culture, blood (routine x 2)     Status: Abnormal   Collection Time: 05/11/19  4:27 PM   Specimen: BLOOD  Result Value Ref Range Status   Specimen Description   Final    BLOOD A-LINE Performed at [facility], [address], [place], [place] [address]    Special Requests   Final    BOTTLES DRAWN AEROBIC AND ANAEROBIC Blood Culture adequate volume Performed at [facility], [address] [place], [place] [address]    Culture  Setup Time   Final    GRAM POSITIVE COCCI ANAEROBIC BOTTLE ONLY CRITICAL VALUE NOTED.  VALUE IS CONSISTENT WITH PREVIOUSLY REPORTED AND CALLED VALUE. Performed at [facility], [address] [place], [place] [address]    Culture (A)  Final    STAPHYLOCOCCUS AUREUS SUSCEPTIBILITIES PERFORMED ON PREVIOUS CULTURE WITHIN THE LAST 5 DAYS. Performed at [facility], [place] [address] [place], [place] [address]    Report Status 05/29/2019 FINAL  Final  Culture, blood (routine x 2)     Status: Abnormal   Collection Time: 05/12/19  6:06 AM   Specimen: BLOOD  Result Value Ref Range Status   Specimen Description   Final    BLOOD BLOOD RIGHT HAND Performed at [facility], [address], [place], [place] [address]    Special Requests   Final    BOTTLES DRAWN AEROBIC AND ANAEROBIC Blood Culture adequate volume Performed at [facility], [address], [place], [place] [address]    Culture  Setup Time   Final    GRAM POSITIVE COCCI ANAEROBIC BOTTLE ONLY CRITICAL RESULT CALLED TO, READ BACK BY AND VERIFIED WITH: [place] ON 05/12/2019 BY MOSLEY,J Performed at [facility], [address] [place], [place] [address]    Culture METHICILLIN RESISTANT STAPHYLOCOCCUS AUREUS (A)  Final   Report Status 06/06/2019 FINAL  Final   Organism ID, Bacteria METHICILLIN RESISTANT STAPHYLOCOCCUS AUREUS  Final       Susceptibility   Methicillin resistant staphylococcus aureus - MIC*    CIPROFLOXACIN >=8 RESISTANT Resistant     ERYTHROMYCIN >=8 RESISTANT Resistant     GENTAMICIN <=0.5 SENSITIVE Sensitive     OXACILLIN >=4 RESISTANT Resistant     TETRACYCLINE <=1 SENSITIVE Sensitive  VANCOMYCIN 1 SENSITIVE Sensitive     TRIMETH/SULFA >=320 RESISTANT Resistant     CLINDAMYCIN <=0.25 SENSITIVE Sensitive     RIFAMPIN <=0.5 SENSITIVE Sensitive     Inducible Clindamycin NEGATIVE Sensitive     * METHICILLIN RESISTANT STAPHYLOCOCCUS AUREUS  CULTURE, BLOOD (ROUTINE X 2) w Reflex to ID Panel     Status: Abnormal (Preliminary result)   Collection Time: 05/13/19  2:20 PM   Specimen: BLOOD  Result Value Ref Range Status   Specimen Description   Final    BLOOD BLOOD RIGHT HAND Performed at [facility], [address], [place], [place] [address]    Special Requests   Final    BOTTLES DRAWN AEROBIC AND ANAEROBIC Blood Culture adequate volume Performed at [facility], [address], [place], [place] [address]    Culture  Setup Time   Final    GRAM POSITIVE COCCI ANAEROBIC BOTTLE ONLY CRITICAL RESULT CALLED TO, READ BACK BY AND VERIFIED WITH: [facility] KATSOUDAS AT 7342 05/14/19 SDR Performed at [facility], [address] [place], [place] [address]    Culture (A)  Final    STAPHYLOCOCCUS AUREUS SUSCEPTIBILITIES PERFORMED ON PREVIOUS CULTURE WITHIN THE LAST 5 DAYS. Performed at [facility], [place] [address], [place], [place] [address]    Report Status PENDING  Incomplete  CULTURE, BLOOD (ROUTINE X 2) w Reflex to ID Panel     Status: None (Preliminary result)   Collection Time: 05/13/19  3:39 PM   Specimen: BLOOD  Result Value Ref Range Status   Specimen Description BLOOD BLOOD LEFT HAND  Final   Special Requests   Final    BOTTLES DRAWN AEROBIC ONLY Blood Culture adequate volume   Culture   Final    NO GROWTH 2 DAYS Performed at [facility], [address] [place], [place] [address]    Report Status PENDING  Incomplete  CULTURE, BLOOD (ROUTINE X 2) w Reflex to ID Panel     Status: None (Preliminary result)   Collection Time: 05/14/19  3:33 PM   Specimen: BLOOD  Result Value Ref Range Status   Specimen Description BLOOD BLOOD RIGHT HAND  Final   Special Requests   Final    BOTTLES DRAWN AEROBIC AND ANAEROBIC Blood Culture adequate volume   Culture   Final    NO GROWTH < 24 HOURS Performed at [facility], [address] [place], [place] [address]    Report Status PENDING  Incomplete  CULTURE, BLOOD (ROUTINE X 2) w Reflex to ID Panel     Status: None (Preliminary result)   Collection Time: 05/14/19  6:25 PM   Specimen: BLOOD  Result Value Ref Range Status   Specimen Description BLOOD BLOOD LEFT HAND  Final   Special Requests   Final    BOTTLES DRAWN AEROBIC AND ANAEROBIC Blood Culture results may not be optimal due to an inadequate volume of blood received in culture bottles   Culture   Final    NO GROWTH < 12 HOURS Performed at [facility], [address] [place], [place] [address]    Report Status PENDING  Incomplete    Coagulation Studies: No results for input(s): LABPROT, INR in the last 72 hours.  Urinalysis: No results for input(s): COLORURINE, LABSPEC, PHURINE, GLUCOSEU, HGBUR, BILIRUBINUR, KETONESUR, PROTEINUR, UROBILINOGEN, NITRITE, LEUKOCYTESUR in the last 72 hours.  Invalid input(s): APPERANCEUR    Imaging: No results found.   Medications:   . DAPTOmycin (CUBICIN)  IV 500 mg (05/14/19 2122)   . Chlorhexidine Gluconate Cloth  6 each Topical Daily  . insulin aspart  0-9 Units Subcutaneous Q4H  . midodrine  5 mg Oral TID WC  . pantoprazole (PROTONIX) IV  40 mg Intravenous QHS  . vancomycin variable dose per unstable renal function (pharmacist dosing)   Does not apply See admin instructions     Assessment/ Plan:  Ms. Sabrie Moritz is a 74 y.o. black female Ms. Faigy Stretch is a  74 y.o. black female with end stage renal disease on hemodialysis, CVA with left hemiparesis, hypertension, hyperlipidemia, GERD, glaucoma, diabetes mellitus type II, coronary artery disease, gout, seizure disorder who was admitted to [facility] on 05/19/2019 for Hypokalemia [E87.6] Shock ([place]) [R57.9] ESRD (end stage renal disease) ([place]) [N18.6] Hypotension, unspecified hypotension type [I95.9] Anemia, unspecified type [D64.9]  [facility] MWF RIJ permcath 63.5kg  1. End Stage Renal Disease: last hemodialysis treatment was Friday 4/2. No acute indication for dialysis.  Currently without dialysis catheter.  Appreciate vascular input - temp HD catheter for today and then hemodialysis treatment. Will plan on removing temp HD catheter after dialysis.   2. Hypotension: with MRSA bacteremia/sepsis requiring vasopressors: Persistent positive blood cultres from 4/6.  Weaned off vasopressors - Continue vancomycin and daptomycin. Appreciate ID input. - Catheter free period since [place] 4/4  3. Metabolic acidosis - scheduled for dialysis today  4. Anemia with chronic kidney disease: with iron deficiency and folate deficiency. Subcu EPO on 4/7.  Hemoglobin 8.3  5. Secondary Hyperparathyroidism: with hypophosphatemia. Off binders.   6. 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   7. Thrombocytopenia: acute drop most likely due to infection. Platelet count was 247K on 3/22. Continue to monitor.     LOS: 5 Rayhan Groleau 4/8/20218:10 AM

## 2019-05-15 NOTE — Progress Notes (Signed)
This note also relates to the following rows which could not be included: Pulse Rate - Cannot attach notes to unvalidated device data Resp - Cannot attach notes to unvalidated device data BP - Cannot attach notes to unvalidated device data SpO2 - Cannot attach notes to unvalidated device data  Hd started  

## 2019-05-15 NOTE — Progress Notes (Signed)
OT Cancellation Note  Patient Details Name: Crystal Vargas MRN: [id number] DOB: [birth year]   Cancelled Treatment:    Reason Eval/Treat Not Completed: Patient at procedure or test/ unavailable  Pt getting temporary dialysis catheter inserted at this time. Will f/u as able for OT treatment. Thank you.   Gerrianne Scale, [place], OTR/L ascom [phone] 06/04/2019, 1:23 PM

## 2019-05-15 NOTE — Progress Notes (Signed)
CBG 65 at 1203, RN administered 1/2 amp of dextrose per hypoglycemia protocol. CBG resolved to 117. MD now at bedside inserting temporary dialysis cath. VSS. Will continue to monitor.

## 2019-05-15 NOTE — Progress Notes (Signed)
Pre dialysis assessment 

## 2019-05-15 NOTE — Progress Notes (Signed)
Pt back from dialysis. In no distress at this time. Daughter at bedside. Dinner tray ordered.

## 2019-05-15 NOTE — Consult Note (Signed)
Pharmacy Antibiotic Note  Crystal Vargas is a 74 y.o. female admitted on 05/23/2019 with MRSA bacteremia.  Pharmacy has been consulted for Vancomycin and Daptomycin.  Patient receives hemodialysis on Monday, Wednesday and Friday.  Per nephrology note, no current indication for hemodialysis.  Plan for removal of CVC (placed 4/3) as patient continues to show bacterial growth in blood. Temporary dialysis catheter placed today, 4/8, and will be removed post dialysis.   Plan:  Vancomycin:  Vancomycin 750mg  IV x 1 with dialysis on 4/8. Will follow up on dialysis plan on 4/9 and redose accordingly.   Daptomycin: Per discussion with ID, will start daptomycin in the setting of persistent bacteremia.  Will give daptomycin 500 mg (~8 mg/kg) IV q48 hours.  Will obtain CK with AM labs.  Pharmacy will continue to dose and adjust per consult.   Height: 5\' 2"  (157.5 cm) Weight: 66.3 kg (146 lb 2.6 oz) IBW/kg (Calculated) : 50.1  Temp (24hrs), Avg:98.2 F (36.8 C), Min:97.8 F (36.6 C), Max:98.8 F (37.1 C)  Recent Labs  Lab 05/09/2019 1324 05/25/2019 1324 05/11/2019 2135 06/01/2019 2306 05/11/19 0611 05/12/19 0502 05/12/19 0607 05/12/19 1506 05/13/19 0556 05/13/19 1420 05/14/19 1051 05/18/2019 0633 05/14/2019 0757  WBC 9.4   < >  --   --  8.5  --  9.9  --  5.1  --  6.5  --  7.7  CREATININE 1.68*   < >   < >  --  2.13* 2.47*  --   --  3.01*  --  3.54* 3.76*  --   LATICACIDVEN 1.9  --   --  1.5 1.4  --   --   --   --   --   --   --   --   VANCOTROUGH  --   --   --   --   --   --   --   --   --  32*  --   --   --   VANCORANDOM  --   --   --   --   --   --   --  17  --   --   --  25  --    < > = values in this interval not displayed.    Estimated Creatinine Clearance: 11.4 mL/min (A) (by C-G formula based on SCr of 3.76 mg/dL (H)).    Allergies  Allergen Reactions  . Abacavir Other (See Comments)  . Cephalosporins Other (See Comments)  . Ciprofloxacin Other (See Comments)  . Nsaids Other (See Comments)   . Penicillin G Other (See Comments)  . Quinolones Other (See Comments)  . Salicylates Other (See Comments)    Antimicrobials this admission: Vancomycin 4/3 >>  Aztreonam 4/3 x 1 Daptomycin 4/7 >>  Microbiology results: 4/7 BCx: GPC 4/7 Cath Tip: no growth < 24 hours  4/6 Bcx: GPC 1/4 (anaerobic bottle) 4/4 Bcx:  Staph aureus (A-line) 4/5 Bcx:  Staph aureus 4/3 BCx: MRSA 4/4 Cath tip:  MRSA 4/3 UCx: Kleb - Likely contamination  4/3 MRSA PCR: negative 4/3 Flu A&B, Covid (nasopharyngeal swab):  negative  Thank you for allowing pharmacy to be a part of this patient's care.  Joley Utecht L 06/02/2019 2:55 PM

## 2019-05-15 NOTE — Care Management (Signed)
PCCM pick up from Dr. Mortimer Fries  74 year old lady with hx of severe stroke, hypertension, hyperlipidemia, diabetes mellitus, GERD, depression, CAD, ESRD-HD, who was admitted to ICU due to septic shock 2/2 MRSA bacteremia. This is due to dialysis catheter infection which was removed. VVS will place temporary dialysis catheter today.  Patient is off pressor since yesterday.  Hemodynamically stable.  Blood pressure 178/76, temperature 97.8.   Ivor Costa

## 2019-05-15 NOTE — Progress Notes (Signed)
Post dialysis assessment 

## 2019-05-15 NOTE — Op Note (Signed)
  OPERATIVE NOTE   PROCEDURE: 1. Ultrasound guidance for vascular access right femoral vein 2. Placement of a 30 cm triple-lumen dialysis catheter right femoral vein  PRE-OPERATIVE DIAGNOSIS: 1. renal failure 2.  Bacteremia precluding permanent access at this time  POST-OPERATIVE DIAGNOSIS: Same  SURGEON: Leotis Pain, MD  ASSISTANT(S): None  ANESTHESIA: local  ESTIMATED BLOOD LOSS: Minimal   FINDING(S): 1. None  SPECIMEN(S): None  INDICATIONS:  Patient is a 74 y.o.female who presents with bacteremia and renal failure.  She has not had dialysis in several days, and a temporary catheter will need to be placed that she still has persistent bacteremia.  Risks and benefits were discussed, and informed consent was obtained..  DESCRIPTION: After obtaining full informed written consent, the patient was laid flat in the bed. The right groin was sterilely prepped and draped in a sterile surgical field was created. The right femoral vein was visualized with ultrasound and found to be widely patent. It was then accessed under direct guidance without difficulty with a Seldinger needle and a permanent image was recorded. A J-wire was then placed. After skin nick and dilatation, a 30 cm triple-lumen dialysis catheter was placed over the wire and the wire was removed. The lumens withdrew dark red nonpulsatile blood and flushed easily with sterile saline. The catheter was secured to the skin with 3 nylon sutures. Sterile dressing was placed.  COMPLICATIONS: None  CONDITION: Stable  Leotis Pain 05/30/2019 1:40 PM  This note was created with Dragon Medical transcription system. Any errors in dictation are purely unintentional.

## 2019-05-15 NOTE — Progress Notes (Signed)
This note also relates to the following rows which could not be included: Pulse Rate - Cannot attach notes to unvalidated device data Resp - Cannot attach notes to unvalidated device data BP - Cannot attach notes to unvalidated device data  Hd completed  

## 2019-05-16 DIAGNOSIS — N186 End stage renal disease: Secondary | ICD-10-CM | POA: Diagnosis not present

## 2019-05-16 DIAGNOSIS — R7881 Bacteremia: Secondary | ICD-10-CM | POA: Diagnosis not present

## 2019-05-16 DIAGNOSIS — B9562 Methicillin resistant Staphylococcus aureus infection as the cause of diseases classified elsewhere: Secondary | ICD-10-CM | POA: Diagnosis not present

## 2019-05-16 DIAGNOSIS — T827XXA Infection and inflammatory reaction due to other cardiac and vascular devices, implants and grafts, initial encounter: Secondary | ICD-10-CM | POA: Diagnosis not present

## 2019-05-16 LAB — RENAL FUNCTION PANEL
Albumin: 2.3 g/dL — ABNORMAL LOW (ref 3.5–5.0)
Anion gap: 8 (ref 5–15)
BUN: 24 mg/dL — ABNORMAL HIGH (ref 8–23)
CO2: 26 mmol/L (ref 22–32)
Calcium: 8.8 mg/dL — ABNORMAL LOW (ref 8.9–10.3)
Chloride: 108 mmol/L (ref 98–111)
Creatinine, Ser: 2.16 mg/dL — ABNORMAL HIGH (ref 0.44–1.00)
GFR calc Af Amer: 25 mL/min — ABNORMAL LOW (ref >60)
GFR calc non Af Amer: 22 mL/min — ABNORMAL LOW (ref >60)
Glucose, Bld: 269 mg/dL — ABNORMAL HIGH (ref 70–99)
Phosphorus: 1.3 mg/dL — ABNORMAL LOW (ref 2.5–4.6)
Potassium: 3.5 mmol/L (ref 3.5–5.1)
Sodium: 142 mmol/L (ref 135–145)

## 2019-05-16 LAB — CBC
HCT: 23.7 % — ABNORMAL LOW (ref 36.0–46.0)
Hemoglobin: 8.3 g/dL — ABNORMAL LOW (ref 12.0–15.0)
MCH: 31.7 pg (ref 26.0–34.0)
MCHC: 35 g/dL (ref 30.0–36.0)
MCV: 90.5 fL (ref 80.0–100.0)
Platelets: 70 10*3/uL — ABNORMAL LOW (ref 150–400)
RBC: 2.62 MIL/uL — ABNORMAL LOW (ref 3.87–5.11)
RDW: 18.1 % — ABNORMAL HIGH (ref 11.5–15.5)
WBC: 9.5 10*3/uL (ref 4.0–10.5)
nRBC: 0 % (ref 0.0–0.2)

## 2019-05-16 LAB — GLUCOSE, CAPILLARY
Glucose-Capillary: 115 mg/dL — ABNORMAL HIGH (ref 70–99)
Glucose-Capillary: 159 mg/dL — ABNORMAL HIGH (ref 70–99)
Glucose-Capillary: 166 mg/dL — ABNORMAL HIGH (ref 70–99)
Glucose-Capillary: 174 mg/dL — ABNORMAL HIGH (ref 70–99)
Glucose-Capillary: 245 mg/dL — ABNORMAL HIGH (ref 70–99)
Glucose-Capillary: 90 mg/dL (ref 70–99)

## 2019-05-16 LAB — CULTURE, BLOOD (ROUTINE X 2): Special Requests: ADEQUATE

## 2019-05-16 MED ORDER — CARVEDILOL 25 MG PO TABS
25.0000 mg | ORAL_TABLET | Freq: Two times a day (BID) | ORAL | Status: DC
  Administered 2019-05-16 – 2019-05-31 (×19): 25 mg via ORAL
  Filled 2019-05-16 (×23): qty 1

## 2019-05-16 MED ORDER — MIDODRINE HCL 5 MG PO TABS
5.0000 mg | ORAL_TABLET | ORAL | Status: DC
  Administered 2019-05-19 – 2019-06-02 (×3): 5 mg via ORAL
  Filled 2019-05-16 (×7): qty 1

## 2019-05-16 MED ORDER — AMLODIPINE BESYLATE 10 MG PO TABS
10.0000 mg | ORAL_TABLET | Freq: Every day | ORAL | Status: DC
  Administered 2019-05-16 – 2019-05-22 (×5): 10 mg via ORAL
  Filled 2019-05-16 (×7): qty 1

## 2019-05-16 MED ORDER — ATORVASTATIN CALCIUM 10 MG PO TABS
10.0000 mg | ORAL_TABLET | Freq: Every day | ORAL | Status: DC
  Administered 2019-05-16 – 2019-05-27 (×10): 10 mg via ORAL
  Filled 2019-05-16 (×12): qty 1

## 2019-05-16 NOTE — Consult Note (Signed)
Pharmacy Antibiotic Note  Crystal Vargas is a 74 y.o. female admitted on 06/06/2019 with MRSA bacteremia.  Pharmacy has been consulted for Vancomycin and Daptomycin.  Patient receives hemodialysis on Monday, Wednesday and Friday.  Per nephrology note, no current indication for hemodialysis.  Plan for removal of CVC (placed 4/3) as patient continues to show bacterial growth in blood. Temporary dialysis catheter placed today, 4/8, and will be removed post dialysis.   Plan:  Vancomycin:  Vancomycin 750mg  IV x 1 given with dialysis on 4/8. No dialysis on 4/9. Will follow up with dialysis plan on 4/10. .   Daptomycin: Per discussion with ID, will start daptomycin in the setting of persistent bacteremia.  Will give daptomycin 500 mg (~8 mg/kg) IV q48 hours. CK with am labs on 4/12.   Pharmacy will continue to dose and adjust per consult.   Height: 5\' 2"  (157.5 cm) Weight: 68.2 kg (150 lb 5.7 oz) IBW/kg (Calculated) : 50.1  Temp (24hrs), Avg:98.3 F (36.8 C), Min:98 F (36.7 C), Max:98.6 F (37 C)  Recent Labs  Lab 05/14/2019 1324 05/18/2019 1324 05/11/2019 2135 05/23/2019 2306 05/11/19 0611 05/11/19 0611 05/12/19 0502 05/12/19 0607 05/12/19 1506 05/13/19 0556 05/13/19 1420 05/14/19 1051 05/12/2019 0633 05/14/2019 0757 05/16/19 0327  WBC 9.4   < >  --   --  8.5   < >  --  9.9  --  5.1  --  6.5  --  7.7 9.5  CREATININE 1.68*   < >   < >  --  2.13*   < > 2.47*  --   --  3.01*  --  3.54* 3.76*  --  2.16*  LATICACIDVEN 1.9  --   --  1.5 1.4  --   --   --   --   --   --   --   --   --   --   VANCOTROUGH  --   --   --   --   --   --   --   --   --   --  32*  --   --   --   --   VANCORANDOM  --   --   --   --   --   --   --   --  17  --   --   --  25  --   --    < > = values in this interval not displayed.    Estimated Creatinine Clearance: 20 mL/min (A) (by C-G formula based on SCr of 2.16 mg/dL (H)).    Allergies  Allergen Reactions  . Abacavir Other (See Comments)  . Cephalosporins Other (See  Comments)  . Ciprofloxacin Other (See Comments)  . Nsaids Other (See Comments)  . Penicillin G Other (See Comments)  . Quinolones Other (See Comments)  . Salicylates Other (See Comments)    Antimicrobials this admission: Vancomycin 4/3 >>  Aztreonam 4/3 x 1 Daptomycin 4/7 >>  Microbiology results: 4/9 BCx: sent  4/7 BCx: GPC 4/7 Cath Tip: no growth x 2 days  4/6 Bcx: GPC 1/4 (anaerobic bottle) 4/4 Bcx:  Staph aureus (A-line) 4/5 Bcx:  Staph aureus 4/3 BCx: MRSA 4/4 Cath tip:  MRSA 4/3 UCx: Kleb - Likely contamination  4/3 MRSA PCR: negative 4/3 Flu A&B, Covid (nasopharyngeal swab):  negative  Thank you for allowing pharmacy to be a part of this patient's care.  [place] [employer] L 05/16/2019 2:40 PM

## 2019-05-16 NOTE — Progress Notes (Addendum)
   Date of Admission:  06/06/2019        Subjective: Pt says she is fine Minimally verbal  Medications:  . amLODipine  10 mg Oral Daily  . atorvastatin  10 mg Oral q1800  . carvedilol  25 mg Oral BID WC  . Chlorhexidine Gluconate Cloth  6 each Topical Daily  . insulin aspart  0-9 Units Subcutaneous Q4H  . midodrine  5 mg Oral Q M,W,F  . pantoprazole (PROTONIX) IV  40 mg Intravenous QHS  . vancomycin variable dose per unstable renal function (pharmacist dosing)   Does not apply See admin instructions    Objective: Vital signs in last 24 hours: Temp:  [97.8 F (36.6 C)-98.6 F (37 C)] 98.2 F (36.8 C) (04/09 0800) Pulse Rate:  [61-80] 68 (04/09 0900) Resp:  [13-27] 17 (04/09 0900) BP: (111-194)/(50-129) 184/121 (04/09 0900) SpO2:  [94 %-100 %] 97 % (04/09 0900) Weight:  [68.2 kg] 68.2 kg (04/09 0500)  PHYSICAL EXAM:  General: Alert,  no distress, oriented in person- answers simple questions Left facial palsy Heart: irregular. Abdomen: Soft, non-tender,not distended. Bowel sounds normal. No masses Extremities: . No edema.  Skin: No rashes or lesions. Or bruising Lymph: Cervical, supraclavicular normal. Neurologic: hemiplegia left Lab Results Recent Labs    05/14/19 1051 06/06/2019 0633 05/14/2019 0757 05/16/19 0327  WBC   < >  --  7.7 9.5  HGB   < >  --  8.9* 8.3*  HCT   < >  --  25.5* 23.7*  NA  --  141  --  142  K  --  3.7  --  3.5  CL  --  110  --  108  CO2  --  19*  --  26  BUN  --  48*  --  24*  CREATININE  --  3.76*  --  2.16*   < > = values in this interval not displayed.   Liver Panel Recent Labs    05/14/19 1051 05/16/19 0327  ALBUMIN 2.7* 2.3*   Sedimentation Rate No results for input(s): ESRSEDRATE in the last 72 hours. C-Reactive Protein No results for input(s): CRP in the last 72 hours.  Microbiology:  Studies/Results: PERIPHERAL VASCULAR CATHETERIZATION  Result Date: 05/25/2019 See op note    Assessment/Plan: Micro 4/3  BC-MRSA 4/4 cath tip MRSA 4/5 BC and 4/6 BC - positive as well 4/7 BC MRSA 4/9   Impression/Recommendation Persistent  MRSA bacteremia ( 4/3-4/7) in a ptient with ESRD with infected HD catheter which has been removed on 4/4- No cardiac device or hardware Currently on vanco ( MIC 1) + daptomycin left femoral catheter removed She will need TEE but not sure whether that can be done with her dysphagia and prior esophageal dilatation for schatzki's ring. With prsistent bacteremia she will need atleast 6 weeks of IV antibiotic If today's blood culture is posiitve then will add ceftaroline to Dapto and DC vanco   Cephalosporin allergy mentioned in the chart- family not aware. We can use ceftaroline  ESRD on HD Pt can have a temporary dialysis catheter which need not be removed every day- a permanent catheter cannot be placed until the bacteremia is cleared   Discussed the management with the nurse ID will follow her remotely this weekend- call if needed

## 2019-05-16 NOTE — Progress Notes (Signed)
Speech Language Pathology Treatment: Dysphagia  Patient Details Name: Crystal Vargas MRN: [id number] DOB: 1945-03-06 Today's Date: 05/16/2019 Time: 0272-5366 SLP Time Calculation (min) (ACUTE ONLY): 40 min  Assessment / Plan / Recommendation Clinical Impression  Pt seen for ongoing assessment of swallowing; toleration of po diet w/ trials to upgrade as appropriate and safe for pt. NSG reported good toleration of current dysphagia diet but w/ only minimal amounts of po's taken at one time -- pt demonstrated such during the BSE. Dtr stated pt required a "feeding tube last time when she didn't eat enough". Pt awake this morning, smiled at SLP. Pt was verbal w/ SLP but w/ low volume of speech; quiet. She gave an "ouch" when moved to touched. IV just placed.  Pt positioned upright in bed for po's -- needed full support w/ this. Pt did not attempt to hold the cup; she stated she could not -- suspect she is totally dependent w/ feeding. Pt consumed trials of ice chips w/ no overt clinical s/s of aspiration noted; timely munching and oral phase control w/ fairly prompt swallows following the mastication. However, w/ trials of thin liquids (IN SMALL AMOUNTS VIA CUP), pt exhibited overt Coughing w/ 1/5 trials. No decline in O2 sats (96%) or overall respiratory status. Pt then given trials of puree and Nectar liquids as is her diet w/ no clinical s/s of aspiration noted. Pt only opens mouth for miniscule amounts of po's at one time -- seems Guarded about wanting to open mouth to accept TSP amounts. Total feeding support.  Pt appears safest for the current Dysphagia level 1 w/ Nectar liquids diet in light of current illness and hospitalization as well as w/ suspected Baseline Cognitive decline. Pt has a h/o Esophageal phase dysmotility as well -- see chart notes. Pt has a h/o Not eating well requiring a "feeding tube" per Dtr's report during previous CVA. Recommend continue aspiration precautions; Single Ice Chips for  Pleasure w/ aspiration precautions and NSG Supervision; feeding support w/ all oral intake/meals. Pills in Puree Crushed for ease of swallowing and Esophageal clearing. ST services will continue to monitor pt's status but suspect this diet consistency might be beneficial for Discharge w/ Pleasure sips of thin liquids (Water) post thorough oral care in light of pt's medical and Cognitive history; risk for aspiration thus Pulmonary impact/decline. NSG updated.     HPI HPI: Pt is a 74 year old lifelong never smoker who has been followed by [facility] nephrology and initiated dialysis in January.  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  She has chronic issues with dysphagia due to Schatzki's ring and has been evaluated by GI and has had esophageal dilatation in the past.  Daughter also stated pt had a "Feeding Tube after her stroke because she wasn't eating enough".  The patient cannot provide reliable history due to altered mental status.  Daughter who is present with her states that she has had poor p.o. intake due to dysphagia.  She currently resides in a facility in [place] due to needing skilled nursing care.  Pt had been declining x2 weeks, reported by speech therapy at [facility] where pt resides.  Pt was admitted w/ septic shock and MRSA Bacteremia; MD noted Severe protein calorie malnutrition.  A Palliative Care consult is ordered for [name].       SLP Plan  Continue with current plan of care       Recommendations  Diet recommendations: Dysphagia 1 (puree);Nectar-thick liquid(ice chips for pleasure) Liquids provided via: Cup;Straw Medication Administration: Crushed with puree(for safer swallowing) Supervision: Full supervision/cueing for compensatory strategies;Trained caregiver to feed patient Compensations: Minimize environmental distractions;Slow rate;Small sips/bites;Lingual sweep for clearance of  pocketing;Multiple dry swallows after each bite/sip;Follow solids with liquid Postural Changes and/or Swallow Maneuvers: Seated upright 90 degrees;Upright 30-60 min after meal                General recommendations: (Dietician f/u; Palliative care consult for [place]) Oral Care Recommendations: Oral care BID;Oral care before and after PO;Staff/trained caregiver to provide oral care Follow up Recommendations: Skilled Nursing facility(TBD) SLP Visit Diagnosis: Dysphagia, oropharyngeal phase (R13.12)(Cognitive decline; previous CVA) Plan: Continue with current plan of care       [place], [place], CCC-SLP , 05/16/2019, 10:20 AM

## 2019-05-16 NOTE — Progress Notes (Signed)
Physical Therapy Treatment Patient Details Name: Crystal Vargas MRN: [id number] DOB: [birth year] Today's Date: 05/16/2019    History of Present Illness 74 year old who has been followed by [facility] nephrology and initiated dialysis in January.  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 Infected HD port; admitted with septic shock, MRSA bacteremia.    PT Comments    Pt is laying in bed looking more awake than on PT exam.  However she continues to have significant weakness with baseline L limitations and inconsistent ability to perform R LE exercises, especially against light resistance.  We again did some mobility, sitting at EOB but pt continues to show very poor postural control, righting reaction or ability to use UE to assist with maintaining sitting; mod assist t/o the effort to maintain upright with EOB sitting.  Max assist for to/from supine/sit.  Follow Up Recommendations  SNF;Supervision/Assistance - 24 hour(from [place] )     Equipment Recommendations  None recommended by PT    Recommendations for Other Services       Precautions / Restrictions Precautions Precautions: Fall Restrictions Weight Bearing Restrictions: (no offical WBing limitations , chronic L sided weakness) LUE Weight Bearing: Non weight bearing LLE Weight Bearing: Non weight bearing    Mobility  Bed Mobility Overal bed mobility: Needs Assistance Bed Mobility: Supine to Sit;Sit to Supine     Supine to sit: Total assist Sit to supine: Total assist   General bed mobility comments: Pt showed little ability to participate with getting to EOB.  R shoulder frozen, L essentially flaccid, difficulty with any AROM.  Pt falling backward t/o the sitting effort, needing constant mod (+/-) assist.  Aided R hand onto mattress/rail with heavy cuing to try and maintain sitting.  unable to maintain without at least min  assist.    Transfers                 General transfer comment: though pt reports that she gets to w/c it is hard to see how this would be anything other than a total assist/dependent transfer and question how she would maintain sitting posture even with seat back once in the w/c.  Regardless mobility deferred today as pt is unsafe to even sit at EOB much less transfer  Ambulation/Gait                 Stairs             Wheelchair Mobility    Modified Rankin (Stroke Patients Only)       Balance Overall balance assessment: Needs assistance Sitting-balance support: Single extremity supported Sitting balance-Leahy Scale: Zero Sitting balance - Comments: pt unable to show any righting response of ability to use UEs to support herself                                    Cognition Arousal/Alertness: Awake/alert Behavior During Therapy: Flat affect Overall Cognitive Status: Difficult to assess                                 General Comments: brief and often delayed replies (when able at all)      Exercises General Exercises - Lower Extremity Ankle Circles/Pumps: PROM;10 reps;AROM(AROM on R, PROM only on L with c/o  pain each rep) Quad Sets: AROM;PROM;10 reps(unable to meaningfully engage L quad) Short Arc Quad: AAROM;PROM;10 reps(inconsistent AROM on R, P/AAROM only on L) Heel Slides: AROM;PROM;10 reps("ow" with each PROM rep on L, resisted leg ext on R) Hip ABduction/ADduction: PROM;10 reps;AROM(AROM/light resistance on R, very little AAROM L ADd)    General Comments        Pertinent Vitals/Pain Pain Assessment: (replies "ow" with essentially all movement)    Home Living                      Prior Function            PT Goals (current goals can now be found in the care plan section) Progress towards PT goals: Progressing toward goals    Frequency    Min 2X/week      PT Plan Current plan remains  appropriate    Co-evaluation              AM-PAC PT "6 Clicks" Mobility   Outcome Measure  Help needed turning from your back to your side while in a flat bed without using bedrails?: Total Help needed moving from lying on your back to sitting on the side of a flat bed without using bedrails?: Total Help needed moving to and from a bed to a chair (including a wheelchair)?: Total Help needed standing up from a chair using your arms (e.g., wheelchair or bedside chair)?: Total Help needed to walk in hospital room?: Total Help needed climbing 3-5 steps with a railing? : Total 6 Click Score: 6    End of Session   Activity Tolerance: Patient limited by fatigue;Patient limited by lethargy Patient left: with bed alarm set;with call bell/phone within reach Nurse Communication: Mobility status(asks for something to drink ) PT Visit Diagnosis: Muscle weakness (generalized) (M62.81);Other abnormalities of gait and mobility (R26.89)     Time: 3491-7915 PT Time Calculation (min) (ACUTE ONLY): 25 min  Charges:  $Therapeutic Exercise: 8-22 mins $Therapeutic Activity: 8-22 mins                     Kreg Shropshire, DPT 05/16/2019, 4:28 PM

## 2019-05-16 NOTE — Progress Notes (Signed)
[facility]  ROUNDING NOTE   Subjective:   Right femoral temp HD catheter placed yesterday and patient received hemodialysis treatment. Tolerated treatment well.  However due to patient having no IV access - vascath was removed.    Objective:  Vital signs in last 24 hours:  Temp:  [97.8 F (36.6 C)-98.6 F (37 C)] 98.6 F (37 C) (04/09 0400) Pulse Rate:  [61-80] 69 (04/09 0859) Resp:  [13-27] 19 (04/09 0600) BP: (111-194)/(50-129) 172/51 (04/09 0859) SpO2:  [94 %-100 %] 97 % (04/09 0600) Weight:  [68.2 kg] 68.2 kg (04/09 0500)  Weight change: 1.9 kg Filed Weights   05/14/19 0500 06/05/2019 0500 05/16/19 0500  Weight: 66.2 kg 66.3 kg 68.2 kg    Intake/Output: I/O last 3 completed shifts: In: 110 [IV Piggyback:110] Out: 0    Intake/Output this shift:  No intake/output data recorded.  Physical Exam: General: Ill appearing  Head: Normocephalic, atraumatic.   Eyes: Anicteric, PERRL  Neck: Supple, trachea midline  Lungs:  clear  Heart: Regular rate and rhythm  Abdomen:  Soft, nontender   Extremities:  no peripheral edema.  Neurologic: Left sided weakness. Moving her extremities. Alert to self and place  Skin: No lesions  Access: none    Basic Metabolic Panel: Recent Labs  Lab 05/17/2019 1324 06/05/2019 1625 06/05/2019 2135 05/24/2019 2135 05/11/19 0611 05/11/19 1627 05/12/19 0502 05/12/19 0502 05/13/19 0556 05/13/19 0556 05/14/19 1051 05/26/2019 0633 05/16/19 0327  NA   < >  --  136   < > 134*  --  135  --  138  --  143 141 142  K   < >  --  3.5   < > 3.6   < > 4.2  --  4.1  --  4.3 3.7 3.5  CL   < >  --  103   < > 100  --  103  --  105  --  112* 110 108  CO2   < >  --  15*   < > 15*  --  21*  --  16*  --  16* 19* 26  GLUCOSE   < >  --  202*   < > 261*  --  157*  --  140*  --  123* 117* 269*  BUN   < >  --  25*   < > 28*  --  30*  --  38*  --  44* 48* 24*  CREATININE   < >  --  2.04*   < > 2.13*  --  2.47*  --  3.01*  --  3.54* 3.76* 2.16*  CALCIUM    < >  --  7.9*   < > 7.8*  --  8.5*   < > 9.1   < > 9.4 9.3 8.8*  MG  --  1.6*  --   --  2.3  --  2.0  --   --   --   --   --   --   PHOS  --   --  1.6*  --  4.6  --  3.6  --   --   --  3.1  --  1.3*   < > = values in this interval not displayed.    Liver Function Tests: Recent Labs  Lab 05/17/2019 1324 05/14/19 1051 05/16/19 0327  AST 26  --   --   ALT 14  --   --   ALKPHOS 55  --   --  BILITOT 1.5*  --   --   PROT 3.9*  --   --   ALBUMIN 1.3* 2.7* 2.3*   Recent Labs  Lab 05/14/2019 1324  LIPASE <10*   Recent Labs  Lab 06/04/2019 1234  AMMONIA 13    CBC: Recent Labs  Lab 05/09/2019 1324 05/11/19 0611 05/12/19 0607 05/13/19 0556 05/14/19 1051 05/22/2019 0757 05/16/19 0327  WBC 9.4   < > 9.9 5.1 6.5 7.7 9.5  NEUTROABS 7.5  --   --   --   --   --   --   HGB 6.9*   < > 9.5* 7.8* 8.3* 8.9* 8.3*  HCT 21.4*   < > 27.6* 23.6* 22.2* 25.5* 23.7*  MCV 103.4*   < > 96.2 96.3 86.4 93.4 90.5  PLT 82*   < > 80* 38* 59* 51* 70*   < > = values in this interval not displayed.    Cardiac Enzymes: Recent Labs  Lab 05/14/2019 1324 06/05/2019 0633  CKTOTAL 17* 10*    BNP: Invalid input(s): POCBNP  CBG: Recent Labs  Lab 06/06/2019 1836 06/04/2019 1952 06/04/2019 2322 05/16/19 0354 05/16/19 0734  GLUCAP 92 86 184* 245* 166*    Microbiology: Results for orders placed or performed during the hospital encounter of 06/01/2019  Blood culture (routine x 2)     Status: Abnormal   Collection Time: 05/13/2019 12:34 PM   Specimen: BLOOD  Result Value Ref Range Status   Specimen Description   Final    BLOOD R UP ARM Performed at [facility], [address], [place], [place] [address]    Special Requests   Final    BOTTLES DRAWN AEROBIC AND ANAEROBIC Blood Culture adequate volume Performed at [facility], [address] [place], [place] [address]    Culture  Setup Time   Final    Organism ID to follow IN [place] TO, READ BACK BY AND VERIFIED WITH: [place] ON 05/11/19 AT 0210 [facility] Performed at [facility], [address] [place], [place] [address]    Culture (A)  Final    STAPHYLOCOCCUS AUREUS SUSCEPTIBILITIES PERFORMED ON PREVIOUS CULTURE WITHIN THE LAST 5 DAYS. Performed at [facility], [place] [address] [place], [place] [address]    Report Status 05/13/2019 FINAL  Final  Blood Culture ID Panel (Reflexed)     Status: Abnormal   Collection Time: 06/04/2019 12:34 PM  Result Value Ref Range Status   Enterococcus species NOT DETECTED NOT DETECTED Final   Listeria monocytogenes NOT DETECTED NOT DETECTED Final   Staphylococcus species DETECTED (A) NOT DETECTED Final    Comment: CRITICAL RESULT CALLED TO, READ BACK BY AND VERIFIED WITH: SCOTT HALL ON 05/11/19 AT 0210 [facility]    Staphylococcus aureus (BCID) DETECTED (A) NOT DETECTED Final    Comment: Methicillin (oxacillin)-resistant Staphylococcus aureus (MRSA). MRSA is predictably resistant to beta-lactam antibiotics (except ceftaroline). Preferred therapy is vancomycin unless clinically contraindicated. Patient requires contact precautions if  hospitalized. CRITICAL RESULT CALLED TO, READ BACK BY AND VERIFIED WITH: SCOTT HALL ON 05/11/19 AT 0210 [facility]    Methicillin resistance DETECTED (A) NOT DETECTED Final    Comment: CRITICAL RESULT CALLED TO, READ BACK BY AND VERIFIED WITH: SCOTT HALL ON 05/11/19 AT [address]    Streptococcus species NOT DETECTED NOT DETECTED Final   Streptococcus agalactiae NOT DETECTED NOT DETECTED Final   Streptococcus pneumoniae NOT DETECTED NOT DETECTED Final  Streptococcus pyogenes NOT DETECTED NOT DETECTED Final   Acinetobacter baumannii NOT DETECTED NOT DETECTED Final   Enterobacteriaceae species NOT DETECTED NOT DETECTED Final   Enterobacter cloacae complex NOT DETECTED NOT DETECTED Final   Escherichia coli NOT DETECTED NOT DETECTED Final   Klebsiella oxytoca NOT DETECTED NOT  DETECTED Final   Klebsiella pneumoniae NOT DETECTED NOT DETECTED Final   Proteus species NOT DETECTED NOT DETECTED Final   Serratia marcescens NOT DETECTED NOT DETECTED Final   Haemophilus influenzae NOT DETECTED NOT DETECTED Final   Neisseria meningitidis NOT DETECTED NOT DETECTED Final   Pseudomonas aeruginosa NOT DETECTED NOT DETECTED Final   Candida albicans NOT DETECTED NOT DETECTED Final   Candida glabrata NOT DETECTED NOT DETECTED Final   Candida krusei NOT DETECTED NOT DETECTED Final   Candida parapsilosis NOT DETECTED NOT DETECTED Final   Candida tropicalis NOT DETECTED NOT DETECTED Final    Comment: Performed at [facility], [address] [place], [place] [address]  Blood culture (routine x 2)     Status: Abnormal   Collection Time: 05/10/2019  1:24 PM   Specimen: BLOOD  Result Value Ref Range Status   Specimen Description   Final    BLOOD R LATREAL BICEP Performed at [facility], [address], [place], [place] [address]    Special Requests   Final    BOTTLES DRAWN AEROBIC AND ANAEROBIC Blood Culture adequate volume Performed at [facility], [address] [place], [place] [address]    Culture  Setup Time   Final    IN BOTH AEROBIC AND ANAEROBIC BOTTLES GRAM POSITIVE COCCI CRITICAL VALUE NOTED.  VALUE IS CONSISTENT WITH PREVIOUSLY REPORTED AND CALLED VALUE. Performed at [facility], [address] [place], [place] [address]    Culture METHICILLIN RESISTANT STAPHYLOCOCCUS AUREUS (A)  Final   Report Status 05/13/2019 FINAL  Final   Organism ID, Bacteria METHICILLIN RESISTANT STAPHYLOCOCCUS AUREUS  Final      Susceptibility   Methicillin resistant staphylococcus aureus - MIC*    CIPROFLOXACIN >=8 RESISTANT Resistant     ERYTHROMYCIN >=8 RESISTANT Resistant     GENTAMICIN <=0.5 SENSITIVE Sensitive     OXACILLIN >=4 RESISTANT Resistant     TETRACYCLINE <=1 SENSITIVE Sensitive     VANCOMYCIN <=0.5 SENSITIVE Sensitive      TRIMETH/SULFA >=320 RESISTANT Resistant     CLINDAMYCIN <=0.25 SENSITIVE Sensitive     RIFAMPIN <=0.5 SENSITIVE Sensitive     Inducible Clindamycin NEGATIVE Sensitive     * METHICILLIN RESISTANT STAPHYLOCOCCUS AUREUS  Respiratory Panel by RT PCR (Flu A&B, Covid) - Nasopharyngeal Swab     Status: None   Collection Time: 06/02/2019  1:24 PM   Specimen: Nasopharyngeal Swab  Result Value Ref Range Status   SARS Coronavirus 2 by RT PCR NEGATIVE NEGATIVE Final    Comment: (NOTE) SARS-CoV-2 target nucleic acids are NOT DETECTED. The SARS-CoV-2 RNA is generally detectable in upper respiratoy specimens during the acute phase of infection. The lowest concentration of SARS-CoV-2 viral copies this assay can detect is 131 copies/mL. A negative result does not preclude SARS-Cov-2 infection and should not be used as the sole basis for treatment or other patient management decisions. A negative result may occur with  improper specimen collection/handling, submission of specimen other than nasopharyngeal swab, presence of viral mutation(s) within the areas targeted by this assay, and inadequate number of viral copies (<131 copies/mL). A negative result must be combined with clinical observations, patient history, and epidemiological  information. The expected result is Negative. Fact Sheet for Patients:  PinkCheek.be Fact Sheet for Healthcare Providers:  GravelBags.it This test is not yet ap proved or cleared by the [place] FDA and  has been authorized for detection and/or diagnosis of SARS-CoV-2 by FDA under an Emergency Use Authorization (EUA). This EUA will remain  in effect (meaning this test can be used) for the duration of the COVID-19 declaration under Section 564(b)(1) of the Act, 21 U.S.C. section 360bbb-3(b)(1), unless the authorization is terminated or revoked sooner.    Influenza A by PCR NEGATIVE NEGATIVE Final   Influenza B  by PCR NEGATIVE NEGATIVE Final    Comment: (NOTE) The Xpert Xpress SARS-CoV-2/FLU/RSV assay is intended as an aid in  the diagnosis of influenza from Nasopharyngeal swab specimens and  should not be used as a sole basis for treatment. Nasal washings and  aspirates are unacceptable for Xpert Xpress SARS-CoV-2/FLU/RSV  testing. Fact Sheet for Patients: PinkCheek.be Fact Sheet for Healthcare Providers: GravelBags.it This test is not yet approved or cleared by the [place] FDA and  has been authorized for detection and/or diagnosis of SARS-CoV-2 by  FDA under an Emergency Use Authorization (EUA). This EUA will remain  in effect (meaning this test can be used) for the duration of the  Covid-19 declaration under Section 564(b)(1) of the Act, 21  U.S.C. section 360bbb-3(b)(1), unless the authorization is  terminated or revoked. Performed at [facility], [address] [place], [place] [address]   MRSA PCR Screening     Status: None   Collection Time: 06/04/2019  6:27 PM   Specimen: Nasopharyngeal  Result Value Ref Range Status   MRSA by PCR NEGATIVE NEGATIVE Final    Comment:        The GeneXpert MRSA Assay (FDA approved for NASAL specimens only), is one component of a comprehensive MRSA colonization surveillance program. It is not intended to diagnose MRSA infection nor to guide or monitor treatment for MRSA infections. Performed at [facility], [address] [place], [place] [address]   Urine culture     Status: Abnormal   Collection Time: 05/13/2019 10:30 PM   Specimen: Urine, Clean Catch  Result Value Ref Range Status   Specimen Description   Final    URINE, CLEAN CATCH Performed at [facility], [address] [place], [place] [address]    Special Requests   Final    NONE Performed at [facility], [place], [place] [address]    Culture (A)  Final     >=100,000 COLONIES/mL KLEBSIELLA PNEUMONIAE >=100,000 COLONIES/mL VANCOMYCIN RESISTANT ENTEROCOCCUS    Report Status 05/14/2019 FINAL  Final   Organism ID, Bacteria KLEBSIELLA PNEUMONIAE (A)  Final   Organism ID, Bacteria VANCOMYCIN RESISTANT ENTEROCOCCUS (A)  Final      Susceptibility   Klebsiella pneumoniae - MIC*    AMPICILLIN >=32 RESISTANT Resistant     CEFAZOLIN <=4 SENSITIVE Sensitive     CEFTRIAXONE <=0.25 SENSITIVE Sensitive     CIPROFLOXACIN <=0.25 SENSITIVE Sensitive     GENTAMICIN <=1 SENSITIVE Sensitive     IMIPENEM 0.5 SENSITIVE Sensitive     NITROFURANTOIN 64 INTERMEDIATE Intermediate     TRIMETH/SULFA <=20 SENSITIVE Sensitive     AMPICILLIN/SULBACTAM 4 SENSITIVE Sensitive     PIP/TAZO <=4 SENSITIVE Sensitive     * >=100,000 COLONIES/mL KLEBSIELLA PNEUMONIAE   Vancomycin resistant enterococcus - MIC*    AMPICILLIN >=32 RESISTANT Resistant     NITROFURANTOIN  256 RESISTANT Resistant     VANCOMYCIN >=32 RESISTANT Resistant     LINEZOLID 2 SENSITIVE Sensitive     * >=100,000 COLONIES/mL VANCOMYCIN RESISTANT ENTEROCOCCUS  Cath Tip Culture     Status: Abnormal   Collection Time: 05/11/19  1:10 PM   Specimen: Catheter Tip; Other  Result Value Ref Range Status   Specimen Description   Final    CATH TIP Performed at [facility], [address] [place], [place] [address]    Special Requests   Final    NONE Performed at [facility], [address] [place], [place] [address]    Culture (A)  Final    >=100,000 COLONIES/mL METHICILLIN RESISTANT STAPHYLOCOCCUS AUREUS   Report Status 05/13/2019 FINAL  Final   Organism ID, Bacteria METHICILLIN RESISTANT STAPHYLOCOCCUS AUREUS (A)  Final      Susceptibility   Methicillin resistant staphylococcus aureus - MIC*    CIPROFLOXACIN >=8 RESISTANT Resistant     ERYTHROMYCIN >=8 RESISTANT Resistant     GENTAMICIN <=0.5 SENSITIVE Sensitive     OXACILLIN >=4 RESISTANT Resistant     TETRACYCLINE <=1 SENSITIVE  Sensitive     VANCOMYCIN 1 SENSITIVE Sensitive     TRIMETH/SULFA >=320 RESISTANT Resistant     CLINDAMYCIN <=0.25 SENSITIVE Sensitive     RIFAMPIN <=0.5 SENSITIVE Sensitive     Inducible Clindamycin NEGATIVE Sensitive     * >=100,000 COLONIES/mL METHICILLIN RESISTANT STAPHYLOCOCCUS AUREUS  Culture, blood (routine x 2)     Status: Abnormal   Collection Time: 05/11/19  4:27 PM   Specimen: BLOOD  Result Value Ref Range Status   Specimen Description   Final    BLOOD A-LINE Performed at [facility], [address] [place], [place] [address]    Special Requests   Final    BOTTLES DRAWN AEROBIC AND ANAEROBIC Blood Culture adequate volume Performed at [facility], [address] [place], [place] [address]    Culture  Setup Time   Final    GRAM POSITIVE COCCI ANAEROBIC BOTTLE ONLY CRITICAL VALUE NOTED.  VALUE IS CONSISTENT WITH PREVIOUSLY REPORTED AND CALLED VALUE. Performed at [facility], [address] [place], [place] [address]    Culture (A)  Final    STAPHYLOCOCCUS AUREUS SUSCEPTIBILITIES PERFORMED ON PREVIOUS CULTURE WITHIN THE LAST 5 DAYS. Performed at [facility], [place] [address], [place], [place] [address]    Report Status 05/08/2019 FINAL  Final  Culture, blood (routine x 2)     Status: Abnormal   Collection Time: 05/12/19  6:06 AM   Specimen: BLOOD  Result Value Ref Range Status   Specimen Description   Final    BLOOD BLOOD RIGHT HAND Performed at [facility], [address], [place], [place] [address]    Special Requests   Final    BOTTLES DRAWN AEROBIC AND ANAEROBIC Blood Culture adequate volume Performed at [facility], [address] [place], [place] [address]    Culture  Setup Time   Final    GRAM POSITIVE COCCI ANAEROBIC BOTTLE ONLY CRITICAL RESULT CALLED TO, READ BACK BY AND VERIFIED WITH: [place] ON 05/12/2019 BY MOSLEY,J Performed at [facility], [address]  [place], [place] [address]    Culture METHICILLIN RESISTANT STAPHYLOCOCCUS AUREUS (A)  Final   Report Status 06/02/2019 FINAL  Final   Organism ID, Bacteria METHICILLIN RESISTANT STAPHYLOCOCCUS AUREUS  Final      Susceptibility   Methicillin resistant  staphylococcus aureus - MIC*    CIPROFLOXACIN >=8 RESISTANT Resistant     ERYTHROMYCIN >=8 RESISTANT Resistant     GENTAMICIN <=0.5 SENSITIVE Sensitive     OXACILLIN >=4 RESISTANT Resistant     TETRACYCLINE <=1 SENSITIVE Sensitive     VANCOMYCIN 1 SENSITIVE Sensitive     TRIMETH/SULFA >=320 RESISTANT Resistant     CLINDAMYCIN <=0.25 SENSITIVE Sensitive     RIFAMPIN <=0.5 SENSITIVE Sensitive     Inducible Clindamycin NEGATIVE Sensitive     * METHICILLIN RESISTANT STAPHYLOCOCCUS AUREUS  CULTURE, BLOOD (ROUTINE X 2) w Reflex to ID Panel     Status: Abnormal   Collection Time: 05/13/19  2:20 PM   Specimen: BLOOD  Result Value Ref Range Status   Specimen Description   Final    BLOOD BLOOD RIGHT HAND Performed at [facility], [address], [place], [place] [address]    Special Requests   Final    BOTTLES DRAWN AEROBIC AND ANAEROBIC Blood Culture adequate volume Performed at [facility], [address], [place], [place] [address]    Culture  Setup Time   Final    GRAM POSITIVE COCCI ANAEROBIC BOTTLE ONLY CRITICAL RESULT CALLED TO, READ BACK BY AND VERIFIED WITH: [facility] KATSOUDAS AT 3710 05/14/19 SDR Performed at [facility], [address] [place], [place] [address]    Culture (A)  Final    STAPHYLOCOCCUS AUREUS SUSCEPTIBILITIES PERFORMED ON PREVIOUS CULTURE WITHIN THE LAST 5 DAYS. Performed at [facility], [place] [address], [place], [place] [address]    Report Status 05/16/2019 FINAL  Final  CULTURE, BLOOD (ROUTINE X 2) w Reflex to ID Panel     Status: None (Preliminary result)   Collection Time: 05/13/19  3:39 PM   Specimen: BLOOD  Result Value Ref Range Status   Specimen Description BLOOD  BLOOD LEFT HAND  Final   Special Requests   Final    BOTTLES DRAWN AEROBIC ONLY Blood Culture adequate volume   Culture   Final    NO GROWTH 2 DAYS Performed at [facility], [address], [place], [place] [address]    Report Status PENDING  Incomplete  CULTURE, BLOOD (ROUTINE X 2) w Reflex to ID Panel     Status: None (Preliminary result)   Collection Time: 05/14/19  3:33 PM   Specimen: BLOOD  Result Value Ref Range Status   Specimen Description BLOOD BLOOD RIGHT HAND  Final   Special Requests   Final    BOTTLES DRAWN AEROBIC AND ANAEROBIC Blood Culture adequate volume   Culture  Setup Time   Final    GRAM POSITIVE COCCI ANAEROBIC BOTTLE ONLY CRITICAL VALUE NOTED.  VALUE IS CONSISTENT WITH PREVIOUSLY REPORTED AND CALLED VALUE. Performed at [facility], [address] [place], [place] [address]    Culture [facility] POSITIVE COCCI  Final   Report Status PENDING  Incomplete  Cath Tip Culture     Status: None (Preliminary result)   Collection Time: 05/14/19  4:24 PM   Specimen: Catheter Tip; Other  Result Value Ref Range Status   Specimen Description   Final    CATH TIP Performed at [facility], [address] [place], [place] [address]    Special Requests   Final    NONE Performed at [facility], [address], [place], [place] [address]    Culture   Final    NO GROWTH < 24 HOURS Performed at [facility], [address], [place], [place] [address]    Report Status PENDING  Incomplete  CULTURE, BLOOD (ROUTINE X 2) w Reflex to ID Panel     Status: Abnormal (Preliminary result)   Collection Time: 05/14/19  6:25 PM   Specimen: BLOOD  Result Value Ref Range Status   Specimen Description   Final    BLOOD BLOOD LEFT HAND Performed at [facility], [address], [place], [place] [address]    Special Requests   Final    BOTTLES DRAWN AEROBIC AND ANAEROBIC Blood Culture results may not be optimal due to an inadequate  volume of blood received in culture bottles Performed at [facility], [address], [place], [place] [address]    Culture  Setup Time   Final    GRAM POSITIVE COCCI ANAEROBIC BOTTLE ONLY CRITICAL VALUE NOTED.  VALUE IS CONSISTENT WITH PREVIOUSLY REPORTED AND CALLED VALUE. [place] Performed at [facility], [address] [place], [place] [address]    Culture (A)  Final    STAPHYLOCOCCUS AUREUS SUSCEPTIBILITIES PERFORMED ON PREVIOUS CULTURE WITHIN THE LAST 5 DAYS. Performed at [facility], [place] [address] [place], [place] [address]    Report Status PENDING  Incomplete    Coagulation Studies: No results for input(s): LABPROT, INR in the last 72 hours.  Urinalysis: No results for input(s): COLORURINE, LABSPEC, PHURINE, GLUCOSEU, HGBUR, BILIRUBINUR, KETONESUR, PROTEINUR, UROBILINOGEN, NITRITE, LEUKOCYTESUR in the last 72 hours.  Invalid input(s): APPERANCEUR    Imaging: PERIPHERAL VASCULAR CATHETERIZATION  Result Date: 05/25/2019 See op note    Medications:   . DAPTOmycin (CUBICIN)  IV Stopped (05/14/19 2224)  . levETIRAcetam 500 mg (05/30/2019 2049)   . amLODipine  10 mg Oral Daily  . atorvastatin  10 mg Oral q1800  . carvedilol  25 mg Oral BID WC  . Chlorhexidine Gluconate Cloth  6 each Topical Daily  . insulin aspart  0-9 Units Subcutaneous Q4H  . midodrine  5 mg Oral Q M,W,F  . pantoprazole (PROTONIX) IV  40 mg Intravenous QHS  . vancomycin variable dose per unstable renal function (pharmacist dosing)   Does not apply See admin instructions     Assessment/ Plan:  [name] is a 74 y.o. black female [name] is a 74 y.o. black female with end stage renal disease on hemodialysis, CVA with left hemiparesis, hypertension, hyperlipidemia, GERD, glaucoma, diabetes mellitus type II, coronary artery disease, gout, seizure disorder who was admitted to [facility] on 05/20/2019 for Hypokalemia [E87.6] Shock ([place]) [R57.9] ESRD (end stage renal  disease) ([place]) [N18.6] Hypotension, unspecified hypotension type [I95.9] Anemia, unspecified type [D64.9]  [facility] MWF RIJ permcath 63.5kg  1. End Stage Renal Disease: last hemodialysis treatment was Friday 4/2. No acute indication for dialysis.  Appreciate vascular input - temp HD catheter to be removed  - Plan on permcath placement when blood culture free.  - For now, will monitor daily for dialysis need and place temp HD catheter and remove when necessary.   2. Hypotension: with MRSA bacteremia/sepsis weaned off vasopressors: Persistent positive blood cultures from 4/7. Meets [facility] criteria for endocarditis.  - Continue vancomycin and daptomycin. Appreciate ID input. - Check blood cultures.   3. Anemia with chronic kidney disease: with iron deficiency and folate deficiency. Subcu EPO on 4/7.  Hemoglobin 8.3  5. Secondary Hyperparathyroidism: with hypophosphatemia. Off binders.   6. 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   7. Thrombocytopenia: acute drop most likely due to infection. Improving. Platelet count was 247K on 3/22. Continue to monitor.     LOS: 6 Crystal Vargas 4/9/20219:15 AM

## 2019-05-16 NOTE — Progress Notes (Signed)
PROGRESS NOTE    Crystal Vargas  LNL:[id number] DOB: [birth year] DOA: 05/17/2019 PCP: Marisa Hua, MD       Assessment & Plan:   Principal Problem:   MRSA bacteremia Active Problems:   Diabetes ([place])   Essential hypertension   ESRD on dialysis ([place])   Shock ([place])   Status post CVA   Schatzki's ring   MRSA bacteremia: secondary infected HD catheter which has been removed w/ MRSA bacteremia. S/p placement of temporary HD catheter. Continue on IV daptomycin. Blood cx 05/14/19 are still growing MRSA  Septic shock: secondary to above. Continue on IV abxs   ESRD: on HD. Temp catheter removed secondary to recurrent MRSA bacteremia. HD as per nephro  Thrombocytopenia: etiology unclear, possibly secondary to infection stated above. 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 No need for a transfusion at this time. Will continue to monitor   Severe protein calorie malnutrition: w/ poor po intake due to chronic issues w/ dysphagia.   Weakness: w/ left hemiparesis. PT/OT consulted   DM2: will continue on SSI w/ accuchecks  HLD: will continue on statin   HTN: will restart carvedilol, amlodipine   GERD: continue on PPI   CAD: will restart carvedilol, amlodipine & statin.    DVT prophylaxis: SCDs Code Status:  Full  Family Communication: called pt's daughter, [name], but no answer  Disposition Plan: depends on PT/OT recs    Consultants:   ID  Vascular surgery  ICU  nephro   Procedures:    Antimicrobials: daptomycin    Subjective: Pt c/o fatigue  Objective: Vitals:   05/16/19 0300 05/16/19 0400 05/16/19 0500 05/16/19 0600  BP: (!) 194/129 (!) 177/62 (!) 159/54 (!) 171/87  Pulse: 70 73 64 64  Resp: (!) 25 (!) 21 18 19   Temp:  98.6 F (37 C)    TempSrc:  Oral    SpO2: 94% 95% 96% 97%  Weight:   68.2 kg   Height:        Intake/Output Summary (Last 24 hours) at 05/16/2019 0816 Last data filed at 06/02/2019 1810 Gross per 24 hour  Intake 0  ml  Output 0 ml  Net 0 ml   Filed Weights   05/14/19 0500 05/17/2019 0500 05/16/19 0500  Weight: 66.2 kg 66.3 kg 68.2 kg    Examination:  General exam: Appears calm and comfortable. Frail appearing. Appears older than stated age Respiratory system: diminished breath sounds b/l . Cardiovascular system: S1 & S2 +. No rubs, gallops or clicks.  Gastrointestinal system: Abdomen is nondistended, soft and nontender. Normal bowel sounds heard. Central nervous system: Lethargic. Moves all 4 extremities  Psychiatry: Judgement and insight appear abnormal. Flat mood and affect    Data Reviewed: I have personally reviewed following labs and imaging studies  CBC: Recent Labs  Lab 05/13/2019 1324 05/11/19 0611 05/12/19 0607 05/13/19 0556 05/14/19 1051 05/14/2019 0757 05/16/19 0327  WBC 9.4   < > 9.9 5.1 6.5 7.7 9.5  NEUTROABS 7.5  --   --   --   --   --   --   HGB 6.9*   < > 9.5* 7.8* 8.3* 8.9* 8.3*  HCT 21.4*   < > 27.6* 23.6* 22.2* 25.5* 23.7*  MCV 103.4*   < > 96.2 96.3 86.4 93.4 90.5  PLT 82*   < > 80* 38* 59* 51* 70*   < > = values in this interval not displayed.   Basic Metabolic Panel: Recent Labs  Lab  05/16/2019 1324 05/30/2019 1625 05/17/2019 2135 06/01/2019 2135 05/11/19 0611 05/11/19 1627 05/12/19 0502 05/13/19 0556 05/14/19 1051 05/25/2019 0633 05/16/19 0327  NA   < >  --  136   < > 134*  --  135 138 143 141 142  K   < >  --  3.5   < > 3.6   < > 4.2 4.1 4.3 3.7 3.5  CL   < >  --  103   < > 100  --  103 105 112* 110 108  CO2   < >  --  15*   < > 15*  --  21* 16* 16* 19* 26  GLUCOSE   < >  --  202*   < > 261*  --  157* 140* 123* 117* 269*  BUN   < >  --  25*   < > 28*  --  30* 38* 44* 48* 24*  CREATININE   < >  --  2.04*   < > 2.13*  --  2.47* 3.01* 3.54* 3.76* 2.16*  CALCIUM   < >  --  7.9*   < > 7.8*  --  8.5* 9.1 9.4 9.3 8.8*  MG  --  1.6*  --   --  2.3  --  2.0  --   --   --   --   PHOS  --   --  1.6*  --  4.6  --  3.6  --  3.1  --  1.3*   < > = values in this interval  not displayed.   GFR: Estimated Creatinine Clearance: 20 mL/min (A) (by C-G formula based on SCr of 2.16 mg/dL (H)). Liver Function Tests: Recent Labs  Lab 06/06/2019 1324 05/14/19 1051 05/16/19 0327  AST 26  --   --   ALT 14  --   --   ALKPHOS 55  --   --   BILITOT 1.5*  --   --   PROT 3.9*  --   --   ALBUMIN 1.3* 2.7* 2.3*   Recent Labs  Lab 05/11/2019 1324  LIPASE <10*   Recent Labs  Lab 06/05/2019 1234  AMMONIA 13   Coagulation Profile: No results for input(s): INR, PROTIME in the last 168 hours. Cardiac Enzymes: Recent Labs  Lab 05/11/2019 1324 05/17/2019 0633  CKTOTAL 17* 10*   BNP (last 3 results) No results for input(s): PROBNP in the last 8760 hours. HbA1C: No results for input(s): HGBA1C in the last 72 hours. CBG: Recent Labs  Lab 06/02/2019 1836 06/06/2019 1952 06/04/2019 2322 05/16/19 0354 05/16/19 0734  GLUCAP 92 86 184* 245* 166*   Lipid Profile: No results for input(s): CHOL, HDL, LDLCALC, TRIG, CHOLHDL, LDLDIRECT in the last 72 hours. Thyroid Function Tests: No results for input(s): TSH, T4TOTAL, FREET4, T3FREE, THYROIDAB in the last 72 hours. Anemia Panel: No results for input(s): VITAMINB12, FOLATE, FERRITIN, TIBC, IRON, RETICCTPCT in the last 72 hours. Sepsis Labs: Recent Labs  Lab 06/01/2019 1324 05/12/2019 2306 05/11/19 0611 05/12/19 0502  PROCALCITON 62.53  --  55.12 45.92  LATICACIDVEN 1.9 1.5 1.4  --     Recent Results (from the past 240 hour(s))  Blood culture (routine x 2)     Status: Abnormal   Collection Time: 06/02/2019 12:34 PM   Specimen: BLOOD  Result Value Ref Range Status   Specimen Description   Final    BLOOD R UP ARM Performed at [facility], [address]  [place], [place] [address]    Special Requests   Final    BOTTLES DRAWN AEROBIC AND ANAEROBIC Blood Culture adequate volume Performed at [facility], [address] [place], [place] [address]    Culture  Setup Time   Final    Organism ID to  follow IN BOTH AEROBIC AND ANAEROBIC BOTTLES GRAM POSITIVE COCCI CRITICAL RESULT CALLED TO, READ BACK BY AND VERIFIED WITH: Gerty ON 05/11/19 AT 0210 [facility] Performed at [facility], [address] [place], [place] [address]    Culture (A)  Final    STAPHYLOCOCCUS AUREUS SUSCEPTIBILITIES PERFORMED ON PREVIOUS CULTURE WITHIN THE LAST 5 DAYS. Performed at [facility], [place] [address] [place], [place] [address]    Report Status 05/13/2019 FINAL  Final  Blood Culture ID Panel (Reflexed)     Status: Abnormal   Collection Time: 06/01/2019 12:34 PM  Result Value Ref Range Status   Enterococcus species NOT DETECTED NOT DETECTED Final   Listeria monocytogenes NOT DETECTED NOT DETECTED Final   Staphylococcus species DETECTED (A) NOT DETECTED Final    Comment: CRITICAL RESULT CALLED TO, READ BACK BY AND VERIFIED WITH: SCOTT HALL ON 05/11/19 AT 0210 [facility]    Staphylococcus aureus (BCID) DETECTED (A) NOT DETECTED Final    Comment: Methicillin (oxacillin)-resistant Staphylococcus aureus (MRSA). MRSA is predictably resistant to beta-lactam antibiotics (except ceftaroline). Preferred therapy is vancomycin unless clinically contraindicated. Patient requires contact precautions if  hospitalized. CRITICAL RESULT CALLED TO, READ BACK BY AND VERIFIED WITH: SCOTT HALL ON 05/11/19 AT 0210 [facility]    Methicillin resistance DETECTED (A) NOT DETECTED Final    Comment: CRITICAL RESULT CALLED TO, READ BACK BY AND VERIFIED WITH: SCOTT HALL ON 05/11/19 AT 0210 [place]    Streptococcus species NOT DETECTED NOT DETECTED Final   Streptococcus agalactiae NOT DETECTED NOT DETECTED Final   Streptococcus pneumoniae NOT DETECTED NOT DETECTED Final   Streptococcus pyogenes NOT DETECTED NOT DETECTED Final   Acinetobacter baumannii NOT DETECTED NOT DETECTED Final   Enterobacteriaceae species NOT DETECTED NOT DETECTED Final   Enterobacter cloacae complex NOT DETECTED NOT DETECTED Final   Escherichia coli NOT DETECTED  NOT DETECTED Final   Klebsiella oxytoca NOT DETECTED NOT DETECTED Final   Klebsiella pneumoniae NOT DETECTED NOT DETECTED Final   Proteus species NOT DETECTED NOT DETECTED Final   Serratia marcescens NOT DETECTED NOT DETECTED Final   Haemophilus influenzae NOT DETECTED NOT DETECTED Final   Neisseria meningitidis NOT DETECTED NOT DETECTED Final   Pseudomonas aeruginosa NOT DETECTED NOT DETECTED Final   Candida albicans NOT DETECTED NOT DETECTED Final   Candida glabrata NOT DETECTED NOT DETECTED Final   Candida krusei NOT DETECTED NOT DETECTED Final   Candida parapsilosis NOT DETECTED NOT DETECTED Final   Candida tropicalis NOT DETECTED NOT DETECTED Final    Comment: Performed at [facility], [address] [place], [place] [address]  Blood culture (routine x 2)     Status: Abnormal   Collection Time: 05/31/2019  1:24 PM   Specimen: BLOOD  Result Value Ref Range Status   Specimen Description   Final    BLOOD R LATREAL BICEP Performed at [facility], [address] [place], [place] [address]    Special Requests   Final    BOTTLES DRAWN AEROBIC AND ANAEROBIC Blood Culture adequate volume Performed at [facility], [address], [place], [place] [address]    Culture  Setup Time   Final    IN  BOTH AEROBIC AND ANAEROBIC BOTTLES GRAM POSITIVE COCCI CRITICAL VALUE NOTED.  VALUE IS CONSISTENT WITH PREVIOUSLY REPORTED AND CALLED VALUE. Performed at [facility], [address] [place], [place] [address]    Culture METHICILLIN RESISTANT STAPHYLOCOCCUS AUREUS (A)  Final   Report Status 05/13/2019 FINAL  Final   Organism ID, Bacteria METHICILLIN RESISTANT STAPHYLOCOCCUS AUREUS  Final      Susceptibility   Methicillin resistant staphylococcus aureus - MIC*    CIPROFLOXACIN >=8 RESISTANT Resistant     ERYTHROMYCIN >=8 RESISTANT Resistant     GENTAMICIN <=0.5 SENSITIVE Sensitive     OXACILLIN >=4 RESISTANT Resistant     TETRACYCLINE <=1 SENSITIVE  Sensitive     VANCOMYCIN <=0.5 SENSITIVE Sensitive     TRIMETH/SULFA >=320 RESISTANT Resistant     CLINDAMYCIN <=0.25 SENSITIVE Sensitive     RIFAMPIN <=0.5 SENSITIVE Sensitive     Inducible Clindamycin NEGATIVE Sensitive     * METHICILLIN RESISTANT STAPHYLOCOCCUS AUREUS  Respiratory Panel by RT PCR (Flu A&B, Covid) - Nasopharyngeal Swab     Status: None   Collection Time: 06/02/2019  1:24 PM   Specimen: Nasopharyngeal Swab  Result Value Ref Range Status   SARS Coronavirus 2 by RT PCR NEGATIVE NEGATIVE Final    Comment: (NOTE) SARS-CoV-2 target nucleic acids are NOT DETECTED. The SARS-CoV-2 RNA is generally detectable in upper respiratoy specimens during the acute phase of infection. The lowest concentration of SARS-CoV-2 viral copies this assay can detect is 131 copies/mL. A negative result does not preclude SARS-Cov-2 infection and should not be used as the sole basis for treatment or other patient management decisions. A negative result may occur with  improper specimen collection/handling, submission of specimen other than nasopharyngeal swab, presence of viral mutation(s) within the areas targeted by this assay, and inadequate number of viral copies (<131 copies/mL). A negative result must be combined with clinical observations, patient history, and epidemiological information. The expected result is Negative. Fact Sheet for Patients:  PinkCheek.be Fact Sheet for Healthcare Providers:  GravelBags.it This test is not yet ap proved or cleared by the [place] FDA and  has been authorized for detection and/or diagnosis of SARS-CoV-2 by FDA under an Emergency Use Authorization (EUA). This EUA will remain  in effect (meaning this test can be used) for the duration of the COVID-19 declaration under Section 564(b)(1) of the Act, 21 U.S.C. section 360bbb-3(b)(1), unless the authorization is terminated or revoked sooner.     Influenza A by PCR NEGATIVE NEGATIVE Final   Influenza B by PCR NEGATIVE NEGATIVE Final    Comment: (NOTE) The Xpert Xpress SARS-CoV-2/FLU/RSV assay is intended as an aid in  the diagnosis of influenza from Nasopharyngeal swab specimens and  should not be used as a sole basis for treatment. Nasal washings and  aspirates are unacceptable for Xpert Xpress SARS-CoV-2/FLU/RSV  testing. Fact Sheet for Patients: PinkCheek.be Fact Sheet for Healthcare Providers: GravelBags.it This test is not yet approved or cleared by the [place] FDA and  has been authorized for detection and/or diagnosis of SARS-CoV-2 by  FDA under an Emergency Use Authorization (EUA). This EUA will remain  in effect (meaning this test can be used) for the duration of the  Covid-19 declaration under Section 564(b)(1) of the Act, 21  U.S.C. section 360bbb-3(b)(1), unless the authorization is  terminated or revoked. Performed at [facility], [address] [place],  [address]   MRSA PCR Screening     Status: None   Collection Time:  06/04/2019  6:27 PM   Specimen: Nasopharyngeal  Result Value Ref Range Status   MRSA by PCR NEGATIVE NEGATIVE Final    Comment:        The GeneXpert MRSA Assay (FDA approved for NASAL specimens only), is one component of a comprehensive MRSA colonization surveillance program. It is not intended to diagnose MRSA infection nor to guide or monitor treatment for MRSA infections. Performed at [facility], [address] [place], [place] [address]   Urine culture     Status: Abnormal   Collection Time: 05/13/2019 10:30 PM   Specimen: Urine, Clean Catch  Result Value Ref Range Status   Specimen Description   Final    URINE, CLEAN CATCH Performed at [facility], [address], [place], [place] [address]    Special Requests   Final    NONE Performed at [facility], [place], [place] [address]    Culture (A)  Final    >=100,000 COLONIES/mL KLEBSIELLA PNEUMONIAE >=100,000 COLONIES/mL VANCOMYCIN RESISTANT ENTEROCOCCUS    Report Status 05/14/2019 FINAL  Final   Organism ID, Bacteria KLEBSIELLA PNEUMONIAE (A)  Final   Organism ID, Bacteria VANCOMYCIN RESISTANT ENTEROCOCCUS (A)  Final      Susceptibility   Klebsiella pneumoniae - MIC*    AMPICILLIN >=32 RESISTANT Resistant     CEFAZOLIN <=4 SENSITIVE Sensitive     CEFTRIAXONE <=0.25 SENSITIVE Sensitive     CIPROFLOXACIN <=0.25 SENSITIVE Sensitive     GENTAMICIN <=1 SENSITIVE Sensitive     IMIPENEM 0.5 SENSITIVE Sensitive     NITROFURANTOIN 64 INTERMEDIATE Intermediate     TRIMETH/SULFA <=20 SENSITIVE Sensitive     AMPICILLIN/SULBACTAM 4 SENSITIVE Sensitive     PIP/TAZO <=4 SENSITIVE Sensitive     * >=100,000 COLONIES/mL KLEBSIELLA PNEUMONIAE   Vancomycin resistant enterococcus - MIC*    AMPICILLIN >=32 RESISTANT Resistant     NITROFURANTOIN 256 RESISTANT Resistant     VANCOMYCIN >=32 RESISTANT Resistant     LINEZOLID 2 SENSITIVE Sensitive     * >=100,000 COLONIES/mL VANCOMYCIN RESISTANT ENTEROCOCCUS  Cath Tip Culture     Status: Abnormal   Collection Time: 05/11/19  1:10 PM   Specimen: Catheter Tip; Other  Result Value Ref Range Status   Specimen Description   Final    CATH TIP Performed at [facility], [address] [place], [place] [address]    Special Requests   Final    NONE Performed at [facility], [address] [place], [place] [address]    Culture (A)  Final    >=100,000 COLONIES/mL METHICILLIN RESISTANT STAPHYLOCOCCUS AUREUS   Report Status 05/13/2019 FINAL  Final   Organism ID, Bacteria METHICILLIN RESISTANT STAPHYLOCOCCUS AUREUS (A)  Final      Susceptibility   Methicillin resistant staphylococcus aureus - MIC*    CIPROFLOXACIN >=8 RESISTANT Resistant     ERYTHROMYCIN >=8 RESISTANT Resistant     GENTAMICIN <=0.5 SENSITIVE Sensitive     OXACILLIN  >=4 RESISTANT Resistant     TETRACYCLINE <=1 SENSITIVE Sensitive     VANCOMYCIN 1 SENSITIVE Sensitive     TRIMETH/SULFA >=320 RESISTANT Resistant     CLINDAMYCIN <=0.25 SENSITIVE Sensitive     RIFAMPIN <=0.5 SENSITIVE Sensitive     Inducible Clindamycin NEGATIVE Sensitive     * >=100,000 COLONIES/mL METHICILLIN RESISTANT STAPHYLOCOCCUS AUREUS  Culture, blood (routine x 2)     Status: Abnormal   Collection Time: 05/11/19  4:27 PM   Specimen:  BLOOD  Result Value Ref Range Status   Specimen Description   Final    BLOOD A-LINE Performed at [facility], [address] [place], [place] [address]    Special Requests   Final    BOTTLES DRAWN AEROBIC AND ANAEROBIC Blood Culture adequate volume Performed at [facility], [address] [place], [place] [address]    Culture  Setup Time   Final    GRAM POSITIVE COCCI ANAEROBIC BOTTLE ONLY CRITICAL VALUE NOTED.  VALUE IS CONSISTENT WITH PREVIOUSLY REPORTED AND CALLED VALUE. Performed at [facility], [address] [place], [place] [address]    Culture (A)  Final    STAPHYLOCOCCUS AUREUS SUSCEPTIBILITIES PERFORMED ON PREVIOUS CULTURE WITHIN THE LAST 5 DAYS. Performed at [facility], [place] [address], [place], [place] [address]    Report Status 05/17/2019 FINAL  Final  Culture, blood (routine x 2)     Status: Abnormal   Collection Time: 05/12/19  6:06 AM   Specimen: BLOOD  Result Value Ref Range Status   Specimen Description   Final    BLOOD BLOOD RIGHT HAND Performed at [facility], [address], [place], [place] [address]    Special Requests   Final    BOTTLES DRAWN AEROBIC AND ANAEROBIC Blood Culture adequate volume Performed at [facility], [address] [place], [place] [address]    Culture  Setup Time   Final    GRAM POSITIVE COCCI ANAEROBIC BOTTLE ONLY CRITICAL RESULT CALLED TO, READ BACK BY AND VERIFIED WITH: [place] ON 05/12/2019 BY  MOSLEY,J Performed at [facility], [address] [place], [place] [address]    Culture METHICILLIN RESISTANT STAPHYLOCOCCUS AUREUS (A)  Final   Report Status 05/14/2019 FINAL  Final   Organism ID, Bacteria METHICILLIN RESISTANT STAPHYLOCOCCUS AUREUS  Final      Susceptibility   Methicillin resistant staphylococcus aureus - MIC*    CIPROFLOXACIN >=8 RESISTANT Resistant     ERYTHROMYCIN >=8 RESISTANT Resistant     GENTAMICIN <=0.5 SENSITIVE Sensitive     OXACILLIN >=4 RESISTANT Resistant     TETRACYCLINE <=1 SENSITIVE Sensitive     VANCOMYCIN 1 SENSITIVE Sensitive     TRIMETH/SULFA >=320 RESISTANT Resistant     CLINDAMYCIN <=0.25 SENSITIVE Sensitive     RIFAMPIN <=0.5 SENSITIVE Sensitive     Inducible Clindamycin NEGATIVE Sensitive     * METHICILLIN RESISTANT STAPHYLOCOCCUS AUREUS  CULTURE, BLOOD (ROUTINE X 2) w Reflex to ID Panel     Status: Abnormal (Preliminary result)   Collection Time: 05/13/19  2:20 PM   Specimen: BLOOD  Result Value Ref Range Status   Specimen Description   Final    BLOOD BLOOD RIGHT HAND Performed at [facility], [address], [place], [place] [address]    Special Requests   Final    BOTTLES DRAWN AEROBIC AND ANAEROBIC Blood Culture adequate volume Performed at [facility], [address] [place], [place] [address]    Culture  Setup Time   Final    GRAM POSITIVE COCCI ANAEROBIC BOTTLE ONLY CRITICAL RESULT CALLED TO, READ BACK BY AND VERIFIED WITH: [facility] KATSOUDAS AT 8185 05/14/19 SDR Performed at [facility], [address] [place], [place] [address]    Culture (A)  Final    STAPHYLOCOCCUS AUREUS SUSCEPTIBILITIES PERFORMED ON PREVIOUS CULTURE WITHIN THE LAST 5 DAYS. Performed at [facility], [place] [address] [place], [place] [address]  Report Status PENDING  Incomplete  CULTURE, BLOOD (ROUTINE X 2) w Reflex to ID Panel     Status: None (Preliminary result)   Collection Time: 05/13/19  3:39  PM   Specimen: BLOOD  Result Value Ref Range Status   Specimen Description BLOOD BLOOD LEFT HAND  Final   Special Requests   Final    BOTTLES DRAWN AEROBIC ONLY Blood Culture adequate volume   Culture   Final    NO GROWTH 2 DAYS Performed at [facility], [address] [place], [place] [address]    Report Status PENDING  Incomplete  CULTURE, BLOOD (ROUTINE X 2) w Reflex to ID Panel     Status: None (Preliminary result)   Collection Time: 05/14/19  3:33 PM   Specimen: BLOOD  Result Value Ref Range Status   Specimen Description BLOOD BLOOD RIGHT HAND  Final   Special Requests   Final    BOTTLES DRAWN AEROBIC AND ANAEROBIC Blood Culture adequate volume   Culture  Setup Time   Final    GRAM POSITIVE COCCI ANAEROBIC BOTTLE ONLY CRITICAL VALUE NOTED.  VALUE IS CONSISTENT WITH PREVIOUSLY REPORTED AND CALLED VALUE. Performed at [facility], [address] [place], [place] [address]    Culture [facility] POSITIVE COCCI  Final   Report Status PENDING  Incomplete  Cath Tip Culture     Status: None (Preliminary result)   Collection Time: 05/14/19  4:24 PM   Specimen: Catheter Tip; Other  Result Value Ref Range Status   Specimen Description   Final    CATH TIP Performed at [facility], [address], [place], [place] [address]    Special Requests   Final    NONE Performed at [facility], [address] [place], [place] [address]    Culture   Final    NO GROWTH < 24 HOURS Performed at [facility], [place] [address] [place], [place] [address]    Report Status PENDING  Incomplete  CULTURE, BLOOD (ROUTINE X 2) w Reflex to ID Panel     Status: None   Collection Time: 05/14/19  6:25 PM   Specimen: BLOOD  Result Value Ref Range Status   Specimen Description BLOOD BLOOD LEFT HAND  Final   Special Requests   Final    BOTTLES DRAWN AEROBIC AND ANAEROBIC Blood Culture results may not be optimal due to an inadequate volume of blood received in  culture bottles   Culture  Setup Time   Final    GRAM POSITIVE COCCI ANAEROBIC BOTTLE ONLY CRITICAL VALUE NOTED.  VALUE IS CONSISTENT WITH PREVIOUSLY REPORTED AND CALLED VALUE. [facility] Performed at [facility], [address] [place], [place] [address]    Culture [facility] POSITIVE COCCI  Final   Report Status 05/09/2019 FINAL  Final         Radiology Studies: PERIPHERAL VASCULAR CATHETERIZATION  Result Date: 05/30/2019 See op note       Scheduled Meds: . Chlorhexidine Gluconate Cloth  6 each Topical Daily  . insulin aspart  0-9 Units Subcutaneous Q4H  . midodrine  5 mg Oral TID WC  . pantoprazole (PROTONIX) IV  40 mg Intravenous QHS  . vancomycin variable dose per unstable renal function (pharmacist dosing)   Does not apply See admin instructions   Continuous Infusions: . DAPTOmycin (CUBICIN)  IV Stopped (05/14/19 2224)  . levETIRAcetam 500 mg (05/16/2019 2049)     LOS: 6 days    Time spent:  30 mins     Wyvonnia Dusky, MD [facility] Pager [phone]  If 7PM-7AM, please contact night-coverage [email or web address] 05/16/2019, 8:16 AM

## 2019-05-16 NOTE — Progress Notes (Signed)
Pt in no acute distress at this time, VSS. Daughter at bedside updated on POC by MD over the phone. Poor appetite, eats with total assist.

## 2019-05-17 DIAGNOSIS — E43 Unspecified severe protein-calorie malnutrition: Secondary | ICD-10-CM | POA: Diagnosis not present

## 2019-05-17 DIAGNOSIS — R7881 Bacteremia: Secondary | ICD-10-CM | POA: Diagnosis not present

## 2019-05-17 DIAGNOSIS — N186 End stage renal disease: Secondary | ICD-10-CM | POA: Diagnosis not present

## 2019-05-17 DIAGNOSIS — B9562 Methicillin resistant Staphylococcus aureus infection as the cause of diseases classified elsewhere: Secondary | ICD-10-CM | POA: Diagnosis not present

## 2019-05-17 LAB — GLUCOSE, CAPILLARY
Glucose-Capillary: 85 mg/dL (ref 70–99)
Glucose-Capillary: 195 mg/dL — ABNORMAL HIGH (ref 70–99)
Glucose-Capillary: 62 mg/dL — ABNORMAL LOW (ref 70–99)
Glucose-Capillary: 162 mg/dL — ABNORMAL HIGH (ref 70–99)
Glucose-Capillary: 183 mg/dL — ABNORMAL HIGH (ref 70–99)
Glucose-Capillary: 277 mg/dL — ABNORMAL HIGH (ref 70–99)

## 2019-05-17 LAB — CBC
HCT: 23.1 % — ABNORMAL LOW (ref 36.0–46.0)
Hemoglobin: 7.8 g/dL — ABNORMAL LOW (ref 12.0–15.0)
MCH: 31.8 pg (ref 26.0–34.0)
MCHC: 33.8 g/dL (ref 30.0–36.0)
MCV: 94.3 fL (ref 80.0–100.0)
Platelets: 48 10*3/uL — ABNORMAL LOW (ref 150–400)
RBC: 2.45 MIL/uL — ABNORMAL LOW (ref 3.87–5.11)
RDW: 18 % — ABNORMAL HIGH (ref 11.5–15.5)
WBC: 9.2 10*3/uL (ref 4.0–10.5)
nRBC: 0 % (ref 0.0–0.2)

## 2019-05-17 LAB — COMPREHENSIVE METABOLIC PANEL
ALT: 19 U/L (ref 0–44)
AST: 22 U/L (ref 15–41)
Albumin: 2.1 g/dL — ABNORMAL LOW (ref 3.5–5.0)
Alkaline Phosphatase: 78 U/L (ref 38–126)
Anion gap: 8 (ref 5–15)
BUN: 34 mg/dL — ABNORMAL HIGH (ref 8–23)
CO2: 24 mmol/L (ref 22–32)
Calcium: 8.7 mg/dL — ABNORMAL LOW (ref 8.9–10.3)
Chloride: 110 mmol/L (ref 98–111)
Creatinine, Ser: 2.66 mg/dL — ABNORMAL HIGH (ref 0.44–1.00)
GFR calc Af Amer: 19 mL/min — ABNORMAL LOW (ref >60)
GFR calc non Af Amer: 17 mL/min — ABNORMAL LOW (ref >60)
Glucose, Bld: 96 mg/dL (ref 70–99)
Potassium: 3.5 mmol/L (ref 3.5–5.1)
Sodium: 142 mmol/L (ref 135–145)
Total Bilirubin: 1.7 mg/dL — ABNORMAL HIGH (ref 0.3–1.2)
Total Protein: 4.9 g/dL — ABNORMAL LOW (ref 6.5–8.1)

## 2019-05-17 LAB — CULTURE, BLOOD (ROUTINE X 2)

## 2019-05-17 LAB — CATH TIP CULTURE: Culture: NO GROWTH

## 2019-05-17 MED ORDER — DEXTROSE 50 % IV SOLN
INTRAVENOUS | Status: AC
  Administered 2019-05-17: 25 mL
  Filled 2019-05-17: qty 50

## 2019-05-17 MED ORDER — ENSURE ENLIVE PO LIQD
237.0000 mL | Freq: Three times a day (TID) | ORAL | Status: DC
  Administered 2019-05-17 – 2019-05-18 (×4): 237 mL via ORAL

## 2019-05-17 NOTE — Progress Notes (Signed)
Report called to receiving [id number]. Patient with no complaints at the current time. Daughter at bedside. Will transfer in bed.

## 2019-05-17 NOTE — Progress Notes (Signed)
PROGRESS NOTE    Crystal Vargas  KXF:[id number] DOB: [birth year] DOA: 05/16/2019 PCP: Marisa Hua, MD       Assessment & Plan:   Principal Problem:   MRSA bacteremia Active Problems:   Diabetes ([place])   Essential hypertension   ESRD on dialysis ([place])   Shock ([place])   Status post CVA   Schatzki's ring   MRSA bacteremia: secondary infected HD catheter which has been removed w/ MRSA bacteremia. S/p placement of temporary HD catheter. Continue on IV daptomycin. Blood cx 05/14/19 are still growing MRSA. Repeat blood cx 05/16/19 NGTD.  Septic shock: secondary to above. Continue on IV abxs   ESRD: on HD. Temp catheter removed secondary to recurrent MRSA bacteremia. HD as per nephro  Thrombocytopenia: etiology unclear, possibly secondary to infection stated above. 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 No need for a transfusion at this time. Will continue to monitor   Severe protein calorie malnutrition: w/ poor po intake due to chronic issues w/ dysphagia. Nutrition consulted   Weakness: w/ left hemiparesis. PT recs SNF. OT consulted   DM2: will continue on SSI w/ accuchecks  HLD: will continue on statin   HTN: will continue carvedilol, amlodipine   GERD: continue on PPI   CAD: will continue on carvedilol, amlodipine & statin.    DVT prophylaxis: SCDs Code Status:  Full  Family Communication: discussed pt's care w/ pt's daughter, [name], and answered her questions  Disposition Plan: PT recs SNF, will likely d/c to SNF    Consultants:   ID  Vascular surgery  ICU  nephro   Procedures:    Antimicrobials: daptomycin    Subjective: Pt c/o malaise   Objective: Vitals:   05/17/19 0400 05/17/19 0500 05/17/19 0600 05/17/19 0700  BP: (!) 123/54 (!) 119/49 (!) 127/54 (!) 140/52  Pulse: 63 63 63 67  Resp: 20 20 20 20   Temp: 98.4 F (36.9 C)     TempSrc: Oral     SpO2: 93% 95% 94% (!) 88%  Weight:  80.1 kg    Height:         Intake/Output Summary (Last 24 hours) at 05/17/2019 0820 Last data filed at 05/16/2019 0916 Gross per 24 hour  Intake 100 ml  Output --  Net 100 ml   Filed Weights   05/11/2019 0500 05/16/19 0500 05/17/19 0500  Weight: 66.3 kg 68.2 kg 80.1 kg    Examination:  General exam: Appears calm and comfortable. Frail appearing.  Respiratory system: decreased breath sounds b/l . Cardiovascular system: S1 & S2 +. No rubs, gallops or clicks.  Gastrointestinal system: Abdomen is nondistended, soft and nontender. Hypoactive bowel sounds heard. Central nervous system: Lethargic. Moves all 4 extremities  Psychiatry: Judgement and insight appear abnormal. Flat mood and affect    Data Reviewed: I have personally reviewed following labs and imaging studies  CBC: Recent Labs  Lab 06/05/2019 1324 05/11/19 0611 05/13/19 0556 05/14/19 1051 06/06/2019 0757 05/16/19 0327 05/17/19 0423  WBC 9.4   < > 5.1 6.5 7.7 9.5 9.2  NEUTROABS 7.5  --   --   --   --   --   --   HGB 6.9*   < > 7.8* 8.3* 8.9* 8.3* 7.8*  HCT 21.4*   < > 23.6* 22.2* 25.5* 23.7* 23.1*  MCV 103.4*   < > 96.3 86.4 93.4 90.5 94.3  PLT 82*   < > 38* 59* 51* 70* 48*   < > = values  in this interval not displayed.   Basic Metabolic Panel: Recent Labs  Lab 06/04/2019 1324 05/17/2019 1625 05/31/2019 2135 05/31/2019 2135 05/11/19 0175 05/11/19 1627 05/12/19 0502 05/12/19 0502 05/13/19 0556 05/14/19 1051 05/13/2019 0633 05/16/19 0327 05/17/19 0423  NA   < >  --  136   < > 134*  --  135   < > 138 143 141 142 142  K   < >  --  3.5   < > 3.6   < > 4.2   < > 4.1 4.3 3.7 3.5 3.5  CL   < >  --  103   < > 100  --  103   < > 105 112* 110 108 110  CO2   < >  --  15*   < > 15*  --  21*   < > 16* 16* 19* 26 24  GLUCOSE   < >  --  202*   < > 261*  --  157*   < > 140* 123* 117* 269* 96  BUN   < >  --  25*   < > 28*  --  30*   < > 38* 44* 48* 24* 34*  CREATININE   < >  --  2.04*   < > 2.13*  --  2.47*   < > 3.01* 3.54* 3.76* 2.16* 2.66*  CALCIUM    < >  --  7.9*   < > 7.8*  --  8.5*   < > 9.1 9.4 9.3 8.8* 8.7*  MG  --  1.6*  --   --  2.3  --  2.0  --   --   --   --   --   --   PHOS  --   --  1.6*  --  4.6  --  3.6  --   --  3.1  --  1.3*  --    < > = values in this interval not displayed.   GFR: Estimated Creatinine Clearance: 17.6 mL/min (A) (by C-G formula based on SCr of 2.66 mg/dL (H)). Liver Function Tests: Recent Labs  Lab 06/04/2019 1324 05/14/19 1051 05/16/19 0327 05/17/19 0423  AST 26  --   --  22  ALT 14  --   --  19  ALKPHOS 55  --   --  78  BILITOT 1.5*  --   --  1.7*  PROT 3.9*  --   --  4.9*  ALBUMIN 1.3* 2.7* 2.3* 2.1*   Recent Labs  Lab 05/12/2019 1324  LIPASE <10*   Recent Labs  Lab 06/02/2019 1234  AMMONIA 13   Coagulation Profile: No results for input(s): INR, PROTIME in the last 168 hours. Cardiac Enzymes: Recent Labs  Lab 05/23/2019 1324 06/02/2019 0633  CKTOTAL 17* 10*   BNP (last 3 results) No results for input(s): PROBNP in the last 8760 hours. HbA1C: No results for input(s): HGBA1C in the last 72 hours. CBG: Recent Labs  Lab 05/16/19 1659 05/16/19 1925 05/16/19 2353 05/17/19 0401 05/17/19 0742  GLUCAP 115* 159* 90 85 62*   Lipid Profile: No results for input(s): CHOL, HDL, LDLCALC, TRIG, CHOLHDL, LDLDIRECT in the last 72 hours. Thyroid Function Tests: No results for input(s): TSH, T4TOTAL, FREET4, T3FREE, THYROIDAB in the last 72 hours. Anemia Panel: No results for input(s): VITAMINB12, FOLATE, FERRITIN, TIBC, IRON, RETICCTPCT in the last 72 hours. Sepsis Labs: Recent Labs  Lab 05/23/2019 1324 05/17/2019 2306 05/11/19 1025 05/12/19 0502  PROCALCITON 62.53  --  55.12 45.92  LATICACIDVEN 1.9 1.5 1.4  --     Recent Results (from the past 240 hour(s))  Blood culture (routine x 2)     Status: Abnormal   Collection Time: 05/23/2019 12:34 PM   Specimen: BLOOD  Result Value Ref Range Status   Specimen Description   Final    BLOOD R UP ARM Performed at [facility], [address], [place], [place] [address]    Special Requests   Final    BOTTLES DRAWN AEROBIC AND ANAEROBIC Blood Culture adequate volume Performed at [facility], [address] [place], [place] [address]    Culture  Setup Time   Final    Organism ID to follow IN BOTH AEROBIC AND ANAEROBIC BOTTLES GRAM POSITIVE COCCI CRITICAL RESULT CALLED TO, READ BACK BY AND VERIFIED WITH: SCOTT HALL ON 05/11/19 AT 0210 [facility] Performed at [facility], [address] [place], [place] [address]    Culture (A)  Final    STAPHYLOCOCCUS AUREUS SUSCEPTIBILITIES PERFORMED ON PREVIOUS CULTURE WITHIN THE LAST 5 DAYS. Performed at [facility], [place] [address], [place], [place] [address]    Report Status 05/13/2019 FINAL  Final  Blood Culture ID Panel (Reflexed)     Status: Abnormal   Collection Time: 05/30/2019 12:34 PM  Result Value Ref Range Status   Enterococcus species NOT DETECTED NOT DETECTED Final   Listeria monocytogenes NOT DETECTED NOT DETECTED Final   Staphylococcus species DETECTED (A) NOT DETECTED Final    Comment: CRITICAL RESULT CALLED TO, READ BACK BY AND VERIFIED WITH: SCOTT HALL ON 05/11/19 AT 0210 [facility]    Staphylococcus aureus (BCID) DETECTED (A) NOT DETECTED Final    Comment: Methicillin (oxacillin)-resistant Staphylococcus aureus (MRSA). MRSA is predictably resistant to beta-lactam antibiotics (except ceftaroline). Preferred therapy is vancomycin unless clinically contraindicated. Patient requires contact precautions if  hospitalized. CRITICAL RESULT CALLED TO, READ BACK BY AND VERIFIED WITH: SCOTT HALL ON 05/11/19 AT 0210 [facility]    Methicillin resistance DETECTED (A) NOT DETECTED Final    Comment: CRITICAL RESULT CALLED TO, READ BACK BY AND VERIFIED WITH: SCOTT HALL ON 05/11/19 AT 0210 [place]    Streptococcus species NOT DETECTED NOT DETECTED Final   Streptococcus agalactiae NOT DETECTED NOT DETECTED Final   Streptococcus pneumoniae NOT DETECTED NOT DETECTED Final    Streptococcus pyogenes NOT DETECTED NOT DETECTED Final   Acinetobacter baumannii NOT DETECTED NOT DETECTED Final   Enterobacteriaceae species NOT DETECTED NOT DETECTED Final   Enterobacter cloacae complex NOT DETECTED NOT DETECTED Final   Escherichia coli NOT DETECTED NOT DETECTED Final   Klebsiella oxytoca NOT DETECTED NOT DETECTED Final   Klebsiella pneumoniae NOT DETECTED NOT DETECTED Final   Proteus species NOT DETECTED NOT DETECTED Final   Serratia marcescens NOT DETECTED NOT DETECTED Final   Haemophilus influenzae NOT DETECTED NOT DETECTED Final   Neisseria meningitidis NOT DETECTED NOT DETECTED Final   Pseudomonas aeruginosa NOT DETECTED NOT DETECTED Final   Candida albicans NOT DETECTED NOT DETECTED Final   Candida glabrata NOT DETECTED NOT DETECTED Final   Candida krusei NOT DETECTED NOT DETECTED Final   Candida parapsilosis NOT DETECTED NOT DETECTED Final   Candida tropicalis NOT DETECTED NOT DETECTED Final    Comment: Performed at [facility], [address] [place], [place] [address]  Blood culture (routine x 2)     Status: Abnormal   Collection Time: 05/17/2019  1:24 PM   Specimen: BLOOD  Result Value Ref Range Status   Specimen Description   Final    BLOOD R LATREAL BICEP Performed at [facility], [address], [place], [place] [address]    Special Requests   Final    BOTTLES DRAWN AEROBIC AND ANAEROBIC Blood Culture adequate volume Performed at [facility], [address] [place], [place] [address]    Culture  Setup Time   Final    IN BOTH AEROBIC AND ANAEROBIC BOTTLES GRAM POSITIVE COCCI CRITICAL VALUE NOTED.  VALUE IS CONSISTENT WITH PREVIOUSLY REPORTED AND CALLED VALUE. Performed at [facility], [address] [place], [place] [address]    Culture METHICILLIN RESISTANT STAPHYLOCOCCUS AUREUS (A)  Final   Report Status 05/13/2019 FINAL  Final   Organism ID, Bacteria METHICILLIN RESISTANT STAPHYLOCOCCUS AUREUS  Final       Susceptibility   Methicillin resistant staphylococcus aureus - MIC*    CIPROFLOXACIN >=8 RESISTANT Resistant     ERYTHROMYCIN >=8 RESISTANT Resistant     GENTAMICIN <=0.5 SENSITIVE Sensitive     OXACILLIN >=4 RESISTANT Resistant     TETRACYCLINE <=1 SENSITIVE Sensitive     VANCOMYCIN <=0.5 SENSITIVE Sensitive     TRIMETH/SULFA >=320 RESISTANT Resistant     CLINDAMYCIN <=0.25 SENSITIVE Sensitive     RIFAMPIN <=0.5 SENSITIVE Sensitive     Inducible Clindamycin NEGATIVE Sensitive     * METHICILLIN RESISTANT STAPHYLOCOCCUS AUREUS  Respiratory Panel by RT PCR (Flu A&B, Covid) - Nasopharyngeal Swab     Status: None   Collection Time: 05/12/2019  1:24 PM   Specimen: Nasopharyngeal Swab  Result Value Ref Range Status   SARS Coronavirus 2 by RT PCR NEGATIVE NEGATIVE Final    Comment: (NOTE) SARS-CoV-2 target nucleic acids are NOT DETECTED. The SARS-CoV-2 RNA is generally detectable in upper respiratoy specimens during the acute phase of infection. The lowest concentration of SARS-CoV-2 viral copies this assay can detect is 131 copies/mL. A negative result does not preclude SARS-Cov-2 infection and should not be used as the sole basis for treatment or other patient management decisions. A negative result may occur with  improper specimen collection/handling, submission of specimen other than nasopharyngeal swab, presence of viral mutation(s) within the areas targeted by this assay, and inadequate number of viral copies (<131 copies/mL). A negative result must be combined with clinical observations, patient history, and epidemiological information. The expected result is Negative. Fact Sheet for Patients:  PinkCheek.be Fact Sheet for Healthcare Providers:  GravelBags.it This test is not yet ap proved or cleared by the [place] FDA and  has been authorized for detection and/or diagnosis of SARS-CoV-2 by FDA under an Emergency  Use Authorization (EUA). This EUA will remain  in effect (meaning this test can be used) for the duration of the COVID-19 declaration under Section 564(b)(1) of the Act, 21 U.S.C. section 360bbb-3(b)(1), unless the authorization is terminated or revoked sooner.    Influenza A by PCR NEGATIVE NEGATIVE Final   Influenza B by PCR NEGATIVE NEGATIVE Final    Comment: (NOTE) The Xpert Xpress SARS-CoV-2/FLU/RSV assay is intended as an aid in  the diagnosis of influenza from Nasopharyngeal swab specimens and  should not be used as a sole basis for treatment. Nasal washings and  aspirates are unacceptable for Xpert Xpress SARS-CoV-2/FLU/RSV  testing. Fact Sheet for Patients: PinkCheek.be Fact Sheet for Healthcare Providers: GravelBags.it This test is not yet approved or cleared by the [place] FDA and  has been authorized for detection and/or diagnosis of  SARS-CoV-2 by  FDA under an Emergency Use Authorization (EUA). This EUA will remain  in effect (meaning this test can be used) for the duration of the  Covid-19 declaration under Section 564(b)(1) of the Act, 21  U.S.C. section 360bbb-3(b)(1), unless the authorization is  terminated or revoked. Performed at [facility], [address] [place], [place] [address]   MRSA PCR Screening     Status: None   Collection Time: 06/06/2019  6:27 PM   Specimen: Nasopharyngeal  Result Value Ref Range Status   MRSA by PCR NEGATIVE NEGATIVE Final    Comment:        The GeneXpert MRSA Assay (FDA approved for NASAL specimens only), is one component of a comprehensive MRSA colonization surveillance program. It is not intended to diagnose MRSA infection nor to guide or monitor treatment for MRSA infections. Performed at [facility], [address] [place], [place] [address]   Urine culture     Status: Abnormal   Collection Time: 05/08/2019 10:30 PM   Specimen: Urine,  Clean Catch  Result Value Ref Range Status   Specimen Description   Final    URINE, CLEAN CATCH Performed at [facility], [address], [place], [place] [address]    Special Requests   Final    NONE Performed at [facility], [place], [place] [address]    Culture (A)  Final    >=100,000 COLONIES/mL KLEBSIELLA PNEUMONIAE >=100,000 COLONIES/mL VANCOMYCIN RESISTANT ENTEROCOCCUS    Report Status 05/14/2019 FINAL  Final   Organism ID, Bacteria KLEBSIELLA PNEUMONIAE (A)  Final   Organism ID, Bacteria VANCOMYCIN RESISTANT ENTEROCOCCUS (A)  Final      Susceptibility   Klebsiella pneumoniae - MIC*    AMPICILLIN >=32 RESISTANT Resistant     CEFAZOLIN <=4 SENSITIVE Sensitive     CEFTRIAXONE <=0.25 SENSITIVE Sensitive     CIPROFLOXACIN <=0.25 SENSITIVE Sensitive     GENTAMICIN <=1 SENSITIVE Sensitive     IMIPENEM 0.5 SENSITIVE Sensitive     NITROFURANTOIN 64 INTERMEDIATE Intermediate     TRIMETH/SULFA <=20 SENSITIVE Sensitive     AMPICILLIN/SULBACTAM 4 SENSITIVE Sensitive     PIP/TAZO <=4 SENSITIVE Sensitive     * >=100,000 COLONIES/mL KLEBSIELLA PNEUMONIAE   Vancomycin resistant enterococcus - MIC*    AMPICILLIN >=32 RESISTANT Resistant     NITROFURANTOIN 256 RESISTANT Resistant     VANCOMYCIN >=32 RESISTANT Resistant     LINEZOLID 2 SENSITIVE Sensitive     * >=100,000 COLONIES/mL VANCOMYCIN RESISTANT ENTEROCOCCUS  Cath Tip Culture     Status: Abnormal   Collection Time: 05/11/19  1:10 PM   Specimen: Catheter Tip; Other  Result Value Ref Range Status   Specimen Description   Final    CATH TIP Performed at [facility], [address] [place], [place] [address]    Special Requests   Final    NONE Performed at [facility], [address] [place], [place] [address]    Culture (A)  Final    >=100,000 COLONIES/mL METHICILLIN RESISTANT STAPHYLOCOCCUS AUREUS   Report Status 05/13/2019 FINAL  Final   Organism ID, Bacteria  METHICILLIN RESISTANT STAPHYLOCOCCUS AUREUS (A)  Final      Susceptibility   Methicillin resistant staphylococcus aureus - MIC*    CIPROFLOXACIN >=8 RESISTANT Resistant     ERYTHROMYCIN >=8 RESISTANT Resistant     GENTAMICIN <=0.5 SENSITIVE Sensitive     OXACILLIN >=4 RESISTANT Resistant     TETRACYCLINE <=  1 SENSITIVE Sensitive     VANCOMYCIN 1 SENSITIVE Sensitive     TRIMETH/SULFA >=320 RESISTANT Resistant     CLINDAMYCIN <=0.25 SENSITIVE Sensitive     RIFAMPIN <=0.5 SENSITIVE Sensitive     Inducible Clindamycin NEGATIVE Sensitive     * >=100,000 COLONIES/mL METHICILLIN RESISTANT STAPHYLOCOCCUS AUREUS  Culture, blood (routine x 2)     Status: Abnormal   Collection Time: 05/11/19  4:27 PM   Specimen: BLOOD  Result Value Ref Range Status   Specimen Description   Final    BLOOD A-LINE Performed at [facility], [address] [place], [place] [address]    Special Requests   Final    BOTTLES DRAWN AEROBIC AND ANAEROBIC Blood Culture adequate volume Performed at [facility], [address] [place], [place] [address]    Culture  Setup Time   Final    GRAM POSITIVE COCCI ANAEROBIC BOTTLE ONLY CRITICAL VALUE NOTED.  VALUE IS CONSISTENT WITH PREVIOUSLY REPORTED AND CALLED VALUE. Performed at [facility], [address] [place], [place] [address]    Culture (A)  Final    STAPHYLOCOCCUS AUREUS SUSCEPTIBILITIES PERFORMED ON PREVIOUS CULTURE WITHIN THE LAST 5 DAYS. Performed at [facility], [place] [address], [place], [place] [address]    Report Status 05/23/2019 FINAL  Final  Culture, blood (routine x 2)     Status: Abnormal   Collection Time: 05/12/19  6:06 AM   Specimen: BLOOD  Result Value Ref Range Status   Specimen Description   Final    BLOOD BLOOD RIGHT HAND Performed at [facility], [address], [place], [place] [address]    Special Requests   Final    BOTTLES DRAWN AEROBIC AND ANAEROBIC Blood Culture adequate  volume Performed at [facility], [address] [place], [place] [address]    Culture  Setup Time   Final    GRAM POSITIVE COCCI ANAEROBIC BOTTLE ONLY CRITICAL RESULT CALLED TO, READ BACK BY AND VERIFIED WITH: [place] ON 05/12/2019 BY MOSLEY,J Performed at [facility], [address] [place], [place] [address]    Culture METHICILLIN RESISTANT STAPHYLOCOCCUS AUREUS (A)  Final   Report Status 06/02/2019 FINAL  Final   Organism ID, Bacteria METHICILLIN RESISTANT STAPHYLOCOCCUS AUREUS  Final      Susceptibility   Methicillin resistant staphylococcus aureus - MIC*    CIPROFLOXACIN >=8 RESISTANT Resistant     ERYTHROMYCIN >=8 RESISTANT Resistant     GENTAMICIN <=0.5 SENSITIVE Sensitive     OXACILLIN >=4 RESISTANT Resistant     TETRACYCLINE <=1 SENSITIVE Sensitive     VANCOMYCIN 1 SENSITIVE Sensitive     TRIMETH/SULFA >=320 RESISTANT Resistant     CLINDAMYCIN <=0.25 SENSITIVE Sensitive     RIFAMPIN <=0.5 SENSITIVE Sensitive     Inducible Clindamycin NEGATIVE Sensitive     * METHICILLIN RESISTANT STAPHYLOCOCCUS AUREUS  CULTURE, BLOOD (ROUTINE X 2) w Reflex to ID Panel     Status: Abnormal   Collection Time: 05/13/19  2:20 PM   Specimen: BLOOD  Result Value Ref Range Status   Specimen Description   Final    BLOOD BLOOD RIGHT HAND Performed at [facility], [address], [place], [place] [address]    Special Requests   Final    BOTTLES DRAWN AEROBIC AND ANAEROBIC Blood Culture adequate volume Performed at [facility], [address] [place], [place] [address]    Culture  Setup Time  Final    GRAM POSITIVE COCCI ANAEROBIC BOTTLE ONLY CRITICAL RESULT CALLED TO, READ BACK BY AND VERIFIED WITHCurt Jews AT 2440 05/14/19 [place] Performed at [facility], [address] [place], [place] [address]    Culture (A)  Final    STAPHYLOCOCCUS AUREUS SUSCEPTIBILITIES PERFORMED ON PREVIOUS CULTURE WITHIN THE LAST 5  DAYS. Performed at [facility], [place] [address], [place], [place] [address]    Report Status 05/16/2019 FINAL  Final  CULTURE, BLOOD (ROUTINE X 2) w Reflex to ID Panel     Status: None (Preliminary result)   Collection Time: 05/13/19  3:39 PM   Specimen: BLOOD  Result Value Ref Range Status   Specimen Description BLOOD BLOOD LEFT HAND  Final   Special Requests   Final    BOTTLES DRAWN AEROBIC ONLY Blood Culture adequate volume   Culture   Final    NO GROWTH 2 DAYS Performed at [facility], [address], [place], [place] [address]    Report Status PENDING  Incomplete  CULTURE, BLOOD (ROUTINE X 2) w Reflex to ID Panel     Status: None (Preliminary result)   Collection Time: 05/14/19  3:33 PM   Specimen: BLOOD  Result Value Ref Range Status   Specimen Description   Final    BLOOD BLOOD RIGHT HAND Performed at [facility], [address], [place], [place] [address]    Special Requests   Final    BOTTLES DRAWN AEROBIC AND ANAEROBIC Blood Culture adequate volume Performed at [facility], [address] [place], [place] [address]    Culture  Setup Time   Final    GRAM POSITIVE COCCI IN CLUSTERS ANAEROBIC BOTTLE ONLY CRITICAL VALUE NOTED.  VALUE IS CONSISTENT WITH PREVIOUSLY REPORTED AND CALLED VALUE. Performed at [facility], [place] [address], [place], [place] [address]    Culture GRAM POSITIVE COCCI  Final   Report Status PENDING  Incomplete  Cath Tip Culture     Status: None (Preliminary result)   Collection Time: 05/14/19  4:24 PM   Specimen: Catheter Tip; Other  Result Value Ref Range Status   Specimen Description   Final    CATH TIP Performed at [facility], [address], [place], [place] [address]    Special Requests   Final    NONE Performed at [facility], [address], [place], [place] [address]    Culture   Final    NO GROWTH 2 DAYS Performed at [facility], [place] [address],  [place], [place] [address]    Report Status PENDING  Incomplete  CULTURE, BLOOD (ROUTINE X 2) w Reflex to ID Panel     Status: Abnormal (Preliminary result)   Collection Time: 05/14/19  6:25 PM   Specimen: BLOOD  Result Value Ref Range Status   Specimen Description   Final    BLOOD BLOOD LEFT HAND Performed at [facility], [address], [place], [place] [address]    Special Requests   Final    BOTTLES DRAWN AEROBIC AND ANAEROBIC Blood Culture results may not be optimal due to an inadequate volume of blood received in culture bottles Performed at [facility], [address] [place], [place] [address]    Culture  Setup Time   Final    GRAM POSITIVE COCCI ANAEROBIC BOTTLE ONLY CRITICAL VALUE NOTED.  VALUE IS CONSISTENT WITH PREVIOUSLY REPORTED AND CALLED VALUE. [place] Performed at [facility], [address], [place], [place] [address]    Culture (A)  Final    STAPHYLOCOCCUS AUREUS SUSCEPTIBILITIES PERFORMED ON PREVIOUS CULTURE WITHIN THE LAST 5 DAYS. Performed at [facility], [place] [address] [place],  [address]    Report Status PENDING  Incomplete         Radiology Studies: PERIPHERAL VASCULAR CATHETERIZATION  Result Date: 05/13/2019 See op note       Scheduled Meds: . amLODipine  10 mg Oral Daily  . atorvastatin  10 mg Oral q1800  . carvedilol  25 mg Oral BID WC  . Chlorhexidine Gluconate Cloth  6 each Topical Daily  . insulin aspart  0-9 Units Subcutaneous Q4H  . midodrine  5 mg Oral Q M,W,F  . pantoprazole (PROTONIX) IV  40 mg Intravenous QHS  . vancomycin variable dose per unstable renal function (pharmacist dosing)   Does not apply See admin instructions   Continuous Infusions: . DAPTOmycin (CUBICIN)  IV 500 mg (05/16/19 2032)  . levETIRAcetam 500 mg (05/16/19 1736)     LOS: 7 days    Time spent: 30 mins     Wyvonnia Dusky, MD [facility] Pager [phone]  If 7PM-7AM, please contact  night-coverage [email or web address] 05/17/2019, 8:20 AM

## 2019-05-17 NOTE — Progress Notes (Signed)
OT Cancellation Note  Patient Details Name: Ravon Mcilhenny MRN: [id number] DOB: 1946/02/02   Cancelled Treatment:    Reason Eval/Treat Not Completed: Patient at procedure or test/ unavailable  RN/CNA in pt room addressing pt care at this time, will f/u as able for OT tx. Thank you.  Gerrianne Scale, [place], OTR/L ascom [phone] 05/17/19, 3:45 PM

## 2019-05-17 NOTE — Progress Notes (Signed)
[facility]  ROUNDING NOTE   Subjective:   Doing fair Appetite appears to be poor Did not eat this morning Pureed diet  Objective:  Vital signs in last 24 hours:  Temp:  [98.1 F (36.7 C)-98.9 F (37.2 C)] 98.9 F (37.2 C) (04/10 0800) Pulse Rate:  [58-67] 64 (04/10 0900) Resp:  [16-26] 26 (04/10 0900) BP: (93-146)/(40-99) 134/46 (04/10 0900) SpO2:  [88 %-100 %] 92 % (04/10 0900) Weight:  [80.1 kg] 80.1 kg (04/10 0500)  Weight change: 11.9 kg Filed Weights   06/05/2019 0500 05/16/19 0500 05/17/19 0500  Weight: 66.3 kg 68.2 kg 80.1 kg    Intake/Output: I/O last 3 completed shifts: In: 160 [P.O.:60; IV Piggyback:100] Out: -    Intake/Output this shift:  Total I/O In: 270 [P.O.:60; IV Piggyback:210] Out: -   Physical Exam: General: Ill appearing  Head: Normocephalic, atraumatic.   Eyes: Anicteric,   Neck: Supple,   Lungs:  Clear, room air  Heart: Regular rate and rhythm  Abdomen:  Soft, nontender   Extremities:  no peripheral edema.  Neurologic: Left sided weakness. Moving her extremities. Alert to self    Skin: No lesions  Access: none    Basic Metabolic Panel: Recent Labs  Lab 05/17/2019 1324 06/02/2019 1625 05/31/2019 2135 05/13/2019 2135 05/11/19 0611 05/11/19 1627 05/12/19 0502 05/12/19 0502 05/13/19 0556 05/13/19 0556 05/14/19 1051 05/14/19 1051 06/02/2019 0633 05/16/19 0327 05/17/19 0423  NA   < >  --  136   < > 134*  --  135   < > 138  --  143  --  141 142 142  K   < >  --  3.5   < > 3.6   < > 4.2   < > 4.1  --  4.3  --  3.7 3.5 3.5  CL   < >  --  103   < > 100  --  103   < > 105  --  112*  --  110 108 110  CO2   < >  --  15*   < > 15*  --  21*   < > 16*  --  16*  --  19* 26 24  GLUCOSE   < >  --  202*   < > 261*  --  157*   < > 140*  --  123*  --  117* 269* 96  BUN   < >  --  25*   < > 28*  --  30*   < > 38*  --  44*  --  48* 24* 34*  CREATININE   < >  --  2.04*   < > 2.13*  --  2.47*   < > 3.01*  --  3.54*  --  3.76* 2.16* 2.66*   CALCIUM   < >  --  7.9*   < > 7.8*  --  8.5*   < > 9.1   < > 9.4   < > 9.3 8.8* 8.7*  MG  --  1.6*  --   --  2.3  --  2.0  --   --   --   --   --   --   --   --   PHOS  --   --  1.6*  --  4.6  --  3.6  --   --   --  3.1  --   --  1.3*  --    < > =  values in this interval not displayed.    Liver Function Tests: Recent Labs  Lab 05/31/2019 1324 05/14/19 1051 05/16/19 0327 05/17/19 0423  AST 26  --   --  22  ALT 14  --   --  19  ALKPHOS 55  --   --  78  BILITOT 1.5*  --   --  1.7*  PROT 3.9*  --   --  4.9*  ALBUMIN 1.3* 2.7* 2.3* 2.1*   Recent Labs  Lab 05/31/2019 1324  LIPASE <10*   Recent Labs  Lab 05/18/2019 1234  AMMONIA 13    CBC: Recent Labs  Lab 06/02/2019 1324 05/11/19 0611 05/13/19 0556 05/14/19 1051 05/31/2019 0757 05/16/19 0327 05/17/19 0423  WBC 9.4   < > 5.1 6.5 7.7 9.5 9.2  NEUTROABS 7.5  --   --   --   --   --   --   HGB 6.9*   < > 7.8* 8.3* 8.9* 8.3* 7.8*  HCT 21.4*   < > 23.6* 22.2* 25.5* 23.7* 23.1*  MCV 103.4*   < > 96.3 86.4 93.4 90.5 94.3  PLT 82*   < > 38* 59* 51* 70* 48*   < > = values in this interval not displayed.    Cardiac Enzymes: Recent Labs  Lab 05/13/2019 1324 05/14/2019 0633  CKTOTAL 17* 10*    BNP: Invalid input(s): POCBNP  CBG: Recent Labs  Lab 05/16/19 1925 05/16/19 2353 05/17/19 0401 05/17/19 0742 05/17/19 0828  GLUCAP 159* 90 85 62* 195*    Microbiology: Results for orders placed or performed during the hospital encounter of 06/02/2019  Blood culture (routine x 2)     Status: Abnormal   Collection Time: 05/17/2019 12:34 PM   Specimen: BLOOD  Result Value Ref Range Status   Specimen Description   Final    BLOOD R UP ARM Performed at [facility], [address] [place], [place] [address]    Special Requests   Final    BOTTLES DRAWN AEROBIC AND ANAEROBIC Blood Culture adequate volume Performed at [facility], [address] [place], [place] [address]    Culture  Setup Time   Final     Organism ID to follow IN [place] TO, READ BACK BY AND VERIFIED WITH: [place] ON 05/11/19 AT 0210 [facility] Performed at [facility], [address] [place], [place] [address]    Culture (A)  Final    STAPHYLOCOCCUS AUREUS SUSCEPTIBILITIES PERFORMED ON PREVIOUS CULTURE WITHIN THE LAST 5 DAYS. Performed at [facility], [place] [address], [place], [place] [address]    Report Status 05/13/2019 FINAL  Final  Blood Culture ID Panel (Reflexed)     Status: Abnormal   Collection Time: 06/02/2019 12:34 PM  Result Value Ref Range Status   Enterococcus species NOT DETECTED NOT DETECTED Final   Listeria monocytogenes NOT DETECTED NOT DETECTED Final   Staphylococcus species DETECTED (A) NOT DETECTED Final    Comment: CRITICAL RESULT CALLED TO, READ BACK BY AND VERIFIED WITH: [place] ON 05/11/19 AT 0210 [facility]    Staphylococcus aureus (BCID) DETECTED (A) NOT DETECTED Final    Comment: Methicillin (oxacillin)-resistant Staphylococcus aureus (MRSA). MRSA is predictably resistant to beta-lactam antibiotics (except ceftaroline). Preferred therapy is vancomycin unless clinically contraindicated. Patient requires contact precautions if  hospitalized. CRITICAL RESULT CALLED TO, READ BACK BY AND VERIFIED WITH: [place] H[place] ON 05/11/19 AT 0210 [facility]  Methicillin resistance DETECTED (A) NOT DETECTED Final    Comment: CRITICAL RESULT CALLED TO, READ BACK BY AND VERIFIED WITH: SCOTT HALL ON 05/11/19 AT 0210 [place]    Streptococcus species NOT DETECTED NOT DETECTED Final   Streptococcus agalactiae NOT DETECTED NOT DETECTED Final   Streptococcus pneumoniae NOT DETECTED NOT DETECTED Final   Streptococcus pyogenes NOT DETECTED NOT DETECTED Final   Acinetobacter baumannii NOT DETECTED NOT DETECTED Final   Enterobacteriaceae species NOT DETECTED NOT DETECTED Final   Enterobacter cloacae complex NOT DETECTED NOT DETECTED Final   Escherichia coli  NOT DETECTED NOT DETECTED Final   Klebsiella oxytoca NOT DETECTED NOT DETECTED Final   Klebsiella pneumoniae NOT DETECTED NOT DETECTED Final   Proteus species NOT DETECTED NOT DETECTED Final   Serratia marcescens NOT DETECTED NOT DETECTED Final   Haemophilus influenzae NOT DETECTED NOT DETECTED Final   Neisseria meningitidis NOT DETECTED NOT DETECTED Final   Pseudomonas aeruginosa NOT DETECTED NOT DETECTED Final   Candida albicans NOT DETECTED NOT DETECTED Final   Candida glabrata NOT DETECTED NOT DETECTED Final   Candida krusei NOT DETECTED NOT DETECTED Final   Candida parapsilosis NOT DETECTED NOT DETECTED Final   Candida tropicalis NOT DETECTED NOT DETECTED Final    Comment: Performed at [facility], [address] [place], [place] [address]  Blood culture (routine x 2)     Status: Abnormal   Collection Time: 05/09/2019  1:24 PM   Specimen: BLOOD  Result Value Ref Range Status   Specimen Description   Final    BLOOD R LATREAL BICEP Performed at [facility], [address] [place], [place] [address]    Special Requests   Final    BOTTLES DRAWN AEROBIC AND ANAEROBIC Blood Culture adequate volume Performed at [facility], [address] [place], [place] [address]    Culture  Setup Time   Final    IN BOTH AEROBIC AND ANAEROBIC BOTTLES GRAM POSITIVE COCCI CRITICAL VALUE NOTED.  VALUE IS CONSISTENT WITH PREVIOUSLY REPORTED AND CALLED VALUE. Performed at [facility], [address] [place],  [address]    Culture METHICILLIN RESISTANT STAPHYLOCOCCUS AUREUS (A)  Final   Report Status 05/13/2019 FINAL  Final   Organism ID, Bacteria METHICILLIN RESISTANT STAPHYLOCOCCUS AUREUS  Final      Susceptibility   Methicillin resistant staphylococcus aureus - MIC*    CIPROFLOXACIN >=8 RESISTANT Resistant     ERYTHROMYCIN >=8 RESISTANT Resistant     GENTAMICIN <=0.5 SENSITIVE Sensitive     OXACILLIN >=4 RESISTANT Resistant     TETRACYCLINE  <=1 SENSITIVE Sensitive     VANCOMYCIN <=0.5 SENSITIVE Sensitive     TRIMETH/SULFA >=320 RESISTANT Resistant     CLINDAMYCIN <=0.25 SENSITIVE Sensitive     RIFAMPIN <=0.5 SENSITIVE Sensitive     Inducible Clindamycin NEGATIVE Sensitive     * METHICILLIN RESISTANT STAPHYLOCOCCUS AUREUS  Respiratory Panel by RT PCR (Flu A&B, Covid) - Nasopharyngeal Swab     Status: None   Collection Time: 06/06/2019  1:24 PM   Specimen: Nasopharyngeal Swab  Result Value Ref Range Status   SARS Coronavirus 2 by RT PCR NEGATIVE NEGATIVE Final    Comment: (NOTE) SARS-CoV-2 target nucleic acids are NOT DETECTED. The SARS-CoV-2 RNA is generally detectable in upper respiratoy specimens during the acute phase of infection. The lowest concentration of SARS-CoV-2 viral copies this assay can detect is 131 copies/mL. A negative result does not preclude SARS-Cov-2 infection and should not be used as the  sole basis for treatment or other patient management decisions. A negative result may occur with  improper specimen collection/handling, submission of specimen other than nasopharyngeal swab, presence of viral mutation(s) within the areas targeted by this assay, and inadequate number of viral copies (<131 copies/mL). A negative result must be combined with clinical observations, patient history, and epidemiological information. The expected result is Negative. Fact Sheet for Patients:  PinkCheek.be Fact Sheet for Healthcare Providers:  GravelBags.it This test is not yet ap proved or cleared by the [place] FDA and  has been authorized for detection and/or diagnosis of SARS-CoV-2 by FDA under an Emergency Use Authorization (EUA). This EUA will remain  in effect (meaning this test can be used) for the duration of the COVID-19 declaration under Section 564(b)(1) of the Act, 21 U.S.C. section 360bbb-3(b)(1), unless the authorization is terminated or revoked  sooner.    Influenza A by PCR NEGATIVE NEGATIVE Final   Influenza B by PCR NEGATIVE NEGATIVE Final    Comment: (NOTE) The Xpert Xpress SARS-CoV-2/FLU/RSV assay is intended as an aid in  the diagnosis of influenza from Nasopharyngeal swab specimens and  should not be used as a sole basis for treatment. Nasal washings and  aspirates are unacceptable for Xpert Xpress SARS-CoV-2/FLU/RSV  testing. Fact Sheet for Patients: PinkCheek.be Fact Sheet for Healthcare Providers: GravelBags.it This test is not yet approved or cleared by the [place] FDA and  has been authorized for detection and/or diagnosis of SARS-CoV-2 by  FDA under an Emergency Use Authorization (EUA). This EUA will remain  in effect (meaning this test can be used) for the duration of the  Covid-19 declaration under Section 564(b)(1) of the Act, 21  U.S.C. section 360bbb-3(b)(1), unless the authorization is  terminated or revoked. Performed at [facility], [address] [place], [place] [address]   MRSA PCR Screening     Status: None   Collection Time: 05/09/2019  6:27 PM   Specimen: Nasopharyngeal  Result Value Ref Range Status   MRSA by PCR NEGATIVE NEGATIVE Final    Comment:        The GeneXpert MRSA Assay (FDA approved for NASAL specimens only), is one component of a comprehensive MRSA colonization surveillance program. It is not intended to diagnose MRSA infection nor to guide or monitor treatment for MRSA infections. Performed at [facility], [address] [place], [place] [address]   Urine culture     Status: Abnormal   Collection Time: 05/21/2019 10:30 PM   Specimen: Urine, Clean Catch  Result Value Ref Range Status   Specimen Description   Final    URINE, CLEAN CATCH Performed at [facility], [address], [place], [place] [address]    Special Requests   Final    NONE Performed at [facility],  [place], [place] [address]    Culture (A)  Final    >=100,000 COLONIES/mL KLEBSIELLA PNEUMONIAE >=100,000 COLONIES/mL VANCOMYCIN RESISTANT ENTEROCOCCUS    Report Status 05/14/2019 FINAL  Final   Organism ID, Bacteria KLEBSIELLA PNEUMONIAE (A)  Final   Organism ID, Bacteria VANCOMYCIN RESISTANT ENTEROCOCCUS (A)  Final      Susceptibility   Klebsiella pneumoniae - MIC*    AMPICILLIN >=32 RESISTANT Resistant     CEFAZOLIN <=4 SENSITIVE Sensitive     CEFTRIAXONE <=0.25 SENSITIVE Sensitive     CIPROFLOXACIN <=0.25 SENSITIVE Sensitive     GENTAMICIN <=1 SENSITIVE Sensitive     IMIPENEM 0.5 SENSITIVE Sensitive  NITROFURANTOIN 64 INTERMEDIATE Intermediate     TRIMETH/SULFA <=20 SENSITIVE Sensitive     AMPICILLIN/SULBACTAM 4 SENSITIVE Sensitive     PIP/TAZO <=4 SENSITIVE Sensitive     * >=100,000 COLONIES/mL KLEBSIELLA PNEUMONIAE   Vancomycin resistant enterococcus - MIC*    AMPICILLIN >=32 RESISTANT Resistant     NITROFURANTOIN 256 RESISTANT Resistant     VANCOMYCIN >=32 RESISTANT Resistant     LINEZOLID 2 SENSITIVE Sensitive     * >=100,000 COLONIES/mL VANCOMYCIN RESISTANT ENTEROCOCCUS  Cath Tip Culture     Status: Abnormal   Collection Time: 05/11/19  1:10 PM   Specimen: Catheter Tip; Other  Result Value Ref Range Status   Specimen Description   Final    CATH TIP Performed at [facility], [address] [place], [place] [address]    Special Requests   Final    NONE Performed at [facility], [address] [place], [place] [address]    Culture (A)  Final    >=100,000 COLONIES/mL METHICILLIN RESISTANT STAPHYLOCOCCUS AUREUS   Report Status 05/13/2019 FINAL  Final   Organism ID, Bacteria METHICILLIN RESISTANT STAPHYLOCOCCUS AUREUS (A)  Final      Susceptibility   Methicillin resistant staphylococcus aureus - MIC*    CIPROFLOXACIN >=8 RESISTANT Resistant     ERYTHROMYCIN >=8 RESISTANT Resistant     GENTAMICIN <=0.5 SENSITIVE Sensitive      OXACILLIN >=4 RESISTANT Resistant     TETRACYCLINE <=1 SENSITIVE Sensitive     VANCOMYCIN 1 SENSITIVE Sensitive     TRIMETH/SULFA >=320 RESISTANT Resistant     CLINDAMYCIN <=0.25 SENSITIVE Sensitive     RIFAMPIN <=0.5 SENSITIVE Sensitive     Inducible Clindamycin NEGATIVE Sensitive     * >=100,000 COLONIES/mL METHICILLIN RESISTANT STAPHYLOCOCCUS AUREUS  Culture, blood (routine x 2)     Status: Abnormal   Collection Time: 05/11/19  4:27 PM   Specimen: BLOOD  Result Value Ref Range Status   Specimen Description   Final    BLOOD A-LINE Performed at [facility], [address] [place], [place] [address]    Special Requests   Final    BOTTLES DRAWN AEROBIC AND ANAEROBIC Blood Culture adequate volume Performed at [facility], [address] [place], [place] [address]    Culture  Setup Time   Final    GRAM POSITIVE COCCI ANAEROBIC BOTTLE ONLY CRITICAL VALUE NOTED.  VALUE IS CONSISTENT WITH PREVIOUSLY REPORTED AND CALLED VALUE. Performed at [facility], [address] [place], [place] [address]    Culture (A)  Final    STAPHYLOCOCCUS AUREUS SUSCEPTIBILITIES PERFORMED ON PREVIOUS CULTURE WITHIN THE LAST 5 DAYS. Performed at [facility], [place] [address] [place], [place] [address]    Report Status 05/23/2019 FINAL  Final  Culture, blood (routine x 2)     Status: Abnormal   Collection Time: 05/12/19  6:06 AM   Specimen: BLOOD  Result Value Ref Range Status   Specimen Description   Final    BLOOD BLOOD RIGHT HAND Performed at [facility], [address] [place], [place] [address]    Special Requests   Final    BOTTLES DRAWN AEROBIC AND ANAEROBIC Blood Culture adequate volume Performed at [facility], [address] [place], [place] [address]    Culture  Setup Time   Final    GRAM POSITIVE COCCI ANAEROBIC BOTTLE ONLY CRITICAL RESULT CALLED TO, READ BACK BY AND VERIFIED WITH: [place]  05/12/2019 BY  MOSLEY,J Performed at [facility], [address] [place], [place] [address]    Culture METHICILLIN RESISTANT STAPHYLOCOCCUS AUREUS (A)  Final   Report Status 05/13/2019 FINAL  Final   Organism ID, Bacteria METHICILLIN RESISTANT STAPHYLOCOCCUS AUREUS  Final      Susceptibility   Methicillin resistant staphylococcus aureus - MIC*    CIPROFLOXACIN >=8 RESISTANT Resistant     ERYTHROMYCIN >=8 RESISTANT Resistant     GENTAMICIN <=0.5 SENSITIVE Sensitive     OXACILLIN >=4 RESISTANT Resistant     TETRACYCLINE <=1 SENSITIVE Sensitive     VANCOMYCIN 1 SENSITIVE Sensitive     TRIMETH/SULFA >=320 RESISTANT Resistant     CLINDAMYCIN <=0.25 SENSITIVE Sensitive     RIFAMPIN <=0.5 SENSITIVE Sensitive     Inducible Clindamycin NEGATIVE Sensitive     * METHICILLIN RESISTANT STAPHYLOCOCCUS AUREUS  CULTURE, BLOOD (ROUTINE X 2) w Reflex to ID Panel     Status: Abnormal   Collection Time: 05/13/19  2:20 PM   Specimen: BLOOD  Result Value Ref Range Status   Specimen Description   Final    BLOOD BLOOD RIGHT HAND Performed at [facility], [address], [place], [place] [address]    Special Requests   Final    BOTTLES DRAWN AEROBIC AND ANAEROBIC Blood Culture adequate volume Performed at [facility], [address] [place], [place] [address]    Culture  Setup Time   Final    GRAM POSITIVE COCCI ANAEROBIC BOTTLE ONLY CRITICAL RESULT CALLED TO, READ BACK BY AND VERIFIED WITH: [facility] KATSOUDAS AT 9937 05/14/19 SDR Performed at [facility], [address] [place], [place] [address]    Culture (A)  Final    STAPHYLOCOCCUS AUREUS SUSCEPTIBILITIES PERFORMED ON PREVIOUS CULTURE WITHIN THE LAST 5 DAYS. Performed at [facility], [place] [address], [place], [place] [address]    Report Status 05/16/2019 FINAL  Final  CULTURE, BLOOD (ROUTINE X 2) w Reflex to ID Panel     Status: None (Preliminary result)   Collection Time: 05/13/19  3:39 PM   Specimen:  BLOOD  Result Value Ref Range Status   Specimen Description BLOOD BLOOD LEFT HAND  Final   Special Requests   Final    BOTTLES DRAWN AEROBIC ONLY Blood Culture adequate volume   Culture   Final    NO GROWTH 4 DAYS Performed at [facility], [address] [place], [place] [address]    Report Status PENDING  Incomplete  CULTURE, BLOOD (ROUTINE X 2) w Reflex to ID Panel     Status: Abnormal (Preliminary result)   Collection Time: 05/14/19  3:33 PM   Specimen: BLOOD  Result Value Ref Range Status   Specimen Description   Final    BLOOD BLOOD RIGHT HAND Performed at [facility], [address], [place], [place] [address]    Special Requests   Final    BOTTLES DRAWN AEROBIC AND ANAEROBIC Blood Culture adequate volume Performed at [facility], [address] [place], [place] [address]    Culture  Setup Time   Final    GRAM POSITIVE COCCI IN CLUSTERS ANAEROBIC BOTTLE ONLY CRITICAL VALUE NOTED.  VALUE IS CONSISTENT WITH PREVIOUSLY REPORTED AND CALLED VALUE.    Culture (A)  Final    STAPHYLOCOCCUS AUREUS SUSCEPTIBILITIES PERFORMED ON PREVIOUS CULTURE WITHIN THE LAST 5 DAYS. Performed at [facility], [place] [address], [place], [place] [address]    Report Status  PENDING  Incomplete  Cath Tip Culture     Status: None (Preliminary result)   Collection Time: 05/14/19  4:24 PM   Specimen: Catheter Tip; Other  Result Value Ref Range Status   Specimen Description   Final    CATH TIP Performed at [facility], [address], [place], [place] [address]    Special Requests   Final    NONE Performed at [facility], [address] [place], [place] [address]    Culture   Final    NO GROWTH 2 DAYS Performed at [facility], [place] [address], [place], [place] [address]    Report Status PENDING  Incomplete  CULTURE, BLOOD (ROUTINE X 2) w Reflex to ID Panel     Status: Abnormal   Collection Time: 05/14/19  6:25 PM   Specimen:  BLOOD  Result Value Ref Range Status   Specimen Description   Final    BLOOD BLOOD LEFT HAND Performed at [facility], [address] [place], [place] [address]    Special Requests   Final    BOTTLES DRAWN AEROBIC AND ANAEROBIC Blood Culture results may not be optimal due to an inadequate volume of blood received in culture bottles Performed at [facility], [address] [place], [place] [address]    Culture  Setup Time   Final    GRAM POSITIVE COCCI ANAEROBIC BOTTLE ONLY CRITICAL VALUE NOTED.  VALUE IS CONSISTENT WITH PREVIOUSLY REPORTED AND CALLED VALUE. [place] Performed at [facility], [address] [place], [place] [address]    Culture (A)  Final    STAPHYLOCOCCUS AUREUS SUSCEPTIBILITIES PERFORMED ON PREVIOUS CULTURE WITHIN THE LAST 5 DAYS. Performed at [facility], [place] [address], [place], [place] [address]    Report Status 05/17/2019 FINAL  Final  Culture, blood (Routine X 2) w Reflex to ID Panel     Status: None (Preliminary result)   Collection Time: 05/16/19 10:45 AM   Specimen: BLOOD  Result Value Ref Range Status   Specimen Description BLOOD RIGHT HAND  Final   Special Requests BLOOD Blood Culture adequate volume  Final   Culture   Final    NO GROWTH < 24 HOURS Performed at [facility], [address], [place],  [address]    Report Status PENDING  Incomplete  Culture, blood (Routine X 2) w Reflex to ID Panel     Status: None (Preliminary result)   Collection Time: 05/16/19 12:38 PM   Specimen: BLOOD  Result Value Ref Range Status   Specimen Description BLOOD RT HAND  Final   Special Requests   Final    BOTTLES DRAWN AEROBIC AND ANAEROBIC Blood Culture adequate volume   Culture   Final    NO GROWTH < 24 HOURS Performed at [facility], [address] [place],  [address]    Report Status PENDING  Incomplete    Coagulation Studies: No results for input(s): LABPROT, INR in the last 72  hours.  Urinalysis: No results for input(s): COLORURINE, LABSPEC, PHURINE, GLUCOSEU, HGBUR, BILIRUBINUR, KETONESUR, PROTEINUR, UROBILINOGEN, NITRITE, LEUKOCYTESUR in the last 72 hours.  Invalid input(s): APPERANCEUR    Imaging: PERIPHERAL VASCULAR CATHETERIZATION  Result Date: 06/02/2019 See op note    Medications:   . DAPTOmycin (CUBICIN)  IV Stopped (05/16/19 2102)  . levETIRAcetam Stopped (05/16/19 1753)   . amLODipine  10 mg Oral Daily  . atorvastatin  10 mg Oral q1800  . carvedilol  25 mg Oral BID WC  . Chlorhexidine Gluconate Cloth  6 each Topical Daily  . feeding supplement (ENSURE ENLIVE)  237 mL Oral TID  . insulin aspart  0-9 Units Subcutaneous Q4H  . midodrine  5 mg Oral Q M,W,F  . pantoprazole (PROTONIX) IV  40 mg Intravenous QHS  . vancomycin variable dose per unstable renal function (pharmacist dosing)   Does not apply See admin instructions     so far Assessment/ Plan:  Ms. Raenell Mensing is a 74 y.o. black female Ms. Lavelle Berland is a 74 y.o. black female with end stage renal disease on hemodialysis, CVA with left hemiparesis, hypertension, hyperlipidemia, GERD, glaucoma, diabetes mellitus type II, coronary artery disease, gout, seizure disorder who was [facility] to [facility]16[facility]2019 for Hypokalemia [E87.6] Shock ([place]) [R57.9] ESRD (end stage renal disease) ([place]) [N18.6] Hypotension, unspecified hypotension type [I95.9] Anemia, unspecified type [D64.9]  [facility] MWF  63.5kg  # End Stage Renal Disease: last hemodialysis treatment was Friday 4/2. No acute indication for dialysis.  Appreciate vascular input - temp HD catheter removed  - Plan on permcath placement when blood culture are neg.  - For now, will monitor daily for dialysis need and place temp HD catheter  If needed  # Hypotension: with MRSA bacteremia/sepsis weaned off vasopressors: Persistent positive blood cultures from 4/7.   Neg results from 4/9 so far - Continue vancomycin and  daptomycin. As per ID recommendations    #. Anemia with chronic kidney disease: with iron deficiency and folate deficiency. Subcu EPO on 4/7.  Lab Results  Component Value Date   HGB 7.8 (L) 05/17/2019    # Secondary Hyperparathyroidism:  Off binders at present Lab Results  Component Value Date   PTH 232 (H) 05/11/2019   CALCIUM 8.7 (L) 05/17/2019   PHOS 1.3 (L) 05/16/2019    #Malnutrition:   Currently on pureed diet Status post IV albumin replacement.      LOS: 7 Crystal Vargas 4/10/202110:10 AM

## 2019-05-17 NOTE — Progress Notes (Signed)
Initial Nutrition Assessment  DOCUMENTATION CODES:   Not applicable  INTERVENTION:   If intake continues to be inadequate recommend placement of small bore feeding tube and initiation of EN if within [place].    Ensure Enlive po TID, each supplement provides 350 kcal and 20 grams of protein thickened to nectar consistency  Magic cup TID with meals, each supplement provides 290 kcal and 9 grams of protein  Renal MVI  NUTRITION DIAGNOSIS:   Inadequate oral intake related to poor appetite, dysphagia as evidenced by meal completion < 50%.  GOAL:   Patient will meet greater than or equal to 90% of their needs  MONITOR:   PO intake, Supplement acceptance, Weight trends, Labs, I & O's, Diet advancement, Skin  REASON FOR ASSESSMENT:   Consult Poor PO  ASSESSMENT:   Patient with PMH significant for ESRD on HD, dysphagia due to Schatzki's ring with past dilations, CAD, DM, GERD, HLD, and HTN. Presents this admission with MRSA bacteremia.   4/4- s/p removal of infected R [facility] 4/8- s/p HD cath R femoral  Unable to reach any one in room. Attempted to reach RN multiple times. Last four meals documented as 0-50% (20% average).  Per [facility] pt was provided with Ensure twice today. Recommend continuing supplements thickened to nectar consistency. If PO intake continues to be poor recommend placement of small bore feeding tube and initiation of EN as pt is day 7 without adequate nutrition.   EDW: 63.5 kg  Current weight: 80.1 kg   I/O: +6,823 ml since admit  Last HD 4/8: 0 ml net UF  Medications: SS novolog Labs: corrected calcium 10.7 (H) CBG 62-195  Diet Order:   Diet Order            DIET - DYS 1 Room service appropriate? Yes with Assist; Fluid consistency: Nectar Thick  Diet effective now              EDUCATION NEEDS:   Not appropriate for education at this time  Skin:  Skin Assessment: Skin Integrity Issues: Skin Integrity Issues:: Unstageable, Other  (Comment) Unstageable: L heel Other: L toe  Last BM:  4/6  Height:   Ht Readings from Last 1 Encounters:  06/06/2019 5\' 2"  (1.575 m)    Weight:   Wt Readings from Last 1 Encounters:  05/17/19 80.1 kg    BMI:  Body mass index is 32.3 kg/m.  Estimated Nutritional Needs:   Kcal:  1750-1950 kcal  Protein:  90-105 grams  Fluid:  1000 ml + UOP   Mariana Single RD, LDN Clinical Nutrition Pager listed in [place]

## 2019-05-18 DIAGNOSIS — B9562 Methicillin resistant Staphylococcus aureus infection as the cause of diseases classified elsewhere: Secondary | ICD-10-CM | POA: Diagnosis not present

## 2019-05-18 DIAGNOSIS — N186 End stage renal disease: Secondary | ICD-10-CM | POA: Diagnosis not present

## 2019-05-18 DIAGNOSIS — R7881 Bacteremia: Secondary | ICD-10-CM | POA: Diagnosis not present

## 2019-05-18 DIAGNOSIS — E43 Unspecified severe protein-calorie malnutrition: Secondary | ICD-10-CM | POA: Diagnosis not present

## 2019-05-18 LAB — COMPREHENSIVE METABOLIC PANEL
ALT: 17 U/L (ref 0–44)
AST: 18 U/L (ref 15–41)
Albumin: 2 g/dL — ABNORMAL LOW (ref 3.5–5.0)
Alkaline Phosphatase: 72 U/L (ref 38–126)
Anion gap: 9 (ref 5–15)
BUN: 43 mg/dL — ABNORMAL HIGH (ref 8–23)
CO2: 25 mmol/L (ref 22–32)
Calcium: 8.8 mg/dL — ABNORMAL LOW (ref 8.9–10.3)
Chloride: 111 mmol/L (ref 98–111)
Creatinine, Ser: 3.24 mg/dL — ABNORMAL HIGH (ref 0.44–1.00)
GFR calc Af Amer: 15 mL/min — ABNORMAL LOW (ref >60)
GFR calc non Af Amer: 13 mL/min — ABNORMAL LOW (ref >60)
Glucose, Bld: 101 mg/dL — ABNORMAL HIGH (ref 70–99)
Potassium: 3.5 mmol/L (ref 3.5–5.1)
Sodium: 145 mmol/L (ref 135–145)
Total Bilirubin: 1.5 mg/dL — ABNORMAL HIGH (ref 0.3–1.2)
Total Protein: 5 g/dL — ABNORMAL LOW (ref 6.5–8.1)

## 2019-05-18 LAB — CBC
HCT: 20.7 % — ABNORMAL LOW (ref 36.0–46.0)
Hemoglobin: 7.1 g/dL — ABNORMAL LOW (ref 12.0–15.0)
MCH: 32.1 pg (ref 26.0–34.0)
MCHC: 34.3 g/dL (ref 30.0–36.0)
MCV: 93.7 fL (ref 80.0–100.0)
Platelets: 110 10*3/uL — ABNORMAL LOW (ref 150–400)
RBC: 2.21 MIL/uL — ABNORMAL LOW (ref 3.87–5.11)
RDW: 18.6 % — ABNORMAL HIGH (ref 11.5–15.5)
WBC: 9.5 10*3/uL (ref 4.0–10.5)
nRBC: 0 % (ref 0.0–0.2)

## 2019-05-18 LAB — CULTURE, BLOOD (ROUTINE X 2)
Culture: NO GROWTH
Special Requests: ADEQUATE

## 2019-05-18 LAB — GLUCOSE, CAPILLARY
Glucose-Capillary: 114 mg/dL — ABNORMAL HIGH (ref 70–99)
Glucose-Capillary: 116 mg/dL — ABNORMAL HIGH (ref 70–99)
Glucose-Capillary: 124 mg/dL — ABNORMAL HIGH (ref 70–99)
Glucose-Capillary: 125 mg/dL — ABNORMAL HIGH (ref 70–99)
Glucose-Capillary: 142 mg/dL — ABNORMAL HIGH (ref 70–99)
Glucose-Capillary: 219 mg/dL — ABNORMAL HIGH (ref 70–99)
Glucose-Capillary: 93 mg/dL (ref 70–99)

## 2019-05-18 MED ORDER — SODIUM CHLORIDE 0.9 % IV SOLN
INTRAVENOUS | Status: DC | PRN
  Administered 2019-05-18: 30 mL via INTRAVENOUS
  Administered 2019-05-21 (×2): 50 mL via INTRAVENOUS
  Administered 2019-05-24 (×3): 30 mL via INTRAVENOUS
  Administered 2019-05-24: 250 mL via INTRAVENOUS
  Administered 2019-05-25: 20 mL via INTRAVENOUS
  Administered 2019-05-25: 30 mL via INTRAVENOUS
  Administered 2019-05-26 (×2): 20 mL via INTRAVENOUS
  Administered 2019-05-29 – 2019-05-31 (×4): 250 mL via INTRAVENOUS
  Administered 2019-06-01: 09:00:00 500 mL via INTRAVENOUS

## 2019-05-18 NOTE — NC FL2 (Signed)
[place] LEVEL OF CARE SCREENING TOOL     IDENTIFICATION  Patient Name: Crystal Vargas Birthdate: [birth year] Sex: female Admission Date (Current Location): 05/31/2019  [place] and [place] Number:  [name] [phone] [place] and Address:  [facility], [address], [place], [place] [address]      Provider Number: [id number]  Attending Physician Name and Address:  Wyvonnia Dusky, MD  Relative Name and Phone Number:  Jatia, Musa Daughter, [phone]    Current Level of Care: Hospital Recommended Level of Care: [place] Prior Approval Number:    Date Approved/Denied:   PASRR Number: [id number]  Discharge Plan: SNF    Current Diagnoses: Patient Active Problem List   Diagnosis Date Noted  . MRSA bacteremia 05/11/2019  . Status post CVA 05/11/2019  . Schatzki's ring 05/11/2019  . Shock ([place]) 05/13/2019  . Diabetes ([place]) 04/15/2019  . Essential hypertension 04/15/2019  . ESRD on dialysis ([place]) 04/15/2019    Orientation RESPIRATION BLADDER Height & Weight     Self  Normal Continent Weight: 72 kg Height:  5\' 2"  (157.5 cm)  BEHAVIORAL SYMPTOMS/MOOD NEUROLOGICAL BOWEL NUTRITION STATUS      Continent Diet(Dysphasia 1 diet, nectar thick liquids, total assistance)  AMBULATORY STATUS COMMUNICATION OF NEEDS Skin   Total Care Verbally Skin abrasions(MASD on buttocks, pressure injury on heel and toe (no dressings required))                       Personal Care Assistance Level of Assistance  Bathing, Feeding, Dressing Bathing Assistance: Maximum assistance Feeding assistance: Maximum assistance Dressing Assistance: Maximum assistance     Functional Limitations Info  Sight, Hearing, Speech Sight Info: Adequate Hearing Info: Adequate Speech Info: Adequate    SPECIAL CARE FACTORS FREQUENCY  PT (By licensed PT), OT (By licensed OT), Speech therapy     PT Frequency: 5x/wk OT Frequency: 5x/wk     Speech  Therapy Frequency: 3x/wk for 2 weeks      Contractures Contractures Info: Not present    Additional Factors Info  Code Status, Allergies, Insulin Sliding Scale Code Status Info: Full Code Allergies Info: Abacavir, Cephalosporins, Ciprofloxacin, Nsaids, Penicillin G, Quinolones, Salicylates   Insulin Sliding Scale Info: insulin aspart novolog 0-9 units every 4 hours       Current Medications (05/18/2019):  This is the current hospital active medication list Current Facility-Administered Medications  Medication Dose Route Frequency Provider Last Rate Last Admin  . amLODipine (NORVASC) tablet 10 mg  10 mg Oral Daily Wyvonnia Dusky, MD   10 mg at 05/18/19 0932  . atorvastatin (LIPITOR) tablet 10 mg  10 mg Oral q1800 Wyvonnia Dusky, MD   10 mg at 05/17/19 1627  . carvedilol (COREG) tablet 25 mg  25 mg Oral BID WC Wyvonnia Dusky, MD   25 mg at 05/18/19 0931  . Chlorhexidine Gluconate Cloth 2 % PADS 6 each  6 each Topical Daily Tyler Pita, MD   6 each at 05/17/19 1042  . DAPTOmycin (CUBICIN) 500 mg in sodium chloride 0.9 % IVPB  500 mg Intravenous Q48H Charlett Nose, RPH   Stopped at 05/16/19 2102  . feeding supplement (ENSURE ENLIVE) (ENSURE ENLIVE) liquid 237 mL  237 mL Oral TID Wyvonnia Dusky, MD   237 mL at 05/18/19 0930  . insulin aspart (novoLOG) injection 0-9 Units  0-9 Units Subcutaneous Q4H Darel Hong D, NP   3 Units at 05/18/19 0037  .  levETIRAcetam (KEPPRA) IVPB 500 mg/100 mL premix  500 mg Intravenous q1800 Charlett Nose, [facility]   Stopped at 05/17/19 1644  . midodrine (PROAMATINE) tablet 5 mg  5 mg Oral Q M,W,F Wyvonnia Dusky, MD      . morphine 2 MG/ML injection 1-2 mg  1-2 mg Intravenous Q4H PRN Bradly Bienenstock, NP   2 mg at 05/16/19 1368  . pantoprazole (PROTONIX) injection 40 mg  40 mg Intravenous QHS Flora Lipps, MD   40 mg at 05/17/19 2310  . vancomycin variable dose per unstable renal function (pharmacist dosing)   Does not apply  See admin instructions Charlett Nose, [facility]         Discharge Medications: Please see discharge summary for a list of discharge medications.  Relevant Imaging Results:  Relevant Lab Results:   Additional Information SSN: [id number]; COVID negative on 06/02/2019  Suzane Vanderweide B Glade Strausser, LCSWA

## 2019-05-18 NOTE — Progress Notes (Signed)
3 Days Post-Op   Subjective/Chief Complaint: Patient sleeping.   Objective: Vital signs in last 24 hours: Temp:  [98.3 F (36.8 C)-99.4 F (37.4 C)] 98.4 F (36.9 C) (04/11 0813) Pulse Rate:  [59-70] 62 (04/11 0813) Resp:  [14-25] 22 (04/11 0813) BP: (106-139)/(39-79) 120/57 (04/11 0813) SpO2:  [92 %-97 %] 92 % (04/11 0813) Weight:  [72 kg] 72 kg (04/11 0451) Last BM Date: (Unknown)  Intake/Output from previous day: 04/10 0701 - 04/11 0700 In: 490 [P.O.:180; IV Piggyback:310] Out: 0  Intake/Output this shift: No intake/output data recorded.  General appearance: Appears Ill Resp: clear to auscultation bilaterally Cardio: regular rate and rhythm  Lab Results:  Recent Labs    05/17/19 0423 05/18/19 0751  WBC 9.2 9.5  HGB 7.8* 7.1*  HCT 23.1* 20.7*  PLT 48* 110*   BMET Recent Labs    05/17/19 0423 05/18/19 0751  NA 142 145  K 3.5 3.5  CL 110 111  CO2 24 25  GLUCOSE 96 101*  BUN 34* 43*  CREATININE 2.66* 3.24*  CALCIUM 8.7* 8.8*   PT/INR No results for input(s): LABPROT, INR in the last 72 hours. ABG No results for input(s): PHART, HCO3 in the last 72 hours.  Invalid input(s): PCO2, PO2  Studies/Results: No results found.  Anti-infectives: Anti-infectives (From admission, onward)   Start     Dose/Rate Route Frequency Ordered Stop   05/18/2019 1200  vancomycin (VANCOREADY) IVPB 750 mg/150 mL     750 mg 150 mL/hr over 60 Minutes Intravenous Every Thu (Hemodialysis) 05/26/2019 1126 05/11/2019 1809   05/14/19 2000  DAPTOmycin (CUBICIN) 500 mg in sodium chloride 0.9 % IVPB     500 mg 220 mL/hr over 30 Minutes Intravenous Every 48 hours 05/14/19 1550     05/12/19 1645  vancomycin (VANCOREADY) IVPB 750 mg/150 mL     750 mg 150 mL/hr over 60 Minutes Intravenous  Once 05/12/19 1635 05/12/19 1819   05/12/19 1358  vancomycin variable dose per unstable renal function (pharmacist dosing)      Does not apply See admin instructions 05/12/19 1358     05/12/19 1200   vancomycin (VANCOREADY) IVPB 500 mg/100 mL  Status:  Discontinued     500 mg 100 mL/hr over 60 Minutes Intravenous Every M-W-F (Hemodialysis) 05/09/2019 2016 05/18/2019 2018   05/12/19 1200  vancomycin (VANCOREADY) IVPB 750 mg/150 mL  Status:  Discontinued     750 mg 150 mL/hr over 60 Minutes Intravenous Every M-W-F (Hemodialysis) 05/19/2019 2018 05/12/19 1358   05/17/2019 2100  aztreonam (AZACTAM) 1 g in sodium chloride 0.9 % 100 mL IVPB  Status:  Discontinued     1 g 200 mL/hr over 30 Minutes Intravenous Every 24 hours 05/17/2019 2016 05/11/19 1019   05/10/2019 1500  aztreonam (AZACTAM) injection 2 g  Status:  Discontinued     2 g Intramuscular Once 05/28/2019 1411 05/24/2019 1442   06/04/2019 1445  aztreonam (AZACTAM) 2 g in sodium chloride 0.9 % 100 mL IVPB  Status:  Discontinued     2 g 200 mL/hr over 30 Minutes Intravenous  Once 06/05/2019 1442 06/02/2019 2016   05/25/2019 1430  vancomycin (VANCOREADY) IVPB 1250 mg/250 mL     1,250 mg 166.7 mL/hr over 90 Minutes Intravenous  Once 05/24/2019 1411 05/10/2019 1635      Assessment/Plan: s/p Procedure(s): TEMPORARY DIALYSIS CATHETER (N/A)  Sepsis Last  Positive blood culture 4/7. Recent blood culture on 4/9 remains negative to date. Will tentatively plan for PermCath tomorrow if  cultures remain negative tomorrow. Will Preop for such.  LOS: 8 days    Crystal Vargas 05/18/2019

## 2019-05-18 NOTE — TOC Progression Note (Signed)
Transition of Care [facility]) - Progression Note    Patient Details  Name: Crystal Vargas MRN: [id number] Date of Birth: 1945/04/20  Transition of Care [facility]) CM/SW [place], [place] Phone Number: 05/18/2019, 10:22 AM  Clinical Narrative:     Patient has been recommended for rehab. CSW called and spoke with the patient's daughter, [name]. [name] is agreeable for her mother to return to [facility] and start rehab. CSW informed her that we would have to start insurance authorization.   CSW faxed referral to [facility]. CSW called [facility]. Patient is managed by regular [employer].   CSW called [facility] [place], no one is working in admissions over the weekend. CSW will need to call Monday and start insurance authorization.   Expected Discharge Plan: [place] Barriers to Discharge: Continued Medical Work up  Expected Discharge Plan and Services Expected Discharge Plan: [place] arrangements for the past 2 months: [place]                                       Social Determinants of Health (SDOH) Interventions    Readmission Risk Interventions No flowsheet data found.

## 2019-05-18 NOTE — Progress Notes (Signed)
[facility]  ROUNDING NOTE   Subjective:   Doing fair Appetite appears to be poor Supposed to get Pureed diet  Objective:  Vital signs in last 24 hours:  Temp:  [98.3 F (36.8 C)-99.4 F (37.4 C)] 98.4 F (36.9 C) (04/11 1230) Pulse Rate:  [60-70] 63 (04/11 1230) Resp:  [14-25] 20 (04/11 1230) BP: (106-141)/(42-79) 141/52 (04/11 1230) SpO2:  [91 %-97 %] 91 % (04/11 1230) Weight:  [72 kg] 72 kg (04/11 0451)  Weight change: -8.1 kg Filed Weights   05/16/19 0500 05/17/19 0500 05/18/19 0451  Weight: 68.2 kg 80.1 kg 72 kg    Intake/Output: I/O last 3 completed shifts: In: 83 [P.O.:180; IV Piggyback:310] Out: 0    Intake/Output this shift:  Total I/O In: 90 [P.O.:90] Out: -   Physical Exam: General: Ill appearing  Head: Normocephalic, atraumatic.   Eyes: Anicteric,   Neck: Supple,   Lungs:  Clear, room air  Heart: Regular rate and rhythm  Abdomen:  Soft, nontender   Extremities:  no peripheral edema. Feet in b.l soft supports  Neurologic: Left sided weakness.  Alert to self    Skin: No lesions  Access: none    Basic Metabolic Panel: Recent Labs  Lab 05/12/19 0502 05/13/19 0556 05/14/19 1051 05/14/19 1051 06/04/2019 0633 06/06/2019 0633 05/16/19 0327 05/17/19 0423 05/18/19 0751  NA 135   < > 143  --  141  --  142 142 145  K 4.2   < > 4.3  --  3.7  --  3.5 3.5 3.5  CL 103   < > 112*  --  110  --  108 110 111  CO2 21*   < > 16*  --  19*  --  26 24 25   GLUCOSE 157*   < > 123*  --  117*  --  269* 96 101*  BUN 30*   < > 44*  --  48*  --  24* 34* 43*  CREATININE 2.47*   < > 3.54*  --  3.76*  --  2.16* 2.66* 3.24*  CALCIUM 8.5*   < > 9.4   < > 9.3   < > 8.8* 8.7* 8.8*  MG 2.0  --   --   --   --   --   --   --   --   PHOS 3.6  --  3.1  --   --   --  1.3*  --   --    < > = values in this interval not displayed.    Liver Function Tests: Recent Labs  Lab 05/14/19 1051 05/16/19 0327 05/17/19 0423 05/18/19 0751  AST  --   --  22 18  ALT  --    --  19 17  ALKPHOS  --   --  78 72  BILITOT  --   --  1.7* 1.5*  PROT  --   --  4.9* 5.0*  ALBUMIN 2.7* 2.3* 2.1* 2.0*   No results for input(s): LIPASE, AMYLASE in the last 168 hours. No results for input(s): AMMONIA in the last 168 hours.  CBC: Recent Labs  Lab 05/14/19 1051 06/01/2019 0757 05/16/19 0327 05/17/19 0423 05/18/19 0751  WBC 6.5 7.7 9.5 9.2 9.5  HGB 8.3* 8.9* 8.3* 7.8* 7.1*  HCT 22.2* 25.5* 23.7* 23.1* 20.7*  MCV 86.4 93.4 90.5 94.3 93.7  PLT 59* 51* 70* 48* 110*    Cardiac Enzymes: Recent Labs  Lab 05/21/2019 0633  CKTOTAL  10*    BNP: Invalid input(s): POCBNP  CBG: Recent Labs  Lab 05/17/19 2035 05/18/19 0024 05/18/19 0431 05/18/19 0804 05/18/19 1227  GLUCAP 277* 219* 114* 93 124*    Microbiology: Results for orders placed or performed during the hospital encounter of 05/09/2019  Blood culture (routine x 2)     Status: Abnormal   Collection Time: 06/05/2019 12:34 PM   Specimen: BLOOD  Result Value Ref Range Status   Specimen Description   Final    BLOOD R UP ARM Performed at [facility], [address], [place], [place] [address]    Special Requests   Final    BOTTLES DRAWN AEROBIC AND ANAEROBIC Blood Culture adequate volume Performed at [facility], [address] [place], [place] [address]    Culture  Setup Time   Final    Organism ID to follow IN [place] TO, READ BACK BY AND VERIFIED WITH: [place] ON 05/11/19 AT 0210 [facility] Performed at [facility], [address] [place], [place] [address]    Culture (A)  Final    STAPHYLOCOCCUS AUREUS SUSCEPTIBILITIES PERFORMED ON PREVIOUS CULTURE WITHIN THE LAST 5 DAYS. Performed at [facility], [place] [address], [place], [place] [address]    Report Status 05/13/2019 FINAL  Final  Blood Culture ID Panel (Reflexed)     Status: Abnormal   Collection Time: 06/05/2019 12:34 PM  Result Value Ref  Range Status   Enterococcus species NOT DETECTED NOT DETECTED Final   Listeria monocytogenes NOT DETECTED NOT DETECTED Final   Staphylococcus species DETECTED (A) NOT DETECTED Final    Comment: CRITICAL RESULT CALLED TO, READ BACK BY AND VERIFIED WITH: SCOTT HALL ON 05/11/19 AT 0210 [facility]    Staphylococcus aureus (BCID) DETECTED (A) NOT DETECTED Final    Comment: Methicillin (oxacillin)-resistant Staphylococcus aureus (MRSA). MRSA is predictably resistant to beta-lactam antibiotics (except ceftaroline). Preferred therapy is vancomycin unless clinically contraindicated. Patient requires contact precautions if  hospitalized. CRITICAL RESULT CALLED TO, READ BACK BY AND VERIFIED WITH: SCOTT HALL ON 05/11/19 AT 0210 [facility]    Methicillin resistance DETECTED (A) NOT DETECTED Final    Comment: CRITICAL RESULT CALLED TO, READ BACK BY AND VERIFIED WITH: SCOTT HALL ON 05/11/19 AT 0210 [place]    Streptococcus species NOT DETECTED NOT DETECTED Final   Streptococcus agalactiae NOT DETECTED NOT DETECTED Final   Streptococcus pneumoniae NOT DETECTED NOT DETECTED Final   Streptococcus pyogenes NOT DETECTED NOT DETECTED Final   Acinetobacter baumannii NOT DETECTED NOT DETECTED Final   Enterobacteriaceae species NOT DETECTED NOT DETECTED Final   Enterobacter cloacae complex NOT DETECTED NOT DETECTED Final   Escherichia coli NOT DETECTED NOT DETECTED Final   Klebsiella oxytoca NOT DETECTED NOT DETECTED Final   Klebsiella pneumoniae NOT DETECTED NOT DETECTED Final   Proteus species NOT DETECTED NOT DETECTED Final   Serratia marcescens NOT DETECTED NOT DETECTED Final   Haemophilus influenzae NOT DETECTED NOT DETECTED Final   Neisseria meningitidis NOT DETECTED NOT DETECTED Final   Pseudomonas aeruginosa NOT DETECTED NOT DETECTED Final   Candida albicans NOT DETECTED NOT DETECTED Final   Candida glabrata NOT DETECTED NOT DETECTED Final   Candida krusei NOT DETECTED NOT DETECTED Final   Candida parapsilosis NOT  DETECTED NOT DETECTED Final   Candida tropicalis NOT DETECTED NOT DETECTED Final    Comment: Performed at [facility], [address], [place], [place] [address]  Blood culture (  routine x 2)     Status: Abnormal   Collection Time: 05/08/2019  1:24 PM   Specimen: BLOOD  Result Value Ref Range Status   Specimen Description   Final    BLOOD R LATREAL BICEP Performed at [facility], [address] [place], [place] [address]    Special Requests   Final    BOTTLES DRAWN AEROBIC AND ANAEROBIC Blood Culture adequate volume Performed at [facility], [address] [place], [place] [address]    Culture  Setup Time   Final    IN BOTH AEROBIC AND ANAEROBIC BOTTLES GRAM POSITIVE COCCI CRITICAL VALUE NOTED.  VALUE IS CONSISTENT WITH PREVIOUSLY REPORTED AND CALLED VALUE. Performed at [facility], [address] [place], [place] [address]    Culture METHICILLIN RESISTANT STAPHYLOCOCCUS AUREUS (A)  Final   Report Status 05/13/2019 FINAL  Final   Organism ID, Bacteria METHICILLIN RESISTANT STAPHYLOCOCCUS AUREUS  Final      Susceptibility   Methicillin resistant staphylococcus aureus - MIC*    CIPROFLOXACIN >=8 RESISTANT Resistant     ERYTHROMYCIN >=8 RESISTANT Resistant     GENTAMICIN <=0.5 SENSITIVE Sensitive     OXACILLIN >=4 RESISTANT Resistant     TETRACYCLINE <=1 SENSITIVE Sensitive     VANCOMYCIN <=0.5 SENSITIVE Sensitive     TRIMETH/SULFA >=320 RESISTANT Resistant     CLINDAMYCIN <=0.25 SENSITIVE Sensitive     RIFAMPIN <=0.5 SENSITIVE Sensitive     Inducible Clindamycin NEGATIVE Sensitive     * METHICILLIN RESISTANT STAPHYLOCOCCUS AUREUS  Respiratory Panel by RT PCR (Flu A&B, Covid) - Nasopharyngeal Swab     Status: None   Collection Time: 05/08/2019  1:24 PM   Specimen: Nasopharyngeal Swab  Result Value Ref Range Status   SARS Coronavirus 2 by RT PCR NEGATIVE NEGATIVE Final    Comment: (NOTE) SARS-CoV-2 target nucleic acids are NOT  DETECTED. The SARS-CoV-2 RNA is generally detectable in upper respiratoy specimens during the acute phase of infection. The lowest concentration of SARS-CoV-2 viral copies this assay can detect is 131 copies/mL. A negative result does not preclude SARS-Cov-2 infection and should not be used as the sole basis for treatment or other patient management decisions. A negative result may occur with  improper specimen collection/handling, submission of specimen other than nasopharyngeal swab, presence of viral mutation(s) within the areas targeted by this assay, and inadequate number of viral copies (<131 copies/mL). A negative result must be combined with clinical observations, patient history, and epidemiological information. The expected result is Negative. Fact Sheet for Patients:  PinkCheek.be Fact Sheet for Healthcare Providers:  GravelBags.it This test is not yet ap proved or cleared by the [place] FDA and  has been authorized for detection and/or diagnosis of SARS-CoV-2 by FDA under an Emergency Use Authorization (EUA). This EUA will remain  in effect (meaning this test can be used) for the duration of the COVID-19 declaration under Section 564(b)(1) of the Act, 21 U.S.C. section 360bbb-3(b)(1), unless the authorization is terminated or revoked sooner.    Influenza A by PCR NEGATIVE NEGATIVE Final   Influenza B by PCR NEGATIVE NEGATIVE Final    Comment: (NOTE) The Xpert Xpress SARS-CoV-2/FLU/RSV assay is intended as an aid in  the diagnosis of influenza from Nasopharyngeal swab specimens and  should not be used as a sole basis for treatment. Nasal washings and  aspirates are unacceptable for Xpert Xpress SARS-CoV-2/FLU/RSV  testing. Fact Sheet for Patients: PinkCheek.be Fact Sheet for Healthcare Providers: GravelBags.it This test  is not yet approved or cleared  by the [place] and  has been authorized for detection and/or diagnosis of SARS-CoV-2 by  FDA under an Emergency Use Authorization (EUA). This EUA will remain  in effect (meaning this test can be used) for the duration of the  Covid-19 declaration under Section 564(b)(1) of the Act, 21  U.S.C. section 360bbb-3(b)(1), unless the authorization is  terminated or revoked. Performed at [facility], [address] [place], [place] [address]   MRSA PCR Screening     Status: None   Collection Time: 05/11/2019  6:27 PM   Specimen: Nasopharyngeal  Result Value Ref Range Status   MRSA by PCR NEGATIVE NEGATIVE Final    Comment:        The GeneXpert MRSA Assay (FDA approved for NASAL specimens only), is one component of a comprehensive MRSA colonization surveillance program. It is not intended to diagnose MRSA infection nor to guide or monitor treatment for MRSA infections. Performed at [facility], [address] [place], [place] [address]   Urine culture     Status: Abnormal   Collection Time: 05/11/2019 10:30 PM   Specimen: Urine, Clean Catch  Result Value Ref Range Status   Specimen Description   Final    URINE, CLEAN CATCH Performed at [facility], [address] [place], [place] [address]    Special Requests   Final    NONE Performed at [facility], [place], [place] [address]    Culture (A)  Final    >=100,000 COLONIES/mL KLEBSIELLA PNEUMONIAE >=100,000 COLONIES/mL VANCOMYCIN RESISTANT ENTEROCOCCUS    Report Status 05/14/2019 FINAL  Final   Organism ID, Bacteria KLEBSIELLA PNEUMONIAE (A)  Final   Organism ID, Bacteria VANCOMYCIN RESISTANT ENTEROCOCCUS (A)  Final      Susceptibility   Klebsiella pneumoniae - MIC*    AMPICILLIN >=32 RESISTANT Resistant     CEFAZOLIN <=4 SENSITIVE Sensitive     CEFTRIAXONE <=0.25 SENSITIVE Sensitive     CIPROFLOXACIN <=0.25 SENSITIVE Sensitive     GENTAMICIN <=1 SENSITIVE  Sensitive     IMIPENEM 0.5 SENSITIVE Sensitive     NITROFURANTOIN 64 INTERMEDIATE Intermediate     TRIMETH/SULFA <=20 SENSITIVE Sensitive     AMPICILLIN/SULBACTAM 4 SENSITIVE Sensitive     PIP/TAZO <=4 SENSITIVE Sensitive     * >=100,000 COLONIES/mL KLEBSIELLA PNEUMONIAE   Vancomycin resistant enterococcus - MIC*    AMPICILLIN >=32 RESISTANT Resistant     NITROFURANTOIN 256 RESISTANT Resistant     VANCOMYCIN >=32 RESISTANT Resistant     LINEZOLID 2 SENSITIVE Sensitive     * >=100,000 COLONIES/mL VANCOMYCIN RESISTANT ENTEROCOCCUS  Cath Tip Culture     Status: Abnormal   Collection Time: 05/11/19  1:10 PM   Specimen: Catheter Tip; Other  Result Value Ref Range Status   Specimen Description   Final    CATH TIP Performed at [facility], [address] [place], [place] [address]    Special Requests   Final    NONE Performed at [facility], [address] [place], [place] [address]    Culture (A)  Final    >=100,000 COLONIES/mL METHICILLIN RESISTANT STAPHYLOCOCCUS AUREUS   Report Status 05/13/2019 FINAL  Final   Organism ID, Bacteria METHICILLIN RESISTANT STAPHYLOCOCCUS AUREUS (A)  Final      Susceptibility   Methicillin resistant staphylococcus aureus - MIC*    CIPROFLOXACIN >=8 RESISTANT Resistant     ERYTHROMYCIN >=8 RESISTANT Resistant  GENTAMICIN <=0.5 SENSITIVE Sensitive     OXACILLIN >=4 RESISTANT Resistant     TETRACYCLINE <=1 SENSITIVE Sensitive     VANCOMYCIN 1 SENSITIVE Sensitive     TRIMETH/SULFA >=320 RESISTANT Resistant     CLINDAMYCIN <=0.25 SENSITIVE Sensitive     RIFAMPIN <=0.5 SENSITIVE Sensitive     Inducible Clindamycin NEGATIVE Sensitive     * >=100,000 COLONIES/mL METHICILLIN RESISTANT STAPHYLOCOCCUS AUREUS  Culture, blood (routine x 2)     Status: Abnormal   Collection Time: 05/11/19  4:27 PM   Specimen: BLOOD  Result Value Ref Range Status   Specimen Description   Final    BLOOD A-LINE Performed at [facility],  [address], [place], [place] [address]    Special Requests   Final    BOTTLES DRAWN AEROBIC AND ANAEROBIC Blood Culture adequate volume Performed at [facility], [address] [place], [place] [address]    Culture  Setup Time   Final    GRAM POSITIVE COCCI ANAEROBIC BOTTLE ONLY CRITICAL VALUE NOTED.  VALUE IS CONSISTENT WITH PREVIOUSLY REPORTED AND CALLED VALUE. Performed at [facility], [address] [place], [place] [address]    Culture (A)  Final    STAPHYLOCOCCUS AUREUS SUSCEPTIBILITIES PERFORMED ON PREVIOUS CULTURE WITHIN THE LAST 5 DAYS. Performed at [facility], [place] [address], [place], [place] [address]    Report Status 05/31/2019 FINAL  Final  Culture, blood (routine x 2)     Status: Abnormal   Collection Time: 05/12/19  6:06 AM   Specimen: BLOOD  Result Value Ref Range Status   Specimen Description   Final    BLOOD BLOOD RIGHT HAND Performed at [facility], [address] [place], [place] [address]    Special Requests   Final    BOTTLES DRAWN AEROBIC AND ANAEROBIC Blood Culture adequate volume Performed at [facility], [address] [place], [place] [address]    Culture  Setup Time   Final    GRAM POSITIVE COCCI ANAEROBIC BOTTLE ONLY CRITICAL RESULT CALLED TO, READ BACK BY AND VERIFIED WITH: DAVID BESANTI AT 10PM ON 05/12/2019 BY MOSLEY,J Performed at [facility], [address] [place], [place] [address]    Culture METHICILLIN RESISTANT STAPHYLOCOCCUS AUREUS (A)  Final   Report Status 06/02/2019 FINAL  Final   Organism ID, Bacteria METHICILLIN RESISTANT STAPHYLOCOCCUS AUREUS  Final      Susceptibility   Methicillin resistant staphylococcus aureus - MIC*    CIPROFLOXACIN >=8 RESISTANT Resistant     ERYTHROMYCIN >=8 RESISTANT Resistant     GENTAMICIN <=0.5 SENSITIVE Sensitive     OXACILLIN >=4 RESISTANT Resistant     TETRACYCLINE <=1 SENSITIVE Sensitive     VANCOMYCIN 1 SENSITIVE Sensitive      TRIMETH/SULFA >=320 RESISTANT Resistant     CLINDAMYCIN <=0.25 SENSITIVE Sensitive     RIFAMPIN <=0.5 SENSITIVE Sensitive     Inducible Clindamycin NEGATIVE Sensitive     * METHICILLIN RESISTANT STAPHYLOCOCCUS AUREUS  CULTURE, BLOOD (ROUTINE X 2) w Reflex to ID Panel     Status: Abnormal   Collection Time: 05/13/19  2:20 PM   Specimen: BLOOD  Result Value Ref Range Status   Specimen Description   Final    BLOOD BLOOD RIGHT HAND Performed at [facility], [address] [place],  [address]    Special Requests   Final    BOTTLES DRAWN AEROBIC AND ANAEROBIC Blood Culture adequate volume Performed at [facility], [address], [place], [place] [address]    Culture  Setup Time   Final    GRAM POSITIVE COCCI ANAEROBIC BOTTLE ONLY CRITICAL RESULT CALLED TO, READ BACK BY AND VERIFIED WITH: Curt Jews AT 9485 05/14/19 SDR Performed at [facility], [address] [place], [place] [address]    Culture (A)  Final    STAPHYLOCOCCUS AUREUS SUSCEPTIBILITIES PERFORMED ON PREVIOUS CULTURE WITHIN THE LAST 5 DAYS. Performed at [facility], [place] [address], [place], [place] [address]    Report Status 05/16/2019 FINAL  Final  CULTURE, BLOOD (ROUTINE X 2) w Reflex to ID Panel     Status: None   Collection Time: 05/13/19  3:39 PM   Specimen: BLOOD  Result Value Ref Range Status   Specimen Description BLOOD BLOOD LEFT HAND  Final   Special Requests   Final    BOTTLES DRAWN AEROBIC ONLY Blood Culture adequate volume   Culture   Final    NO GROWTH 5 DAYS Performed at [facility], [address] [place], [place] [address]    Report Status 05/18/2019 FINAL  Final  CULTURE, BLOOD (ROUTINE X 2) w Reflex to ID Panel     Status: Abnormal   Collection Time: 05/14/19  3:33 PM   Specimen: BLOOD  Result Value Ref Range Status   Specimen Description   Final    BLOOD BLOOD RIGHT HAND Performed at [facility], [address], [place], [place] [address]    Special Requests   Final    BOTTLES DRAWN AEROBIC AND ANAEROBIC Blood Culture adequate volume Performed at [facility], [address] [place], [place] [address]    Culture  Setup Time   Final    GRAM POSITIVE COCCI IN CLUSTERS ANAEROBIC BOTTLE ONLY CRITICAL VALUE NOTED.  VALUE IS CONSISTENT WITH PREVIOUSLY REPORTED AND CALLED VALUE.    Culture (A)  Final    STAPHYLOCOCCUS AUREUS SUSCEPTIBILITIES PERFORMED ON PREVIOUS CULTURE WITHIN THE LAST 5 DAYS. Performed at [facility], [place] [address] [place], [place] [address]    Report Status 05/18/2019 FINAL  Final  Cath Tip Culture     Status: None   Collection Time: 05/14/19  4:24 PM   Specimen: Catheter Tip; Other  Result Value Ref Range Status   Specimen Description   Final    CATH TIP Performed at [facility], [address], [place], [place] [address]    Special Requests   Final    NONE Performed at [facility], [address], [place], [place] [address]    Culture   Final    NO GROWTH 3 DAYS Performed at [facility], [place] [address] [place], [place] [address]    Report Status 05/17/2019 FINAL  Final  CULTURE, BLOOD (ROUTINE X 2) w Reflex to ID Panel     Status: Abnormal   Collection Time: 05/14/19  6:25 PM   Specimen: BLOOD  Result Value Ref Range Status   Specimen Description   Final    BLOOD BLOOD LEFT HAND Performed at [facility], [address] [place], [place] [address]    Special Requests   Final    BOTTLES DRAWN AEROBIC AND ANAEROBIC Blood Culture results may not be optimal due to an inadequate volume of blood received in culture bottles Performed at [facility], [address], [place],  [address]    Culture  Setup Time   Final  GRAM POSITIVE COCCI ANAEROBIC BOTTLE ONLY CRITICAL VALUE NOTED.  VALUE IS CONSISTENT WITH PREVIOUSLY REPORTED AND CALLED VALUE. [place] Performed at [facility],  [address] [place], [place] [address]    Culture (A)  Final    STAPHYLOCOCCUS AUREUS SUSCEPTIBILITIES PERFORMED ON PREVIOUS CULTURE WITHIN THE LAST 5 DAYS. Performed at [facility], [place] [address], [place], [place] [address]    Report Status 05/17/2019 FINAL  Final  Culture, blood (Routine X 2) w Reflex to ID Panel     Status: None (Preliminary result)   Collection Time: 05/16/19 10:45 AM   Specimen: BLOOD  Result Value Ref Range Status   Specimen Description BLOOD RIGHT HAND  Final   Special Requests BLOOD Blood Culture adequate volume  Final   Culture   Final    NO GROWTH 2 DAYS Performed at [facility], [address], [place], [place] [address]    Report Status PENDING  Incomplete  Culture, blood (Routine X 2) w Reflex to ID Panel     Status: None (Preliminary result)   Collection Time: 05/16/19 12:38 PM   Specimen: BLOOD  Result Value Ref Range Status   Specimen Description BLOOD RT HAND  Final   Special Requests   Final    BOTTLES DRAWN AEROBIC AND ANAEROBIC Blood Culture adequate volume   Culture   Final    NO GROWTH 2 DAYS Performed at [facility], [address], [place], [place] [address]    Report Status PENDING  Incomplete    Coagulation Studies: No results for input(s): LABPROT, INR in the last 72 hours.  Urinalysis: No results for input(s): COLORURINE, LABSPEC, PHURINE, GLUCOSEU, HGBUR, BILIRUBINUR, KETONESUR, PROTEINUR, UROBILINOGEN, NITRITE, LEUKOCYTESUR in the last 72 hours.  Invalid input(s): APPERANCEUR    Imaging: No results found.   Medications:   . DAPTOmycin (CUBICIN)  IV Stopped (05/16/19 2102)  . levETIRAcetam Stopped (05/17/19 1644)   . amLODipine  10 mg Oral Daily  . atorvastatin  10 mg Oral q1800  . carvedilol  25 mg Oral BID WC  . Chlorhexidine Gluconate Cloth  6 each Topical Daily  . feeding supplement (ENSURE ENLIVE)  237 mL Oral TID  . insulin aspart  0-9 Units Subcutaneous Q4H  . midodrine  5  mg Oral Q M,W,F  . pantoprazole (PROTONIX) IV  40 mg Intravenous QHS  . vancomycin variable dose per unstable renal function (pharmacist dosing)   Does not apply See admin instructions     so far Assessment/ Plan:  Ms. Panda Crossin is a 74 y.o. black female Ms. Nicol Herbig is a 74 y.o. black female with end stage renal disease on hemodialysis, CVA with left hemiparesis, hypertension, hyperlipidemia, GERD, glaucoma, diabetes mellitus type II, coronary artery disease, gout, seizure disorder who was admitted to [facility] on 05/31/2019 for Hypokalemia [E87.6] Shock ([place]) [R57.9] ESRD (end stage renal disease) ([place]) [N18.6] Hypotension, unspecified hypotension type [I95.9] Anemia, unspecified type [D64.9]  [facility] MWF  63.5kg  # End Stage Renal Disease: last hemodialysis treatment was Friday 4/2. No acute indication for dialysis.  Appreciate vascular input - temp HD catheter removed  - Plan on permcath placement when blood culture are neg. Tentatively on monday    # Hypotension: with MRSA bacteremia/sepsis weaned off vasopressors: Persistent positive blood cultures from 4/7.   Neg results from 4/9 so far - Continue vancomycin and daptomycin. As per ID recommendations    #. Anemia with chronic kidney disease: with iron deficiency  and folate deficiency. Subcu EPO on 4/7.  Lab Results  Component Value Date   HGB 7.1 (L) 05/18/2019    # Secondary Hyperparathyroidism:  Off binders at present Lab Results  Component Value Date   PTH 232 (H) 05/11/2019   CALCIUM 8.8 (L) 05/18/2019   PHOS 1.3 (L) 05/16/2019    #Malnutrition:   Currently on pureed diet  albumin 2.0 on 05/18/2019     LOS: 8 Josseline Reddin 4/11/20212:04 PM

## 2019-05-18 NOTE — Progress Notes (Signed)
PROGRESS NOTE    Crystal Vargas  OZH:[id number] DOB: Mar 15, 1945 DOA: 06/02/2019 PCP: Marisa Hua, MD       Assessment & Plan:   Principal Problem:   MRSA bacteremia Active Problems:   Diabetes ([place])   Essential hypertension   ESRD on dialysis ([place])   Shock ([place])   Status post CVA   Schatzki's ring   MRSA bacteremia: secondary infected HD catheter which has been removed w/ MRSA bacteremia. S/p placement of temporary HD catheter. Continue on IV daptomycin. Blood cx 05/14/19 are still growing MRSA. Repeat blood cx 05/16/19 NGTD.  Septic shock: secondary to above. Continue on IV abxs. Resolved  ESRD: on HD. Temp catheter removed secondary to recurrent MRSA bacteremia. Will place permcath tomorrow as per vasc surg. HD as per nephro  Thrombocytopenia: etiology unclear, possibly secondary to infection stated above. 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 No need for a transfusion at this time. Will continue to monitor   Severe protein calorie malnutrition: w/ poor po intake due to chronic issues w/ dysphagia. Nutrition consulted   Weakness: w/ left hemiparesis. PT recs SNF. OT consulted   DM2: will continue on SSI w/ accuchecks  HLD: will continue on statin   HTN: will continue carvedilol, amlodipine   GERD: continue on PPI   CAD: will continue on carvedilol, amlodipine & statin.    DVT prophylaxis: SCDs Code Status:  Full  Family Communication: discussed pt's care w/ pt's daughter, [name], and answered her questions  Disposition Plan: PT recs SNF, will likely d/c to SNF    Consultants:   ID  Vascular surgery  ICU  nephro   Procedures:    Antimicrobials: daptomycin    Subjective: Pt c/o fatigue  Objective: Vitals:   05/17/19 2027 05/18/19 0105 05/18/19 0434 05/18/19 0451  BP: (!) 130/50 124/79 (!) 139/52   Pulse: 66 66 63   Resp: 16 (!) 21 20   Temp: 99.4 F (37.4 C) 98.5 F (36.9 C) 98.3 F (36.8 C)   TempSrc: Oral Oral Oral    SpO2: 93% 94% 93%   Weight:    72 kg  Height:        Intake/Output Summary (Last 24 hours) at 05/18/2019 0750 Last data filed at 05/17/2019 1700 Gross per 24 hour  Intake 490 ml  Output 0 ml  Net 490 ml   Filed Weights   05/16/19 0500 05/17/19 0500 05/18/19 0451  Weight: 68.2 kg 80.1 kg 72 kg    Examination:  General exam: Appears calm and comfortable. Frail appearing.  Respiratory system: diminished breath sounds b/l . Cardiovascular system: S1 & S2 +. No rubs, gallops or clicks.  Gastrointestinal system: Abdomen is nondistended, soft and nontender. normal bowel sounds heard. Central nervous system: Lethargic. Moves all 4 extremities  Psychiatry: Judgement and insight appear abnormal. Flat mood and affect    Data Reviewed: I have personally reviewed following labs and imaging studies  CBC: Recent Labs  Lab 05/13/19 0556 05/14/19 1051 05/12/2019 0757 05/16/19 0327 05/17/19 0423  WBC 5.1 6.5 7.7 9.5 9.2  HGB 7.8* 8.3* 8.9* 8.3* 7.8*  HCT 23.6* 22.2* 25.5* 23.7* 23.1*  MCV 96.3 86.4 93.4 90.5 94.3  PLT 38* 59* 51* 70* 48*   Basic Metabolic Panel: Recent Labs  Lab 05/12/19 0502 05/12/19 0502 05/13/19 0556 05/14/19 1051 05/08/2019 0633 05/16/19 0327 05/17/19 0423  NA 135   < > 138 143 141 142 142  K 4.2   < > 4.1 4.3  3.7 3.5 3.5  CL 103   < > 105 112* 110 108 110  CO2 21*   < > 16* 16* 19* 26 24  GLUCOSE 157*   < > 140* 123* 117* 269* 96  BUN 30*   < > 38* 44* 48* 24* 34*  CREATININE 2.47*   < > 3.01* 3.54* 3.76* 2.16* 2.66*  CALCIUM 8.5*   < > 9.1 9.4 9.3 8.8* 8.7*  MG 2.0  --   --   --   --   --   --   PHOS 3.6  --   --  3.1  --  1.3*  --    < > = values in this interval not displayed.   GFR: Estimated Creatinine Clearance: 16.7 mL/min (A) (by C-G formula based on SCr of 2.66 mg/dL (H)). Liver Function Tests: Recent Labs  Lab 05/14/19 1051 05/16/19 0327 05/17/19 0423  AST  --   --  22  ALT  --   --  19  ALKPHOS  --   --  78  BILITOT  --   --   1.7*  PROT  --   --  4.9*  ALBUMIN 2.7* 2.3* 2.1*   No results for input(s): LIPASE, AMYLASE in the last 168 hours. No results for input(s): AMMONIA in the last 168 hours. Coagulation Profile: No results for input(s): INR, PROTIME in the last 168 hours. Cardiac Enzymes: Recent Labs  Lab 05/14/2019 0633  CKTOTAL 10*   BNP (last 3 results) No results for input(s): PROBNP in the last 8760 hours. HbA1C: No results for input(s): HGBA1C in the last 72 hours. CBG: Recent Labs  Lab 05/17/19 1132 05/17/19 1621 05/17/19 2035 05/18/19 0024 05/18/19 0431  GLUCAP 162* 183* 277* 219* 114*   Lipid Profile: No results for input(s): CHOL, HDL, LDLCALC, TRIG, CHOLHDL, LDLDIRECT in the last 72 hours. Thyroid Function Tests: No results for input(s): TSH, T4TOTAL, FREET4, T3FREE, THYROIDAB in the last 72 hours. Anemia Panel: No results for input(s): VITAMINB12, FOLATE, FERRITIN, TIBC, IRON, RETICCTPCT in the last 72 hours. Sepsis Labs: Recent Labs  Lab 05/12/19 0502  PROCALCITON 45.92    Recent Results (from the past 240 hour(s))  Blood culture (routine x 2)     Status: Abnormal   Collection Time: 05/10/2019 12:34 PM   Specimen: BLOOD  Result Value Ref Range Status   Specimen Description   Final    BLOOD R UP ARM Performed at [facility], [address], [place], [place] [address]    Special Requests   Final    BOTTLES DRAWN AEROBIC AND ANAEROBIC Blood Culture adequate volume Performed at [facility], [address] [place], [place] [address]    Culture  Setup Time   Final    Organism ID to follow IN BOTH AEROBIC AND ANAEROBIC BOTTLES GRAM POSITIVE COCCI CRITICAL RESULT CALLED TO, READ BACK BY AND VERIFIED WITH: SCOTT HALL ON 05/11/19 AT 0210 [facility] Performed at [facility], [address] [place], [place] [address]    Culture (A)  Final    STAPHYLOCOCCUS AUREUS SUSCEPTIBILITIES PERFORMED ON PREVIOUS CULTURE WITHIN THE LAST 5 DAYS. Performed at  [facility], [place] [address] [place], [place] [address]    Report Status 05/13/2019 FINAL  Final  Blood Culture ID Panel (Reflexed)     Status: Abnormal   Collection Time: 06/05/2019 12:34 PM  Result Value Ref Range Status   Enterococcus species NOT DETECTED NOT DETECTED Final  Listeria monocytogenes NOT DETECTED NOT DETECTED Final   Staphylococcus species DETECTED (A) NOT DETECTED Final    Comment: CRITICAL RESULT CALLED TO, READ BACK BY AND VERIFIED WITH: SCOTT HALL ON 05/11/19 AT 0210 [facility]    Staphylococcus aureus (BCID) DETECTED (A) NOT DETECTED Final    Comment: Methicillin (oxacillin)-resistant Staphylococcus aureus (MRSA). MRSA is predictably resistant to beta-lactam antibiotics (except ceftaroline). Preferred therapy is vancomycin unless clinically contraindicated. Patient requires contact precautions if  hospitalized. CRITICAL RESULT CALLED TO, READ BACK BY AND VERIFIED WITH: SCOTT HALL ON 05/11/19 AT 0210 [facility]    Methicillin resistance DETECTED (A) NOT DETECTED Final    Comment: CRITICAL RESULT CALLED TO, READ BACK BY AND VERIFIED WITH: SCOTT HALL ON 05/11/19 AT 0210 [place]    Streptococcus species NOT DETECTED NOT DETECTED Final   Streptococcus agalactiae NOT DETECTED NOT DETECTED Final   Streptococcus pneumoniae NOT DETECTED NOT DETECTED Final   Streptococcus pyogenes NOT DETECTED NOT DETECTED Final   Acinetobacter baumannii NOT DETECTED NOT DETECTED Final   Enterobacteriaceae species NOT DETECTED NOT DETECTED Final   Enterobacter cloacae complex NOT DETECTED NOT DETECTED Final   Escherichia coli NOT DETECTED NOT DETECTED Final   Klebsiella oxytoca NOT DETECTED NOT DETECTED Final   Klebsiella pneumoniae NOT DETECTED NOT DETECTED Final   Proteus species NOT DETECTED NOT DETECTED Final   Serratia marcescens NOT DETECTED NOT DETECTED Final   Haemophilus influenzae NOT DETECTED NOT DETECTED Final   Neisseria meningitidis NOT DETECTED NOT DETECTED Final   Pseudomonas aeruginosa  NOT DETECTED NOT DETECTED Final   Candida albicans NOT DETECTED NOT DETECTED Final   Candida glabrata NOT DETECTED NOT DETECTED Final   Candida krusei NOT DETECTED NOT DETECTED Final   Candida parapsilosis NOT DETECTED NOT DETECTED Final   Candida tropicalis NOT DETECTED NOT DETECTED Final    Comment: Performed at [facility], [address] [place], [place] [address]  Blood culture (routine x 2)     Status: Abnormal   Collection Time: 05/24/2019  1:24 PM   Specimen: BLOOD  Result Value Ref Range Status   Specimen Description   Final    BLOOD R LATREAL BICEP Performed at [facility], [address], [place], [place] [address]    Special Requests   Final    BOTTLES DRAWN AEROBIC AND ANAEROBIC Blood Culture adequate volume Performed at [facility], [address] [place], [place] [address]    Culture  Setup Time   Final    IN BOTH AEROBIC AND ANAEROBIC BOTTLES GRAM POSITIVE COCCI CRITICAL VALUE NOTED.  VALUE IS CONSISTENT WITH PREVIOUSLY REPORTED AND CALLED VALUE. Performed at [facility], [address] [place], [place] [address]    Culture METHICILLIN RESISTANT STAPHYLOCOCCUS AUREUS (A)  Final   Report Status 05/13/2019 FINAL  Final   Organism ID, Bacteria METHICILLIN RESISTANT STAPHYLOCOCCUS AUREUS  Final      Susceptibility   Methicillin resistant staphylococcus aureus - MIC*    CIPROFLOXACIN >=8 RESISTANT Resistant     ERYTHROMYCIN >=8 RESISTANT Resistant     GENTAMICIN <=0.5 SENSITIVE Sensitive     OXACILLIN >=4 RESISTANT Resistant     TETRACYCLINE <=1 SENSITIVE Sensitive     VANCOMYCIN <=0.5 SENSITIVE Sensitive     TRIMETH/SULFA >=320 RESISTANT Resistant     CLINDAMYCIN <=0.25 SENSITIVE Sensitive     RIFAMPIN <=0.5 SENSITIVE Sensitive     Inducible Clindamycin NEGATIVE Sensitive     * METHICILLIN RESISTANT STAPHYLOCOCCUS AUREUS  Respiratory Panel by RT PCR (  Flu A&B, Covid) - Nasopharyngeal Swab     Status: None   Collection  Time: 05/28/2019  1:24 PM   Specimen: Nasopharyngeal Swab  Result Value Ref Range Status   SARS Coronavirus 2 by RT PCR NEGATIVE NEGATIVE Final    Comment: (NOTE) SARS-CoV-2 target nucleic acids are NOT DETECTED. The SARS-CoV-2 RNA is generally detectable in upper respiratoy specimens during the acute phase of infection. The lowest concentration of SARS-CoV-2 viral copies this assay can detect is 131 copies/mL. A negative result does not preclude SARS-Cov-2 infection and should not be used as the sole basis for treatment or other patient management decisions. A negative result may occur with  improper specimen collection/handling, submission of specimen other than nasopharyngeal swab, presence of viral mutation(s) within the areas targeted by this assay, and inadequate number of viral copies (<131 copies/mL). A negative result must be combined with clinical observations, patient history, and epidemiological information. The expected result is Negative. Fact Sheet for Patients:  PinkCheek.be Fact Sheet for Healthcare Providers:  GravelBags.it This test is not yet ap proved or cleared by the [place] FDA and  has been authorized for detection and/or diagnosis of SARS-CoV-2 by FDA under an Emergency Use Authorization (EUA). This EUA will remain  in effect (meaning this test can be used) for the duration of the COVID-19 declaration under Section 564(b)(1) of the Act, 21 U.S.C. section 360bbb-3(b)(1), unless the authorization is terminated or revoked sooner.    Influenza A by PCR NEGATIVE NEGATIVE Final   Influenza B by PCR NEGATIVE NEGATIVE Final    Comment: (NOTE) The Xpert Xpress SARS-CoV-2/FLU/RSV assay is intended as an aid in  the diagnosis of influenza from Nasopharyngeal swab specimens and  should not be used as a sole basis for treatment. Nasal washings and  aspirates are unacceptable for Xpert Xpress  SARS-CoV-2/FLU/RSV  testing. Fact Sheet for Patients: PinkCheek.be Fact Sheet for Healthcare Providers: GravelBags.it This test is not yet approved or cleared by the [place] FDA and  has been authorized for detection and/or diagnosis of SARS-CoV-2 by  FDA under an Emergency Use Authorization (EUA). This EUA will remain  in effect (meaning this test can be used) for the duration of the  Covid-19 declaration under Section 564(b)(1) of the Act, 21  U.S.C. section 360bbb-3(b)(1), unless the authorization is  terminated or revoked. Performed at [facility], [address] [place], [place] [address]   MRSA PCR Screening     Status: None   Collection Time: 05/16/2019  6:27 PM   Specimen: Nasopharyngeal  Result Value Ref Range Status   MRSA by PCR NEGATIVE NEGATIVE Final    Comment:        The GeneXpert MRSA Assay (FDA approved for NASAL specimens only), is one component of a comprehensive MRSA colonization surveillance program. It is not intended to diagnose MRSA infection nor to guide or monitor treatment for MRSA infections. Performed at [facility], [address], [place], [place] [address]   Urine culture     Status: Abnormal   Collection Time: 06/04/2019 10:30 PM   Specimen: Urine, Clean Catch  Result Value Ref Range Status   Specimen Description   Final    URINE, CLEAN CATCH Performed at [facility], [address], [place], [place] [address]    Special Requests   Final    NONE Performed at [facility], [address] [place], [place] [address]    Culture (A)  Final    >=100,000  COLONIES/mL KLEBSIELLA PNEUMONIAE >=100,000 COLONIES/mL VANCOMYCIN RESISTANT ENTEROCOCCUS    Report Status 05/14/2019 FINAL  Final   Organism ID, Bacteria KLEBSIELLA PNEUMONIAE (A)  Final   Organism ID, Bacteria VANCOMYCIN RESISTANT ENTEROCOCCUS (A)  Final      Susceptibility    Klebsiella pneumoniae - MIC*    AMPICILLIN >=32 RESISTANT Resistant     CEFAZOLIN <=4 SENSITIVE Sensitive     CEFTRIAXONE <=0.25 SENSITIVE Sensitive     CIPROFLOXACIN <=0.25 SENSITIVE Sensitive     GENTAMICIN <=1 SENSITIVE Sensitive     IMIPENEM 0.5 SENSITIVE Sensitive     NITROFURANTOIN 64 INTERMEDIATE Intermediate     TRIMETH/SULFA <=20 SENSITIVE Sensitive     AMPICILLIN/SULBACTAM 4 SENSITIVE Sensitive     PIP/TAZO <=4 SENSITIVE Sensitive     * >=100,000 COLONIES/mL KLEBSIELLA PNEUMONIAE   Vancomycin resistant enterococcus - MIC*    AMPICILLIN >=32 RESISTANT Resistant     NITROFURANTOIN 256 RESISTANT Resistant     VANCOMYCIN >=32 RESISTANT Resistant     LINEZOLID 2 SENSITIVE Sensitive     * >=100,000 COLONIES/mL VANCOMYCIN RESISTANT ENTEROCOCCUS  Cath Tip Culture     Status: Abnormal   Collection Time: 05/11/19  1:10 PM   Specimen: Catheter Tip; Other  Result Value Ref Range Status   Specimen Description   Final    CATH TIP Performed at [facility], [address] [place], [place] [address]    Special Requests   Final    NONE Performed at [facility], [address] [place], [place] [address]    Culture (A)  Final    >=100,000 COLONIES/mL METHICILLIN RESISTANT STAPHYLOCOCCUS AUREUS   Report Status 05/13/2019 FINAL  Final   Organism ID, Bacteria METHICILLIN RESISTANT STAPHYLOCOCCUS AUREUS (A)  Final      Susceptibility   Methicillin resistant staphylococcus aureus - MIC*    CIPROFLOXACIN >=8 RESISTANT Resistant     ERYTHROMYCIN >=8 RESISTANT Resistant     GENTAMICIN <=0.5 SENSITIVE Sensitive     OXACILLIN >=4 RESISTANT Resistant     TETRACYCLINE <=1 SENSITIVE Sensitive     VANCOMYCIN 1 SENSITIVE Sensitive     TRIMETH/SULFA >=320 RESISTANT Resistant     CLINDAMYCIN <=0.25 SENSITIVE Sensitive     RIFAMPIN <=0.5 SENSITIVE Sensitive     Inducible Clindamycin NEGATIVE Sensitive     * >=100,000 COLONIES/mL METHICILLIN RESISTANT STAPHYLOCOCCUS AUREUS   Culture, blood (routine x 2)     Status: Abnormal   Collection Time: 05/11/19  4:27 PM   Specimen: BLOOD  Result Value Ref Range Status   Specimen Description   Final    BLOOD A-LINE Performed at [facility], [address], [place], [place] [address]    Special Requests   Final    BOTTLES DRAWN AEROBIC AND ANAEROBIC Blood Culture adequate volume Performed at [facility], [address] [place], [place] [address]    Culture  Setup Time   Final    GRAM POSITIVE COCCI ANAEROBIC BOTTLE ONLY CRITICAL VALUE NOTED.  VALUE IS CONSISTENT WITH PREVIOUSLY REPORTED AND CALLED VALUE. Performed at [facility], [address] [place], [place] [address]    Culture (A)  Final    STAPHYLOCOCCUS AUREUS SUSCEPTIBILITIES PERFORMED ON PREVIOUS CULTURE WITHIN THE LAST 5 DAYS. Performed at [facility], [place] [address], [place], [place] [address]    Report Status 05/08/2019 FINAL  Final  Culture, blood (routine x 2)     Status: Abnormal   Collection Time: 05/12/19  6:06 AM  Specimen: BLOOD  Result Value Ref Range Status   Specimen Description   Final    BLOOD BLOOD RIGHT HAND Performed at [facility], [address] [place], [place] [address]    Special Requests   Final    BOTTLES DRAWN AEROBIC AND ANAEROBIC Blood Culture adequate volume Performed at [facility], [address] [place], [place] [address]    Culture  Setup Time   Final    GRAM POSITIVE COCCI ANAEROBIC BOTTLE ONLY CRITICAL RESULT CALLED TO, READ BACK BY AND VERIFIED WITH: [place] ON 05/12/2019 BY MOSLEY,J Performed at [facility], [address] [place], [place] [address]    Culture METHICILLIN RESISTANT STAPHYLOCOCCUS AUREUS (A)  Final   Report Status 05/08/2019 FINAL  Final   Organism ID, Bacteria METHICILLIN RESISTANT STAPHYLOCOCCUS AUREUS  Final      Susceptibility   Methicillin resistant staphylococcus aureus - MIC*    CIPROFLOXACIN  >=8 RESISTANT Resistant     ERYTHROMYCIN >=8 RESISTANT Resistant     GENTAMICIN <=0.5 SENSITIVE Sensitive     OXACILLIN >=4 RESISTANT Resistant     TETRACYCLINE <=1 SENSITIVE Sensitive     VANCOMYCIN 1 SENSITIVE Sensitive     TRIMETH/SULFA >=320 RESISTANT Resistant     CLINDAMYCIN <=0.25 SENSITIVE Sensitive     RIFAMPIN <=0.5 SENSITIVE Sensitive     Inducible Clindamycin NEGATIVE Sensitive     * METHICILLIN RESISTANT STAPHYLOCOCCUS AUREUS  CULTURE, BLOOD (ROUTINE X 2) w Reflex to ID Panel     Status: Abnormal   Collection Time: 05/13/19  2:20 PM   Specimen: BLOOD  Result Value Ref Range Status   Specimen Description   Final    BLOOD BLOOD RIGHT HAND Performed at [facility], [address] [place], [place] [address]    Special Requests   Final    BOTTLES DRAWN AEROBIC AND ANAEROBIC Blood Culture adequate volume Performed at [facility], [address] [place], [place] [address]    Culture  Setup Time   Final    GRAM POSITIVE COCCI ANAEROBIC BOTTLE ONLY CRITICAL RESULT CALLED TO, READ BACK BY AND VERIFIED WITH: [facility] KATSOUDAS AT 7741 05/14/19 SDR Performed at [facility], [address] [place], [place] [address]    Culture (A)  Final    STAPHYLOCOCCUS AUREUS SUSCEPTIBILITIES PERFORMED ON PREVIOUS CULTURE WITHIN THE LAST 5 DAYS. Performed at [facility], [place] [address], [place], [place] [address]    Report Status 05/16/2019 FINAL  Final  CULTURE, BLOOD (ROUTINE X 2) w Reflex to ID Panel     Status: None   Collection Time: 05/13/19  3:39 PM   Specimen: BLOOD  Result Value Ref Range Status   Specimen Description BLOOD BLOOD LEFT HAND  Final   Special Requests   Final    BOTTLES DRAWN AEROBIC ONLY Blood Culture adequate volume   Culture   Final    NO GROWTH 5 DAYS Performed at [facility], [address] [place], [place] [address]    Report Status 05/18/2019 FINAL  Final  CULTURE, BLOOD (ROUTINE X 2) w Reflex to ID  Panel     Status: Abnormal (Preliminary result)   Collection Time: 05/14/19  3:33 PM   Specimen: BLOOD  Result Value Ref Range Status   Specimen Description   Final    BLOOD BLOOD RIGHT HAND Performed at [facility], [address], [place], [place] [address]    Special Requests  Final    BOTTLES DRAWN AEROBIC AND ANAEROBIC Blood Culture adequate volume Performed at [facility], [address] [place], [place] [address]    Culture  Setup Time   Final    GRAM POSITIVE COCCI IN CLUSTERS ANAEROBIC BOTTLE ONLY CRITICAL VALUE NOTED.  VALUE IS CONSISTENT WITH PREVIOUSLY REPORTED AND CALLED VALUE.    Culture (A)  Final    STAPHYLOCOCCUS AUREUS SUSCEPTIBILITIES PERFORMED ON PREVIOUS CULTURE WITHIN THE LAST 5 DAYS. Performed at [facility], [place] [address], [place], [place] [address]    Report Status PENDING  Incomplete  Cath Tip Culture     Status: None   Collection Time: 05/14/19  4:24 PM   Specimen: Catheter Tip; Other  Result Value Ref Range Status   Specimen Description   Final    CATH TIP Performed at [facility], [address], [place], [place] [address]    Special Requests   Final    NONE Performed at [facility], [address], [place], [place] [address]    Culture   Final    NO GROWTH 3 DAYS Performed at [facility], [place] [address], [place], [place] [address]    Report Status 05/17/2019 FINAL  Final  CULTURE, BLOOD (ROUTINE X 2) w Reflex to ID Panel     Status: Abnormal   Collection Time: 05/14/19  6:25 PM   Specimen: BLOOD  Result Value Ref Range Status   Specimen Description   Final    BLOOD BLOOD LEFT HAND Performed at [facility], [address] [place], [place] [address]    Special Requests   Final    BOTTLES DRAWN AEROBIC AND ANAEROBIC Blood Culture results may not be optimal due to an inadequate volume of blood received in culture bottles Performed at [facility], [address], [place], [place] [address]    Culture  Setup Time   Final    GRAM POSITIVE COCCI ANAEROBIC BOTTLE ONLY CRITICAL VALUE NOTED.  VALUE IS CONSISTENT WITH PREVIOUSLY REPORTED AND CALLED VALUE. [place] Performed at [facility], [address] [place], [place] [address]    Culture (A)  Final    STAPHYLOCOCCUS AUREUS SUSCEPTIBILITIES PERFORMED ON PREVIOUS CULTURE WITHIN THE LAST 5 DAYS. Performed at [facility], [place] [address] [place], [place] [address]    Report Status 05/17/2019 FINAL  Final  Culture, blood (Routine X 2) w Reflex to ID Panel     Status: None (Preliminary result)   Collection Time: 05/16/19 10:45 AM   Specimen: BLOOD  Result Value Ref Range Status   Specimen Description BLOOD RIGHT HAND  Final   Special Requests BLOOD Blood Culture adequate volume  Final   Culture   Final    NO GROWTH 2 DAYS Performed at [facility], [address] [place], [place] [address]    Report Status PENDING  Incomplete  Culture, blood (Routine X 2) w Reflex to ID Panel     Status: None (Preliminary result)   Collection Time: 05/16/19 12:38 PM   Specimen: BLOOD  Result Value Ref Range Status   Specimen Description BLOOD RT HAND  Final   Special Requests   Final    BOTTLES DRAWN AEROBIC AND ANAEROBIC Blood Culture adequate volume   Culture   Final    NO GROWTH 2 DAYS Performed at [facility], [address], [place], [place] [address]    Report Status PENDING  Incomplete  Radiology Studies: No results found.      Scheduled Meds: . amLODipine  10 mg Oral Daily  . atorvastatin  10 mg Oral q1800  . carvedilol  25 mg Oral BID WC  . Chlorhexidine Gluconate Cloth  6 each Topical Daily  . feeding supplement (ENSURE ENLIVE)  237 mL Oral TID  . insulin aspart  0-9 Units Subcutaneous Q4H  . midodrine  5 mg Oral Q M,W,F  . pantoprazole (PROTONIX) IV  40 mg Intravenous QHS  . vancomycin variable dose per unstable renal function  (pharmacist dosing)   Does not apply See admin instructions   Continuous Infusions: . DAPTOmycin (CUBICIN)  IV Stopped (05/16/19 2102)  . levETIRAcetam Stopped (05/17/19 1644)     LOS: 8 days    Time spent: 30 mins     Wyvonnia Dusky, MD [facility] Pager [phone]  If 7PM-7AM, please contact night-coverage [email or web address] 05/18/2019, 7:50 AM

## 2019-05-19 ENCOUNTER — Encounter: Admission: EM | Disposition: E | Payer: Self-pay | Source: Home / Self Care | Attending: Internal Medicine

## 2019-05-19 DIAGNOSIS — N186 End stage renal disease: Secondary | ICD-10-CM | POA: Diagnosis not present

## 2019-05-19 DIAGNOSIS — B9562 Methicillin resistant Staphylococcus aureus infection as the cause of diseases classified elsewhere: Secondary | ICD-10-CM | POA: Diagnosis not present

## 2019-05-19 DIAGNOSIS — R7881 Bacteremia: Secondary | ICD-10-CM | POA: Diagnosis not present

## 2019-05-19 DIAGNOSIS — G8194 Hemiplegia, unspecified affecting left nondominant side: Secondary | ICD-10-CM | POA: Diagnosis not present

## 2019-05-19 DIAGNOSIS — E43 Unspecified severe protein-calorie malnutrition: Secondary | ICD-10-CM | POA: Diagnosis not present

## 2019-05-19 DIAGNOSIS — T827XXA Infection and inflammatory reaction due to other cardiac and vascular devices, implants and grafts, initial encounter: Secondary | ICD-10-CM | POA: Diagnosis not present

## 2019-05-19 DIAGNOSIS — N185 Chronic kidney disease, stage 5: Secondary | ICD-10-CM

## 2019-05-19 HISTORY — PX: DIALYSIS/PERMA CATHETER INSERTION: CATH118288

## 2019-05-19 LAB — COMPREHENSIVE METABOLIC PANEL
ALT: 18 U/L (ref 0–44)
AST: 21 U/L (ref 15–41)
Albumin: 1.8 g/dL — ABNORMAL LOW (ref 3.5–5.0)
Alkaline Phosphatase: 77 U/L (ref 38–126)
Anion gap: 10 (ref 5–15)
BUN: 47 mg/dL — ABNORMAL HIGH (ref 8–23)
CO2: 24 mmol/L (ref 22–32)
Calcium: 8.7 mg/dL — ABNORMAL LOW (ref 8.9–10.3)
Chloride: 111 mmol/L (ref 98–111)
Creatinine, Ser: 3.66 mg/dL — ABNORMAL HIGH (ref 0.44–1.00)
GFR calc Af Amer: 13 mL/min — ABNORMAL LOW (ref >60)
GFR calc non Af Amer: 11 mL/min — ABNORMAL LOW (ref >60)
Glucose, Bld: 135 mg/dL — ABNORMAL HIGH (ref 70–99)
Potassium: 3.8 mmol/L (ref 3.5–5.1)
Sodium: 145 mmol/L (ref 135–145)
Total Bilirubin: 1.7 mg/dL — ABNORMAL HIGH (ref 0.3–1.2)
Total Protein: 5 g/dL — ABNORMAL LOW (ref 6.5–8.1)

## 2019-05-19 LAB — CBC
HCT: 19.6 % — ABNORMAL LOW (ref 36.0–46.0)
Hemoglobin: 6.5 g/dL — ABNORMAL LOW (ref 12.0–15.0)
MCH: 31.4 pg (ref 26.0–34.0)
MCHC: 33.2 g/dL (ref 30.0–36.0)
MCV: 94.7 fL (ref 80.0–100.0)
Platelets: 134 10*3/uL — ABNORMAL LOW (ref 150–400)
RBC: 2.07 MIL/uL — ABNORMAL LOW (ref 3.87–5.11)
RDW: 18.2 % — ABNORMAL HIGH (ref 11.5–15.5)
WBC: 9.3 10*3/uL (ref 4.0–10.5)
nRBC: 0 % (ref 0.0–0.2)

## 2019-05-19 LAB — GLUCOSE, CAPILLARY
Glucose-Capillary: 105 mg/dL — ABNORMAL HIGH (ref 70–99)
Glucose-Capillary: 108 mg/dL — ABNORMAL HIGH (ref 70–99)
Glucose-Capillary: 121 mg/dL — ABNORMAL HIGH (ref 70–99)
Glucose-Capillary: 124 mg/dL — ABNORMAL HIGH (ref 70–99)
Glucose-Capillary: 137 mg/dL — ABNORMAL HIGH (ref 70–99)
Glucose-Capillary: 146 mg/dL — ABNORMAL HIGH (ref 70–99)
Glucose-Capillary: 95 mg/dL (ref 70–99)
Glucose-Capillary: 96 mg/dL (ref 70–99)

## 2019-05-19 LAB — PREPARE RBC (CROSSMATCH)

## 2019-05-19 SURGERY — DIALYSIS/PERMA CATHETER INSERTION
Anesthesia: Choice

## 2019-05-19 MED ORDER — SODIUM CHLORIDE 0.9% IV SOLUTION: Freq: Once | INTRAVENOUS | Status: AC

## 2019-05-19 MED ORDER — NEPRO/CARBSTEADY PO LIQD
237.0000 mL | Freq: Two times a day (BID) | ORAL | Status: DC
  Administered 2019-05-21: 14:00:00 237 mL via ORAL

## 2019-05-19 MED ORDER — HEPARIN SODIUM (PORCINE) 10000 UNIT/ML IJ SOLN
INTRAMUSCULAR | Status: AC
  Filled 2019-05-19: qty 1

## 2019-05-19 MED ORDER — FENTANYL CITRATE (PF) 100 MCG/2ML IJ SOLN
INTRAMUSCULAR | Status: DC | PRN
  Administered 2019-05-19: 50 ug via INTRAVENOUS

## 2019-05-19 MED ORDER — FENTANYL CITRATE (PF) 100 MCG/2ML IJ SOLN
INTRAMUSCULAR | Status: AC
  Filled 2019-05-19: qty 2

## 2019-05-19 MED ORDER — SODIUM CHLORIDE 0.9 % IV SOLN
200.0000 mg | Freq: Three times a day (TID) | INTRAVENOUS | Status: DC
  Administered 2019-05-19 – 2019-06-03 (×42): 200 mg via INTRAVENOUS
  Filled 2019-05-19 (×53): qty 200

## 2019-05-19 MED ORDER — MIDAZOLAM HCL 2 MG/2ML IJ SOLN
INTRAMUSCULAR | Status: DC | PRN
  Administered 2019-05-19: 2 mg via INTRAVENOUS

## 2019-05-19 MED ORDER — MIDAZOLAM HCL 5 MG/5ML IJ SOLN
INTRAMUSCULAR | Status: AC
  Filled 2019-05-19: qty 5

## 2019-05-19 MED ORDER — CLINDAMYCIN PHOSPHATE 300 MG/50ML IV SOLN
INTRAVENOUS | Status: AC
  Filled 2019-05-19: qty 50

## 2019-05-19 MED ORDER — CLINDAMYCIN PHOSPHATE 300 MG/50ML IV SOLN: 300.0000 mg | Freq: Once | INTRAVENOUS | Status: DC

## 2019-05-19 MED ORDER — MIDODRINE HCL 5 MG PO TABS
5.0000 mg | ORAL_TABLET | Freq: Once | ORAL | Status: AC
  Administered 2019-05-19: 5 mg via ORAL
  Filled 2019-05-19: qty 1

## 2019-05-19 SURGICAL SUPPLY — 7 items
CATH PALINDROME RT-P 15FX23CM (CATHETERS) ×2 IMPLANT
COVER PROBE U/S 5X48 (MISCELLANEOUS) ×2 IMPLANT
PACK ANGIOGRAPHY (CUSTOM PROCEDURE TRAY) ×2 IMPLANT
SUT MNCRL 4-0 (SUTURE) ×2
SUT MNCRL 4-0 27XMFL (SUTURE) ×1
SUT PROLENE 0 CT 1 30 (SUTURE) ×2 IMPLANT
SUTURE MNCRL 4-0 27XMF (SUTURE) IMPLANT

## 2019-05-19 NOTE — H&P (Signed)
[facility] History & Physical Update  The patient was interviewed and re-examined.  The patient's previous History and Physical has been reviewed and is unchanged.  There is no change in the plan of care. We plan to proceed with the scheduled procedure.  Leotis Pain, MD  05/08/2019, 4:57 PM

## 2019-05-19 NOTE — Progress Notes (Signed)
[facility]  ROUNDING NOTE   Subjective:   Doing fair Appetite appears to be poor Supposed to get Pureed diet Denies pain  Objective:  Vital signs in last 24 hours:  Temp:  [98.3 F (36.8 C)-98.4 F (36.9 C)] 98.3 F (36.8 C) (04/12 0516) Pulse Rate:  [61-71] 61 (04/12 0929) Resp:  [20] 20 (04/12 0516) BP: (87-152)/(50-65) 94/50 (04/12 0929) SpO2:  [84 %-96 %] 96 % (04/12 0520)  Weight change:  Filed Weights   05/16/19 0500 05/17/19 0500 05/18/19 0451  Weight: 68.2 kg 80.1 kg 72 kg    Intake/Output: I/O last 3 completed shifts: In: 120 [P.O.:120] Out: -    Intake/Output this shift:  No intake/output data recorded.  Physical Exam: General: Ill appearing  Head: Normocephalic, atraumatic.   Eyes: Anicteric,   Neck: Supple,   Lungs:  Clear, room air  Heart: Regular  rhythm  Abdomen:  Soft, nontender   Extremities:  no peripheral edema. Feet in b.l soft supports  Neurologic: Left sided weakness.  Alert to Vargas , answers few simple questions  Skin: No lesions  Access: none    Basic Metabolic Panel: Recent Labs  Lab 05/14/19 1051 05/14/19 1051 05/30/2019 0633 06/02/2019 0633 05/16/19 0327 05/16/19 0327 05/17/19 0423 05/18/19 0751 05/09/2019 0624  NA 143   < > 141  --  142  --  142 145 145  K 4.3   < > 3.7  --  3.5  --  3.5 3.5 3.8  CL 112*   < > 110  --  108  --  110 111 111  CO2 16*   < > 19*  --  26  --  24 25 24   GLUCOSE 123*   < > 117*  --  269*  --  96 101* 135*  BUN 44*   < > 48*  --  24*  --  34* 43* 47*  CREATININE 3.54*   < > 3.76*  --  2.16*  --  2.66* 3.24* 3.66*  CALCIUM 9.4   < > 9.3   < > 8.8*   < > 8.7* 8.8* 8.7*  PHOS 3.1  --   --   --  1.3*  --   --   --   --    < > = values in this interval not displayed.    Liver Function Tests: Recent Labs  Lab 05/14/19 1051 05/16/19 0327 05/17/19 0423 05/18/19 0751 06/04/2019 0624  AST  --   --  22 18 21   ALT  --   --  19 17 18   ALKPHOS  --   --  78 72 77  BILITOT  --   --  1.7*  1.5* 1.7*  PROT  --   --  4.9* 5.0* 5.0*  ALBUMIN 2.7* 2.3* 2.1* 2.0* 1.8*   No results for input(s): LIPASE, AMYLASE in the last 168 hours. No results for input(s): AMMONIA in the last 168 hours.  CBC: Recent Labs  Lab 05/12/2019 0757 05/16/19 0327 05/17/19 0423 05/18/19 0751 06/01/2019 0624  WBC 7.7 9.5 9.2 9.5 9.3  HGB 8.9* 8.3* 7.8* 7.1* 6.5*  HCT 25.5* 23.7* 23.1* 20.7* 19.6*  MCV 93.4 90.5 94.3 93.7 94.7  PLT 51* 70* 48* 110* 134*    Cardiac Enzymes: Recent Labs  Lab 06/05/2019 0633  CKTOTAL 10*    BNP: Invalid input(s): POCBNP  CBG: Recent Labs  Lab 05/18/19 1610 05/18/19 1940 05/18/19 2322 05/08/2019 0407 05/27/2019 0751  GLUCAP 142* 116*  125* 12* 54    Microbiology: Results for orders placed or performed during the hospital encounter of 05/24/2019  Blood culture (routine x 2)     Status: Abnormal   Collection Time: 05/30/2019 12:34 PM   Specimen: BLOOD  Result Value Ref Range Status   Specimen Description   Final    BLOOD R UP ARM Performed at [facility], [address], [place], [place] [address]    Special Requests   Final    BOTTLES DRAWN AEROBIC AND ANAEROBIC Blood Culture adequate volume Performed at [facility], [address] [place], [place] [address]    Culture  Setup Time   Final    Organism ID to follow IN BOTH AEROBIC AND ANAEROBIC BOTTLES GRAM POSITIVE COCCI CRITICAL RESULT CALLED TO, READ BACK BY AND VERIFIED WITH: [place] ON 05/11/19 AT 0210 [facility] Performed at [facility], [address] [place], [place] [address]    Culture (A)  Final    STAPHYLOCOCCUS AUREUS SUSCEPTIBILITIES PERFORMED ON PREVIOUS CULTURE WITHIN THE LAST 5 DAYS. Performed at [facility], [place] [address] [place], [place] [address]    Report Status 05/13/2019 FINAL  Final  Blood Culture ID Panel (Reflexed)     Status: Abnormal   Collection Time: 05/18/2019 12:34 PM  Result Value Ref Range Status   Enterococcus species NOT  DETECTED NOT DETECTED Final   Listeria monocytogenes NOT DETECTED NOT DETECTED Final   Staphylococcus species DETECTED (A) NOT DETECTED Final    Comment: CRITICAL RESULT CALLED TO, READ BACK BY AND VERIFIED WITH: SCOTT HALL ON 05/11/19 AT 0210 [facility]    Staphylococcus aureus (BCID) DETECTED (A) NOT DETECTED Final    Comment: Methicillin (oxacillin)-resistant Staphylococcus aureus (MRSA). MRSA is predictably resistant to beta-lactam antibiotics (except ceftaroline). Preferred therapy is vancomycin unless clinically contraindicated. Patient requires contact precautions if  hospitalized. CRITICAL RESULT CALLED TO, READ BACK BY AND VERIFIED WITH: SCOTT HALL ON 05/11/19 AT 0210 [facility]    Methicillin resistance DETECTED (A) NOT DETECTED Final    Comment: CRITICAL RESULT CALLED TO, READ BACK BY AND VERIFIED WITH: SCOTT HALL ON 05/11/19 AT 0210 [place]    Streptococcus species NOT DETECTED NOT DETECTED Final   Streptococcus agalactiae NOT DETECTED NOT DETECTED Final   Streptococcus pneumoniae NOT DETECTED NOT DETECTED Final   Streptococcus pyogenes NOT DETECTED NOT DETECTED Final   Acinetobacter baumannii NOT DETECTED NOT DETECTED Final   Enterobacteriaceae species NOT DETECTED NOT DETECTED Final   Enterobacter cloacae complex NOT DETECTED NOT DETECTED Final   Escherichia coli NOT DETECTED NOT DETECTED Final   Klebsiella oxytoca NOT DETECTED NOT DETECTED Final   Klebsiella pneumoniae NOT DETECTED NOT DETECTED Final   Proteus species NOT DETECTED NOT DETECTED Final   Serratia marcescens NOT DETECTED NOT DETECTED Final   Haemophilus influenzae NOT DETECTED NOT DETECTED Final   Neisseria meningitidis NOT DETECTED NOT DETECTED Final   Pseudomonas aeruginosa NOT DETECTED NOT DETECTED Final   Candida albicans NOT DETECTED NOT DETECTED Final   Candida glabrata NOT DETECTED NOT DETECTED Final   Candida krusei NOT DETECTED NOT DETECTED Final   Candida parapsilosis NOT DETECTED NOT DETECTED Final   Candida  tropicalis NOT DETECTED NOT DETECTED Final    Comment: Performed at [facility], [address] [place], [place] [address]  Blood culture (routine x 2)     Status: Abnormal   Collection Time: 05/14/2019  1:24 PM   Specimen: BLOOD  Result Value Ref Range Status  Specimen Description   Final    BLOOD R LATREAL BICEP Performed at [facility], [address] [place], [place] [address]    Special Requests   Final    BOTTLES DRAWN AEROBIC AND ANAEROBIC Blood Culture adequate volume Performed at [facility], [address] [place], [place] [address]    Culture  Setup Time   Final    IN BOTH AEROBIC AND ANAEROBIC BOTTLES GRAM POSITIVE COCCI CRITICAL VALUE NOTED.  VALUE IS CONSISTENT WITH PREVIOUSLY REPORTED AND CALLED VALUE. Performed at [facility], [address] [place], [place] [address]    Culture METHICILLIN RESISTANT STAPHYLOCOCCUS AUREUS (A)  Final   Report Status 05/13/2019 FINAL  Final   Organism ID, Bacteria METHICILLIN RESISTANT STAPHYLOCOCCUS AUREUS  Final      Susceptibility   Methicillin resistant staphylococcus aureus - MIC*    CIPROFLOXACIN >=8 RESISTANT Resistant     ERYTHROMYCIN >=8 RESISTANT Resistant     GENTAMICIN <=0.5 SENSITIVE Sensitive     OXACILLIN >=4 RESISTANT Resistant     TETRACYCLINE <=1 SENSITIVE Sensitive     VANCOMYCIN <=0.5 SENSITIVE Sensitive     TRIMETH/SULFA >=320 RESISTANT Resistant     CLINDAMYCIN <=0.25 SENSITIVE Sensitive     RIFAMPIN <=0.5 SENSITIVE Sensitive     Inducible Clindamycin NEGATIVE Sensitive     * METHICILLIN RESISTANT STAPHYLOCOCCUS AUREUS  Respiratory Panel by RT PCR (Flu A&B, Covid) - Nasopharyngeal Swab     Status: None   Collection Time: 06/02/2019  1:24 PM   Specimen: Nasopharyngeal Swab  Result Value Ref Range Status   SARS Coronavirus 2 by RT PCR NEGATIVE NEGATIVE Final    Comment: (NOTE) SARS-CoV-2 target nucleic acids are NOT DETECTED. The SARS-CoV-2 RNA is generally  detectable in upper respiratoy specimens during the acute phase of infection. The lowest concentration of SARS-CoV-2 viral copies this assay can detect is 131 copies/mL. A negative result does not preclude SARS-Cov-2 infection and should not be used as the sole basis for treatment or other patient management decisions. A negative result may occur with  improper specimen collection/handling, submission of specimen other than nasopharyngeal swab, presence of viral mutation(s) within the areas targeted by this assay, and inadequate number of viral copies (<131 copies/mL). A negative result must be combined with clinical observations, patient history, and epidemiological information. The expected result is Negative. Fact Sheet for Patients:  PinkCheek.be Fact Sheet for Healthcare Providers:  GravelBags.it This test is not yet ap proved or cleared by the [place] FDA and  has been authorized for detection and/or diagnosis of SARS-CoV-2 by FDA under an Emergency Use Authorization (EUA). This EUA will remain  in effect (meaning this test can be used) for the duration of the COVID-19 declaration under Section 564(b)(1) of the Act, 21 U.S.C. section 360bbb-3(b)(1), unless the authorization is terminated or revoked sooner.    Influenza A by PCR NEGATIVE NEGATIVE Final   Influenza B by PCR NEGATIVE NEGATIVE Final    Comment: (NOTE) The Xpert Xpress SARS-CoV-2/FLU/RSV assay is intended as an aid in  the diagnosis of influenza from Nasopharyngeal swab specimens and  should not be used as a sole basis for treatment. Nasal washings and  aspirates are unacceptable for Xpert Xpress SARS-CoV-2/FLU/RSV  testing. Fact Sheet for Patients: PinkCheek.be Fact Sheet for Healthcare Providers: GravelBags.it This test is not yet approved or cleared by the [place] FDA and  has been  authorized for detection and/or diagnosis of SARS-CoV-2 by  FDA under an Emergency  Use Authorization (EUA). This EUA will remain  in effect (meaning this test can be used) for the duration of the  Covid-19 declaration under Section 564(b)(1) of the Act, 21  U.S.C. section 360bbb-3(b)(1), unless the authorization is  terminated or revoked. Performed at [facility], [address] [place], [place] [address]   MRSA PCR Screening     Status: None   Collection Time: 06/05/2019  6:27 PM   Specimen: Nasopharyngeal  Result Value Ref Range Status   MRSA by PCR NEGATIVE NEGATIVE Final    Comment:        The GeneXpert MRSA Assay (FDA approved for NASAL specimens only), is one component of a comprehensive MRSA colonization surveillance program. It is not intended to diagnose MRSA infection nor to guide or monitor treatment for MRSA infections. Performed at [facility], [address] [place], [place] [address]   Urine culture     Status: Abnormal   Collection Time: 05/10/2019 10:30 PM   Specimen: Urine, Clean Catch  Result Value Ref Range Status   Specimen Description   Final    URINE, CLEAN CATCH Performed at [facility], [address], [place], [place] [address]    Special Requests   Final    NONE Performed at [facility], [place], [place] [address]    Culture (A)  Final    >=100,000 COLONIES/mL KLEBSIELLA PNEUMONIAE >=100,000 COLONIES/mL VANCOMYCIN RESISTANT ENTEROCOCCUS    Report Status 05/14/2019 FINAL  Final   Organism ID, Bacteria KLEBSIELLA PNEUMONIAE (A)  Final   Organism ID, Bacteria VANCOMYCIN RESISTANT ENTEROCOCCUS (A)  Final      Susceptibility   Klebsiella pneumoniae - MIC*    AMPICILLIN >=32 RESISTANT Resistant     CEFAZOLIN <=4 SENSITIVE Sensitive     CEFTRIAXONE <=0.25 SENSITIVE Sensitive     CIPROFLOXACIN <=0.25 SENSITIVE Sensitive     GENTAMICIN <=1 SENSITIVE Sensitive     IMIPENEM 0.5 SENSITIVE  Sensitive     NITROFURANTOIN 64 INTERMEDIATE Intermediate     TRIMETH/SULFA <=20 SENSITIVE Sensitive     AMPICILLIN/SULBACTAM 4 SENSITIVE Sensitive     PIP/TAZO <=4 SENSITIVE Sensitive     * >=100,000 COLONIES/mL KLEBSIELLA PNEUMONIAE   Vancomycin resistant enterococcus - MIC*    AMPICILLIN >=32 RESISTANT Resistant     NITROFURANTOIN 256 RESISTANT Resistant     VANCOMYCIN >=32 RESISTANT Resistant     LINEZOLID 2 SENSITIVE Sensitive     * >=100,000 COLONIES/mL VANCOMYCIN RESISTANT ENTEROCOCCUS  Cath Tip Culture     Status: Abnormal   Collection Time: 05/11/19  1:10 PM   Specimen: Catheter Tip; Other  Result Value Ref Range Status   Specimen Description   Final    CATH TIP Performed at [facility], [address] [place], [place] [address]    Special Requests   Final    NONE Performed at [facility], [address] [place], [place] [address]    Culture (A)  Final    >=100,000 COLONIES/mL METHICILLIN RESISTANT STAPHYLOCOCCUS AUREUS   Report Status 05/13/2019 FINAL  Final   Organism ID, Bacteria METHICILLIN RESISTANT STAPHYLOCOCCUS AUREUS (A)  Final      Susceptibility   Methicillin resistant staphylococcus aureus - MIC*    CIPROFLOXACIN >=8 RESISTANT Resistant     ERYTHROMYCIN >=8 RESISTANT Resistant     GENTAMICIN <=0.5 SENSITIVE Sensitive     OXACILLIN >=4 RESISTANT Resistant     TETRACYCLINE <=1 SENSITIVE Sensitive     VANCOMYCIN  1 SENSITIVE Sensitive     TRIMETH/SULFA >=320 RESISTANT Resistant     CLINDAMYCIN <=0.25 SENSITIVE Sensitive     RIFAMPIN <=0.5 SENSITIVE Sensitive     Inducible Clindamycin NEGATIVE Sensitive     * >=100,000 COLONIES/mL METHICILLIN RESISTANT STAPHYLOCOCCUS AUREUS  Culture, blood (routine x 2)     Status: Abnormal   Collection Time: 05/11/19  4:27 PM   Specimen: BLOOD  Result Value Ref Range Status   Specimen Description   Final    BLOOD A-LINE Performed at [facility], [address] [place], [place]  [address]    Special Requests   Final    BOTTLES DRAWN AEROBIC AND ANAEROBIC Blood Culture adequate volume Performed at [facility], [address] [place], [place] [address]    Culture  Setup Time   Final    GRAM POSITIVE COCCI ANAEROBIC BOTTLE ONLY CRITICAL VALUE NOTED.  VALUE IS CONSISTENT WITH PREVIOUSLY REPORTED AND CALLED VALUE. Performed at [facility], [address] [place], [place] [address]    Culture (A)  Final    STAPHYLOCOCCUS AUREUS SUSCEPTIBILITIES PERFORMED ON PREVIOUS CULTURE WITHIN THE LAST 5 DAYS. Performed at [facility], [place] [address], [place], [place] [address]    Report Status 05/31/2019 FINAL  Final  Culture, blood (routine x 2)     Status: Abnormal   Collection Time: 05/12/19  6:06 AM   Specimen: BLOOD  Result Value Ref Range Status   Specimen Description   Final    BLOOD BLOOD RIGHT HAND Performed at [facility], [address], [place], [place] [address]    Special Requests   Final    BOTTLES DRAWN AEROBIC AND ANAEROBIC Blood Culture adequate volume Performed at [facility], [address] [place], [place] [address]    Culture  Setup Time   Final    GRAM POSITIVE COCCI ANAEROBIC BOTTLE ONLY CRITICAL RESULT CALLED TO, READ BACK BY AND VERIFIED WITH: DAVID BESANTI AT 10PM ON 05/12/2019 BY MOSLEY,J Performed at [facility], [address] [place], [place] [address]    Culture METHICILLIN RESISTANT STAPHYLOCOCCUS AUREUS (A)  Final   Report Status 06/02/2019 FINAL  Final   Organism ID, Bacteria METHICILLIN RESISTANT STAPHYLOCOCCUS AUREUS  Final      Susceptibility   Methicillin resistant staphylococcus aureus - MIC*    CIPROFLOXACIN >=8 RESISTANT Resistant     ERYTHROMYCIN >=8 RESISTANT Resistant     GENTAMICIN <=0.5 SENSITIVE Sensitive     OXACILLIN >=4 RESISTANT Resistant     TETRACYCLINE <=1 SENSITIVE Sensitive     VANCOMYCIN 1 SENSITIVE Sensitive     TRIMETH/SULFA >=320 RESISTANT  Resistant     CLINDAMYCIN <=0.25 SENSITIVE Sensitive     RIFAMPIN <=0.5 SENSITIVE Sensitive     Inducible Clindamycin NEGATIVE Sensitive     * METHICILLIN RESISTANT STAPHYLOCOCCUS AUREUS  CULTURE, BLOOD (ROUTINE X 2) w Reflex to ID Panel     Status: Abnormal   Collection Time: 05/13/19  2:20 PM   Specimen: BLOOD  Result Value Ref Range Status   Specimen Description   Final    BLOOD BLOOD RIGHT HAND Performed at [facility], [address] [place], [place] [address]    Special Requests   Final    BOTTLES DRAWN AEROBIC AND ANAEROBIC Blood Culture adequate volume Performed at [facility], [address] [place], [place] [address]    Culture  Setup Time   Final    [facility] POSITIVE COCCI  ANAEROBIC BOTTLE ONLY CRITICAL RESULT CALLED TO, READ BACK BY AND VERIFIED WITHCurt Jews AT 2633 05/14/19 SDR Performed at  [facility], [address] [place], [place] [address]    Culture (A)  Final    STAPHYLOCOCCUS AUREUS SUSCEPTIBILITIES PERFORMED ON PREVIOUS CULTURE WITHIN THE LAST 5 DAYS. Performed at [facility], [place] [address], [place], [place] [address]    Report Status 05/16/2019 FINAL  Final  CULTURE, BLOOD (ROUTINE X 2) w Reflex to ID Panel     Status: None   Collection Time: 05/13/19  3:39 PM   Specimen: BLOOD  Result Value Ref Range Status   Specimen Description BLOOD BLOOD LEFT HAND  Final   Special Requests   Final    BOTTLES DRAWN AEROBIC ONLY Blood Culture adequate volume   Culture   Final    NO GROWTH 5 DAYS Performed at [facility], [address] [place], [place] [address]    Report Status 05/18/2019 FINAL  Final  CULTURE, BLOOD (ROUTINE X 2) w Reflex to ID Panel     Status: Abnormal   Collection Time: 05/14/19  3:33 PM   Specimen: BLOOD  Result Value Ref Range Status   Specimen Description   Final    BLOOD BLOOD RIGHT HAND Performed at [facility], [address], [place], [place] [address]     Special Requests   Final    BOTTLES DRAWN AEROBIC AND ANAEROBIC Blood Culture adequate volume Performed at [facility], [address] [place], [place] [address]    Culture  Setup Time   Final    GRAM POSITIVE COCCI IN CLUSTERS ANAEROBIC BOTTLE ONLY CRITICAL VALUE NOTED.  VALUE IS CONSISTENT WITH PREVIOUSLY REPORTED AND CALLED VALUE.    Culture (A)  Final    STAPHYLOCOCCUS AUREUS SUSCEPTIBILITIES PERFORMED ON PREVIOUS CULTURE WITHIN THE LAST 5 DAYS. Performed at [facility], [place] [address], [place], [place] [address]    Report Status 05/18/2019 FINAL  Final  Cath Tip Culture     Status: None   Collection Time: 05/14/19  4:24 PM   Specimen: Catheter Tip; Other  Result Value Ref Range Status   Specimen Description   Final    CATH TIP Performed at [facility], [address], [place], [place] [address]    Special Requests   Final    NONE Performed at [facility], [address], [place], [place] [address]    Culture   Final    NO GROWTH 3 DAYS Performed at [facility], [place] [address] [place], [place] [address]    Report Status 05/17/2019 FINAL  Final  CULTURE, BLOOD (ROUTINE X 2) w Reflex to ID Panel     Status: Abnormal   Collection Time: 05/14/19  6:25 PM   Specimen: BLOOD  Result Value Ref Range Status   Specimen Description   Final    BLOOD BLOOD LEFT HAND Performed at [facility], [address] [place], [place] [address]    Special Requests   Final    BOTTLES DRAWN AEROBIC AND ANAEROBIC Blood Culture results may not be optimal due to an inadequate volume of blood received in culture bottles Performed at [facility], [address] [place], [place] [address]    Culture  Setup Time   Final    GRAM POSITIVE COCCI ANAEROBIC BOTTLE ONLY CRITICAL VALUE NOTED.  VALUE IS CONSISTENT WITH PREVIOUSLY REPORTED AND CALLED VALUE. [place] Performed at  [address] [place], [place]  [address]    Culture (A)  Final    STAPHYLOCOCCUS AUREUS SUSCEPTIBILITIES PERFORMED ON PREVIOUS CULTURE WITHIN THE LAST 5 DAYS. Performed at [facility], [place] [address] [place], [place] [address]    Report Status 05/17/2019 FINAL  Final  Culture, blood (Routine X 2) w Reflex to ID Panel     Status: None (Preliminary result)   Collection Time: 05/16/19 10:45 AM   Specimen: BLOOD  Result Value Ref Range Status   Specimen Description BLOOD RIGHT HAND  Final   Special Requests BLOOD Blood Culture adequate volume  Final   Culture   Final    NO GROWTH 3 DAYS Performed at [facility], [address] [place], [place] [address]    Report Status PENDING  Incomplete  Culture, blood (Routine X 2) w Reflex to ID Panel     Status: None (Preliminary result)   Collection Time: 05/16/19 12:38 PM   Specimen: BLOOD  Result Value Ref Range Status   Specimen Description BLOOD RT HAND  Final   Special Requests   Final    BOTTLES DRAWN AEROBIC AND ANAEROBIC Blood Culture adequate volume   Culture   Final    NO GROWTH 3 DAYS Performed at [facility], [address], [place], [place] [address]    Report Status PENDING  Incomplete    Coagulation Studies: No results for input(s): LABPROT, INR in the last 72 hours.  Urinalysis: No results for input(s): COLORURINE, LABSPEC, PHURINE, GLUCOSEU, HGBUR, BILIRUBINUR, KETONESUR, PROTEINUR, UROBILINOGEN, NITRITE, LEUKOCYTESUR in the last 72 hours.  Invalid input(s): APPERANCEUR    Imaging: No results found.   Medications:   . sodium chloride 30 mL (05/18/19 1736)  . DAPTOmycin (CUBICIN)  IV 500 mg (05/18/19 2047)  . levETIRAcetam 500 mg (05/18/19 1739)   . sodium chloride   Intravenous Once  . amLODipine  10 mg Oral Daily  . atorvastatin  10 mg Oral q1800  . carvedilol  25 mg Oral BID WC  . Chlorhexidine Gluconate Cloth  6 each Topical Daily  . feeding supplement (NEPRO CARB STEADY)  237 mL Oral BID BM  . insulin  aspart  0-9 Units Subcutaneous Q4H  . midodrine  5 mg Oral Q M,W,F  . pantoprazole (PROTONIX) IV  40 mg Intravenous QHS  . vancomycin variable dose per unstable renal function (pharmacist dosing)   Does not apply See admin instructions     so far Assessment/ Plan:  [name] is a 74 y.o. black female [name] is a 74 y.o. black female with end stage renal disease on hemodialysis, CVA with left hemiparesis, hypertension, hyperlipidemia, GERD, glaucoma, diabetes mellitus type II, coronary artery disease, gout, seizure disorder who was admitted to [facility] 06[facility]05[facility]2019 for Hypokalemia [E87.6] Shock ([place]) [R57.9] ESRD (end stage renal disease) ([place]) [N18.6] Hypotension, unspecified hypotension type [I95.9] Anemia, unspecified type [D64.9]  [facility] MWF  63.5kg  # End Stage Renal Disease: last hemodialysis treatment was Friday 4/2. No acute indication for dialysis.  Appreciate vascular input - temp HD catheter removed  - Plan on permcath placement when blood culture are neg. Tentatively on monday    # Hypotension: with MRSA bacteremia/sepsis weaned off vasopressors: Persistent positive blood cultures from 4/7.   Neg results from 4/9 so far - Continue vancomycin and daptomycin. As per ID recommendations from 4/9,     #. Anemia with chronic kidney disease: with iron  deficiency and folate deficiency. Subcu EPO on 4/7.  Lab Results  Component Value Date   HGB 6.5 (L) 06/06/2019    # Secondary Hyperparathyroidism:  Off binders at present Lab Results  Component Value Date   PTH 232 (H) 05/11/2019   CALCIUM 8.7 (L) 05/25/2019   PHOS 1.3 (L) 05/16/2019    #Malnutrition:   Currently on pureed diet  albumin 2.0 on 05/18/2019     LOS: 9 Jalan Fariss 4/12/202110:36 AM

## 2019-05-19 NOTE — Op Note (Signed)
OPERATIVE NOTE    PRE-OPERATIVE DIAGNOSIS: 1. ESRD   POST-OPERATIVE DIAGNOSIS: same as above  PROCEDURE: 1. Ultrasound guidance for vascular access to the left internal jugular vein 2. Fluoroscopic guidance for placement of catheter 3. Placement of a 23 cm tip to cuff tunneled hemodialysis catheter via the left internal jugular vein  SURGEON: Leotis Pain, MD  ANESTHESIA:  Local with Moderate conscious sedation for approximately 15 minutes using 2 mg of Versed and 50 mcg of Fentanyl  ESTIMATED BLOOD LOSS: 10 cc  FLUORO TIME: less than one minute  CONTRAST: none  FINDING(S): 1.  Patent left internal jugular vein  SPECIMEN(S):  None  INDICATIONS:   Crystal Vargas is a 74 y.o. female who presents with renal failure.  The patient needs long term dialysis access for their ESRD, and a Permcath is necessary.  Risks and benefits are discussed and informed consent is obtained.    DESCRIPTION: After obtaining full informed written consent, the patient was brought back to the vascular suited. The patient's left neck and chest were sterilely prepped and draped in a sterile surgical field was created. Moderate conscious sedation was administered during a face to face encounter with the patient throughout the procedure with my supervision of the RN administering medicines and monitoring the patient's vital signs, pulse oximetry, telemetry and mental status throughout from the start of the procedure until the patient was taken to the recovery room.  The left internal jugular vein was visualized with ultrasound and found to be patent. It was then accessed under direct ultrasound guidance and a permanent image was recorded. A wire was placed. After skin nick and dilatation, the peel-away sheath was placed over the wire. I then turned my attention to an area under the clavicle. Approximately 1-2 fingerbreadths below the clavicle a small counterincision was created and tunneled from the subclavicular  incision to the access site. Using fluoroscopic guidance, a 23 centimeter tip to cuff tunneled hemodialysis catheter was selected, and tunneled from the subclavicular incision to the access site. It was then placed through the peel-away sheath and the peel-away sheath was removed. Using fluoroscopic guidance the catheter tips were parked in the right atrium. The appropriate distal connectors were placed. It withdrew blood well and flushed easily with heparinized saline and a concentrated heparin solution was then placed. It was secured to the chest wall with 2 Prolene sutures. The access incision was closed single 4-0 Monocryl. A 4-0 Monocryl pursestring suture was placed around the exit site. Sterile dressings were placed. The patient tolerated the procedure well and was taken to the recovery room in stable condition.  COMPLICATIONS: None  CONDITION: Stable  Leotis Pain  05/16/2019, 5:16 PM   This note was created with Dragon Medical transcription system. Any errors in dictation are purely unintentional.

## 2019-05-19 NOTE — TOC Progression Note (Signed)
Transition of Care [facility]) - Progression Note    Patient Details  Name: Crystal Vargas MRN: [id number] Date of Birth: 10-09-1945  Transition of Care [facility] CM/SW [place], LCSW Phone Number: 05/30/2019, 11:19 AM  Clinical Narrative:  Damaris Schooner to nurse manager at [facility]. She stated that Daptomycin is a very expensive antibiotic and wanted to see if there was a less expensive option. Sent message to attending, ID, and ID pharmacist to see. 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 Sent message to attending and speech therapist to notify.   Expected Discharge Plan: [place] Barriers to Discharge: Continued Medical Work up  Expected Discharge Plan and Services Expected Discharge Plan: [place] arrangements for the past 2 months: [place]                                       Social Determinants of Health (SDOH) Interventions    Readmission Risk Interventions No flowsheet data found.

## 2019-05-19 NOTE — Progress Notes (Signed)
Client's O2 was noted to be 84% on room air, 2L O2 commenced via nasal canula.

## 2019-05-19 NOTE — Progress Notes (Signed)
PT Cancellation Note  Patient Details Name: Crystal Vargas MRN: [id number] DOB: [birth year]   Cancelled Treatment:    Reason Eval/Treat Not Completed: Other (comment)(Pt with low hemoglobin (6.5) with low BP per chart as well. PT to hold and initiate exertional activity per PT protocol when patient is more medically appropriate.)  Lieutenant Diego PT, DPT 1:25 PM,05/12/2019

## 2019-05-19 NOTE — Consult Note (Signed)
I have placed a request via Secure Chat to Dr. Lenise Herald requesting photos of the wound areas of concern to be placed in the EMR.   [place], [place], [place]

## 2019-05-19 NOTE — Progress Notes (Addendum)
PROGRESS NOTE    Belissa Kooy  PPI:[id number] DOB: 02-05-1946 DOA: 06/04/2019 PCP: Marisa Hua, MD       Assessment & Plan:   Principal Problem:   MRSA bacteremia Active Problems:   Diabetes ([place])   Essential hypertension   ESRD on dialysis ([facility])   Shock ([place])   Status post CVA   Schatzki's ring   MRSA bacteremia: secondary infected HD catheter which has been removed w/ MRSA bacteremia. S/p placement of temporary HD catheter. Continue on IV daptomycin as per ID. Blood cx 05/14/19 are still growing MRSA. Repeat blood cx 05/16/19 NGTD. ID following and recs apprec   Septic shock: secondary to above. Continue on IV abxs. Resolved  ESRD: on HD. Temp catheter removed secondary to recurrent MRSA bacteremia. Will place permcath today as per vasc surg. HD as per nephro  Thrombocytopenia: etiology unclear, possibly secondary to infection stated above. 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 Will transfuse 2 units of PRBCs today. Will continue to monitor   Severe protein calorie malnutrition: w/ poor po intake due to chronic issues w/ dysphagia. Continue on nutrional supplements. Dietitian is recommending dobhoff tube & with tube feeds. Discussed w/ pt's daughter, [name], and will get back to me tomorrow    Left heel wound: wound care consulted   Weakness: w/ left hemiparesis. PT recs SNF. OT consulted   DM2: will continue on SSI w/ accuchecks  HLD: will continue on statin   HTN: will continue carvedilol, amlodipine   GERD: continue on PPI   CAD: will continue on carvedilol, amlodipine & statin.    DVT prophylaxis: SCDs Code Status:  Full  Family Communication: discussed pt's care w/ pt's daughter, [name], and answered her questions  Disposition Plan: PT recs SNF, will likely d/c to SNF    Consultants:   ID  Vascular surgery  ICU  nephro   Procedures:    Antimicrobials: daptomycin    Subjective: Pt c/o malaise   Objective: Vitals:    05/18/19 1829 05/18/19 2044 05/24/2019 0516 05/25/2019 0520  BP:  (!) 87/65 (!) 152/57   Pulse: 66 64 71 68  Resp:  20 20   Temp:  98.4 F (36.9 C) 98.3 F (36.8 C)   TempSrc:  Oral Oral   SpO2: 96% 96% (!) 84% 96%  Weight:      Height:        Intake/Output Summary (Last 24 hours) at 05/18/2019 0802 Last data filed at 05/22/2019 0316 Gross per 24 hour  Intake 120 ml  Output --  Net 120 ml   Filed Weights   05/16/19 0500 05/17/19 0500 05/18/19 0451  Weight: 68.2 kg 80.1 kg 72 kg    Examination:  General exam: Appears calm and comfortable. Frail appearing. Appears older than stated age  Respiratory system: decreased breath sounds b/l . No rales  Cardiovascular system: S1 & S2 +. No rubs, gallops or clicks.  Gastrointestinal system: Abdomen is nondistended, soft and nontender. Hypoactive bowel sounds heard. Central nervous system: Lethargic. Moves all 4 extremities  Psychiatry: Judgement and insight appear abnormal. Flat mood and affect    Data Reviewed: I have personally reviewed following labs and imaging studies  CBC: Recent Labs  Lab 05/25/2019 0757 05/16/19 0327 05/17/19 0423 05/18/19 0751 06/02/2019 0624  WBC 7.7 9.5 9.2 9.5 9.3  HGB 8.9* 8.3* 7.8* 7.1* 6.5*  HCT 25.5* 23.7* 23.1* 20.7* 19.6*  MCV 93.4 90.5 94.3 93.7 94.7  PLT 51* 70* 48* 110* 134*  Basic Metabolic Panel: Recent Labs  Lab 05/14/19 1051 05/14/19 1051 05/16/2019 0633 05/16/19 0327 05/17/19 0423 05/18/19 0751 05/30/2019 0624  NA 143   < > 141 142 142 145 145  K 4.3   < > 3.7 3.5 3.5 3.5 3.8  CL 112*   < > 110 108 110 111 111  CO2 16*   < > 19* 26 24 25 24   GLUCOSE 123*   < > 117* 269* 96 101* 135*  BUN 44*   < > 48* 24* 34* 43* 47*  CREATININE 3.54*   < > 3.76* 2.16* 2.66* 3.24* 3.66*  CALCIUM 9.4   < > 9.3 8.8* 8.7* 8.8* 8.7*  PHOS 3.1  --   --  1.3*  --   --   --    < > = values in this interval not displayed.   GFR: Estimated Creatinine Clearance: 12.2 mL/min (A) (by C-G formula based  on SCr of 3.66 mg/dL (H)). Liver Function Tests: Recent Labs  Lab 05/14/19 1051 05/16/19 0327 05/17/19 0423 05/18/19 0751 06/02/2019 0624  AST  --   --  22 18 21   ALT  --   --  19 17 18   ALKPHOS  --   --  78 72 77  BILITOT  --   --  1.7* 1.5* 1.7*  PROT  --   --  4.9* 5.0* 5.0*  ALBUMIN 2.7* 2.3* 2.1* 2.0* 1.8*   No results for input(s): LIPASE, AMYLASE in the last 168 hours. No results for input(s): AMMONIA in the last 168 hours. Coagulation Profile: No results for input(s): INR, PROTIME in the last 168 hours. Cardiac Enzymes: Recent Labs  Lab 05/10/2019 0633  CKTOTAL 10*   BNP (last 3 results) No results for input(s): PROBNP in the last 8760 hours. HbA1C: No results for input(s): HGBA1C in the last 72 hours. CBG: Recent Labs  Lab 05/18/19 1610 05/18/19 1940 05/18/19 2322 05/14/2019 0407 06/06/2019 0751  GLUCAP 142* 116* 125* 137* 96   Lipid Profile: No results for input(s): CHOL, HDL, LDLCALC, TRIG, CHOLHDL, LDLDIRECT in the last 72 hours. Thyroid Function Tests: No results for input(s): TSH, T4TOTAL, FREET4, T3FREE, THYROIDAB in the last 72 hours. Anemia Panel: No results for input(s): VITAMINB12, FOLATE, FERRITIN, TIBC, IRON, RETICCTPCT in the last 72 hours. Sepsis Labs: No results for input(s): PROCALCITON, LATICACIDVEN in the last 168 hours.  Recent Results (from the past 240 hour(s))  Blood culture (routine x 2)     Status: Abnormal   Collection Time: 05/30/2019 12:34 PM   Specimen: BLOOD  Result Value Ref Range Status   Specimen Description   Final    BLOOD R UP ARM Performed at [facility], [address] [place], [place] [address]    Special Requests   Final    BOTTLES DRAWN AEROBIC AND ANAEROBIC Blood Culture adequate volume Performed at [facility], [address] [place], [place] [address]    Culture  Setup Time   Final    Organism ID to follow IN BOTH AEROBIC AND ANAEROBIC BOTTLES GRAM POSITIVE COCCI CRITICAL RESULT CALLED  TO, READ BACK BY AND VERIFIED WITH: [place] ON 05/11/19 AT 0210 [facility] Performed at [facility], [address] [place], [place] [address]    Culture (A)  Final    STAPHYLOCOCCUS AUREUS SUSCEPTIBILITIES PERFORMED ON PREVIOUS CULTURE WITHIN THE LAST 5 DAYS. Performed at [facility], [place] [address] [place], [place] [address]    Report Status 05/13/2019  FINAL  Final  Blood Culture ID Panel (Reflexed)     Status: Abnormal   Collection Time: 05/14/2019 12:34 PM  Result Value Ref Range Status   Enterococcus species NOT DETECTED NOT DETECTED Final   Listeria monocytogenes NOT DETECTED NOT DETECTED Final   Staphylococcus species DETECTED (A) NOT DETECTED Final    Comment: CRITICAL RESULT CALLED TO, READ BACK BY AND VERIFIED WITH: [place] ON 05/11/19 AT 0210 [facility]    Staphylococcus aureus (BCID) DETECTED (A) NOT DETECTED Final    Comment: Methicillin (oxacillin)-resistant Staphylococcus aureus (MRSA). MRSA is predictably resistant to beta-lactam antibiotics (except ceftaroline). Preferred therapy is vancomycin unless clinically contraindicated. Patient requires contact precautions if  hospitalized. CRITICAL RESULT CALLED TO, READ BACK BY AND VERIFIED WITH: [place] ON 05/11/19 AT 0210 [facility]    Methicillin resistance DETECTED (A) NOT DETECTED Final    Comment: CRITICAL RESULT CALLED TO, READ BACK BY AND VERIFIED WITH: [place] H[place] ON 05/11/19 AT 0210 [place]    Streptococcus species NOT DETECTED NOT DETECTED Final   Streptococcus agalactiae NOT DETECTED NOT DETECTED Final   Streptococcus pneumoniae NOT DETECTED NOT DETECTED Final   Streptococcus pyogenes NOT DETECTED NOT DETECTED Final   Acinetobacter baumannii NOT DETECTED NOT DETECTED Final   Enterobacteriaceae species NOT DETECTED NOT DETECTED Final   Enterobacter cloacae complex NOT DETECTED NOT DETECTED Final   Escherichia coli NOT DETECTED NOT DETECTED Final   Klebsiella oxytoca NOT DETECTED NOT DETECTED Final   Klebsiella  pneumoniae NOT DETECTED NOT DETECTED Final   Proteus species NOT DETECTED NOT DETECTED Final   Serratia marcescens NOT DETECTED NOT DETECTED Final   Haemophilus influenzae NOT DETECTED NOT DETECTED Final   Neisseria meningitidis NOT DETECTED NOT DETECTED Final   Pseudomonas aeruginosa NOT DETECTED NOT DETECTED Final   Candida albicans NOT DETECTED NOT DETECTED Final   Candida glabrata NOT DETECTED NOT DETECTED Final   Candida krusei NOT DETECTED NOT DETECTED Final   Candida parapsilosis NOT DETECTED NOT DETECTED Final   Candida tropicalis NOT DETECTED NOT DETECTED Final    Comment: Performed at [facility], [address] [place], [place] [address]  Blood culture (routine x 2)     Status: Abnormal   Collection Time: 05/16/2019  1:24 PM   Specimen: BLOOD  Result Value Ref Range Status   Specimen Description   Final    BLOOD R LATREAL BICEP Performed at [facility], [address] [place], [place] [address]    Special Requests   Final    BOTTLES DRAWN AEROBIC AND ANAEROBIC Blood Culture adequate volume Performed at [facility], [address] [place], [place] [address]    Culture  Setup Time   Final    IN BOTH AEROBIC AND ANAEROBIC BOTTLES GRAM POSITIVE COCCI CRITICAL VALUE NOTED.  VALUE IS CONSISTENT WITH PREVIOUSLY REPORTED AND CALLED VALUE. Performed at [facility], [address] [place], [place] [address]    Culture METHICILLIN RESISTANT STAPHYLOCOCCUS AUREUS (A)  Final   Report Status 05/13/2019 FINAL  Final   Organism ID, Bacteria METHICILLIN RESISTANT STAPHYLOCOCCUS AUREUS  Final      Susceptibility   Methicillin resistant staphylococcus aureus - MIC*    CIPROFLOXACIN >=8 RESISTANT Resistant     ERYTHROMYCIN >=8 RESISTANT Resistant     GENTAMICIN <=0.5 SENSITIVE Sensitive     OXACILLIN >=4 RESISTANT Resistant     TETRACYCLINE <=1 SENSITIVE Sensitive     VANCOMYCIN <=0.5 SENSITIVE Sensitive     TRIMETH/SULFA >=320 RESISTANT  Resistant     CLINDAMYCIN <=0.25 SENSITIVE Sensitive     RIFAMPIN <=0.5 SENSITIVE Sensitive     Inducible Clindamycin NEGATIVE Sensitive     * METHICILLIN RESISTANT STAPHYLOCOCCUS AUREUS  Respiratory Panel by RT PCR (Flu A&B, Covid) - Nasopharyngeal Swab     Status: None   Collection Time: 05/31/2019  1:24 PM   Specimen: Nasopharyngeal Swab  Result Value Ref Range Status   SARS Coronavirus 2 by RT PCR NEGATIVE NEGATIVE Final    Comment: (NOTE) SARS-CoV-2 target nucleic acids are NOT DETECTED. The SARS-CoV-2 RNA is generally detectable in upper respiratoy specimens during the acute phase of infection. The lowest concentration of SARS-CoV-2 viral copies this assay can detect is 131 copies/mL. A negative result does not preclude SARS-Cov-2 infection and should not be used as the sole basis for treatment or other patient management decisions. A negative result may occur with  improper specimen collection/handling, submission of specimen other than nasopharyngeal swab, presence of viral mutation(s) within the areas targeted by this assay, and inadequate number of viral copies (<131 copies/mL). A negative result must be combined with clinical observations, patient history, and epidemiological information. The expected result is Negative. Fact Sheet for Patients:  PinkCheek.be Fact Sheet for Healthcare Providers:  GravelBags.it This test is not yet ap proved or cleared by the [place] FDA and  has been authorized for detection and/or diagnosis of SARS-CoV-2 by FDA under an Emergency Use Authorization (EUA). This EUA will remain  in effect (meaning this test can be used) for the duration of the COVID-19 declaration under Section 564(b)(1) of the Act, 21 U.S.C. section 360bbb-3(b)(1), unless the authorization is terminated or revoked sooner.    Influenza A by PCR NEGATIVE NEGATIVE Final   Influenza B by PCR NEGATIVE NEGATIVE  Final    Comment: (NOTE) The Xpert Xpress SARS-CoV-2/FLU/RSV assay is intended as an aid in  the diagnosis of influenza from Nasopharyngeal swab specimens and  should not be used as a sole basis for treatment. Nasal washings and  aspirates are unacceptable for Xpert Xpress SARS-CoV-2/FLU/RSV  testing. Fact Sheet for Patients: PinkCheek.be Fact Sheet for Healthcare Providers: GravelBags.it This test is not yet approved or cleared by the [place] FDA and  has been authorized for detection and/or diagnosis of SARS-CoV-2 by  FDA under an Emergency Use Authorization (EUA). This EUA will remain  in effect (meaning this test can be used) for the duration of the  Covid-19 declaration under Section 564(b)(1) of the Act, 21  U.S.C. section 360bbb-3(b)(1), unless the authorization is  terminated or revoked. Performed at [facility], [address] [place], [place] [address]   MRSA PCR Screening     Status: None   Collection Time: 05/19/2019  6:27 PM   Specimen: Nasopharyngeal  Result Value Ref Range Status   MRSA by PCR NEGATIVE NEGATIVE Final    Comment:        The GeneXpert MRSA Assay (FDA approved for NASAL specimens only), is one component of a comprehensive MRSA colonization surveillance program. It is not intended to diagnose MRSA infection nor to guide or monitor treatment for MRSA infections. Performed at [facility], [address] [place], [place] [address]   Urine culture     Status: Abnormal   Collection Time: 05/31/2019 10:30 PM   Specimen: Urine, Clean Catch  Result Value Ref Range Status   Specimen Description   Final    URINE, CLEAN CATCH Performed at [facility], [address] [place], [place] [address]    Special Requests   Final    NONE Performed at [facility], [address] [place], [place] [address]    Culture (A)  Final    >=100,000 COLONIES/mL  KLEBSIELLA PNEUMONIAE >=100,000 COLONIES/mL VANCOMYCIN RESISTANT ENTEROCOCCUS    Report Status 05/14/2019 FINAL  Final   Organism ID, Bacteria KLEBSIELLA PNEUMONIAE (A)  Final   Organism ID, Bacteria VANCOMYCIN RESISTANT ENTEROCOCCUS (A)  Final      Susceptibility   Klebsiella pneumoniae - MIC*    AMPICILLIN >=32 RESISTANT Resistant     CEFAZOLIN <=4 SENSITIVE Sensitive     CEFTRIAXONE <=0.25 SENSITIVE Sensitive     CIPROFLOXACIN <=0.25 SENSITIVE Sensitive     GENTAMICIN <=1 SENSITIVE Sensitive     IMIPENEM 0.5 SENSITIVE Sensitive     NITROFURANTOIN 64 INTERMEDIATE Intermediate     TRIMETH/SULFA <=20 SENSITIVE Sensitive     AMPICILLIN/SULBACTAM 4 SENSITIVE Sensitive     PIP/TAZO <=4 SENSITIVE Sensitive     * >=100,000 COLONIES/mL KLEBSIELLA PNEUMONIAE   Vancomycin resistant enterococcus - MIC*    AMPICILLIN >=32 RESISTANT Resistant     NITROFURANTOIN 256 RESISTANT Resistant     VANCOMYCIN >=32 RESISTANT Resistant     LINEZOLID 2 SENSITIVE Sensitive     * >=100,000 COLONIES/mL VANCOMYCIN RESISTANT ENTEROCOCCUS  Cath Tip Culture     Status: Abnormal   Collection Time: 05/11/19  1:10 PM   Specimen: Catheter Tip; Other  Result Value Ref Range Status   Specimen Description   Final    CATH TIP Performed at [facility], [address] [place], [place] [address]    Special Requests   Final    NONE Performed at [facility], [address] [place], [place] [address]    Culture (A)  Final    >=100,000 COLONIES/mL METHICILLIN RESISTANT STAPHYLOCOCCUS AUREUS   Report Status 05/13/2019 FINAL  Final   Organism ID, Bacteria METHICILLIN RESISTANT STAPHYLOCOCCUS AUREUS (A)  Final      Susceptibility   Methicillin resistant staphylococcus aureus - MIC*    CIPROFLOXACIN >=8 RESISTANT Resistant     ERYTHROMYCIN >=8 RESISTANT Resistant     GENTAMICIN <=0.5 SENSITIVE Sensitive     OXACILLIN >=4 RESISTANT Resistant     TETRACYCLINE <=1 SENSITIVE Sensitive      VANCOMYCIN 1 SENSITIVE Sensitive     TRIMETH/SULFA >=320 RESISTANT Resistant     CLINDAMYCIN <=0.25 SENSITIVE Sensitive     RIFAMPIN <=0.5 SENSITIVE Sensitive     Inducible Clindamycin NEGATIVE Sensitive     * >=100,000 COLONIES/mL METHICILLIN RESISTANT STAPHYLOCOCCUS AUREUS  Culture, blood (routine x 2)     Status: Abnormal   Collection Time: 05/11/19  4:27 PM   Specimen: BLOOD  Result Value Ref Range Status   Specimen Description   Final    BLOOD A-LINE Performed at [facility], [address] [place], [place] [address]    Special Requests   Final    BOTTLES DRAWN AEROBIC AND ANAEROBIC Blood Culture adequate volume Performed at [facility], [address] [place], [place] [address]    Culture  Setup Time   Final    GRAM POSITIVE COCCI ANAEROBIC BOTTLE ONLY CRITICAL VALUE NOTED.  VALUE IS CONSISTENT WITH PREVIOUSLY REPORTED AND CALLED VALUE. Performed at [facility], [address] [place], [place] [address]    Culture (A)  Final    STAPHYLOCOCCUS AUREUS SUSCEPTIBILITIES PERFORMED ON PREVIOUS CULTURE WITHIN THE LAST 5 DAYS. Performed at [facility], [place] [address], [place], [place] [address]    Report Status 06/04/2019 FINAL  Final  Culture, blood (routine x 2)     Status: Abnormal   Collection Time: 05/12/19  6:06 AM   Specimen: BLOOD  Result Value Ref Range Status   Specimen Description   Final    BLOOD BLOOD RIGHT HAND Performed at [facility], [address] [place], [place] [address]    Special Requests   Final    BOTTLES DRAWN AEROBIC AND ANAEROBIC Blood Culture adequate volume Performed at [facility], [address] [place], [place] [address]    Culture  Setup Time   Final    GRAM POSITIVE COCCI ANAEROBIC BOTTLE ONLY CRITICAL RESULT CALLED TO, READ BACK BY AND VERIFIED WITH: [place] ON 05/12/2019 BY MOSLEY,J Performed at [facility], [address] [place], [place]  [address]    Culture METHICILLIN RESISTANT STAPHYLOCOCCUS AUREUS (A)  Final   Report Status 05/19/2019 FINAL  Final   Organism ID, Bacteria METHICILLIN RESISTANT STAPHYLOCOCCUS AUREUS  Final      Susceptibility   Methicillin resistant staphylococcus aureus - MIC*    CIPROFLOXACIN >=8 RESISTANT Resistant     ERYTHROMYCIN >=8 RESISTANT Resistant     GENTAMICIN <=0.5 SENSITIVE Sensitive     OXACILLIN >=4 RESISTANT Resistant     TETRACYCLINE <=1 SENSITIVE Sensitive     VANCOMYCIN 1 SENSITIVE Sensitive     TRIMETH/SULFA >=320 RESISTANT Resistant     CLINDAMYCIN <=0.25 SENSITIVE Sensitive     RIFAMPIN <=0.5 SENSITIVE Sensitive     Inducible Clindamycin NEGATIVE Sensitive     * METHICILLIN RESISTANT STAPHYLOCOCCUS AUREUS  CULTURE, BLOOD (ROUTINE X 2) w Reflex to ID Panel     Status: Abnormal   Collection Time: 05/13/19  2:20 PM   Specimen: BLOOD  Result Value Ref Range Status   Specimen Description   Final    BLOOD BLOOD RIGHT HAND Performed at [facility], [address], [place], [place] [address]    Special Requests   Final    BOTTLES DRAWN AEROBIC AND ANAEROBIC Blood Culture adequate volume Performed at [facility], [address] [place], [place] [address]    Culture  Setup Time   Final    GRAM POSITIVE COCCI ANAEROBIC BOTTLE ONLY CRITICAL RESULT CALLED TO, READ BACK BY AND VERIFIED WITH: [facility] KATSOUDAS AT 9937 05/14/19 SDR Performed at [facility], [address] [place], [place] [address]    Culture (A)  Final    STAPHYLOCOCCUS AUREUS SUSCEPTIBILITIES PERFORMED ON PREVIOUS CULTURE WITHIN THE LAST 5 DAYS. Performed at [facility], [place] [address], [place], [place] [address]    Report Status 05/16/2019 FINAL  Final  CULTURE, BLOOD (ROUTINE X 2) w Reflex to ID Panel     Status: None   Collection Time: 05/13/19  3:39 PM   Specimen: BLOOD  Result Value Ref Range Status   Specimen Description BLOOD BLOOD LEFT HAND  Final   Special  Requests   Final    BOTTLES DRAWN AEROBIC ONLY Blood Culture adequate volume   Culture   Final    NO GROWTH 5 DAYS Performed at [facility], [address], [place],  [address]    Report Status 05/18/2019 FINAL  Final  CULTURE, BLOOD (ROUTINE X 2) w Reflex to ID Panel     Status: Abnormal   Collection Time: 05/14/19  3:33 PM   Specimen: BLOOD  Result Value Ref Range Status   Specimen Description   Final    BLOOD BLOOD RIGHT HAND Performed at [facility], [address], [place], [place] [address]    Special Requests   Final    BOTTLES DRAWN AEROBIC AND ANAEROBIC Blood Culture adequate volume Performed at [facility], [address] [place], [place] [address]    Culture  Setup Time   Final    GRAM POSITIVE COCCI IN CLUSTERS ANAEROBIC BOTTLE ONLY CRITICAL VALUE NOTED.  VALUE IS CONSISTENT WITH PREVIOUSLY REPORTED AND CALLED VALUE.    Culture (A)  Final    STAPHYLOCOCCUS AUREUS SUSCEPTIBILITIES PERFORMED ON PREVIOUS CULTURE WITHIN THE LAST 5 DAYS. Performed at [facility], [place] [address] [place], [place] [address]    Report Status 05/18/2019 FINAL  Final  Cath Tip Culture     Status: None   Collection Time: 05/14/19  4:24 PM   Specimen: Catheter Tip; Other  Result Value Ref Range Status   Specimen Description   Final    CATH TIP Performed at [facility], [address] [place], [place] [address]    Special Requests   Final    NONE Performed at [facility], [address], [place], [place] [address]    Culture   Final    NO GROWTH 3 DAYS Performed at [facility], [place] [address], [place], [place] [address]    Report Status 05/17/2019 FINAL  Final  CULTURE, BLOOD (ROUTINE X 2) w Reflex to ID Panel     Status: Abnormal   Collection Time: 05/14/19  6:25 PM   Specimen: BLOOD  Result Value Ref Range Status   Specimen Description   Final    BLOOD BLOOD LEFT HAND Performed at [facility],  [address] [place], [place] [address]    Special Requests   Final    BOTTLES DRAWN AEROBIC AND ANAEROBIC Blood Culture results may not be optimal due to an inadequate volume of blood received in culture bottles Performed at [facility], [address] [place], [place] [address]    Culture  Setup Time   Final    GRAM POSITIVE COCCI ANAEROBIC BOTTLE ONLY CRITICAL VALUE NOTED.  VALUE IS CONSISTENT WITH PREVIOUSLY REPORTED AND CALLED VALUE. [place] Performed at [facility], [address] [place],  [address]    Culture (A)  Final    STAPHYLOCOCCUS AUREUS SUSCEPTIBILITIES PERFORMED ON PREVIOUS CULTURE WITHIN THE LAST 5 DAYS. Performed at [facility], [place] [address], [place],  [address]    Report Status 05/17/2019 FINAL  Final  Culture, blood (Routine X 2) w Reflex to ID Panel     Status: None (Preliminary result)   Collection Time: 05/16/19 10:45 AM   Specimen: BLOOD  Result Value Ref Range Status   Specimen Description BLOOD RIGHT HAND  Final   Special Requests BLOOD Blood Culture adequate volume  Final   Culture   Final    NO GROWTH 3 DAYS Performed at [facility], [address] [place],  [address]    Report Status PENDING  Incomplete  Culture, blood (Routine X 2) w Reflex to ID Panel     Status: None (Preliminary result)   Collection Time: 05/16/19 12:38 PM   Specimen: BLOOD  Result Value Ref Range Status   Specimen Description BLOOD RT HAND  Final   Special Requests   Final    BOTTLES DRAWN AEROBIC AND  ANAEROBIC Blood Culture adequate volume   Culture   Final    NO GROWTH 3 DAYS Performed at [facility], [address] [place], [place] [address]    Report Status PENDING  Incomplete         Radiology Studies: No results found.      Scheduled Meds: . sodium chloride   Intravenous Once  . amLODipine  10 mg Oral Daily  . atorvastatin  10 mg Oral q1800  . carvedilol  25 mg Oral BID WC   . Chlorhexidine Gluconate Cloth  6 each Topical Daily  . feeding supplement (ENSURE ENLIVE)  237 mL Oral TID  . insulin aspart  0-9 Units Subcutaneous Q4H  . midodrine  5 mg Oral Q M,W,F  . pantoprazole (PROTONIX) IV  40 mg Intravenous QHS  . vancomycin variable dose per unstable renal function (pharmacist dosing)   Does not apply See admin instructions   Continuous Infusions: . sodium chloride 30 mL (05/18/19 1736)  . DAPTOmycin (CUBICIN)  IV 500 mg (05/18/19 2047)  . levETIRAcetam 500 mg (05/18/19 1739)     LOS: 9 days    Time spent: 30 mins     Wyvonnia Dusky, MD [facility] Pager [phone]  If 7PM-7AM, please contact night-coverage [email or web address] 05/20/2019, 8:02 AM

## 2019-05-19 NOTE — Progress Notes (Signed)
OT Cancellation Note  Patient Details Name: Crystal Vargas MRN: [id number] DOB: [birth year]   Cancelled Treatment:    Reason Eval/Treat Not Completed: Medical issues which prohibited therapy. Pt with low hemoglobin (6.5) with low BP per chart as well. Per therapy protocol, will hold OT session this date and attempt when medically appropriate to participate in therapy.   Dessie Coma, M.S. OTR/L  05/24/2019, 3:01 PM

## 2019-05-19 NOTE — Progress Notes (Signed)
ID Pt is lthargic but on moving her she opens eyes and says to leave her alone  Patient Vitals for the past 24 hrs:  BP Temp Temp src Pulse Resp SpO2 Height Weight  05/16/2019 2011 (!) 112/53 98.2 F (36.8 C) Oral 62 - 99 % - -  05/25/2019 1944 111/64 98.4 F (36.9 C) Oral - 18 100 % - -  05/12/2019 1800 (!) 86/45 98.4 F (36.9 C) Oral - 18 100 % - -  05/24/2019 1750 108/73 - - 65 19 98 % - -  06/06/2019 1745 101/63 - - (!) 57 17 100 % - -  05/12/2019 1730 (!) 89/62 - - 61 (!) 23 97 % - -  06/02/2019 1725 (!) 86/58 - - 62 (!) 22 97 % - -  05/12/2019 1630 96/63 99.1 F (37.3 C) Oral 63 15 93 % - -  05/18/2019 1608 93/66 99 F (37.2 C) Oral 64 (!) 21 92 % 5\' 2"  (1.575 m) 72 kg  05/14/2019 1607 - 99 F (37.2 C) Oral - - - - -  05/21/2019 1529 (!) 94/58 - - - - - - -  05/29/2019 1407 (!) 82/50 99.2 F (37.3 C) Oral 61 20 98 % - -  05/09/2019 1334 (!) 87/50 99 F (37.2 C) Oral 64 20 97 % - -  06/04/2019 1300 - 99 F (37.2 C) Oral - - - - -  05/17/2019 0929 (!) 94/50 - - 61 - - - -  05/31/2019 0520 - - - 68 - 96 % - -  05/25/2019 0516 (!) 152/57 98.3 F (36.8 C) Oral 71 20 (!) 84 % - -  05/18/19 2044 (!) 87/65 98.4 F (36.9 C) Oral 64 20 96 % - -    Chest b/l air entry Hs -irregular Left hemiplegia  Labs CBC Latest Ref Rng & Units 05/11/2019 05/18/2019 05/17/2019  WBC 4.0 - 10.5 K/uL 9.3 9.5 9.2  Hemoglobin 12.0 - 15.0 g/dL 6.5(L) 7.1(L) 7.8(L)  Hematocrit 36.0 - 46.0 % 19.6(L) 20.7(L) 23.1(L)  Platelets 150 - 400 K/uL 134(L) 110(L) 48(L)   CMP Latest Ref Rng & Units 05/08/2019 05/18/2019 05/17/2019  Glucose 70 - 99 mg/dL 135(H) 101(H) 96  BUN 8 - 23 mg/dL 47(H) 43(H) 34(H)  Creatinine 0.44 - 1.00 mg/dL 3.66(H) 3.24(H) 2.66(H)  Sodium 135 - 145 mmol/L 145 145 142  Potassium 3.5 - 5.1 mmol/L 3.8 3.5 3.5  Chloride 98 - 111 mmol/L 111 111 110  CO2 22 - 32 mmol/L 24 25 24   Calcium 8.9 - 10.3 mg/dL 8.7(L) 8.8(L) 8.7(L)  Total Protein 6.5 - 8.1 g/dL 5.0(L) 5.0(L) 4.9(L)  Total Bilirubin 0.3 - 1.2 mg/dL 1.7(H)  1.5(H) 1.7(H)  Alkaline Phos 38 - 126 U/L 77 72 78  AST 15 - 41 U/L 21 18 22   ALT 0 - 44 U/L 18 17 19    Impression/recommendation  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 She will need minimum 6 weeks of IV ceftaroline is added to daptomycin today- she will need Combo MRSA treatment There may an issue with NH as dapto is expensive Will explore other combo options  Discussed with care team

## 2019-05-19 NOTE — Consult Note (Signed)
WOC Nurse Consult Note: Reason for Consult: right foot wound Note foot wound is on the left heel Wound type: unstageable pressure injury; left heel Pressure Injury POA: Yes Measurement:3.5cm x 2.5cm x 0cm  Wound bed:100% black stable eschar Drainage (amount, consistency, odor) none Periwound:intact  Dressing procedure/placement/frequency: Offload at all times, boots in the room Paint with betadine daily, keep dry.   Discussed POC with patient and bedside nurse.  Re consult if needed, will not follow at this time. Thanks  Jovonna Nickell [employer], RN,CWOCN, CNS, [place] [phone])

## 2019-05-19 NOTE — Progress Notes (Signed)
Asked to perform tee in am per id request. Pt is in a procedure now. Will assess in am and determine timing and appropriateness of procedure.

## 2019-05-20 ENCOUNTER — Encounter: Admission: EM | Disposition: E | Payer: Self-pay | Source: Home / Self Care | Attending: Internal Medicine

## 2019-05-20 ENCOUNTER — Inpatient Hospital Stay: Payer: Medicare Other

## 2019-05-20 ENCOUNTER — Encounter: Payer: Self-pay | Admitting: Cardiology

## 2019-05-20 DIAGNOSIS — E43 Unspecified severe protein-calorie malnutrition: Secondary | ICD-10-CM | POA: Diagnosis not present

## 2019-05-20 DIAGNOSIS — B9562 Methicillin resistant Staphylococcus aureus infection as the cause of diseases classified elsewhere: Secondary | ICD-10-CM | POA: Diagnosis not present

## 2019-05-20 DIAGNOSIS — R7881 Bacteremia: Secondary | ICD-10-CM | POA: Diagnosis not present

## 2019-05-20 DIAGNOSIS — N186 End stage renal disease: Secondary | ICD-10-CM | POA: Diagnosis not present

## 2019-05-20 DIAGNOSIS — R1310 Dysphagia, unspecified: Secondary | ICD-10-CM | POA: Diagnosis not present

## 2019-05-20 LAB — COMPREHENSIVE METABOLIC PANEL
ALT: 17 U/L (ref 0–44)
AST: 22 U/L (ref 15–41)
Albumin: 1.9 g/dL — ABNORMAL LOW (ref 3.5–5.0)
Alkaline Phosphatase: 83 U/L (ref 38–126)
Anion gap: 9 (ref 5–15)
BUN: 56 mg/dL — ABNORMAL HIGH (ref 8–23)
CO2: 21 mmol/L — ABNORMAL LOW (ref 22–32)
Calcium: 8.8 mg/dL — ABNORMAL LOW (ref 8.9–10.3)
Chloride: 118 mmol/L — ABNORMAL HIGH (ref 98–111)
Creatinine, Ser: 4.1 mg/dL — ABNORMAL HIGH (ref 0.44–1.00)
GFR calc Af Amer: 12 mL/min — ABNORMAL LOW (ref >60)
GFR calc non Af Amer: 10 mL/min — ABNORMAL LOW (ref >60)
Glucose, Bld: 120 mg/dL — ABNORMAL HIGH (ref 70–99)
Potassium: 5.1 mmol/L (ref 3.5–5.1)
Sodium: 148 mmol/L — ABNORMAL HIGH (ref 135–145)
Total Bilirubin: 1.7 mg/dL — ABNORMAL HIGH (ref 0.3–1.2)
Total Protein: 5.5 g/dL — ABNORMAL LOW (ref 6.5–8.1)

## 2019-05-20 LAB — GLUCOSE, CAPILLARY
Glucose-Capillary: 133 mg/dL — ABNORMAL HIGH (ref 70–99)
Glucose-Capillary: 118 mg/dL — ABNORMAL HIGH (ref 70–99)
Glucose-Capillary: 109 mg/dL — ABNORMAL HIGH (ref 70–99)
Glucose-Capillary: 283 mg/dL — ABNORMAL HIGH (ref 70–99)

## 2019-05-20 LAB — CBC
HCT: 34.2 % — ABNORMAL LOW (ref 36.0–46.0)
Hemoglobin: 11.4 g/dL — ABNORMAL LOW (ref 12.0–15.0)
MCH: 30 pg (ref 26.0–34.0)
MCHC: 33.3 g/dL (ref 30.0–36.0)
MCV: 90 fL (ref 80.0–100.0)
Platelets: 138 10*3/uL — ABNORMAL LOW (ref 150–400)
RBC: 3.8 MIL/uL — ABNORMAL LOW (ref 3.87–5.11)
RDW: 20 % — ABNORMAL HIGH (ref 11.5–15.5)
WBC: 7.8 10*3/uL (ref 4.0–10.5)
nRBC: 0 % (ref 0.0–0.2)

## 2019-05-20 SURGERY — ECHOCARDIOGRAM, TRANSESOPHAGEAL
Anesthesia: Moderate Sedation

## 2019-05-20 MED ORDER — ALTEPLASE 2 MG IJ SOLR
2.0000 mg | Freq: Once | INTRAMUSCULAR | Status: AC
  Administered 2019-05-20: 2 mg

## 2019-05-20 MED ORDER — RENA-VITE PO TABS
1.0000 | ORAL_TABLET | Freq: Every day | ORAL | Status: DC
  Administered 2019-05-20 – 2019-05-29 (×9): 1 via ORAL
  Filled 2019-05-20 (×10): qty 1

## 2019-05-20 MED ORDER — ASCORBIC ACID 500 MG PO TABS
500.0000 mg | ORAL_TABLET | Freq: Two times a day (BID) | ORAL | Status: DC
  Administered 2019-05-20 – 2019-05-29 (×14): 500 mg via ORAL
  Filled 2019-05-20 (×16): qty 1

## 2019-05-20 NOTE — Progress Notes (Signed)
[facility]  ROUNDING NOTE   Subjective:   Seen during dialysis treatment Poor blood flow from catheter Packed with Activase  Objective:  Vital signs in last 24 hours:  Temp:  [97 F (36.1 C)-99.2 F (37.3 C)] 97 F (36.1 C) (04/13 0907) Pulse Rate:  [57-109] 62 (04/13 1230) Resp:  [15-32] 30 (04/13 1230) BP: (82-179)/(45-108) 148/85 (04/13 1230) SpO2:  [92 %-100 %] 93 % (04/13 1215) Weight:  [72 kg] 72 kg (04/12 1608)  Weight change:  Filed Weights   05/17/19 0500 05/18/19 0451 05/11/2019 1608  Weight: 80.1 kg 72 kg 72 kg    Intake/Output: I/O last 3 completed shifts: In: 730 [P.O.:30; I.V.:80; Blood:620] Out: -    Intake/Output this shift:  Total I/O In: -  Out: 1300 [Other:1300]  Physical Exam: General: Ill appearing  Head: Normocephalic, atraumatic.   Eyes: Anicteric,   Neck: Supple,   Lungs:  Clear, room air  Heart: Regular  rhythm  Abdomen:  Soft, nontender   Extremities:  no peripheral edema. Feet in b.l soft supports  Neurologic: Left sided weakness.  Alert to self , answers few simple questions  Skin: No lesions  Access: none    Basic Metabolic Panel: Recent Labs  Lab 05/14/19 1051 05/21/2019 0633 05/16/19 0327 05/16/19 0327 05/17/19 0423 05/17/19 0423 05/18/19 0751 05/28/2019 0624 05/24/2019 0722  NA 143   < > 142  --  142  --  145 145 148*  K 4.3   < > 3.5  --  3.5  --  3.5 3.8 5.1  CL 112*   < > 108  --  110  --  111 111 118*  CO2 16*   < > 26  --  24  --  25 24 21*  GLUCOSE 123*   < > 269*  --  96  --  101* 135* 120*  BUN 44*   < > 24*  --  34*  --  43* 47* 56*  CREATININE 3.54*   < > 2.16*  --  2.66*  --  3.24* 3.66* 4.10*  CALCIUM 9.4   < > 8.8*   < > 8.7*   < > 8.8* 8.7* 8.8*  PHOS 3.1  --  1.3*  --   --   --   --   --   --    < > = values in this interval not displayed.    Liver Function Tests: Recent Labs  Lab 05/16/19 0327 05/17/19 0423 05/18/19 0751 06/06/2019 0624 05/13/2019 0722  AST  --  22 18 21 22   ALT  --   19 17 18 17   ALKPHOS  --  78 72 77 83  BILITOT  --  1.7* 1.5* 1.7* 1.7*  PROT  --  4.9* 5.0* 5.0* 5.5*  ALBUMIN 2.3* 2.1* 2.0* 1.8* 1.9*   No results for input(s): LIPASE, AMYLASE in the last 168 hours. No results for input(s): AMMONIA in the last 168 hours.  CBC: Recent Labs  Lab 05/16/19 0327 05/17/19 0423 05/18/19 0751 06/05/2019 0624 05/23/2019 0928  WBC 9.5 9.2 9.5 9.3 7.8  HGB 8.3* 7.8* 7.1* 6.5* 11.4*  HCT 23.7* 23.1* 20.7* 19.6* 34.2*  MCV 90.5 94.3 93.7 94.7 90.0  PLT 70* 48* 110* 134* 138*    Cardiac Enzymes: Recent Labs  Lab 05/16/2019 0633  CKTOTAL 10*    BNP: Invalid input(s): POCBNP  CBG: Recent Labs  Lab 05/21/2019 1621 05/15/2019 1747 05/18/2019 1954 05/29/2019 2353 05/30/2019 0354  GLUCAP  121* 124* 146* 108* 40*    Microbiology: Results for orders placed or performed during the hospital encounter of 05/18/2019  Blood culture (routine x 2)     Status: Abnormal   Collection Time: 06/05/2019 12:34 PM   Specimen: BLOOD  Result Value Ref Range Status   Specimen Description   Final    BLOOD R UP ARM Performed at [facility], [address], [place], [place] [address]    Special Requests   Final    BOTTLES DRAWN AEROBIC AND ANAEROBIC Blood Culture adequate volume Performed at [facility], [address] [place], [place] [address]    Culture  Setup Time   Final    Organism ID to follow IN BOTH AEROBIC AND ANAEROBIC BOTTLES GRAM POSITIVE COCCI CRITICAL RESULT CALLED TO, READ BACK BY AND VERIFIED WITH: Luther ON 05/11/19 AT 0210 [facility] Performed at [facility], [address] [place], [place] [address]    Culture (A)  Final    STAPHYLOCOCCUS AUREUS SUSCEPTIBILITIES PERFORMED ON PREVIOUS CULTURE WITHIN THE LAST 5 DAYS. Performed at [facility], [place] [address] [place], [place] [address]    Report Status 05/13/2019 FINAL  Final  Blood Culture ID Panel (Reflexed)     Status: Abnormal   Collection Time: 05/13/2019 12:34  PM  Result Value Ref Range Status   Enterococcus species NOT DETECTED NOT DETECTED Final   Listeria monocytogenes NOT DETECTED NOT DETECTED Final   Staphylococcus species DETECTED (A) NOT DETECTED Final    Comment: CRITICAL RESULT CALLED TO, READ BACK BY AND VERIFIED WITH: SCOTT HALL ON 05/11/19 AT 0210 [facility]    Staphylococcus aureus (BCID) DETECTED (A) NOT DETECTED Final    Comment: Methicillin (oxacillin)-resistant Staphylococcus aureus (MRSA). MRSA is predictably resistant to beta-lactam antibiotics (except ceftaroline). Preferred therapy is vancomycin unless clinically contraindicated. Patient requires contact precautions if  hospitalized. CRITICAL RESULT CALLED TO, READ BACK BY AND VERIFIED WITH: SCOTT HALL ON 05/11/19 AT 0210 [facility]    Methicillin resistance DETECTED (A) NOT DETECTED Final    Comment: CRITICAL RESULT CALLED TO, READ BACK BY AND VERIFIED WITH: SCOTT HALL ON 05/11/19 AT 0210 [place]    Streptococcus species NOT DETECTED NOT DETECTED Final   Streptococcus agalactiae NOT DETECTED NOT DETECTED Final   Streptococcus pneumoniae NOT DETECTED NOT DETECTED Final   Streptococcus pyogenes NOT DETECTED NOT DETECTED Final   Acinetobacter baumannii NOT DETECTED NOT DETECTED Final   Enterobacteriaceae species NOT DETECTED NOT DETECTED Final   Enterobacter cloacae complex NOT DETECTED NOT DETECTED Final   Escherichia coli NOT DETECTED NOT DETECTED Final   Klebsiella oxytoca NOT DETECTED NOT DETECTED Final   Klebsiella pneumoniae NOT DETECTED NOT DETECTED Final   Proteus species NOT DETECTED NOT DETECTED Final   Serratia marcescens NOT DETECTED NOT DETECTED Final   Haemophilus influenzae NOT DETECTED NOT DETECTED Final   Neisseria meningitidis NOT DETECTED NOT DETECTED Final   Pseudomonas aeruginosa NOT DETECTED NOT DETECTED Final   Candida albicans NOT DETECTED NOT DETECTED Final   Candida glabrata NOT DETECTED NOT DETECTED Final   Candida krusei NOT DETECTED NOT DETECTED Final   Candida  parapsilosis NOT DETECTED NOT DETECTED Final   Candida tropicalis NOT DETECTED NOT DETECTED Final    Comment: Performed at [facility], [address] [place], [place] [address]  Blood culture (routine x 2)     Status: Abnormal   Collection Time: 05/13/2019  1:24 PM   Specimen: BLOOD  Result Value Ref Range  Status   Specimen Description   Final    BLOOD R LATREAL BICEP Performed at [facility], [address], [place], [place] [address]    Special Requests   Final    BOTTLES DRAWN AEROBIC AND ANAEROBIC Blood Culture adequate volume Performed at [facility], [address] [place], [place] [address]    Culture  Setup Time   Final    IN BOTH AEROBIC AND ANAEROBIC BOTTLES GRAM POSITIVE COCCI CRITICAL VALUE NOTED.  VALUE IS CONSISTENT WITH PREVIOUSLY REPORTED AND CALLED VALUE. Performed at [facility], [address] [place], [place] [address]    Culture METHICILLIN RESISTANT STAPHYLOCOCCUS AUREUS (A)  Final   Report Status 05/13/2019 FINAL  Final   Organism ID, Bacteria METHICILLIN RESISTANT STAPHYLOCOCCUS AUREUS  Final      Susceptibility   Methicillin resistant staphylococcus aureus - MIC*    CIPROFLOXACIN >=8 RESISTANT Resistant     ERYTHROMYCIN >=8 RESISTANT Resistant     GENTAMICIN <=0.5 SENSITIVE Sensitive     OXACILLIN >=4 RESISTANT Resistant     TETRACYCLINE <=1 SENSITIVE Sensitive     VANCOMYCIN <=0.5 SENSITIVE Sensitive     TRIMETH/SULFA >=320 RESISTANT Resistant     CLINDAMYCIN <=0.25 SENSITIVE Sensitive     RIFAMPIN <=0.5 SENSITIVE Sensitive     Inducible Clindamycin NEGATIVE Sensitive     * METHICILLIN RESISTANT STAPHYLOCOCCUS AUREUS  Respiratory Panel by RT PCR (Flu A&B, Covid) - Nasopharyngeal Swab     Status: None   Collection Time: 05/09/2019  1:24 PM   Specimen: Nasopharyngeal Swab  Result Value Ref Range Status   SARS Coronavirus 2 by RT PCR NEGATIVE NEGATIVE Final    Comment: (NOTE) SARS-CoV-2 target nucleic acids  are NOT DETECTED. The SARS-CoV-2 RNA is generally detectable in upper respiratoy specimens during the acute phase of infection. The lowest concentration of SARS-CoV-2 viral copies this assay can detect is 131 copies/mL. A negative result does not preclude SARS-Cov-2 infection and should not be used as the sole basis for treatment or other patient management decisions. A negative result may occur with  improper specimen collection/handling, submission of specimen other than nasopharyngeal swab, presence of viral mutation(s) within the areas targeted by this assay, and inadequate number of viral copies (<131 copies/mL). A negative result must be combined with clinical observations, patient history, and epidemiological information. The expected result is Negative. Fact Sheet for Patients:  PinkCheek.be Fact Sheet for Healthcare Providers:  GravelBags.it This test is not yet ap proved or cleared by the [place] FDA and  has been authorized for detection and/or diagnosis of SARS-CoV-2 by FDA under an Emergency Use Authorization (EUA). This EUA will remain  in effect (meaning this test can be used) for the duration of the COVID-19 declaration under Section 564(b)(1) of the Act, 21 U.S.C. section 360bbb-3(b)(1), unless the authorization is terminated or revoked sooner.    Influenza A by PCR NEGATIVE NEGATIVE Final   Influenza B by PCR NEGATIVE NEGATIVE Final    Comment: (NOTE) The Xpert Xpress SARS-CoV-2/FLU/RSV assay is intended as an aid in  the diagnosis of influenza from Nasopharyngeal swab specimens and  should not be used as a sole basis for treatment. Nasal washings and  aspirates are unacceptable for Xpert Xpress SARS-CoV-2/FLU/RSV  testing. Fact Sheet for Patients: PinkCheek.be Fact Sheet for Healthcare Providers: GravelBags.it This test is not yet approved or  cleared by the [place] FDA and  has been authorized for detection and/or diagnosis of SARS-CoV-2 by  FDA  under an Emergency Use Authorization (EUA). This EUA will remain  in effect (meaning this test can be used) for the duration of the  Covid-19 declaration under Section 564(b)(1) of the Act, 21  U.S.C. section 360bbb-3(b)(1), unless the authorization is  terminated or revoked. Performed at [facility], [address] [place], [place] [address]   MRSA PCR Screening     Status: None   Collection Time: 05/14/2019  6:27 PM   Specimen: Nasopharyngeal  Result Value Ref Range Status   MRSA by PCR NEGATIVE NEGATIVE Final    Comment:        The GeneXpert MRSA Assay (FDA approved for NASAL specimens only), is one component of a comprehensive MRSA colonization surveillance program. It is not intended to diagnose MRSA infection nor to guide or monitor treatment for MRSA infections. Performed at [facility], [address] [place], [place] [address]   Urine culture     Status: Abnormal   Collection Time: 05/13/2019 10:30 PM   Specimen: Urine, Clean Catch  Result Value Ref Range Status   Specimen Description   Final    URINE, CLEAN CATCH Performed at [facility], [address], [place], [place] [address]    Special Requests   Final    NONE Performed at [facility], [place], [place] [address]    Culture (A)  Final    >=100,000 COLONIES/mL KLEBSIELLA PNEUMONIAE >=100,000 COLONIES/mL VANCOMYCIN RESISTANT ENTEROCOCCUS    Report Status 05/14/2019 FINAL  Final   Organism ID, Bacteria KLEBSIELLA PNEUMONIAE (A)  Final   Organism ID, Bacteria VANCOMYCIN RESISTANT ENTEROCOCCUS (A)  Final      Susceptibility   Klebsiella pneumoniae - MIC*    AMPICILLIN >=32 RESISTANT Resistant     CEFAZOLIN <=4 SENSITIVE Sensitive     CEFTRIAXONE <=0.25 SENSITIVE Sensitive     CIPROFLOXACIN <=0.25 SENSITIVE Sensitive     GENTAMICIN <=1  SENSITIVE Sensitive     IMIPENEM 0.5 SENSITIVE Sensitive     NITROFURANTOIN 64 INTERMEDIATE Intermediate     TRIMETH/SULFA <=20 SENSITIVE Sensitive     AMPICILLIN/SULBACTAM 4 SENSITIVE Sensitive     PIP/TAZO <=4 SENSITIVE Sensitive     * >=100,000 COLONIES/mL KLEBSIELLA PNEUMONIAE   Vancomycin resistant enterococcus - MIC*    AMPICILLIN >=32 RESISTANT Resistant     NITROFURANTOIN 256 RESISTANT Resistant     VANCOMYCIN >=32 RESISTANT Resistant     LINEZOLID 2 SENSITIVE Sensitive     * >=100,000 COLONIES/mL VANCOMYCIN RESISTANT ENTEROCOCCUS  Cath Tip Culture     Status: Abnormal   Collection Time: 05/11/19  1:10 PM   Specimen: Catheter Tip; Other  Result Value Ref Range Status   Specimen Description   Final    CATH TIP Performed at [facility], [address] [place], [place] [address]    Special Requests   Final    NONE Performed at [facility], [address] [place], [place] [address]    Culture (A)  Final    >=100,000 COLONIES/mL METHICILLIN RESISTANT STAPHYLOCOCCUS AUREUS   Report Status 05/13/2019 FINAL  Final   Organism ID, Bacteria METHICILLIN RESISTANT STAPHYLOCOCCUS AUREUS (A)  Final      Susceptibility   Methicillin resistant staphylococcus aureus - MIC*    CIPROFLOXACIN >=8 RESISTANT Resistant     ERYTHROMYCIN >=8 RESISTANT Resistant     GENTAMICIN <=0.5 SENSITIVE Sensitive     OXACILLIN >=4 RESISTANT Resistant     TETRACYCLINE <=1 SENSITIVE Sensitive  VANCOMYCIN 1 SENSITIVE Sensitive     TRIMETH/SULFA >=320 RESISTANT Resistant     CLINDAMYCIN <=0.25 SENSITIVE Sensitive     RIFAMPIN <=0.5 SENSITIVE Sensitive     Inducible Clindamycin NEGATIVE Sensitive     * >=100,000 COLONIES/mL METHICILLIN RESISTANT STAPHYLOCOCCUS AUREUS  Culture, blood (routine x 2)     Status: Abnormal   Collection Time: 05/11/19  4:27 PM   Specimen: BLOOD  Result Value Ref Range Status   Specimen Description   Final    BLOOD A-LINE Performed at [facility], [address] [place], [place] [address]    Special Requests   Final    BOTTLES DRAWN AEROBIC AND ANAEROBIC Blood Culture adequate volume Performed at [facility], [address] [place], [place] [address]    Culture  Setup Time   Final    GRAM POSITIVE COCCI ANAEROBIC BOTTLE ONLY CRITICAL VALUE NOTED.  VALUE IS CONSISTENT WITH PREVIOUSLY REPORTED AND CALLED VALUE. Performed at [facility], [address] [place], [place] [address]    Culture (A)  Final    STAPHYLOCOCCUS AUREUS SUSCEPTIBILITIES PERFORMED ON PREVIOUS CULTURE WITHIN THE LAST 5 DAYS. Performed at [facility], [place] [address], [place], [place] [address]    Report Status 06/06/2019 FINAL  Final  Culture, blood (routine x 2)     Status: Abnormal   Collection Time: 05/12/19  6:06 AM   Specimen: BLOOD  Result Value Ref Range Status   Specimen Description   Final    BLOOD BLOOD RIGHT HAND Performed at [facility], [address] [place], [place] [address]    Special Requests   Final    BOTTLES DRAWN AEROBIC AND ANAEROBIC Blood Culture adequate volume Performed at [facility], [address] [place], [place] [address]    Culture  Setup Time   Final    GRAM POSITIVE COCCI ANAEROBIC BOTTLE ONLY CRITICAL RESULT CALLED TO, READ BACK BY AND VERIFIED WITH: DAVID BESANTI AT 10PM ON 05/12/2019 BY MOSLEY,J Performed at [facility], [address] [place], [place] [address]    Culture METHICILLIN RESISTANT STAPHYLOCOCCUS AUREUS (A)  Final   Report Status 06/04/2019 FINAL  Final   Organism ID, Bacteria METHICILLIN RESISTANT STAPHYLOCOCCUS AUREUS  Final      Susceptibility   Methicillin resistant staphylococcus aureus - MIC*    CIPROFLOXACIN >=8 RESISTANT Resistant     ERYTHROMYCIN >=8 RESISTANT Resistant     GENTAMICIN <=0.5 SENSITIVE Sensitive     OXACILLIN >=4 RESISTANT Resistant     TETRACYCLINE <=1 SENSITIVE Sensitive     VANCOMYCIN 1  SENSITIVE Sensitive     TRIMETH/SULFA >=320 RESISTANT Resistant     CLINDAMYCIN <=0.25 SENSITIVE Sensitive     RIFAMPIN <=0.5 SENSITIVE Sensitive     Inducible Clindamycin NEGATIVE Sensitive     * METHICILLIN RESISTANT STAPHYLOCOCCUS AUREUS  CULTURE, BLOOD (ROUTINE X 2) w Reflex to ID Panel     Status: Abnormal   Collection Time: 05/13/19  2:20 PM   Specimen: BLOOD  Result Value Ref Range Status   Specimen Description   Final    BLOOD BLOOD RIGHT HAND Performed at [facility], [address], [place], [place] [address]    Special Requests   Final    BOTTLES DRAWN AEROBIC AND ANAEROBIC Blood Culture adequate volume Performed at [facility], [address], [place], [place] [address]    Culture  Setup Time   Final    GRAM POSITIVE  COCCI ANAEROBIC BOTTLE ONLY CRITICAL RESULT CALLED TO, READ BACK BY AND VERIFIED WITHCurt Jews AT 5732 05/14/19 SDR Performed at [facility], [address] [place], [place] [address]    Culture (A)  Final    STAPHYLOCOCCUS AUREUS SUSCEPTIBILITIES PERFORMED ON PREVIOUS CULTURE WITHIN THE LAST 5 DAYS. Performed at [facility], [place] [address], [place], [place] [address]    Report Status 05/16/2019 FINAL  Final  CULTURE, BLOOD (ROUTINE X 2) w Reflex to ID Panel     Status: None   Collection Time: 05/13/19  3:39 PM   Specimen: BLOOD  Result Value Ref Range Status   Specimen Description BLOOD BLOOD LEFT HAND  Final   Special Requests   Final    BOTTLES DRAWN AEROBIC ONLY Blood Culture adequate volume   Culture   Final    NO GROWTH 5 DAYS Performed at [facility], [address] [place], [place] [address]    Report Status 05/18/2019 FINAL  Final  CULTURE, BLOOD (ROUTINE X 2) w Reflex to ID Panel     Status: Abnormal (Preliminary result)   Collection Time: 05/14/19  3:33 PM   Specimen: BLOOD  Result Value Ref Range Status   Specimen Description   Final    BLOOD BLOOD RIGHT HAND Performed  at [facility], [address] [place], [place] [address]    Special Requests   Final    BOTTLES DRAWN AEROBIC AND ANAEROBIC Blood Culture adequate volume Performed at [facility], [address] [place], [place] [address]    Culture  Setup Time   Final    GRAM POSITIVE COCCI IN CLUSTERS ANAEROBIC BOTTLE ONLY CRITICAL VALUE NOTED.  VALUE IS CONSISTENT WITH PREVIOUSLY REPORTED AND CALLED VALUE.    Culture (A)  Final    STAPHYLOCOCCUS AUREUS Sent to [place] for further susceptibility testing. Performed at [facility], [place] [address] [place], [place] [address]    Report Status PENDING  Incomplete  Cath Tip Culture     Status: None   Collection Time: 05/14/19  4:24 PM   Specimen: Catheter Tip; Other  Result Value Ref Range Status   Specimen Description   Final    CATH TIP Performed at [facility], [address], [place], [place] [address]    Special Requests   Final    NONE Performed at [facility], [address] [place], [place] [address]    Culture   Final    NO GROWTH 3 DAYS Performed at [facility], [place] [address] [place], [place] [address]    Report Status 05/17/2019 FINAL  Final  CULTURE, BLOOD (ROUTINE X 2) w Reflex to ID Panel     Status: Abnormal   Collection Time: 05/14/19  6:25 PM   Specimen: BLOOD  Result Value Ref Range Status   Specimen Description   Final    BLOOD BLOOD LEFT HAND Performed at [facility], [address] [place], [place] [address]    Special Requests   Final    BOTTLES DRAWN AEROBIC AND ANAEROBIC Blood Culture results may not be optimal due to an inadequate volume of blood received in culture bottles Performed at [facility], [address], [place],  [address]    Culture  Setup Time   Final    GRAM POSITIVE COCCI ANAEROBIC BOTTLE ONLY CRITICAL VALUE NOTED.  VALUE IS CONSISTENT WITH PREVIOUSLY REPORTED AND CALLED VALUE. [place] Performed at [facility], [address], [place], [place] [address]    Culture (A)  Final    STAPHYLOCOCCUS AUREUS SUSCEPTIBILITIES PERFORMED ON PREVIOUS CULTURE WITHIN THE LAST 5 DAYS. Performed at [facility], [place] [address], [place], [place] [address]    Report Status 05/17/2019 FINAL  Final  Culture, blood (Routine X 2) w Reflex to ID Panel     Status: None (Preliminary result)   Collection Time: 05/16/19 10:45 AM   Specimen: BLOOD  Result Value Ref Range Status   Specimen Description BLOOD RIGHT HAND  Final   Special Requests BLOOD Blood Culture adequate volume  Final   Culture   Final    NO GROWTH 4 DAYS Performed at [facility], [address] [place], [place] [address]    Report Status PENDING  Incomplete  Culture, blood (Routine X 2) w Reflex to ID Panel     Status: None (Preliminary result)   Collection Time: 05/16/19 12:38 PM   Specimen: BLOOD  Result Value Ref Range Status   Specimen Description BLOOD RT HAND  Final   Special Requests   Final    BOTTLES DRAWN AEROBIC AND ANAEROBIC Blood Culture adequate volume   Culture   Final    NO GROWTH 4 DAYS Performed at [facility], [address], [place], [place] [address]    Report Status PENDING  Incomplete    Coagulation Studies: No results for input(s): LABPROT, INR in the last 72 hours.  Urinalysis: No results for input(s): COLORURINE, LABSPEC, PHURINE, GLUCOSEU, HGBUR, BILIRUBINUR, KETONESUR, PROTEINUR, UROBILINOGEN, NITRITE, LEUKOCYTESUR in the last 72 hours.  Invalid input(s): APPERANCEUR    Imaging: PERIPHERAL VASCULAR CATHETERIZATION  Result Date: 05/09/2019 See op note  DG Chest Port 1 View  Result Date: 05/30/2019 CLINICAL DATA:  Tachypnea.  Renal failure EXAM: PORTABLE CHEST 1 VIEW COMPARISON:  May 11, 2019. FINDINGS: Central catheter tip is in the right atrium. No pneumothorax. There is cardiomegaly with pulmonary venous hypertension. There are small pleural effusions  bilaterally. There is interstitial thickening throughout the lungs as well as patchy areas of airspace opacity, primarily in the perihilar regions. There is aortic atherosclerosis.  No evident bone lesions. IMPRESSION: Central catheter tip in right atrium. Cardiomegaly with pulmonary vascular congestion and small pleural effusions. Findings most likely due to interstitial and alveolar edema. Overall appearance consistent with congestive heart failure. Areas of superimposed pneumonia cannot be excluded radiographically. Aortic Atherosclerosis (ICD10-I70.0). Electronically Signed   By: Lowella Grip III M.D.   On: 06/02/2019 09:05     Medications:   . sodium chloride 30 mL (05/18/19 1736)  . ceFTAROline (TEFLARO) IV 200 mg (06/04/2019 0507)  . DAPTOmycin (CUBICIN)  IV 500 mg (05/18/19 2047)  . levETIRAcetam 500 mg (05/18/19 1739)   . sodium chloride   Intravenous Once  . amLODipine  10 mg Oral Daily  . vitamin C  500 mg Oral BID  . atorvastatin  10 mg Oral q1800  . carvedilol  25 mg Oral BID WC  . Chlorhexidine Gluconate Cloth  6 each Topical Daily  . feeding supplement (NEPRO CARB STEADY)  237 mL Oral BID BM  . insulin aspart  0-9 Units Subcutaneous Q4H  . midodrine  5 mg Oral Q M,W,F  . multivitamin  1 tablet Oral QHS  . pantoprazole (PROTONIX) IV  40 mg Intravenous QHS     so far Assessment/ Plan:  [name] is a 74 y.o. black female [name] is a 74  y.o. black female with end stage renal disease on hemodialysis, CVA with left hemiparesis, hypertension, hyperlipidemia, GERD, glaucoma, diabetes mellitus type II, coronary artery disease, gout, seizure disorder who was [facility] for Hypokalemia [E87.6] Shock ([place]) [R57.9] ESRD (end stage renal disease) ([place]) [N18.6] Hypotension, unspecified hypotension type [I95.9] Anemia, unspecified type [D64.9]  [facility] M[facility]  63.5kg  # End Stage Renal Disease: last hemodialysis treatment was Friday 4/2. No  acute indication for dialysis.  Appreciate vascular input -Poor blood flow during dialysis today -Trial of TPA to dialysis catheter    # Hypotension: with MRSA bacteremia/sepsis weaned off vasopressors: Persistent positive blood cultures from 4/7.   Neg results from 4/9 so far - Continue vancomycin and daptomycin. As per ID recommendations from 4/9,  -Awaiting approval for daptomycin from the outpatient dialysis center    #. Anemia with chronic kidney disease: with iron deficiency and folate deficiency. Subcu EPO on 4/7.  Lab Results  Component Value Date   HGB 11.4 (L) 05/13/2019  Status post PRBC transfusion this admission  # Secondary Hyperparathyroidism:  Off binders at present Lab Results  Component Value Date   PTH 232 (H) 05/11/2019   CALCIUM 8.8 (L) 06/06/2019   PHOS 1.3 (L) 05/16/2019    #Malnutrition:   Currently on pureed diet  albumin 2.0 on 05/18/2019     LOS: [place] 4/13/20211:47 PM

## 2019-05-20 NOTE — Progress Notes (Signed)
Pt with eyes opened. Arousable to verbal stimuli. BFR at 300 d/t increased AP. Line flushed with NS 10cc flush with minimal resistance.

## 2019-05-20 NOTE — Progress Notes (Signed)
PT Cancellation Note  Patient Details Name: Crystal Vargas MRN: [id number] DOB: [birth year]   Cancelled Treatment:    Reason Eval/Treat Not Completed: Other (comment)(Pt out of room at this time (HD). PT to follow up as able.)   Lieutenant Diego PT, DPT 11:06 AM,05/15/2019

## 2019-05-20 NOTE — Consult Note (Signed)
Cardiology Consultation Note    Patient ID: Crystal Vargas, MRN: [id number], DOB/AGE: 07/15/1945 74 y.o. Admit date: 05/24/2019   Date of Consult: 05/23/2019 Primary Physician: Marisa Hua, MD Primary Cardiologist: none  Chief Complaint: right foot wound Reason for Consultation: bacteremia for tee Requesting MD: Dr. Delaine Lame  HPI: Crystal Vargas is a 74 y.o. 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  She is unable to give a history.  She lives in a skilled nursing facility.  She dialyzes Monday Wednesday Friday.  She apparently had an abrupt change in her mental status and was found to be hypotensive.  She was brought to the emergency room.  On admission she was profoundly hypokalemic with a potassium of 2.11 hemoglobin of 6.9 platelet count of 82,000 with albumin of 1.3.  She was noted to be MRSA sepsis.Marland Kitchen  She was seen by infectious disease and treated with antibiotics.  Echocardiogram transthoracic revealed EF of 45 to 50% with moderate concentric LVH.  There was no obvious vegetations noted there was mild to moderate MR moderate to severe TR.  Patient is scheduled for dialysis today.  In discussion with her daughter regarding consent for transesophageal echo she relates that she has had some dysphagia recently.  Is unclear whether this is secondary to her CVA or recurrent Schatzki's ring.  At this point, transesophageal echo would be somewhat high risk due to uncertain status of her proximal and mid esophagus.  Would need GI evaluation with direct visualization of her esophagus prior to consideration for TEE.  Past Medical History:  Diagnosis Date  . Chronic kidney disease    RENAL INSUFFICIENCY  . Coronary artery disease   . Depression   . Diabetes mellitus without complication ([place])    . GERD (gastroesophageal reflux disease)   . Glaucoma   . Hyperlipidemia   . Hypertension   . Stroke [facility])       Surgical History:  Past Surgical History:  Procedure Laterality Date  . ABDOMINAL HYSTERECTOMY    . DIALYSIS/PERMA CATHETER INSERTION N/A 05/18/2019   Procedure: DIALYSIS/PERMA CATHETER INSERTION;  Surgeon: Algernon Huxley, MD;  Location: [place] [facility];  Service: Cardiovascular;  Laterality: N/A;  . ESOPHAGOGASTRODUODENOSCOPY (EGD) WITH PROPOFOL N/A 01/21/2018   Procedure: ESOPHAGOGASTRODUODENOSCOPY (EGD) WITH PROPOFOL;  Surgeon: Manya Silvas, MD;  Location: [facility] ENDOSCOPY;  Service: Endoscopy;  Laterality: N/A;  . TEMPORARY DIALYSIS CATHETER N/A 05/16/2019   Procedure: TEMPORARY DIALYSIS CATHETER;  Surgeon: Algernon Huxley, MD;  Location: [facility];  Service: Cardiovascular;  Laterality: N/A;     Home Meds: Prior to Admission medications   Medication Sig Start Date End Date Taking? Authorizing Provider  acetaminophen (TYLENOL) 325 MG tablet Take 650 mg by mouth 3 (three) times daily.    Yes [provider]  albuterol (PROVENTIL) (2.5 MG/3ML) 0.083% nebulizer solution Take 2.5 mg by nebulization 2 (two) times daily as needed for wheezing or shortness of breath.   Yes [provider]  allopurinol (ZYLOPRIM) 100 MG tablet Take 100 mg by mouth every evening.    Yes [provider]  amLODipine (NORVASC) 10 MG tablet Take 10 mg by mouth every evening.    Yes [provider]  atorvastatin (LIPITOR) 10 MG tablet Take 10 mg by mouth every evening.    Yes  [provider]  brimonidine-timolol (COMBIGAN) 0.2-0.5 % ophthalmic solution Place 1 drop into both eyes every 12 (twelve) hours.   Yes [provider]  carvedilol (COREG) 25 MG tablet Take 25 mg by mouth See admin instructions. Take 25 mg twice daily on Sunday, Tuesday, Thursday, Saturday   Yes [provider]  carvedilol (COREG) 25 MG tablet Take 25 mg  by mouth See admin instructions. Take 25 mg in the evening on Monday, Wednesday, Friday   Yes [provider]  cholecalciferol (VITAMIN D3) 25 MCG (1000 UNIT) tablet Take 1,000 Units by mouth daily.   Yes [provider]  clopidogrel (PLAVIX) 75 MG tablet Take 75 mg by mouth every evening.    Yes [provider]  diclofenac Sodium (VOLTAREN) 1 % GEL Apply 2 g topically in the morning and at bedtime. To neck   Yes [provider]  guaiFENesin (ROBITUSSIN) 100 MG/5ML SOLN Take 5 mLs by mouth at bedtime as needed for cough or to loosen phlegm.   Yes [provider]  hydrALAZINE (APRESOLINE) 25 MG tablet Take 25 mg by mouth See admin instructions. Take 25 mg three times daily on Sun, Tue, Thu, Sat, give twice daily on Mon, Wed, Fri after dialysis   Yes [provider]  isosorbide dinitrate (ISORDIL) 10 MG tablet Take 10 mg by mouth 3 (three) times daily.   Yes [provider]  latanoprost (XALATAN) 0.005 % ophthalmic solution Place 1 drop into both eyes every evening.    Yes [provider]  levETIRAcetam (KEPPRA) 1000 MG tablet Take 1,000 mg by mouth every evening.   Yes [provider]  lisinopril (ZESTRIL) 2.5 MG tablet Take 2.5 mg by mouth every evening.    Yes [provider]  melatonin 3 MG TABS tablet Take 3 mg by mouth at bedtime.   Yes [provider]  midodrine (PROAMATINE) 10 MG tablet Take 10 mg by mouth every Monday, Wednesday, and Friday.   Yes [provider]  mirtazapine (REMERON) 7.5 MG tablet Take 7.5 mg by mouth at bedtime.   Yes [provider]  pantoprazole (PROTONIX) 40 MG tablet Take 40 mg by mouth every evening.    Yes [provider]  polyethylene glycol (MIRALAX / GLYCOLAX) 17 g packet Take 17 g by mouth daily.   Yes [provider]  Propylene Glycol-Glycerin (ARTIFICIAL TEARS) 1-0.3 % SOLN Place 2 drops into both eyes in the morning and at  bedtime.   Yes [provider]  sertraline (ZOLOFT) 50 MG tablet Take 50 mg by mouth daily.   Yes [provider]  Wheat Dextrin (BENEFIBER DRINK MIX PO) Take 1 Scoop by mouth daily.   Yes [provider]    Inpatient Medications:  . sodium chloride   Intravenous Once  . amLODipine  10 mg Oral Daily  . atorvastatin  10 mg Oral q1800  . carvedilol  25 mg Oral BID WC  . Chlorhexidine Gluconate Cloth  6 each Topical Daily  . feeding supplement (NEPRO CARB STEADY)  237 mL Oral BID BM  . insulin aspart  0-9 Units Subcutaneous Q4H  . midodrine  5 mg Oral Q M,W,F  . pantoprazole (PROTONIX) IV  40 mg Intravenous QHS   . sodium chloride 30 mL (05/18/19 1736)  . ceFTAROline (TEFLARO) IV 200 mg (06/02/2019 0507)  . DAPTOmycin (CUBICIN)  IV 500 mg (05/18/19 2047)  . levETIRAcetam 500 mg (05/18/19 1739)    Allergies:  Allergies  Allergen  Reactions  . Abacavir Other (See Comments)  . Cephalosporins Other (See Comments)  . Ciprofloxacin Other (See Comments)  . Nsaids Other (See Comments)  . Penicillin G Other (See Comments)  . Quinolones Other (See Comments)  . Salicylates Other (See Comments)    Social History   Socioeconomic History  . Marital status: Single    Spouse name: Not on file  . Number of children: Not on file  . Years of education: Not on file  . Highest education level: Not on file  Occupational History  . Not on file  Tobacco Use  . Smoking status: Never Smoker  . Smokeless tobacco: Never Used  Substance and Sexual Activity  . Alcohol use: Not Currently  . Drug use: Never  . Sexual activity: Not on file  Other Topics Concern  . Not on file  Social History Narrative  . Not on file   Social Determinants of Health   Financial Resource Strain:   . Difficulty of Paying Living Expenses:   Food Insecurity:   . Worried About Charity fundraiser in the Last Year:   . Arboriculturist in the Last Year:   Transportation Needs:   . Lexicographer (Medical):   Marland Kitchen Lack of Transportation (Non-Medical):   Physical Activity:   . Days of Exercise per Week:   . Minutes of Exercise per Session:   Stress:   . Feeling of Stress :   Social Connections:   . Frequency of Communication with Friends and Family:   . Frequency of Social Gatherings with Friends and Family:   . Attends Religious Services:   . Active Member of Clubs or Organizations:   . Attends Archivist Meetings:   Marland Kitchen Marital Status:   Intimate Partner Violence:   . Fear of Current or Ex-Partner:   . Emotionally Abused:   Marland Kitchen Physically Abused:   . Sexually Abused:      History reviewed. No pertinent family history.   Review of Systems: A 12-system review of systems was performed and is negative except as noted in the HPI.  Labs: No results for input(s): CKTOTAL, CKMB, TROPONINI in the last 72 hours. Lab Results  Component Value Date   WBC 9.3 05/09/2019   HGB 6.5 (L) 05/08/2019   HCT 19.6 (L) 05/16/2019   MCV 94.7 05/09/2019   PLT 134 (L) 05/24/2019    Recent Labs  Lab 05/18/2019 0722  NA 148*  K 5.1  CL 118*  CO2 21*  BUN 56*  CREATININE 4.10*  CALCIUM 8.8*  PROT 5.5*  BILITOT 1.7*  ALKPHOS 83  ALT 17  AST 22  GLUCOSE 120*   No results found for: CHOL, HDL, LDLCALC, TRIG No results found for: DDIMER  Radiology/Studies:  CT Head Wo Contrast  Result Date: 05/12/2019 CLINICAL DATA:  Headache, head trauma. Altered mental status EXAM: CT HEAD WITHOUT CONTRAST TECHNIQUE: Contiguous axial images were obtained from the base of the skull through the vertex without intravenous contrast. COMPARISON:  None. FINDINGS: Brain: No acute hemorrhage. No subdural or extra-axial collection. Large chronic right MCA distribution encephalomalacia. Associated ex vacuo dilatation of the right lateral ventricle. Background periventricular chronic small vessel ischemia. No evidence of acute infarct. Vascular: Atherosclerosis of skullbase vasculature without  hyperdense vessel or abnormal calcification. Skull: No fracture or focal lesion. Sinuses/Orbits: Paranasal sinuses and mastoid air cells are clear. The visualized orbits are unremarkable. Other: None. IMPRESSION: 1. No acute intracranial abnormality. 2. Large remote right  MCA distribution infarct with ex vacuo dilatation of the right lateral ventricle. Background chronic small vessel ischemia. Electronically Signed   By: Keith Rake M.D.   On: 06/02/2019 15:09   CT Cervical Spine Wo Contrast  Result Date: 06/06/2019 CLINICAL DATA:  Polytrauma, critical, head/C-spine injury suspected EXAM: CT CERVICAL SPINE WITHOUT CONTRAST TECHNIQUE: Multidetector CT imaging of the cervical spine was performed without intravenous contrast. Multiplanar CT image reconstructions were also generated. COMPARISON:  None. FINDINGS: Alignment: No evidence of traumatic subluxation. Trace anterolisthesis of C6 on C7 is likely degenerative and facet mediated. Skull base and vertebrae: No acute fracture. Vertebral body heights are maintained. The dens and skull base are intact. Soft tissues and spinal canal: No prevertebral fluid or swelling. No visible canal hematoma. Disc levels: Multilevel endplate spurring with mild disc space narrowing at C5-C6 and C6-C7. Multilevel facet hypertrophy. Upper chest: Right internal jugular dialysis catheter in place. Patulous esophagus containing intraluminal fluid/debris. Minimal patchy opacity in the perifissural left upper lobe, partially included. Other: Carotid calcifications. IMPRESSION: 1. Degenerative change in the cervical spine without acute fracture or subluxation. 2. Patulous esophagus containing intraluminal fluid/debris. Electronically Signed   By: Keith Rake M.D.   On: 05/24/2019 15:13   PERIPHERAL VASCULAR CATHETERIZATION  Result Date: 06/05/2019 See op note  PERIPHERAL VASCULAR CATHETERIZATION  Result Date: 06/02/2019 See op note  DG Chest Port 1 View  Result Date:  05/11/2019 CLINICAL DATA:  Respiratory failure EXAM: PORTABLE CHEST 1 VIEW COMPARISON:  May 10, 2019 FINDINGS: Central catheter tip in right atrium slightly beyond cavoatrial junction. No pneumothorax. There is atelectatic change in the medial right base. Lungs elsewhere clear. There is stable cardiac prominence with pulmonary vascularity normal. There is aortic atherosclerosis. No adenopathy. No bone lesions. IMPRESSION: No change in central catheter placement. No pneumothorax. Medial right base atelectasis. Lungs elsewhere clear. Stable cardiac prominence. Aortic Atherosclerosis (ICD10-I70.0). Electronically Signed   By: Lowella Grip III M.D.   On: 05/11/2019 08:36   DG Chest Portable 1 View  Result Date: 06/06/2019 CLINICAL DATA:  Shortness of breath EXAM: PORTABLE CHEST 1 VIEW COMPARISON:  None. FINDINGS: The heart size and mediastinal contours are within normal limits. Large-bore right neck multi lumen vascular catheter. Both lungs are clear. The visualized skeletal structures are unremarkable. IMPRESSION: No acute abnormality of the lungs in AP portable projection. Electronically Signed   By: Eddie Candle M.D.   On: 05/09/2019 15:13   ECHOCARDIOGRAM COMPLETE  Result Date: 05/12/2019    ECHOCARDIOGRAM REPORT   Patient Name:   GIOVANNA Vigna Date of Exam: 05/11/2019 Medical Rec #:  [id number]  Height:       62.0 in Accession #:    [id number] Weight:       136.2 lb Date of Birth:  1945-02-14  BSA:          1.624 m Patient Age:    55 years   BP:           100/63 mmHg Patient Gender: F          HR:           64 bpm. Exam Location:  [facility] Procedure: 2D Echo Indications:     Bacteremia 790.7/ R78.81  History:         Patient has no prior history of Echocardiogram examinations.  Sonographer:     [place] Referring Phys:  [id number] Bradly Bienenstock Diagnosing Phys: Bartholome Bill MD IMPRESSIONS  1. Left ventricular ejection fraction, by estimation, is  45 to 50%. The left ventricle has mildly decreased  function. The left ventricle has no regional wall motion abnormalities. There is moderate concentric left ventricular hypertrophy. Left ventricular diastolic parameters are consistent with Grade I diastolic dysfunction (impaired relaxation).  2. Right ventricular systolic function is normal. The right ventricular size is mildly enlarged. There is moderately elevated pulmonary artery systolic pressure.  3. Left atrial size was mildly dilated.  4. Right atrial size was mildly dilated.  5. The mitral valve was not well visualized. Mild to moderate mitral valve regurgitation.  6. Tricuspid valve regurgitation is moderate to severe.  7. The aortic valve is grossly normal. Aortic valve regurgitation is trivial. No aortic stenosis is present. FINDINGS  Left Ventricle: Left ventricular ejection fraction, by estimation, is 45 to 50%. The left ventricle has mildly decreased function. The left ventricle has no regional wall motion abnormalities. The left ventricular internal cavity size was normal in size. There is moderate concentric left ventricular hypertrophy. Left ventricular diastolic parameters are consistent with Grade I diastolic dysfunction (impaired relaxation). Right Ventricle: The right ventricular size is mildly enlarged. No increase in right ventricular wall thickness. Right ventricular systolic function is normal. There is moderately elevated pulmonary artery systolic pressure. Left Atrium: Left atrial size was mildly dilated. Right Atrium: Right atrial size was mildly dilated. Pericardium: There is no evidence of pericardial effusion. Mitral Valve: The mitral valve was not well visualized. Mild to moderate mitral valve regurgitation. Tricuspid Valve: The tricuspid valve is not well visualized. Tricuspid valve regurgitation is moderate to severe. Aortic Valve: The aortic valve is grossly normal. Aortic valve regurgitation is trivial. No aortic stenosis is present. Aortic valve peak gradient measures 3.5 mmHg.  Pulmonic Valve: The pulmonic valve was not well visualized. Pulmonic valve regurgitation is trivial. Aorta: The aortic root was not well visualized. IAS/Shunts: The interatrial septum was not assessed.  LEFT VENTRICLE PLAX 2D LVIDd:         3.67 cm     Diastology LVIDs:         2.86 cm     LV e' lateral:   6.74 cm/s LV PW:         1.27 cm     LV E/e' lateral: 14.4 LV IVS:        1.31 cm     LV e' medial:    5.44 cm/s LVOT diam:     1.90 cm     LV E/e' medial:  17.8 LV SV:         37 LV SV Index:   23 LVOT Area:     2.84 cm  LV Volumes (MOD) LV vol d, MOD A2C: 63.1 ml LV vol d, MOD A4C: 49.4 ml LV vol s, MOD A2C: 28.0 ml LV vol s, MOD A4C: 26.7 ml LV SV MOD A2C:     35.1 ml LV SV MOD A4C:     49.4 ml LV SV MOD BP:      28.7 ml RIGHT VENTRICLE RV Basal diam:  2.77 cm RV S prime:     7.72 cm/s TAPSE (M-mode): 1.2 cm LEFT ATRIUM             Index       RIGHT ATRIUM           Index LA diam:        3.60 cm 2.22 cm/m  RA Area:     15.60 cm LA Vol (A2C):   49.1 ml 30.24 ml/m RA Volume:  38.80 ml  23.89 ml/m LA Vol (A4C):   57.1 ml 35.16 ml/m LA Biplane Vol: 58.2 ml 35.84 ml/m  AORTIC VALVE                PULMONIC VALVE AV Area (Vmax): 1.93 cm    PV Vmax:       0.62 m/s AV Vmax:        93.50 cm/s  PV Peak grad:  1.6 mmHg AV Peak Grad:   3.5 mmHg LVOT Vmax:      63.50 cm/s LVOT Vmean:     38.700 cm/s LVOT VTI:       0.132 m  AORTA Ao Root diam: 2.50 cm MITRAL VALVE               TRICUSPID VALVE MV Area (PHT): 2.32 cm    TV Peak grad:   36.8 mmHg MV Decel Time: 327 msec    TV Vmax:        3.03 m/s MV E velocity: 97.00 cm/s MV A velocity: 44.60 cm/s  SHUNTS MV E/A ratio:  2.17        Systemic VTI:  0.13 m                            Systemic Diam: 1.90 cm Bartholome Bill MD Electronically signed by Bartholome Bill MD Signature Date/Time: 05/12/2019/7:18:18 AM    Final     Wt Readings from Last 3 Encounters:  05/11/2019 72 kg  04/15/19 50.8 kg  01/21/18 76.2 kg    EKG: nsr with nssttw changes  Physical Exam:  Blood  pressure 98/74, pulse 60, temperature 97.6 F (36.4 C), temperature source Oral, resp. rate 20, height 5\' 2"  (1.575 m), weight 72 kg, SpO2 98 %. Body mass index is 29.03 kg/m. General: Well developed, well nourished, in no acute distress. Head: Normocephalic, atraumatic, sclera non-icteric, no xanthomas, nares are without discharge.  Neck: Negative for carotid bruits. JVD not elevated. Lungs: Clear bilaterally to auscultation without wheezes, rales, or rhonchi. Breathing is unlabored. Heart: RRR with S1 S2. No murmurs, rubs, or gallops appreciated. Abdomen: Soft, non-tender, non-distended with normoactive bowel sounds. No hepatomegaly. No rebound/guarding. No obvious abdominal masses. Msk:  Strength and tone appear normal for age. Extremities: No clubbing or cyanosis. No edema.  Distal pedal pulses are 2+ and equal bilaterally. Neuro: Non communicative     Assessment and Plan   74 y.o. 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  She is unable to give a history.  She lives in a skilled nursing facility.  She dialyzes Monday Wednesday Friday.  She apparently had an abrupt change in her mental status and was found to be hypotensive.  She was brought to the emergency room.  On admission she was profoundly hypokalemic with a potassium of 2.11 hemoglobin of 6.9 platelet count of 82,000 with albumin of 1.3.  She was noted to be MRSA sepsis.Marland Kitchen  She was seen by infectious disease and treated with antibiotics.  Echocardiogram transthoracic revealed EF of 45 to 50% with moderate concentric LVH.  There was no obvious vegetations noted there was mild to moderate MR moderate to severe TR.  Patient is scheduled for dialysis today.  In discussion with her daughter regarding consent for transesophageal echo she  relates that she has had some dysphagia recently.  Is unclear whether this is secondary to her CVA or recurrent Schatzki's ring.  At this point, transesophageal echo would be somewhat high risk due to uncertain status of her proximal and mid esophagus.  Would need GI evaluation with direct visualization of her esophagus prior to consideration for TEE.  Signed, Teodoro Spray MD 05/11/2019, 8:15 AM Pager: [phone]

## 2019-05-20 NOTE — Progress Notes (Signed)
Around 8:18 pt.was noted to be tachypniec at 32 using abdominal breathing. The rest of the vitals were okay. See Flow sheet.. MD was notified and ordered chest x-ray stat. Pt. was transported to dialysis asap to see if this makes a difference. Continue to monitor.

## 2019-05-20 NOTE — Progress Notes (Signed)
Pt. came back from dialysis this afternoon.Pt was still tachypneic. Rest of vitals stable. No new order.

## 2019-05-20 NOTE — Consult Note (Signed)
Crystal Darby, MD [address]  [place]  [place], [place] [address]  Main: [phone]  Fax: [phone] Pager: [phone]   Consultation  Referring Provider:     No ref. provider found Primary Care Physician:  Marisa Hua, MD Primary Gastroenterologist:  Dr. Alice Reichert       Reason for Consultation:     Dysphagia, clearance for TEE  Date of Admission:  06/01/2019 Date of Consultation:  05/13/2019         HPI:   Crystal Vargas is a 74 y.o. 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 Vira Agar.  Patient is admitted on 4/3 from skilled nursing facility due to altered mental status.  She is diagnosed with MRSA bacteremia secondary to infected dialysis catheter.  The dialysis catheter was exchanged.  Patient is currently on vancomycin and daptomycin, blood cultures negative from 4/9.  Transesophageal echocardiogram was recommended by infectious disease specialist to evaluate for infective endocarditis.  Given patient's history of dysphagia and ring in the proximal esophagus, GI is consulted before proceeding with TEE.  Patient is on Plavix as outpatient, which has not been started since admission.  Patient is lethargic and poor historian. She is currently on dysphagia diet.  Patient underwent evaluation by speech pathology in 02/2018 as outpatient, there was evidence of laryngeal penetration of the barium which cleared spontaneously or with a cough. Patient's daughter reported that EGD was originally planned on Thursday last week by Dr. Alice Reichert as outpatient, however, patient got admitted by then  NSAIDs: None  Antiplts/Anticoagulants/Anti thrombotics: Plavix as outpatient  GI Procedures: Upper endoscopy 01/21/2018 moderate Schatzki ring was found in the proximal esophagus and in the distal esophagus. The stomach was normal. The examined  duodenum was normal. After exam of the esophagus showing rings proximal and distal I passed a guidewire and removed the scope and then passed a 38 F savary dilator and a 42 F dilator. Some blood seen on dilator.  Past Medical History:  Diagnosis Date  . Chronic kidney disease    RENAL INSUFFICIENCY  . Coronary artery disease   . Depression   . Diabetes mellitus without complication ([place])   . GERD (gastroesophageal reflux disease)   . Glaucoma   . Hyperlipidemia   . Hypertension   . Stroke [facility])     Past Surgical History:  Procedure Laterality Date  . ABDOMINAL HYSTERECTOMY    . DIALYSIS/PERMA CATHETER INSERTION N/A 06/04/2019   Procedure: DIALYSIS/PERMA CATHETER INSERTION;  Surgeon: Algernon Huxley, MD;  Location: [facility];  Service: Cardiovascular;  Laterality: N/A;  . ESOPHAGOGASTRODUODENOSCOPY (EGD) WITH PROPOFOL N/A 01/21/2018   Procedure: ESOPHAGOGASTRODUODENOSCOPY (EGD) WITH PROPOFOL;  Surgeon: Manya Silvas, MD;  Location: [facility] ENDOSCOPY;  Service: Endoscopy;  Laterality: N/A;  . TEMPORARY DIALYSIS CATHETER N/A 06/04/2019   Procedure: TEMPORARY DIALYSIS CATHETER;  Surgeon: Algernon Huxley, MD;  Location: [facility];  Service: Cardiovascular;  Laterality: N/A;    Prior to Admission medications   Medication Sig Start Date End Date Taking? Authorizing Provider  acetaminophen (TYLENOL) 325 MG tablet Take 650 mg by mouth 3 (three) times daily.    Yes [provider]  albuterol (PROVENTIL) (2.5 MG/3ML) 0.083% nebulizer solution Take 2.5 mg by nebulization 2 (two) times daily as needed for wheezing or shortness of breath.   Yes [provider]  allopurinol (ZYLOPRIM) 100 MG tablet Take 100 mg by mouth every evening.    Yes [provider]  amLODipine (NORVASC) 10 MG tablet Take 10 mg by mouth every evening.    Yes [provider]  atorvastatin (LIPITOR) 10 MG tablet Take 10 mg by mouth every evening.    Yes [provider]  brimonidine-timolol (COMBIGAN) 0.2-0.5 % ophthalmic solution Place 1 drop into both eyes every 12 (twelve) hours.   Yes [provider]  carvedilol (COREG) 25 MG tablet Take 25 mg by mouth See admin instructions. Take 25 mg twice daily on Sunday, Tuesday, Thursday, Saturday   Yes [provider]  carvedilol (COREG) 25 MG tablet Take 25 mg by mouth See admin instructions. Take 25 mg in the evening on Monday, Wednesday, Friday   Yes [provider]  cholecalciferol (VITAMIN D3) 25 MCG (1000 UNIT) tablet Take 1,000 Units by mouth daily.   Yes [provider]  clopidogrel (PLAVIX) 75 MG tablet Take 75 mg by mouth every evening.    Yes [provider]  diclofenac Sodium (VOLTAREN) 1 % GEL Apply 2 g topically in the morning and at bedtime. To neck   Yes [provider]  guaiFENesin (ROBITUSSIN) 100 MG/5ML SOLN Take 5 mLs by mouth at bedtime as needed for cough or to loosen phlegm.   Yes [provider]  hydrALAZINE (APRESOLINE) 25 MG tablet Take 25 mg by mouth See admin instructions. Take 25 mg three times daily on Sun, Tue, Thu, Sat, give twice daily on Mon, Wed, Fri after dialysis   Yes [provider]  isosorbide dinitrate (ISORDIL) 10 MG tablet Take 10 mg by mouth 3 (three) times daily.   Yes [provider]  latanoprost (XALATAN) 0.005 % ophthalmic solution Place 1 drop into both eyes every evening.    Yes [provider]  levETIRAcetam (KEPPRA) 1000 MG tablet Take 1,000 mg by mouth every evening.   Yes [provider]  lisinopril (ZESTRIL) 2.5 MG tablet Take 2.5 mg by mouth every evening.    Yes [provider]  melatonin 3 MG TABS tablet Take 3 mg by mouth at bedtime.   Yes [provider]  midodrine (PROAMATINE) 10 MG tablet Take 10 mg by mouth every Monday, Wednesday, and Friday.   Yes [provider]  mirtazapine (REMERON) 7.5 MG tablet Take 7.5 mg by mouth at bedtime.    Yes [provider]  pantoprazole (PROTONIX) 40 MG tablet Take 40 mg by mouth every evening.    Yes [provider]  polyethylene glycol (MIRALAX / GLYCOLAX) 17 g packet Take 17 g by mouth daily.   Yes [provider]  Propylene Glycol-Glycerin (ARTIFICIAL TEARS) 1-0.3 % SOLN Place 2 drops into both eyes in the morning and at bedtime.   Yes [provider]  sertraline (ZOLOFT) 50 MG tablet Take 50 mg by mouth daily.   Yes [provider]  Wheat Dextrin (BENEFIBER DRINK MIX PO) Take 1 Scoop by mouth daily.   Yes [provider]   Current Facility-Administered Medications:  .  0.9 %  sodium chloride infusion (Manually program via Guardrails IV Fluids), , Intravenous, Once, Dew, Erskine Squibb, MD .  0.9 %  sodium chloride infusion, , Intravenous, PRN, Algernon Huxley, MD, Last Rate: 10 mL/hr at 05/18/19 1736, 30 mL at 05/18/19 1736 .  amLODipine (NORVASC) tablet 10 mg, 10 mg, Oral, Daily, Dew, Erskine Squibb, MD, 10 mg at 05/28/2019 1547 .  ascorbic acid (VITAMIN C) tablet 500 mg, 500 mg, Oral, BID, Wyvonnia Dusky, MD .  atorvastatin (LIPITOR) tablet 10 mg, 10 mg, Oral, q1800, Algernon Huxley, MD, 10 mg at 06/04/2019 1754 .  carvedilol (COREG) tablet 25 mg, 25 mg, Oral, BID WC, Dew, Erskine Squibb, MD, 25 mg at 05/14/2019 1547 .  ceftaroline (TEFLARO) 200 mg in sodium chloride 0.9 % 250 mL IVPB, 200 mg, Intravenous, Q8H, Dew, Erskine Squibb, MD, Last Rate: 250 mL/hr at 05/23/2019 1546, 200 mg at 05/09/2019 1546 .  Chlorhexidine Gluconate Cloth 2 % PADS 6 each, 6 each, Topical, Daily, Dew, Erskine Squibb, MD, 6 each at 06/01/2019 0929 .  DAPTOmycin (CUBICIN) 500 mg in sodium chloride 0.9 % IVPB, 500 mg, Intravenous, Q48H, Dew, Erskine Squibb, MD, Last Rate: 220 mL/hr at 05/18/19 2047, 500 mg at 05/18/19 2047 .  feeding supplement (NEPRO CARB STEADY) liquid 237 mL, 237 mL, Oral, BID BM, Dew, Jason S, MD .  insulin aspart (novoLOG) injection 0-9 Units, 0-9 Units, Subcutaneous, Q4H, Algernon Huxley, MD, 1  Units at 05/18/2019 0504 .  levETIRAcetam (KEPPRA) IVPB 500 mg/100 mL premix, 500 mg, Intravenous, q1800, Algernon Huxley, MD, Last Rate: 400 mL/hr at 06/06/2019 1753, 500 mg at 05/11/2019 1753 .  midodrine (PROAMATINE) tablet 5 mg, 5 mg, Oral, Q M,W,F, Dew, Erskine Squibb, MD, 5 mg at 06/04/2019 0928 .  morphine 2 MG/ML injection 1-2 mg, 1-2 mg, Intravenous, Q4H PRN, Algernon Huxley, MD, 2 mg at 05/16/19 0337 .  multivitamin (RENA-VIT) tablet 1 tablet, 1 tablet, Oral, QHS, Wyvonnia Dusky, MD .  pantoprazole (PROTONIX) injection 40 mg, 40 mg, Intravenous, QHS, Algernon Huxley, MD, 40 mg at 05/17/2019 2237   History reviewed. No pertinent family history.   Social History   Tobacco Use  . Smoking status: Never Smoker  . Smokeless tobacco: Never Used  Substance Use Topics  . Alcohol use: Not Currently  . Drug use: Never    Allergies as of 05/24/2019 - Review Complete 05/30/2019  Allergen Reaction Noted  . Abacavir Other (See Comments) 01/18/2018  . Cephalosporins Other (See Comments) 01/18/2018  . Ciprofloxacin Other (See Comments) 01/18/2018  . Nsaids Other (See Comments) 01/18/2018  . Penicillin g Other (See Comments) 01/18/2018  . Quinolones Other (See Comments) 01/18/2018  . Salicylates Other (See Comments) 01/18/2018    Review of Systems:    All systems reviewed and negative except where noted in HPI.   Physical Exam:  Vital signs in last 24 hours: Temp:  [97 F (36.1 C)-98.7 F (37.1 C)] 97 F (36.1 C) (04/13 0907) Pulse Rate:  [57-109] 62 (04/13 1230) Resp:  [18-32] 30 (04/13 1230) BP: (92-179)/(53-108) 148/85 (04/13 1230) SpO2:  [93 %-100 %] 93 % (04/13 1215) Last BM Date: 05/18/19 General:   Lethargic, not in distress, nonverbal Head:  Normocephalic and atraumatic. Eyes:   No icterus.   Conjunctiva pink. PERRLA. Ears:  Normal auditory acuity. Neck:  Supple; no masses or thyroidomegaly Lungs: Respirations even and unlabored. Lungs clear to auscultation bilaterally.   No wheezes,  crackles, or rhonchi.  Heart:  Regular rate and rhythm;  Without murmur, clicks, rubs or gallops Abdomen:  Soft, nondistended, nontender. Normal bowel sounds. No appreciable masses or hepatomegaly.  No rebound or guarding.  Rectal:  Not performed. Msk:  Symmetrical without gross deformities. Extremities:  Without edema, cyanosis or clubbing. Neurologic:  Alert and oriented x0; Skin:  Intact without significant lesions or rashes.  LAB RESULTS: CBC Latest Ref  Rng & Units 05/08/2019 05/18/2019 05/18/2019  WBC 4.0 - 10.5 K/uL 7.8 9.3 9.5  Hemoglobin 12.0 - 15.0 g/dL 11.4(L) 6.5(L) 7.1(L)  Hematocrit 36.0 - 46.0 % 34.2(L) 19.6(L) 20.7(L)  Platelets 150 - 400 K/uL 138(L) 134(L) 110(L)    BMET BMP Latest Ref Rng & Units 05/24/2019 05/14/2019 05/18/2019  Glucose 70 - 99 mg/dL 120(H) 135(H) 101(H)  BUN 8 - 23 mg/dL 56(H) 47(H) 43(H)  Creatinine 0.44 - 1.00 mg/dL 4.10(H) 3.66(H) 3.24(H)  Sodium 135 - 145 mmol/L 148(H) 145 145  Potassium 3.5 - 5.1 mmol/L 5.1 3.8 3.5  Chloride 98 - 111 mmol/L 118(H) 111 111  CO2 22 - 32 mmol/L 21(L) 24 25  Calcium 8.9 - 10.3 mg/dL 8.8(L) 8.7(L) 8.8(L)    LFT Hepatic Function Latest Ref Rng & Units 06/04/2019 05/31/2019 05/18/2019  Total Protein 6.5 - 8.1 g/dL 5.5(L) 5.0(L) 5.0(L)  Albumin 3.5 - 5.0 g/dL 1.9(L) 1.8(L) 2.0(L)  AST 15 - 41 U/L 22 21 18   ALT 0 - 44 U/L 17 18 17   Alk Phosphatase 38 - 126 U/L 83 77 72  Total Bilirubin 0.3 - 1.2 mg/dL 1.7(H) 1.7(H) 1.5(H)     STUDIES: PERIPHERAL VASCULAR CATHETERIZATION  Result Date: 05/23/2019 See op note  DG Chest Port 1 View  Result Date: 06/05/2019 CLINICAL DATA:  Tachypnea.  Renal failure EXAM: PORTABLE CHEST 1 VIEW COMPARISON:  May 11, 2019. FINDINGS: Central catheter tip is in the right atrium. No pneumothorax. There is cardiomegaly with pulmonary venous hypertension. There are small pleural effusions bilaterally. There is interstitial thickening throughout the lungs as well as patchy areas of airspace  opacity, primarily in the perihilar regions. There is aortic atherosclerosis.  No evident bone lesions. IMPRESSION: Central catheter tip in right atrium. Cardiomegaly with pulmonary vascular congestion and small pleural effusions. Findings most likely due to interstitial and alveolar edema. Overall appearance consistent with congestive heart failure. Areas of superimposed pneumonia cannot be excluded radiographically. Aortic Atherosclerosis (ICD10-I70.0). Electronically Signed   By: Lowella Grip III M.D.   On: 06/06/2019 09:05      Impression / Plan:   Crystal Vargas is a 74 y.o. female with ESRD on hemodialysis, CHF EF of 30 to 50% is admitted with MRSA bacteremia secondary to infected HD catheter, known history of esophageal ring s/p dilation in 2019  Dysphagia, esophageal rings We will perform upper endoscopy to evaluate for proximal esophageal ring and dilation tomorrow Discussed my recommendation with patient's daughter, Ms. Robin Cicero who agreed with the plan in order to proceed with a transesophageal echocardiogram after upper endoscopy Continue dysphagia diet N.p.o. past midnight Continue PPI daily  Thank you for involving me in the care of this patient.      LOS: 10 days   Sherri Sear, MD  05/09/2019, 7:00 PM   Note: This dictation was prepared with Dragon dictation along with smaller phrase technology. Any transcriptional errors that result from this process are unintentional.

## 2019-05-20 NOTE — Progress Notes (Addendum)
Nutrition Follow Up Note   DOCUMENTATION CODES:   Not applicable  INTERVENTION:   Recommend NGT and nutrition support  Nepro Shake po BID, each supplement provides 425 kcal and 19 grams protein  Magic cup TID with meals, each supplement provides 290 kcal and 9 grams of protein  Rena-vite daily   Vitamin C 586m po BID  If tube feeds initiated, recommend:  Osmolite 1.5 @50ml /hr- Initiate at 242mhr and increase by 1074mr q 8 hours until goal rate is reached  Prostat liquid protein 30 ml daily via tube, each supplement provides 100 kcal, 15 grams protein.  Free water flushes 23m61m hours to maintain tube patency   Regimen provides 1900kcal/day, 90g/day protein and 1094ml37m free water  Pt at high refeed risk; recommend monitor K, Mg and P labs daily after tube feed initiation.   NUTRITION DIAGNOSIS:   Inadequate oral intake related to poor appetite, dysphagia as evidenced by meal completion < 50%.  GOAL:   Patient will meet greater than or equal to 90% of their needs  -not met   MONITOR:   PO intake, Supplement acceptance, Weight trends, Labs, I & O's, Diet advancement, Skin  ASSESSMENT:   Patient with PMH significant for ESRD on HD, dysphagia due to Schatzki's ring with past dilations, CAD, DM, GERD, HLD, and HTN. Presents this admission with MRSA bacteremia.   Pt s/p placement of a 23 cm tip to cuff tunneled hemodialysis catheter 4/12  Pt with poor appetite and oral intake; pt eating 10% of meals in hospital and has now been without adequate nutrition for > 7 days. Pt lethargic today and required emergent dialysis this morning. Pt on a dysphagia 1/nectar thick diet; SLP to re-evaluate today. If family wishes to continue with full scope of treatment; recommend nasogastric tube placement with nutrition support. Pt is at high refeed risk. MD discussed tube with family yesterday; family to make a decision about tube today.    Per chart, pt up ~15lbs since admit.    Medications reviewed and include: insulin, protonix, ceftaroline, daptomycin   Labs reviewed: Na 148(H), BUN 56(H), creat 4.10(H)  Diet Order:   Diet Order            Diet renal/carb modified with fluid restriction Diet-HS Snack? Nothing; Fluid restriction: 1200 mL Fluid; Room service appropriate? Yes; Fluid consistency: Thin  Diet effective now             EDUCATION NEEDS:   Not appropriate for education at this time  Skin:  Skin Assessment: Reviewed RN Assessment(L heel 3.5cm x 2.5cm x 0cm) Skin Integrity Issues:: Unstageable, Other (Comment) Unstageable: L heel Other: L toe  Last BM:  4/11  Height:   Ht Readings from Last 1 Encounters:  05/09/2019 5' 2"  (1.575 m)    Weight:   Wt Readings from Last 1 Encounters:  05/17/2019 72 kg    BMI:  Body mass index is 29.03 kg/m.  Estimated Nutritional Needs:   Kcal:  1600-1800kcal/day  Protein:  80-90g/day  Fluid:  UOP +1L  CaseyKoleen DistanceRD, LDN Please refer to AMIONToms River Ambulatory Surgical CenterRD and/or RD on-call/weekend/after hours pager

## 2019-05-20 NOTE — Progress Notes (Signed)
Speech Language Pathology Treatment: Dysphagia  Patient Details Name: Crystal Vargas MRN: [id number] DOB: [birth year] Today's Date: 06/05/2019 Time: 1620-1700 SLP Time Calculation (min) (ACUTE ONLY): 40 min  Assessment / Plan / Recommendation Clinical Impression  Pt seen for ongoing assessment of swallowing; toleration of po diet w/ trials to upgrade diet as appropriate and safe for pt. NSG reported good toleration of current dysphagia diet but w/ only minimal amounts of po's taken at one time -- pt demonstrated such during the BSE. Dtr stated at BSE pt required a "feeding tube last time when she didn't eat enough". Pt awake this morning, smiled at SLP. Pt was verbal w/ SLP but w/ low volume of speech; quiet. Pt more verbal w/ SLP today; speech stronger, clearer. Pt positioned upright in bed for po's -- needed full support w/ this. Pt did not attempt to hold the cup; she stated she could not -- suspect she is totally dependent w/ feeding. Pt consumed trials of ice chips then sips of thin liquids via tsp then straw w/ no overt, clinical s/s of aspiration noted; timely munching and oral phase control w/ ice chips. With the trials of thin liquids, pt exhibited adequate oral prep attention and focus to take Single, Small sips - No impulsive drinking behavior. No change in vocal quality or overall respiratory status during/post trials. No coughing w/ trials. Pt only takes small amounts of po's at one time -- seems Guarded about wanting to open mouth to accept TSP amounts. Total feeding support.  Pt appears to tolerate thin liquids for upgrade to Dysphagia level 1 w/ thin liquids diet. Pt has a h/o Esophageal phase dysmotility as well -- see chart notes; has a h/o Not eating well requiring a "feeding tube" per Dtr's report during previous CVA. Pt appears to have Cognitive decline and requires verbal cues and total support w/ oral intake. Recommend continue aspiration precautions; Ice Chips for Pleasure w/  aspiration precautions and NSG Supervision; feeding support w/ all oral intake/meals. Pills in Puree Crushed for ease of swallowing and Esophageal clearing. ST services will continue to monitor pt's status for trials to upgrade the food consistency in diet when appropriate. Recommend continue Dietician f/u for supplement support; Palliative Care consult for [place]. Noted discussion about poor oral intake overall and concern for need for alternative means of feeding(PEG). NSG updated.     HPI HPI: Pt is a 74 year old lifelong never smoker who has been followed by [facility] nephrology and initiated dialysis in January.  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  She has chronic issues with dysphagia due to Schatzki's ring and has been evaluated by GI and has had esophageal dilatation in the past.  Daughter also stated pt had a "Feeding Tube after her stroke because she wasn't eating enough".  The patient cannot provide reliable history due to altered mental status.  Daughter who is present with her states that she has had poor p.o. intake due to dysphagia.  She currently resides in a facility in [place] due to needing skilled nursing care.  Pt had been declining x2 weeks, reported by speech therapy at [facility] where pt [facility].  Pt was admitted w/ septic shock and MRSA Bacteremia; MD noted Severe protein calorie malnutrition.  A Palliative Care consult is ordered for [place].       SLP Plan  Continue with current plan of care       Recommendations  Diet recommendations: Dysphagia 1 (puree);Thin liquid Liquids provided via: Cup;Straw(monitor) Medication Administration: Crushed with puree(for safer swallowing) Supervision: Full supervision/cueing for compensatory strategies;Trained caregiver to feed patient Compensations: Minimize environmental distractions;Slow rate;Small sips/bites;Lingual sweep for clearance of  pocketing;Multiple dry swallows after each bite/sip;Follow solids with liquid Postural Changes and/or Swallow Maneuvers: Seated upright 90 degrees;Upright 30-60 min after meal                General recommendations: (Dietician following; palliative care for [place]) Oral Care Recommendations: Oral care BID;Oral care before and after PO;Staff/trained caregiver to provide oral care Follow up Recommendations: Skilled Nursing facility SLP Visit Diagnosis: Dysphagia, oropharyngeal phase (R13.12)(Cognitive decline; prior CVA) Plan: Continue with current plan of care       [place], Marydel, CCC-SLP Fany Cavanaugh 05/16/2019, 5:16 PM

## 2019-05-20 NOTE — Consult Note (Signed)
Pharmacy Antibiotic Note  Crystal Vargas is a 74 y.o. female admitted on 05/17/2019 with MRSA bacteremia due to an infected HD catheter.  Pharmacy has been consulted for daptomycin.  Patient receives hemodialysis on Monday, Wednesday and Friday. Dr Lucky Cowboy placed a PermCath 4/12. TEE is pending GI clearance. ID recommends a minimum of 6 weeks of IV antibiotics pending TEE results  Plan: Daptomycin: Continue daptomycin 500 mg (~8 mg/kg) IV q48 hours CK once weekly: next 4/14 (baseline 10 - 17 U/L)  Pharmacy will continue to dose and adjust per consult.   Height: 5\' 2"  (157.5 cm) Weight: 72 kg (158 lb 11.7 oz) IBW/kg (Calculated) : 50.1  Temp (24hrs), Avg:98.5 F (36.9 C), Min:97 F (36.1 C), Max:99.2 F (37.3 C)  Recent Labs  Lab 05/13/19 1420 05/14/19 1051 06/05/2019 3612 05/17/2019 2449 05/17/2019 0757 05/16/19 0327 05/17/19 0423 05/18/19 0751 06/02/2019 0624 06/06/2019 0722  WBC  --    < >  --   --  7.7 9.5 9.2 9.5 9.3  --   CREATININE  --    < > 3.76*   < >  --  2.16* 2.66* 3.24* 3.66* 4.10*  VANCOTROUGH 32*  --   --   --   --   --   --   --   --   --   VANCORANDOM  --   --  25  --   --   --   --   --   --   --    < > = values in this interval not displayed.    Estimated Creatinine Clearance: 10.9 mL/min (A) (by C-G formula based on SCr of 4.1 mg/dL (H)).    Allergies  Allergen Reactions  . Abacavir Other (See Comments)  . Cephalosporins Other (See Comments)  . Ciprofloxacin Other (See Comments)  . Nsaids Other (See Comments)  . Penicillin G Other (See Comments)  . Quinolones Other (See Comments)  . Salicylates Other (See Comments)    Antimicrobials this admission: vancomycin 4/3 >> 4/8 aztreonam 4/3 x 1 daptomycin 4/7 >> ceftaroline 4/12 >>  Microbiology results: 4/9 BCx: NGTD 4/7 BCx: S aureus 4/7 Cath Tip: no growth final  4/6 Bcx: GPC 1/4 (anaerobic bottle) 4/4 Bcx:  Staph aureus (A-line) 4/5 Bcx:  Staph aureus 4/3 BCx: MRSA 4/4 Cath tip:  MRSA 4/3 UCx: Kleb -  Likely contamination  4/3 MRSA PCR: negative 4/3 Flu A&B, Covid (nasopharyngeal swab):  negative  Thank you for allowing pharmacy to be a part of this patient's care.  Dallie Piles 06/02/2019 9:32 AM

## 2019-05-20 NOTE — Progress Notes (Addendum)
Daily Progress Note   Patient Name: Crystal Vargas       Date: 05/17/2019 DOB: 12-02-1945  Age: 74 y.o. MRN#: [id number] Attending Physician: Wyvonnia Dusky, MD Primary Care Physician: Marisa Hua, MD Admit Date: 05/28/2019  Reason for Consultation/Follow-up: Establishing goals of care  Subjective: Patient is resting in bed with cover pulled up over most of her face with one eye and part of her nose visible. She says "eh" when asked questions. No family at bedside.   Spoke with daughter Laurine Blazer. Discussed her mother having the cover pulled up over most of her head; she states her mother must be feeling better as she does this sometimes. She states she has been kept well updated. She states her mother has had a feeding tube in the past, and they are okay with the placement of one for nutrition this hospitalization. She discusses the wound specialist consult for her feet. She tells me she is waiting to find out if the esophageal procedure will be done to then enable a TEE. She is pleased with all care and would like to continue aggressive care.     Length of Stay: 10  Current Medications: Scheduled Meds:  . sodium chloride   Intravenous Once  . amLODipine  10 mg Oral Daily  . vitamin C  500 mg Oral BID  . atorvastatin  10 mg Oral q1800  . carvedilol  25 mg Oral BID WC  . Chlorhexidine Gluconate Cloth  6 each Topical Daily  . feeding supplement (NEPRO CARB STEADY)  237 mL Oral BID BM  . insulin aspart  0-9 Units Subcutaneous Q4H  . midodrine  5 mg Oral Q M,W,F  . multivitamin  1 tablet Oral QHS  . pantoprazole (PROTONIX) IV  40 mg Intravenous QHS    Continuous Infusions: . sodium chloride 30 mL (05/18/19 1736)  . ceFTAROline (TEFLARO) IV 200 mg (05/20/2019 0507)  . DAPTOmycin  (CUBICIN)  IV 500 mg (05/18/19 2047)  . levETIRAcetam 500 mg (05/18/19 1739)    PRN Meds: sodium chloride, morphine injection  Physical Exam Pulmonary:     Effort: Pulmonary effort is normal.  Neurological:     Mental Status: She is alert.             Vital Signs: BP (!) 148/85   Pulse 62  Temp (!) 97 F (36.1 C) (Oral)   Resp (!) 30   Ht 5\' 2"  (1.575 m)   Wt 72 kg   SpO2 93%   BMI 29.03 kg/m  SpO2: SpO2: 93 % O2 Device: O2 Device: Nasal Cannula O2 Flow Rate: O2 Flow Rate (L/min): 3 L/min  Intake/output summary:   Intake/Output Summary (Last 24 hours) at 06/01/2019 1523 Last data filed at 05/12/2019 1225 Gross per 24 hour  Intake 700 ml  Output 1300 ml  Net -600 ml   LBM: Last BM Date: 05/18/19 Baseline Weight: Weight: 65.2 kg Most recent weight: Weight: 72 kg       Palliative Assessment/Data:    Flowsheet Rows     Most Recent Value  Intake Tab  Referral Department  Hospitalist  Unit at Time of Referral  ICU  Palliative Care Primary Diagnosis  Neurology  Date Notified  05/11/19  Palliative Care Type  New Palliative care  Reason for referral  Clarify Goals of Care  Date of Admission  06/06/2019  Date first seen by Palliative Care  05/12/19  # of days Palliative referral response time  1 Day(s)  # of days IP prior to Palliative referral  1  Clinical Assessment  Psychosocial & Spiritual Assessment  Palliative Care Outcomes      Patient Active Problem List   Diagnosis Date Noted  . MRSA bacteremia 05/11/2019  . Status post CVA 05/11/2019  . Schatzki's ring 05/11/2019  . Shock ([place]) 05/14/2019  . Diabetes ([place]) 04/15/2019  . Essential hypertension 04/15/2019  . ESRD on dialysis [facility]) 04/15/2019    Palliative Care Assessment & Plan    Recommendations/Plan: Goals set. Continue full code/full scope.  Palliative will sign off. Please re-consult if needed.      Code Status:    Code Status Orders  (From admission, onward)         Start      Ordered   05/18/2019 1615  Full code  Continuous     06/02/2019 1621        Code Status History    This patient has a current code status but no historical code status.   Advance Care Planning Activity      Prognosis: Poor overall.    Thank you for allowing the Palliative Medicine Team to assist in the care of this patient.   Total Time 15 min Prolonged Time Billed  no      Greater than 50%  of this time was spent counseling and coordinating care related to the above assessment and plan.  Asencion Gowda, NP  Please contact Palliative Medicine Team phone at [phone] for questions and concerns.

## 2019-05-20 NOTE — TOC Progression Note (Signed)
Transition of Care [facility]) - Progression Note    Patient Details  Name: Crystal Vargas MRN: [id number] Date of Birth: [birth year]  [facility] of [facility]) CM/SW Contact  Beverly Sessions, RN Phone Number: 05/14/2019, 2:06 PM  Clinical Narrative:     Per MD patient may require Doboff tube.  Will hold off starting insurance auth till patient is more medically stable   Expected Discharge Plan: [place] Barriers to Discharge: Continued Medical Work up  Expected Discharge Plan and Services Expected Discharge Plan: [place] arrangements for the past 2 months: [place]                                       Social Determinants of Health (SDOH) Interventions    Readmission Risk Interventions No flowsheet data found.

## 2019-05-20 NOTE — Progress Notes (Signed)
PROGRESS NOTE    Crystal Vargas  PPJ:[id number] DOB: 1945/08/20 DOA: 05/09/2019 PCP: Marisa Hua, MD       Assessment & Plan:   Principal Problem:   MRSA bacteremia Active Problems:   Diabetes ([place])   Essential hypertension   ESRD on dialysis ([place])   Shock ([place])   Status post CVA   Schatzki's ring   MRSA bacteremia: secondary infected HD catheter which has been removed w/ MRSA bacteremia. S/p placement of temporary HD catheter. Continue on IV daptomycin, ceftaroline x 6 weeks minimimum as per ID (so at least 34 days left, but may change depending on ID recs). Blood cx 05/14/19 are still growing MRSA. Repeat blood cx 05/16/19 NGTD. ID following and recs apprec. Cardio needs to have GI eval w/ direct visualization of pt's esophagus prior to consideration for TEE. GI consulted  Septic shock: secondary to above. Continue on IV abxs. Resolved  ESRD: on HD. Temp catheter removed secondary to recurrent MRSA bacteremia. Will place permcath today as per vasc surg. HD as per nephro  Thrombocytopenia: etiology unclear, possibly secondary to infection stated above. 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 S/p 2 units of PRBCs.  Will continue to monitor   Severe protein calorie malnutrition: w/ poor po intake due to chronic issues w/ dysphagia. Continue on nutrional supplements. Dietitian is recommending dobhoff tube & with tube feeds. Pt's daughter, [name], would like to proceed w/ the feeding tube & tube feeds  Hypernatremia: free water deficit 0.7L. Encourage free water intake   Left heel wound: wound care recs apprec   Weakness: w/ left hemiparesis. PT recs SNF. OT consulted   DM2: will continue on SSI w/ accuchecks  HLD: will continue on statin   HTN: will continue carvedilol, amlodipine   GERD: continue on PPI   CAD: will continue on carvedilol, amlodipine & statin.    DVT prophylaxis: SCDs Code Status:  Full  Family Communication: discussed pt's care w/  pt's daughter, [name], and answered her questions  Disposition Plan: PT recs SNF, will likely d/c to SNF    Consultants:   ID  Vascular surgery  ICU  nephro  Cardio  GI   Procedures:    Antimicrobials: daptomycin    Subjective: Pt c/o fatigue    Objective: Vitals:   06/06/2019 1200 05/24/2019 1215 05/23/2019 1225 05/08/2019 1230  BP: 133/89 140/66 131/76 (!) 148/85  Pulse: 63 64 63 62  Resp: (!) 29 (!) 28 (!) 31 (!) 30  Temp:      TempSrc:      SpO2: 93% 93%    Weight:      Height:        Intake/Output Summary (Last 24 hours) at 05/17/2019 1416 Last data filed at 05/12/2019 1225 Gross per 24 hour  Intake 700 ml  Output 1300 ml  Net -600 ml   Filed Weights   05/17/19 0500 05/18/19 0451 05/19/2019 1608  Weight: 80.1 kg 72 kg 72 kg    Examination:  General exam: Appears calm and comfortable. Frail appearing. Appears older than stated age  Respiratory system: diminished breath sounds b/l . No rales  Cardiovascular system: S1 & S2 +. No rubs, gallops or clicks.  Gastrointestinal system: Abdomen is nondistended, soft and nontender. normal bowel sounds heard. Central nervous system: Lethargic. Moves all 4 extremities  Psychiatry: Judgement and insight appear abnormal. Flat mood and affect    Data Reviewed: I have personally reviewed following labs and imaging studies  CBC:  Recent Labs  Lab 05/16/19 0327 05/17/19 0423 05/18/19 0751 05/19/2019 0624 06/05/2019 0928  WBC 9.5 9.2 9.5 9.3 7.8  HGB 8.3* 7.8* 7.1* 6.5* 11.4*  HCT 23.7* 23.1* 20.7* 19.6* 34.2*  MCV 90.5 94.3 93.7 94.7 90.0  PLT 70* 48* 110* 134* 161*   Basic Metabolic Panel: Recent Labs  Lab 05/14/19 1051 06/05/2019 0633 05/16/19 0327 05/17/19 0423 05/18/19 0751 05/14/2019 0624 06/02/2019 0722  NA 143   < > 142 142 145 145 148*  K 4.3   < > 3.5 3.5 3.5 3.8 5.1  CL 112*   < > 108 110 111 111 118*  CO2 16*   < > 26 24 25 24  21*  GLUCOSE 123*   < > 269* 96 101* 135* 120*  BUN 44*   < > 24* 34* 43*  47* 56*  CREATININE 3.54*   < > 2.16* 2.66* 3.24* 3.66* 4.10*  CALCIUM 9.4   < > 8.8* 8.7* 8.8* 8.7* 8.8*  PHOS 3.1  --  1.3*  --   --   --   --    < > = values in this interval not displayed.   GFR: Estimated Creatinine Clearance: 10.9 mL/min (A) (by C-G formula based on SCr of 4.1 mg/dL (H)). Liver Function Tests: Recent Labs  Lab 05/16/19 0327 05/17/19 0423 05/18/19 0751 05/14/2019 0624 06/05/2019 0722  AST  --  22 18 21 22   ALT  --  19 17 18 17   ALKPHOS  --  78 72 77 83  BILITOT  --  1.7* 1.5* 1.7* 1.7*  PROT  --  4.9* 5.0* 5.0* 5.5*  ALBUMIN 2.3* 2.1* 2.0* 1.8* 1.9*   No results for input(s): LIPASE, AMYLASE in the last 168 hours. No results for input(s): AMMONIA in the last 168 hours. Coagulation Profile: No results for input(s): INR, PROTIME in the last 168 hours. Cardiac Enzymes: Recent Labs  Lab 05/17/2019 0633  CKTOTAL 10*   BNP (last 3 results) No results for input(s): PROBNP in the last 8760 hours. HbA1C: No results for input(s): HGBA1C in the last 72 hours. CBG: Recent Labs  Lab 05/13/2019 1747 05/14/2019 1954 05/17/2019 2353 05/24/2019 0354 05/13/2019 1331  GLUCAP 124* 146* 108* 133* 118*   Lipid Profile: No results for input(s): CHOL, HDL, LDLCALC, TRIG, CHOLHDL, LDLDIRECT in the last 72 hours. Thyroid Function Tests: No results for input(s): TSH, T4TOTAL, FREET4, T3FREE, THYROIDAB in the last 72 hours. Anemia Panel: No results for input(s): VITAMINB12, FOLATE, FERRITIN, TIBC, IRON, RETICCTPCT in the last 72 hours. Sepsis Labs: No results for input(s): PROCALCITON, LATICACIDVEN in the last 168 hours.  Recent Results (from the past 240 hour(s))  MRSA PCR Screening     Status: None   Collection Time: 05/24/2019  6:27 PM   Specimen: Nasopharyngeal  Result Value Ref Range Status   MRSA by PCR NEGATIVE NEGATIVE Final    Comment:        The GeneXpert MRSA Assay (FDA approved for NASAL specimens only), is one component of a comprehensive MRSA  colonization surveillance program. It is not intended to diagnose MRSA infection nor to guide or monitor treatment for MRSA infections. Performed at [facility], [address] [place], [place] [address]   Urine culture     Status: Abnormal   Collection Time: 06/01/2019 10:30 PM   Specimen: Urine, Clean Catch  Result Value Ref Range Status   Specimen Description   Final    URINE, CLEAN CATCH Performed at [facility], [address]., [place], [place] [address]    Special Requests   Final    NONE Performed at [facility], [place], [place] [address]    Culture (A)  Final    >=100,000 COLONIES/mL KLEBSIELLA PNEUMONIAE >=100,000 COLONIES/mL VANCOMYCIN RESISTANT ENTEROCOCCUS    Report Status 05/14/2019 FINAL  Final   Organism ID, Bacteria KLEBSIELLA PNEUMONIAE (A)  Final   Organism ID, Bacteria VANCOMYCIN RESISTANT ENTEROCOCCUS (A)  Final      Susceptibility   Klebsiella pneumoniae - MIC*    AMPICILLIN >=32 RESISTANT Resistant     CEFAZOLIN <=4 SENSITIVE Sensitive     CEFTRIAXONE <=0.25 SENSITIVE Sensitive     CIPROFLOXACIN <=0.25 SENSITIVE Sensitive     GENTAMICIN <=1 SENSITIVE Sensitive     IMIPENEM 0.5 SENSITIVE Sensitive     NITROFURANTOIN 64 INTERMEDIATE Intermediate     TRIMETH/SULFA <=20 SENSITIVE Sensitive     AMPICILLIN/SULBACTAM 4 SENSITIVE Sensitive     PIP/TAZO <=4 SENSITIVE Sensitive     * >=100,000 COLONIES/mL KLEBSIELLA PNEUMONIAE   Vancomycin resistant enterococcus - MIC*    AMPICILLIN >=32 RESISTANT Resistant     NITROFURANTOIN 256 RESISTANT Resistant     VANCOMYCIN >=32 RESISTANT Resistant     LINEZOLID 2 SENSITIVE Sensitive     * >=100,000 COLONIES/mL VANCOMYCIN RESISTANT ENTEROCOCCUS  Cath Tip Culture     Status: Abnormal   Collection Time: 05/11/19  1:10 PM   Specimen: Catheter Tip; Other  Result Value Ref Range Status   Specimen Description   Final    CATH TIP Performed at [facility], [address] [place], [place] [address]    Special Requests   Final    NONE Performed at [facility], [address] [place], [place] [address]    Culture (A)  Final    >=100,000 COLONIES/mL METHICILLIN RESISTANT STAPHYLOCOCCUS AUREUS   Report Status 05/13/2019 FINAL  Final   Organism ID, Bacteria METHICILLIN RESISTANT STAPHYLOCOCCUS AUREUS (A)  Final      Susceptibility   Methicillin resistant staphylococcus aureus - MIC*    CIPROFLOXACIN >=8 RESISTANT Resistant     ERYTHROMYCIN >=8 RESISTANT Resistant     GENTAMICIN <=0.5 SENSITIVE Sensitive     OXACILLIN >=4 RESISTANT Resistant     TETRACYCLINE <=1 SENSITIVE Sensitive     VANCOMYCIN 1 SENSITIVE Sensitive     TRIMETH/SULFA >=320 RESISTANT Resistant     CLINDAMYCIN <=0.25 SENSITIVE Sensitive     RIFAMPIN <=0.5 SENSITIVE Sensitive     Inducible Clindamycin NEGATIVE Sensitive     * >=100,000 COLONIES/mL METHICILLIN RESISTANT STAPHYLOCOCCUS AUREUS  Culture, blood (routine x 2)     Status: Abnormal   Collection Time: 05/11/19  4:27 PM   Specimen: BLOOD  Result Value Ref Range Status   Specimen Description   Final    BLOOD A-LINE Performed at [facility], [address], [place], [place] [address]    Special Requests   Final    BOTTLES DRAWN AEROBIC AND ANAEROBIC Blood Culture adequate volume Performed at [facility], [address] [place], [place] [address]    Culture  Setup Time   Final    GRAM POSITIVE COCCI ANAEROBIC BOTTLE ONLY CRITICAL VALUE NOTED.  VALUE IS CONSISTENT WITH PREVIOUSLY REPORTED AND CALLED VALUE. Performed at [facility], [address] [place], [place] [address]    Culture (A)  Final    STAPHYLOCOCCUS AUREUS SUSCEPTIBILITIES PERFORMED ON PREVIOUS CULTURE WITHIN THE LAST 5 DAYS.  Performed at [facility], [place] [address], [place], [place] [address]    Report Status 05/12/2019 FINAL  Final  Culture, blood (routine x 2)     Status: Abnormal   Collection  Time: 05/12/19  6:06 AM   Specimen: BLOOD  Result Value Ref Range Status   Specimen Description   Final    BLOOD BLOOD RIGHT HAND Performed at [facility], [address], [place], [place] [address]    Special Requests   Final    BOTTLES DRAWN AEROBIC AND ANAEROBIC Blood Culture adequate volume Performed at [facility], [address] [place], [place] [address]    Culture  Setup Time   Final    GRAM POSITIVE COCCI ANAEROBIC BOTTLE ONLY CRITICAL RESULT CALLED TO, READ BACK BY AND VERIFIED WITH: [place] ON 05/12/2019 BY MOSLEY,J Performed at [facility], [place] [place], [place] [address]    Culture METHICILLIN RESISTANT STAPHYLOCOCCUS AUREUS (A)  Final   Report Status 05/30/2019 FINAL  Final   Organism ID, Bacteria METHICILLIN RESISTANT STAPHYLOCOCCUS AUREUS  Final      Susceptibility   Methicillin resistant staphylococcus aureus - MIC*    CIPROFLOXACIN >=8 RESISTANT Resistant     ERYTHROMYCIN >=8 RESISTANT Resistant     GENTAMICIN <=0.5 SENSITIVE Sensitive     OXACILLIN >=4 RESISTANT Resistant     TETRACYCLINE <=1 SENSITIVE Sensitive     VANCOMYCIN 1 SENSITIVE Sensitive     TRIMETH/SULFA >=320 RESISTANT Resistant     CLINDAMYCIN <=0.25 SENSITIVE Sensitive     RIFAMPIN <=0.5 SENSITIVE Sensitive     Inducible Clindamycin NEGATIVE Sensitive     * METHICILLIN RESISTANT STAPHYLOCOCCUS AUREUS  CULTURE, BLOOD (ROUTINE X 2) w Reflex to ID Panel     Status: Abnormal   Collection Time: 05/13/19  2:20 PM   Specimen: BLOOD  Result Value Ref Range Status   Specimen Description   Final    BLOOD BLOOD RIGHT HAND Performed at [facility], [address] [place], [place] [address]    Special Requests   Final    BOTTLES DRAWN AEROBIC AND ANAEROBIC Blood Culture adequate volume Performed at [facility], [address] [place], [place] [address]    Culture  Setup Time   Final    GRAM POSITIVE COCCI ANAEROBIC  BOTTLE ONLY CRITICAL RESULT CALLED TO, READ BACK BY AND VERIFIED WITH: [facility] KATSOUDAS AT 0347 05/14/19 SDR Performed at [facility], [address] [place], [place] [address]    Culture (A)  Final    STAPHYLOCOCCUS AUREUS SUSCEPTIBILITIES PERFORMED ON PREVIOUS CULTURE WITHIN THE LAST 5 DAYS. Performed at [facility], [place] [address] [place], [place] [address]    Report Status 05/16/2019 FINAL  Final  CULTURE, BLOOD (ROUTINE X 2) w Reflex to ID Panel     Status: None   Collection Time: 05/13/19  3:39 PM   Specimen: BLOOD  Result Value Ref Range Status   Specimen Description BLOOD BLOOD LEFT HAND  Final   Special Requests   Final    BOTTLES DRAWN AEROBIC ONLY Blood Culture adequate volume   Culture   Final    NO GROWTH 5 DAYS Performed at [facility], [address] [place],  [address]    Report Status 05/18/2019 FINAL  Final  CULTURE, BLOOD (ROUTINE X 2) w Reflex to ID Panel     Status: Abnormal (Preliminary result)   Collection Time: 05/14/19  3:33  PM   Specimen: BLOOD  Result Value Ref Range Status   Specimen Description   Final    BLOOD BLOOD RIGHT HAND Performed at [facility], [address], [place], [place] [address]    Special Requests   Final    BOTTLES DRAWN AEROBIC AND ANAEROBIC Blood Culture adequate volume Performed at [facility], [address] [place], [place] [address]    Culture  Setup Time   Final    GRAM POSITIVE COCCI IN CLUSTERS ANAEROBIC BOTTLE ONLY CRITICAL VALUE NOTED.  VALUE IS CONSISTENT WITH PREVIOUSLY REPORTED AND CALLED VALUE.    Culture (A)  Final    STAPHYLOCOCCUS AUREUS Sent to [place] for further susceptibility testing. Performed at [facility], [place] [address] [place], [place] [address]    Report Status PENDING  Incomplete  Cath Tip Culture     Status: None   Collection Time: 05/14/19  4:24 PM   Specimen: Catheter Tip; Other  Result Value Ref Range Status    Specimen Description   Final    CATH TIP Performed at [facility], [address] [place], [place] [address]    Special Requests   Final    NONE Performed at [facility], [address] [place], [place] [address]    Culture   Final    NO GROWTH 3 DAYS Performed at [facility], [place] [address], [place], [place] [address]    Report Status 05/17/2019 FINAL  Final  CULTURE, BLOOD (ROUTINE X 2) w Reflex to ID Panel     Status: Abnormal   Collection Time: 05/14/19  6:25 PM   Specimen: BLOOD  Result Value Ref Range Status   Specimen Description   Final    BLOOD BLOOD LEFT HAND Performed at [facility], [address] [place], [place] [address]    Special Requests   Final    BOTTLES DRAWN AEROBIC AND ANAEROBIC Blood Culture results may not be optimal due to an inadequate volume of blood received in culture bottles Performed at [facility], [address] [place], [place] [address]    Culture  Setup Time   Final    GRAM POSITIVE COCCI ANAEROBIC BOTTLE ONLY CRITICAL VALUE NOTED.  VALUE IS CONSISTENT WITH PREVIOUSLY REPORTED AND CALLED VALUE. [place] Performed at [facility], [address] [place], [place] [address]    Culture (A)  Final    STAPHYLOCOCCUS AUREUS SUSCEPTIBILITIES PERFORMED ON PREVIOUS CULTURE WITHIN THE LAST 5 DAYS. Performed at [facility], [place] [address] [place], [place] [address]    Report Status 05/17/2019 FINAL  Final  Culture, blood (Routine X 2) w Reflex to ID Panel     Status: None (Preliminary result)   Collection Time: 05/16/19 10:45 AM   Specimen: BLOOD  Result Value Ref Range Status   Specimen Description BLOOD RIGHT HAND  Final   Special Requests BLOOD Blood Culture adequate volume  Final   Culture   Final    NO GROWTH 4 DAYS Performed at [facility], [address] [place], [place] [address]    Report Status PENDING  Incomplete  Culture, blood (Routine X 2) w Reflex  to ID Panel     Status: None (Preliminary result)   Collection Time: 05/16/19 12:38 PM   Specimen: BLOOD  Result Value Ref Range Status   Specimen Description BLOOD RT HAND  Final   Special Requests   Final    BOTTLES  DRAWN AEROBIC AND ANAEROBIC Blood Culture adequate volume   Culture   Final    NO GROWTH 4 DAYS Performed at [facility], [address] [place], [place] [address]    Report Status PENDING  Incomplete         Radiology Studies: PERIPHERAL VASCULAR CATHETERIZATION  Result Date: 05/28/2019 See op note  DG Chest Port 1 View  Result Date: 05/16/2019 CLINICAL DATA:  Tachypnea.  Renal failure EXAM: PORTABLE CHEST 1 VIEW COMPARISON:  May 11, 2019. FINDINGS: Central catheter tip is in the right atrium. No pneumothorax. There is cardiomegaly with pulmonary venous hypertension. There are small pleural effusions bilaterally. There is interstitial thickening throughout the lungs as well as patchy areas of airspace opacity, primarily in the perihilar regions. There is aortic atherosclerosis.  No evident bone lesions. IMPRESSION: Central catheter tip in right atrium. Cardiomegaly with pulmonary vascular congestion and small pleural effusions. Findings most likely due to interstitial and alveolar edema. Overall appearance consistent with congestive heart failure. Areas of superimposed pneumonia cannot be excluded radiographically. Aortic Atherosclerosis (ICD10-I70.0). Electronically Signed   By: Lowella Grip III M.D.   On: 06/02/2019 09:05        Scheduled Meds: . sodium chloride   Intravenous Once  . amLODipine  10 mg Oral Daily  . vitamin C  500 mg Oral BID  . atorvastatin  10 mg Oral q1800  . carvedilol  25 mg Oral BID WC  . Chlorhexidine Gluconate Cloth  6 each Topical Daily  . feeding supplement (NEPRO CARB STEADY)  237 mL Oral BID BM  . insulin aspart  0-9 Units Subcutaneous Q4H  . midodrine  5 mg Oral Q M,W,F  . multivitamin  1 tablet Oral QHS  .  pantoprazole (PROTONIX) IV  40 mg Intravenous QHS   Continuous Infusions: . sodium chloride 30 mL (05/18/19 1736)  . ceFTAROline (TEFLARO) IV 200 mg (05/22/2019 0507)  . DAPTOmycin (CUBICIN)  IV 500 mg (05/18/19 2047)  . levETIRAcetam 500 mg (05/18/19 1739)     LOS: 10 days    Time spent: 30 mins     Wyvonnia Dusky, MD [facility] Pager [phone]  If 7PM-7AM, please contact night-coverage [email or web address] 05/25/2019, 2:16 PM

## 2019-05-21 ENCOUNTER — Encounter: Payer: Self-pay | Admitting: Internal Medicine

## 2019-05-21 ENCOUNTER — Inpatient Hospital Stay: Payer: Medicare Other

## 2019-05-21 ENCOUNTER — Encounter: Admission: EM | Disposition: E | Payer: Self-pay | Source: Home / Self Care | Attending: Internal Medicine

## 2019-05-21 ENCOUNTER — Inpatient Hospital Stay: Payer: Medicare Other | Admitting: Anesthesiology

## 2019-05-21 DIAGNOSIS — R7881 Bacteremia: Secondary | ICD-10-CM | POA: Diagnosis not present

## 2019-05-21 DIAGNOSIS — N186 End stage renal disease: Secondary | ICD-10-CM | POA: Diagnosis not present

## 2019-05-21 DIAGNOSIS — R5383 Other fatigue: Secondary | ICD-10-CM

## 2019-05-21 DIAGNOSIS — R1310 Dysphagia, unspecified: Secondary | ICD-10-CM

## 2019-05-21 DIAGNOSIS — E876 Hypokalemia: Secondary | ICD-10-CM

## 2019-05-21 DIAGNOSIS — I10 Essential (primary) hypertension: Secondary | ICD-10-CM | POA: Diagnosis not present

## 2019-05-21 DIAGNOSIS — K222 Esophageal obstruction: Secondary | ICD-10-CM | POA: Diagnosis not present

## 2019-05-21 DIAGNOSIS — J9600 Acute respiratory failure, unspecified whether with hypoxia or hypercapnia: Secondary | ICD-10-CM

## 2019-05-21 DIAGNOSIS — J9601 Acute respiratory failure with hypoxia: Secondary | ICD-10-CM | POA: Diagnosis not present

## 2019-05-21 DIAGNOSIS — B9562 Methicillin resistant Staphylococcus aureus infection as the cause of diseases classified elsewhere: Secondary | ICD-10-CM | POA: Diagnosis not present

## 2019-05-21 DIAGNOSIS — T827XXA Infection and inflammatory reaction due to other cardiac and vascular devices, implants and grafts, initial encounter: Secondary | ICD-10-CM | POA: Diagnosis not present

## 2019-05-21 HISTORY — PX: ESOPHAGOGASTRODUODENOSCOPY: SHX5428

## 2019-05-21 LAB — CK: Total CK: 16 U/L — ABNORMAL LOW (ref 38–234)

## 2019-05-21 LAB — GLUCOSE, CAPILLARY
Glucose-Capillary: 104 mg/dL — ABNORMAL HIGH (ref 70–99)
Glucose-Capillary: 112 mg/dL — ABNORMAL HIGH (ref 70–99)
Glucose-Capillary: 114 mg/dL — ABNORMAL HIGH (ref 70–99)
Glucose-Capillary: 115 mg/dL — ABNORMAL HIGH (ref 70–99)
Glucose-Capillary: 118 mg/dL — ABNORMAL HIGH (ref 70–99)
Glucose-Capillary: 138 mg/dL — ABNORMAL HIGH (ref 70–99)

## 2019-05-21 LAB — COMPREHENSIVE METABOLIC PANEL
ALT: 16 U/L (ref 0–44)
AST: 18 U/L (ref 15–41)
Albumin: 2 g/dL — ABNORMAL LOW (ref 3.5–5.0)
Alkaline Phosphatase: 82 U/L (ref 38–126)
Anion gap: 9 (ref 5–15)
BUN: 35 mg/dL — ABNORMAL HIGH (ref 8–23)
CO2: 25 mmol/L (ref 22–32)
Calcium: 8.2 mg/dL — ABNORMAL LOW (ref 8.9–10.3)
Chloride: 111 mmol/L (ref 98–111)
Creatinine, Ser: 2.92 mg/dL — ABNORMAL HIGH (ref 0.44–1.00)
GFR calc Af Amer: 17 mL/min — ABNORMAL LOW (ref >60)
GFR calc non Af Amer: 15 mL/min — ABNORMAL LOW (ref >60)
Glucose, Bld: 135 mg/dL — ABNORMAL HIGH (ref 70–99)
Potassium: 3.6 mmol/L (ref 3.5–5.1)
Sodium: 145 mmol/L (ref 135–145)
Total Bilirubin: 1.6 mg/dL — ABNORMAL HIGH (ref 0.3–1.2)
Total Protein: 5.5 g/dL — ABNORMAL LOW (ref 6.5–8.1)

## 2019-05-21 LAB — CBC
HCT: 30 % — ABNORMAL LOW (ref 36.0–46.0)
Hemoglobin: 10.1 g/dL — ABNORMAL LOW (ref 12.0–15.0)
MCH: 30.4 pg (ref 26.0–34.0)
MCHC: 33.7 g/dL (ref 30.0–36.0)
MCV: 90.4 fL (ref 80.0–100.0)
Platelets: 137 10*3/uL — ABNORMAL LOW (ref 150–400)
RBC: 3.32 MIL/uL — ABNORMAL LOW (ref 3.87–5.11)
RDW: 19.4 % — ABNORMAL HIGH (ref 11.5–15.5)
WBC: 9.2 10*3/uL (ref 4.0–10.5)
nRBC: 0 % (ref 0.0–0.2)

## 2019-05-21 LAB — CULTURE, BLOOD (ROUTINE X 2)
Culture: NO GROWTH
Culture: NO GROWTH
Special Requests: ADEQUATE
Special Requests: ADEQUATE

## 2019-05-21 LAB — POTASSIUM: Potassium: 3.6 mmol/L (ref 3.5–5.1)

## 2019-05-21 SURGERY — EGD (ESOPHAGOGASTRODUODENOSCOPY)
Anesthesia: General

## 2019-05-21 MED ORDER — SODIUM CHLORIDE 0.9 % IV SOLN: INTRAVENOUS | Status: DC

## 2019-05-21 MED ORDER — LIDOCAINE HCL (CARDIAC) PF 100 MG/5ML IV SOSY
PREFILLED_SYRINGE | INTRAVENOUS | Status: DC | PRN
  Administered 2019-05-21: 60 mg via INTRAVENOUS

## 2019-05-21 MED ORDER — LATANOPROST 0.005 % OP SOLN
1.0000 [drp] | Freq: Every evening | OPHTHALMIC | Status: DC
  Administered 2019-05-21 – 2019-06-02 (×10): 1 [drp] via OPHTHALMIC
  Filled 2019-05-21 (×2): qty 2.5

## 2019-05-21 MED ORDER — ALLOPURINOL 100 MG PO TABS
100.0000 mg | ORAL_TABLET | Freq: Every evening | ORAL | Status: DC
  Administered 2019-05-21 – 2019-06-02 (×11): 100 mg via ORAL
  Filled 2019-05-21 (×12): qty 1

## 2019-05-21 MED ORDER — MIRTAZAPINE 15 MG PO TABS: 7.5000 mg | ORAL_TABLET | Freq: Every day | ORAL | Status: DC

## 2019-05-21 MED ORDER — PROPOFOL 10 MG/ML IV BOLUS
INTRAVENOUS | Status: DC | PRN
  Administered 2019-05-21: 40 mg via INTRAVENOUS
  Administered 2019-05-21 (×2): 20 mg via INTRAVENOUS

## 2019-05-21 MED ORDER — BRIMONIDINE TARTRATE 0.2 % OP SOLN
1.0000 [drp] | Freq: Two times a day (BID) | OPHTHALMIC | Status: DC
  Administered 2019-05-21 – 2019-06-03 (×23): 1 [drp] via OPHTHALMIC
  Filled 2019-05-21 (×2): qty 5

## 2019-05-21 MED ORDER — PANTOPRAZOLE SODIUM 40 MG PO TBEC: 40.0000 mg | DELAYED_RELEASE_TABLET | Freq: Every evening | ORAL | Status: DC

## 2019-05-21 MED ORDER — MIRTAZAPINE 15 MG PO TABS
7.5000 mg | ORAL_TABLET | Freq: Every day | ORAL | Status: DC
  Administered 2019-05-21 – 2019-05-23 (×3): 7.5 mg via ORAL
  Administered 2019-05-24: 15 mg via ORAL
  Administered 2019-05-25 – 2019-05-30 (×5): 7.5 mg via ORAL
  Filled 2019-05-21 (×10): qty 1

## 2019-05-21 MED ORDER — SERTRALINE HCL 50 MG PO TABS: 50.0000 mg | ORAL_TABLET | Freq: Every day | ORAL | Status: DC

## 2019-05-21 MED ORDER — BRIMONIDINE TARTRATE-TIMOLOL 0.2-0.5 % OP SOLN: 1.0000 [drp] | Freq: Two times a day (BID) | OPHTHALMIC | Status: DC

## 2019-05-21 MED ORDER — TIMOLOL MALEATE 0.5 % OP SOLN
1.0000 [drp] | Freq: Two times a day (BID) | OPHTHALMIC | Status: DC
  Administered 2019-05-21 – 2019-06-03 (×23): 1 [drp] via OPHTHALMIC
  Filled 2019-05-21 (×2): qty 5

## 2019-05-21 MED ORDER — LEVETIRACETAM 500 MG PO TABS
1000.0000 mg | ORAL_TABLET | Freq: Every evening | ORAL | Status: DC
  Administered 2019-05-21 – 2019-06-02 (×11): 1000 mg via ORAL
  Filled 2019-05-21 (×13): qty 2

## 2019-05-21 MED ORDER — SERTRALINE HCL 50 MG PO TABS
50.0000 mg | ORAL_TABLET | Freq: Every day | ORAL | Status: DC
  Administered 2019-05-22 – 2019-06-03 (×10): 50 mg via ORAL
  Filled 2019-05-21 (×11): qty 1

## 2019-05-21 NOTE — Progress Notes (Signed)
PROGRESS NOTE    Crystal Vargas   EHU:[id number]  DOB: 08-11-1945  DOA: 06/02/2019 PCP: Marisa Hua, MD   Brief Narrative:  Crystal Vargas is a 74 y.o. female who is bed bound and from SNF with history of end-stage renal disease recently started on hemodialysis, history of right MCA stroke, history of dysphagia due to Schatzki's ring and is status post esophageal dilatation who was admitted with altered mental status.  Her daughter noted poor PO intake thought to be due to dysphagia.  In the ED, she was hypotensive and had a K of 2.1 and a Hb of 6.9.  She was given IVF, IV Abx and admitted to the ICU.   Subjective: NO complaints.     Assessment & Plan:   Principal Problem:   MRSA bacteremia/ septic shock - due to infected dialysis cath - appreciate ID assistance- currently on Daptomycin and Teflaro - will need TEE  Active Problems: Dysphagia- chronic issue - EGD performed today revealed and esophageal stenosis that was dilated - regular diet ordered- OK to proceed with TEE for above  ESRD - infected cath removed and tunnelled HD cath placed on 4/12 -cont dialysis per renal team  H/o HTN -cont Coreg with holding parameters  Acute anemia (possible related to infection/ acute illness) in addition to CKD - has been transfused 2 U PRBC so far    Diabetes Mellitus 2 - cont SSI - A1c checked on 4/3 was 5.6   Status post CVA  - bed bound  Seizure disorder - cont Keppra-    Mild thrombocytopenia - possible due to sepsis- follow  Time spent in minutes: 35 DVT prophylaxis: SCDs Code Status: Full code Family Communication:  Disposition Plan: return to SNF eventually - currently on IV Abx for bacteremia- needs TEE Consultants:  Nephro CArdiology GI ID Palliative care Procedures:   EGD Antimicrobials:  Anti-infectives (From admission, onward)   Start     Dose/Rate Route Frequency Ordered Stop   05/16/2019 1645  clindamycin (CLEOCIN) IVPB 300 mg  Status:   Discontinued     300 mg 100 mL/hr over 30 Minutes Intravenous  Once 05/24/2019 1642 05/30/2019 1804   05/13/2019 1645  clindamycin (CLEOCIN) 300 MG/50ML IVPB    Note to Pharmacy: Genelle Bal   : cabinet override      05/16/2019 1645 05/09/2019 0459   05/25/2019 1600  ceftaroline (TEFLARO) 200 mg in sodium chloride 0.9 % 250 mL IVPB     200 mg 250 mL/hr over 60 Minutes Intravenous Every 8 hours 05/23/2019 1328     05/31/2019 1200  vancomycin (VANCOREADY) IVPB 750 mg/150 mL     750 mg 150 mL/hr over 60 Minutes Intravenous Every Thu (Hemodialysis) 05/23/2019 1126 06/02/2019 1809   05/14/19 2000  DAPTOmycin (CUBICIN) 500 mg in sodium chloride 0.9 % IVPB     500 mg 220 mL/hr over 30 Minutes Intravenous Every 48 hours 05/14/19 1550     05/12/19 1645  vancomycin (VANCOREADY) IVPB 750 mg/150 mL     750 mg 150 mL/hr over 60 Minutes Intravenous  Once 05/12/19 1635 05/12/19 1819   05/12/19 1358  vancomycin variable dose per unstable renal function (pharmacist dosing)  Status:  Discontinued      Does not apply See admin instructions 05/12/19 1358 05/09/2019 1120   05/12/19 1200  vancomycin (VANCOREADY) IVPB 500 mg/100 mL  Status:  Discontinued     500 mg 100 mL/hr over 60 Minutes Intravenous Every M-W-F (Hemodialysis) 05/09/2019 2016 05/22/2019 2018  05/12/19 1200  vancomycin (VANCOREADY) IVPB 750 mg/150 mL  Status:  Discontinued     750 mg 150 mL/hr over 60 Minutes Intravenous Every M-W-F (Hemodialysis) 06/01/2019 2018 05/12/19 1358   05/09/2019 2100  aztreonam (AZACTAM) 1 g in sodium chloride 0.9 % 100 mL IVPB  Status:  Discontinued     1 g 200 mL/hr over 30 Minutes Intravenous Every 24 hours 05/24/2019 2016 05/11/19 1019   05/14/2019 1500  aztreonam (AZACTAM) injection 2 g  Status:  Discontinued     2 g Intramuscular Once 06/06/2019 1411 05/16/2019 1442   05/08/2019 1445  aztreonam (AZACTAM) 2 g in sodium chloride 0.9 % 100 mL IVPB  Status:  Discontinued     2 g 200 mL/hr over 30 Minutes Intravenous  Once 05/09/2019 1442 05/23/2019  2016   06/01/2019 1430  vancomycin (VANCOREADY) IVPB 1250 mg/250 mL     1,250 mg 166.7 mL/hr over 90 Minutes Intravenous  Once 05/30/2019 1411 06/05/2019 1635       Objective: Vitals:   05/25/2019 1729 05/14/2019 1739 05/18/2019 1749 05/23/2019 1820  BP: (!) 109/55   133/71  Pulse:    61  Resp:  (!) 29 (!) 30 (!) 30  Temp: 97.7 F (36.5 C)   98.4 F (36.9 C)  TempSrc: Tympanic   Axillary  SpO2:    95%  Weight:      Height:        Intake/Output Summary (Last 24 hours) at 05/14/2019 1834 Last data filed at 05/19/2019 1725 Gross per 24 hour  Intake 1420 ml  Output 0 ml  Net 1420 ml   Filed Weights   05/17/19 0500 05/18/19 0451 05/22/2019 1608  Weight: 80.1 kg 72 kg 72 kg    Examination: General exam: Appears comfortable  HEENT: PERRLA, oral mucosa moist, no sclera icterus or thrush Respiratory system: Clear to auscultation. Respiratory effort normal. Cardiovascular system: S1 & S2 heard, RRR.   Gastrointestinal system: Abdomen soft, non-tender, nondistended. Normal bowel sounds. Central nervous system: Alert and oriented. No focal neurological deficits. Extremities: No cyanosis, clubbing or edema Skin: No rashes or ulcers Psychiatry:  Mood & affect appropriate.     Data Reviewed: I have personally reviewed following labs and imaging studies  CBC: Recent Labs  Lab 05/17/19 0423 05/18/19 0751 06/05/2019 0624 05/17/2019 0928 06/04/2019 0640  WBC 9.2 9.5 9.3 7.8 9.2  HGB 7.8* 7.1* 6.5* 11.4* 10.1*  HCT 23.1* 20.7* 19.6* 34.2* 30.0*  MCV 94.3 93.7 94.7 90.0 90.4  PLT 48* 110* 134* 138* 737*   Basic Metabolic Panel: Recent Labs  Lab 05/16/19 0327 05/16/19 0327 05/17/19 0423 05/17/19 0423 05/18/19 0751 05/31/2019 0624 05/23/2019 0722 05/25/2019 0640 05/11/2019 1540  NA 142   < > 142  --  145 145 148* 145  --   K 3.5   < > 3.5   < > 3.5 3.8 5.1 3.6 3.6  CL 108   < > 110  --  111 111 118* 111  --   CO2 26   < > 24  --  25 24 21* 25  --   GLUCOSE 269*   < > 96  --  101* 135* 120*  135*  --   BUN 24*   < > 34*  --  43* 47* 56* 35*  --   CREATININE 2.16*   < > 2.66*  --  3.24* 3.66* 4.10* 2.92*  --   CALCIUM 8.8*   < > 8.7*  --  8.8* 8.7*  8.8* 8.2*  --   PHOS 1.3*  --   --   --   --   --   --   --   --    < > = values in this interval not displayed.   GFR: Estimated Creatinine Clearance: 15.2 mL/min (A) (by C-G formula based on SCr of 2.92 mg/dL (H)). Liver Function Tests: Recent Labs  Lab 05/17/19 0423 05/18/19 0751 05/09/2019 0624 06/05/2019 0722 05/24/2019 0640  AST 22 18 21 22 18   ALT 19 17 18 17 16   ALKPHOS 78 72 77 83 82  BILITOT 1.7* 1.5* 1.7* 1.7* 1.6*  PROT 4.9* 5.0* 5.0* 5.5* 5.5*  ALBUMIN 2.1* 2.0* 1.8* 1.9* 2.0*   No results for input(s): LIPASE, AMYLASE in the last 168 hours. No results for input(s): AMMONIA in the last 168 hours. Coagulation Profile: No results for input(s): INR, PROTIME in the last 168 hours. Cardiac Enzymes: Recent Labs  Lab 05/21/2019 0633 05/22/2019 0640  CKTOTAL 10* 16*   BNP (last 3 results) No results for input(s): PROBNP in the last 8760 hours. HbA1C: No results for input(s): HGBA1C in the last 72 hours. CBG: Recent Labs  Lab 05/16/2019 0012 05/14/2019 0428 06/01/2019 0811 05/11/2019 1325 05/31/2019 1813  GLUCAP 138* 112* 118* 104* 114*   Lipid Profile: No results for input(s): CHOL, HDL, LDLCALC, TRIG, CHOLHDL, LDLDIRECT in the last 72 hours. Thyroid Function Tests: No results for input(s): TSH, T4TOTAL, FREET4, T3FREE, THYROIDAB in the last 72 hours. Anemia Panel: No results for input(s): VITAMINB12, FOLATE, FERRITIN, TIBC, IRON, RETICCTPCT in the last 72 hours. Urine analysis:    Component Value Date/Time   COLORURINE YELLOW (A) 05/23/2019 2230   APPEARANCEUR TURBID (A) 05/17/2019 2230   LABSPEC 1.023 05/24/2019 2230   PHURINE 5.0 05/12/2019 2230   GLUCOSEU NEGATIVE 05/13/2019 2230   HGBUR SMALL (A) 06/02/2019 2230   BILIRUBINUR NEGATIVE 05/26/2019 2230   KETONESUR NEGATIVE 05/25/2019 2230   PROTEINUR 100  (A) 05/12/2019 2230   NITRITE NEGATIVE 05/09/2019 2230   LEUKOCYTESUR MODERATE (A) 06/04/2019 2230   Sepsis Labs: @LABRCNTIP (procalcitonin:4,lacticidven:4) ) Recent Results (from the past 240 hour(s))  Culture, blood (routine x 2)     Status: Abnormal   Collection Time: 05/12/19  6:06 AM   Specimen: BLOOD  Result Value Ref Range Status   Specimen Description   Final    BLOOD BLOOD RIGHT HAND Performed at [facility], [address], [place], [place] [address]    Special Requests   Final    BOTTLES DRAWN AEROBIC AND ANAEROBIC Blood Culture adequate volume Performed at [facility], [address], [place], [place] [address]    Culture  Setup Time   Final    GRAM POSITIVE COCCI ANAEROBIC BOTTLE ONLY CRITICAL RESULT CALLED TO, READ BACK BY AND VERIFIED WITH: [place] ON 05/12/2019 BY MOSLEY,J Performed at [facility], [address] [place], [place] [address]    Culture METHICILLIN RESISTANT STAPHYLOCOCCUS AUREUS (A)  Final   Report Status 05/25/2019 FINAL  Final   Organism ID, Bacteria METHICILLIN RESISTANT STAPHYLOCOCCUS AUREUS  Final      Susceptibility   Methicillin resistant staphylococcus aureus - MIC*    CIPROFLOXACIN >=8 RESISTANT Resistant     ERYTHROMYCIN >=8 RESISTANT Resistant     GENTAMICIN <=0.5 SENSITIVE Sensitive     OXACILLIN >=4 RESISTANT Resistant     TETRACYCLINE <=1 SENSITIVE Sensitive     VANCOMYCIN 1 SENSITIVE Sensitive     TRIMETH/SULFA >=320  RESISTANT Resistant     CLINDAMYCIN <=0.25 SENSITIVE Sensitive     RIFAMPIN <=0.5 SENSITIVE Sensitive     Inducible Clindamycin NEGATIVE Sensitive     * METHICILLIN RESISTANT STAPHYLOCOCCUS AUREUS  CULTURE, BLOOD (ROUTINE X 2) w Reflex to ID Panel     Status: Abnormal   Collection Time: 05/13/19  2:20 PM   Specimen: BLOOD  Result Value Ref Range Status   Specimen Description   Final    BLOOD BLOOD RIGHT HAND Performed at [facility], [address]  [place], [place] [address]    Special Requests   Final    BOTTLES DRAWN AEROBIC AND ANAEROBIC Blood Culture adequate volume Performed at [facility], [address], [place], [place] [address]    Culture  Setup Time   Final    GRAM POSITIVE COCCI ANAEROBIC BOTTLE ONLY CRITICAL RESULT CALLED TO, READ BACK BY AND VERIFIED WITH: [facility] KATSOUDAS AT 6720 05/14/19 SDR Performed at [facility], [address] [place], [place] [address]    Culture (A)  Final    STAPHYLOCOCCUS AUREUS SUSCEPTIBILITIES PERFORMED ON PREVIOUS CULTURE WITHIN THE LAST 5 DAYS. Performed at [facility], [place] [address] [place], [place] [address]    Report Status 05/16/2019 FINAL  Final  CULTURE, BLOOD (ROUTINE X 2) w Reflex to ID Panel     Status: None   Collection Time: 05/13/19  3:39 PM   Specimen: BLOOD  Result Value Ref Range Status   Specimen Description BLOOD BLOOD LEFT HAND  Final   Special Requests   Final    BOTTLES DRAWN AEROBIC ONLY Blood Culture adequate volume   Culture   Final    NO GROWTH 5 DAYS Performed at [facility], [address] [place], [place] [address]    Report Status 05/18/2019 FINAL  Final  CULTURE, BLOOD (ROUTINE X 2) w Reflex to ID Panel     Status: Abnormal (Preliminary result)   Collection Time: 05/14/19  3:33 PM   Specimen: BLOOD  Result Value Ref Range Status   Specimen Description   Final    BLOOD BLOOD RIGHT HAND Performed at [facility], [address], [place], [place] [address]    Special Requests   Final    BOTTLES DRAWN AEROBIC AND ANAEROBIC Blood Culture adequate volume Performed at [facility], [address] [place], [place] [address]    Culture  Setup Time   Final    GRAM POSITIVE COCCI IN CLUSTERS ANAEROBIC BOTTLE ONLY CRITICAL VALUE NOTED.  VALUE IS CONSISTENT WITH PREVIOUSLY REPORTED AND CALLED VALUE.    Culture (A)  Final    STAPHYLOCOCCUS AUREUS Sent to [place] for further susceptibility  testing. Performed at [facility], [place] [address] [place], [place] [address]    Report Status PENDING  Incomplete  Cath Tip Culture     Status: None   Collection Time: 05/14/19  4:24 PM   Specimen: Catheter Tip; Other  Result Value Ref Range Status   Specimen Description   Final    CATH TIP Performed at [facility], [address], [place], [place] [address]    Special Requests   Final    NONE Performed at [facility], [address], [place], [place] [address]    Culture   Final    NO GROWTH 3 DAYS Performed at [facility], [place] [address], [place], [place] [address]    Report Status 05/17/2019 FINAL  Final  CULTURE, BLOOD (ROUTINE X 2) w Reflex to ID Panel     Status: Abnormal   Collection Time: 05/14/19  6:25 PM   Specimen: BLOOD  Result Value Ref Range Status   Specimen Description   Final    BLOOD BLOOD LEFT HAND Performed at [facility], [address] [place], [place] [address]    Special Requests   Final    BOTTLES DRAWN AEROBIC AND ANAEROBIC Blood Culture results may not be optimal due to an inadequate volume of blood received in culture bottles Performed at [facility], [address], [place], [place] [address]    Culture  Setup Time   Final    GRAM POSITIVE COCCI ANAEROBIC BOTTLE ONLY CRITICAL VALUE NOTED.  VALUE IS CONSISTENT WITH PREVIOUSLY REPORTED AND CALLED VALUE. [place] Performed at [facility], [address] [place], [place] [address]    Culture (A)  Final    STAPHYLOCOCCUS AUREUS SUSCEPTIBILITIES PERFORMED ON PREVIOUS CULTURE WITHIN THE LAST 5 DAYS. Performed at [facility], [place] [address] [place], [place] [address]    Report Status 05/17/2019 FINAL  Final  Culture, blood (Routine X 2) w Reflex to ID Panel     Status: None   Collection Time: 05/16/19 10:45 AM   Specimen: BLOOD  Result Value Ref Range Status   Specimen Description BLOOD RIGHT HAND  Final   Special  Requests BLOOD Blood Culture adequate volume  Final   Culture   Final    NO GROWTH 5 DAYS Performed at [facility], [address] [place], [place] [address]    Report Status 05/13/2019 FINAL  Final  Culture, blood (Routine X 2) w Reflex to ID Panel     Status: None   Collection Time: 05/16/19 12:38 PM   Specimen: BLOOD  Result Value Ref Range Status   Specimen Description BLOOD RT HAND  Final   Special Requests   Final    BOTTLES DRAWN AEROBIC AND ANAEROBIC Blood Culture adequate volume   Culture   Final    NO GROWTH 5 DAYS Performed at [facility] [place], [address] [place], [place] [address]    Report Status 05/18/2019 FINAL  Final         Radiology Studies: [facility] Chest Port 1 View  Result Date: 05/12/2019 CLINICAL DATA:  Tachypnea.  Renal failure EXAM: PORTABLE CHEST 1 VIEW COMPARISON:  May 11, 2019. FINDINGS: Central catheter tip is in the right atrium. No pneumothorax. There is cardiomegaly with pulmonary venous hypertension. There are small pleural effusions bilaterally. There is interstitial thickening throughout the lungs as well as patchy areas of airspace opacity, primarily in the perihilar regions. There is aortic atherosclerosis.  No evident bone lesions. IMPRESSION: Central catheter tip in right atrium. Cardiomegaly with pulmonary vascular congestion and small pleural effusions. Findings most likely due to interstitial and alveolar edema. Overall appearance consistent with congestive heart failure. Areas of superimposed pneumonia cannot be excluded radiographically. Aortic Atherosclerosis (ICD10-I70.0). Electronically Signed   By: Lowella Grip III M.D.   On: 06/02/2019 09:05      Scheduled Meds: . sodium chloride   Intravenous Once  . amLODipine  10 mg Oral Daily  . vitamin C  500 mg Oral BID  . atorvastatin  10 mg Oral q1800  . carvedilol  25 mg Oral BID WC  . Chlorhexidine Gluconate Cloth  6 each Topical Daily  . feeding supplement (NEPRO  CARB STEADY)  237 mL Oral BID  BM  . insulin aspart  0-9 Units Subcutaneous Q4H  . midodrine  5 mg Oral Q M,W,F  . mirtazapine  7.5 mg Oral QHS  . multivitamin  1 tablet Oral QHS  . pantoprazole (PROTONIX) IV  40 mg Intravenous QHS  . sertraline  50 mg Oral Daily   Continuous Infusions: . sodium chloride 50 mL (06/01/2019 1833)  . ceFTAROline (TEFLARO) IV 200 mg (06/02/2019 1420)  . DAPTOmycin (CUBICIN)  IV 500 mg (06/02/2019 2116)  . levETIRAcetam 500 mg (06/04/2019 1753)     LOS: 11 days      Debbe Odea, MD [facility] Pager: [email or web address] 05/19/2019, 6:34 PM

## 2019-05-21 NOTE — Progress Notes (Signed)
SLP Cancellation Note  Patient Details Name: Calina Patrie MRN: [id number] DOB: 15-Aug-1945   Cancelled treatment:       Reason Eval/Treat Not Completed: Patient at procedure or test/unavailable(chart reviewed). GI is following today for upper endoscopy to evaluate for proximal esophageal ring and dilation need.  ST services will f/u tomorrow for ongoing dysphagia tx and trials to continue to upgrade pt's diet(foods). Recommend oral care for hygiene and stimulation of swallowing; aspiration precautions.     Orinda Kenner, MS, CCC-SLP Kendrell Lottman 05/18/2019, 11:13 AM

## 2019-05-21 NOTE — Anesthesia Preprocedure Evaluation (Signed)
Anesthesia Evaluation  Patient identified by MRN, date of birth, ID band Patient awake    Reviewed: Allergy & Precautions, H&P , NPO status , Patient's Chart, lab work & pertinent test results, reviewed documented beta blocker date and time   Airway Mallampati: II   Neck ROM: full    Dental  (+) Poor Dentition   Pulmonary neg pulmonary ROS,    Pulmonary exam normal        Cardiovascular Exercise Tolerance: Poor hypertension, On Medications + CAD  negative cardio ROS Normal cardiovascular exam Rhythm:regular Rate:Normal     Neuro/Psych PSYCHIATRIC DISORDERS Depression CVA negative neurological ROS  negative psych ROS   GI/Hepatic negative GI ROS, Neg liver ROS, GERD  Medicated,  Endo/Other  negative endocrine ROSdiabetes, Well Controlled, Oral Hypoglycemic Agents  Renal/GU ESRF and DialysisRenal diseasenegative Renal ROS  negative genitourinary   Musculoskeletal   Abdominal   Peds  Hematology negative hematology ROS (+)   Anesthesia Other Findings Past Medical History: No date: Chronic kidney disease     Comment:  RENAL INSUFFICIENCY No date: Coronary artery disease No date: Depression No date: Diabetes mellitus without complication (HCC) No date: GERD (gastroesophageal reflux disease) No date: Glaucoma No date: Hyperlipidemia No date: Hypertension No date: Stroke [facility]) Past Surgical History: No date: ABDOMINAL HYSTERECTOMY 05/14/2019: DIALYSIS/PERMA CATHETER INSERTION; N/A     Comment:  Procedure: DIALYSIS/PERMA CATHETER INSERTION;  Surgeon:               Algernon Huxley, MD;  Location: [place] CV LAB;                Service: Cardiovascular;  Laterality: N/A; 01/21/2018: ESOPHAGOGASTRODUODENOSCOPY (EGD) WITH PROPOFOL; N/A     Comment:  Procedure: ESOPHAGOGASTRODUODENOSCOPY (EGD) WITH               PROPOFOL;  Surgeon: Manya Silvas, MD;  Location:               [facility] ENDOSCOPY;  Service: Endoscopy;   Laterality: N/A; 05/31/2019: TEMPORARY DIALYSIS CATHETER; N/A     Comment:  Procedure: TEMPORARY DIALYSIS CATHETER;  Surgeon: Algernon Huxley, MD;  Location: [place] [facility];  Service:               Cardiovascular;  Laterality: N/A; BMI    Body Mass Index: 29.03 kg/m     Reproductive/Obstetrics negative OB ROS                             Anesthesia Physical Anesthesia Plan  ASA: III  Anesthesia Plan: General   Post-op Pain Management:    Induction:   PONV Risk Score and Plan:   Airway Management Planned:   Additional Equipment:   Intra-op Plan:   Post-operative Plan:   Informed Consent: I have reviewed the patients History and Physical, chart, labs and discussed the procedure including the risks, benefits and alternatives for the proposed anesthesia with the patient or authorized representative who has indicated his/her understanding and acceptance.     Dental Advisory Given  Plan Discussed with: CRNA  Anesthesia Plan Comments:         Anesthesia Quick Evaluation

## 2019-05-21 NOTE — Progress Notes (Signed)
Date of Admission:  05/23/2019     ID: Crystal Vargas is a 74 y.o. female  Principal Problem:   MRSA bacteremia Active Problems:   Diabetes ([place])   Essential hypertension   ESRD on dialysis ([place])   Shock ([place])   Status post CVA   Schatzki's ring    Subjective: None available from patient  Medications:  . sodium chloride   Intravenous Once  . amLODipine  10 mg Oral Daily  . vitamin C  500 mg Oral BID  . atorvastatin  10 mg Oral q1800  . carvedilol  25 mg Oral BID WC  . Chlorhexidine Gluconate Cloth  6 each Topical Daily  . feeding supplement (NEPRO CARB STEADY)  237 mL Oral BID BM  . insulin aspart  0-9 Units Subcutaneous Q4H  . midodrine  5 mg Oral Q M,W,F  . multivitamin  1 tablet Oral QHS  . pantoprazole (PROTONIX) IV  40 mg Intravenous QHS    Objective: Vital signs in last 24 hours: Temp:  [98.2 F (36.8 C)-98.6 F (37 C)] 98.2 F (36.8 C) (04/14 0935) Pulse Rate:  [57-67] 59 (04/14 1215) Resp:  [17-36] 31 (04/14 1215) BP: (89-154)/(11-87) 105/66 (04/14 1215) SpO2:  [97 %-100 %] 98 % (04/14 1215)  PHYSICAL EXAM:  General: lethargic- on calling her many times she woke up and said she is fine Lungs: b/l air entry- some rhonchi heard. Heart: irregular Abdomen: Soft,  Extremities: atraumatic, no cyanosis. No edema. No clubbing Skin: No rashes or lesions. some bruising abdominal wall Lymph: Cervical, supraclavicular normal. Neurologic: left hemiplegia  Lab Results Recent Labs    05/12/2019 0624 06/01/2019 0722 06/06/2019 0928 05/09/2019 0640  WBC   < >  --  7.8 9.2  HGB   < >  --  11.4* 10.1*  HCT   < >  --  34.2* 30.0*  NA  --  148*  --  145  K  --  5.1  --  3.6  CL  --  118*  --  111  CO2  --  21*  --  25  BUN  --  56*  --  35*  CREATININE  --  4.10*  --  2.92*   < > = values in this interval not displayed.   Liver Panel Recent Labs    05/30/2019 0722 05/24/2019 0640  PROT 5.5* 5.5*  ALBUMIN 1.9* 2.0*  AST 22 18  ALT 17 16  ALKPHOS 83 82  BILITOT  1.7* 1.6*   Studies/Results: PERIPHERAL VASCULAR CATHETERIZATION  Result Date: 05/11/2019 See op note  DG Chest Port 1 View  Result Date: 05/18/2019 CLINICAL DATA:  Tachypnea.  Renal failure EXAM: PORTABLE CHEST 1 VIEW COMPARISON:  May 11, 2019. FINDINGS: Central catheter tip is in the right atrium. No pneumothorax. There is cardiomegaly with pulmonary venous hypertension. There are small pleural effusions bilaterally. There is interstitial thickening throughout the lungs as well as patchy areas of airspace opacity, primarily in the perihilar regions. There is aortic atherosclerosis.  No evident bone lesions. IMPRESSION: Central catheter tip in right atrium. Cardiomegaly with pulmonary vascular congestion and small pleural effusions. Findings most likely due to interstitial and alveolar edema. Overall appearance consistent with congestive heart failure. Areas of superimposed pneumonia cannot be excluded radiographically. Aortic Atherosclerosis (ICD10-I70.0). Electronically Signed   By: Lowella Grip III M.D.   On: 06/02/2019 09:05   Micro 4/3 BC-MRSA 4/4 cath tip MRSA 4/5 BC and 4/6 BC - positive as well 4/7  BC MRSA 4/9 BC neg  Assessment/Plan: Prolonged MRSA bacteremia ( 4/3-4/7) in a patient with ESRD with infected HD catheter which has been removed on 4/4- No cardiac device or hardware- was on vanco and dapt was added on 4/7 and BC from 4/9 was negative. Vanco DC on 4/12 and ceftaroline added for dual MRSA therapy.  She will need TEE be=ut dysphagia and prior esophageal dilatation for schatzki's ring she is getting upper endoscopy first  . With persistent bacteremia she will need atleast 6 weeks of IV antibiotic  Cephalosporin allergy mentioned in the chart- family not aware. observe closely as she is  ceftaroline-no rash noted  ESRD on HD   Discussed the management with the nurse

## 2019-05-21 NOTE — Progress Notes (Signed)
PT Cancellation Note  Patient Details Name: Crystal Vargas MRN: [id number] DOB: 1945-04-06   Cancelled Treatment:    Reason Eval/Treat Not Completed: Other (comment) Pt at HD at this time and scheduled for upper endoscopy this date as well. Will f/u as able/as pt becomes available for PT treatment. Thank you.   Lieutenant Diego PT, DPT 12:35 PM,05/21/2019

## 2019-05-21 NOTE — Progress Notes (Signed)
OT Cancellation Note  Patient Details Name: Crystal Vargas MRN: [id number] DOB: 1945-02-18   Cancelled Treatment:    Reason Eval/Treat Not Completed: Patient at procedure or test/ unavailable  Pt at HD at this time and scheduled for upper endoscopy this date as well. Will f/u as able/as pt becomes available for OT treatment. Thank you.   Gerrianne Scale, [place], OTR/L ascom [phone] 05/31/2019, 12:07 PM

## 2019-05-21 NOTE — Transfer of Care (Signed)
Immediate Anesthesia Transfer of Care Note  Patient: Crystal Vargas  Procedure(s) Performed: ESOPHAGOGASTRODUODENOSCOPY (EGD) (N/A )  Patient Location: PACU and Endoscopy Unit  Anesthesia Type:General  Level of Consciousness: drowsy  Airway & Oxygen Therapy: Patient Spontanous Breathing and Patient connected to face mask oxygen  Post-op Assessment: Report given  Post vital signs: Reviewed and stable  Last Vitals:  Vitals Value Taken Time  BP 109/55 05/16/2019 1729  Temp    Pulse 59 05/31/2019 1730  Resp 28 05/19/2019 1730  SpO2 97 % 05/12/2019 1730  Vitals shown include unvalidated device data.  Last Pain:  Vitals:   05/16/2019 1729  TempSrc: (P) Tympanic  PainSc:          Complications: No apparent anesthesia complications

## 2019-05-21 NOTE — Op Note (Signed)
[facility] Patient Name: Crystal Vargas Procedure Date: 05/09/2019 4:45 PM MRN: [id number] Account #: [id number] Date of Birth: Apr 28, 1945 Admit Type: Outpatient Age: 74 Room: [facility] ENDO ROOM 4 Gender: Female Note Status: Finalized Procedure:             Upper GI endoscopy Indications:           Esophageal dysphagia, , Exclusion of esophageal                         stenosis, Preoperative assessment before TEE Providers:             Lin Landsman MD, MD Medicines:             Monitored Anesthesia Care Complications:         No immediate complications. Estimated blood loss:                         Minimal. Procedure:             Pre-Anesthesia Assessment:                        - Prior to the procedure, a History and Physical was                         performed, and patient medications and allergies were                         reviewed. The patient is competent. The risks and                         benefits of the procedure and the sedation options and                         risks were discussed with the patient. All questions                         were answered and informed consent was obtained.                         Patient identification and proposed procedure were                         verified by the physician, the nurse, the                         anesthesiologist, the anesthetist and the technician                         in the pre-procedure area in the procedure room in the                         endoscopy suite. Mental Status Examination: alert and                         oriented. Airway Examination: normal oropharyngeal                         airway and neck mobility. Respiratory Examination:  basilar crackles in both lungs. CV Examination:                         normal. Prophylactic Antibiotics: The patient does not                         require prophylactic antibiotics. Prior        Anticoagulants: The patient has taken no previous                         anticoagulant or antiplatelet agents. ASA Grade                         Assessment: III - A patient with severe systemic                         disease. After reviewing the risks and benefits, the                         patient was deemed in satisfactory condition to                         undergo the procedure. The anesthesia plan was to use                         monitored anesthesia care (MAC). Immediately prior to                         administration of medications, the patient was                         re-assessed for adequacy to receive sedatives. The                         heart rate, respiratory rate, oxygen saturations,                         blood pressure, adequacy of pulmonary ventilation, and                         response to care were monitored throughout the                         procedure. The physical status of the patient was                         re-assessed after the procedure.                        After obtaining informed consent, the endoscope was                         passed under direct vision. Throughout the procedure,                         the patient's blood pressure, pulse, and oxygen                         saturations were  monitored continuously. The Endoscope                         was introduced through the mouth, and advanced to the                         second part of duodenum. The upper GI endoscopy was                         accomplished without difficulty. The patient tolerated                         the procedure well. Findings:      One benign-appearing, intrinsic moderate (circumferential scarring or       stenosis; an endoscope may pass) stenosis was found 36 cm from the       incisors, near the GE junction. The stenosis was traversed. The dilation       site was examined following endoscope reinsertion and showed moderate       mucosal  disruption and complete resolution of luminal narrowing.       Estimated blood loss was minimal.      The upper third of the esophagus and middle third of the esophagus were       normal.      The entire examined stomach was normal.      The cardia and gastric fundus were normal on retroflexion.      The duodenal bulb and second portion of the duodenum were normal. Impression:            - Benign-appearing esophageal stenosis.                        - Normal upper third of esophagus and middle third of                         esophagus.                        - Normal stomach.                        - Normal duodenal bulb and second portion of the                         duodenum.                        - No specimens collected. Recommendation:        - Return patient to hospital ward for ongoing care.                        - Advance diet as tolerated, chopped diet and                         mechanical soft diet today.                        - Continue present medications.                        - Repeat upper endoscopy in 1 month for  retreatment                         with Dr Alice Reichert.                        - Return to GI clinic with Dr Alice Reichert.                        - Follow an antireflux regimen for the rest of the                         patient's life.                        - Use a proton pump inhibitor PO BID for the rest of                         the patient's life. Procedure Code(s):     --- Professional ---                        [phone], Esophagogastroduodenoscopy, flexible,                         transoral; diagnostic, including collection of                         specimen(s) by brushing or washing, when performed                         (separate procedure) Diagnosis Code(s):     --- Professional ---                        K22.2, Esophageal obstruction                        R13.14, Dysphagia, pharyngoesophageal phase                        Z01.818, Encounter for  other preprocedural examination CPT copyright 2019 [employer]. All rights reserved. The codes documented in this report are preliminary and upon coder review may  be revised to meet current compliance requirements. Dr. Ulyess Mort Lin Landsman MD, MD 05/16/2019 5:29:29 PM This report has been signed electronically. Number of Addenda: 0 Note Initiated On: 06/06/2019 4:45 PM Estimated Blood Loss:  Estimated blood loss was minimal.      [facility]

## 2019-05-22 ENCOUNTER — Encounter: Admission: EM | Disposition: E | Payer: Self-pay | Source: Home / Self Care | Attending: Internal Medicine

## 2019-05-22 ENCOUNTER — Encounter: Payer: Self-pay | Admitting: *Deleted

## 2019-05-22 ENCOUNTER — Encounter: Payer: Self-pay | Admitting: Anesthesiology

## 2019-05-22 DIAGNOSIS — R7881 Bacteremia: Secondary | ICD-10-CM | POA: Diagnosis not present

## 2019-05-22 DIAGNOSIS — B9562 Methicillin resistant Staphylococcus aureus infection as the cause of diseases classified elsewhere: Secondary | ICD-10-CM | POA: Diagnosis not present

## 2019-05-22 HISTORY — PX: TEE WITHOUT CARDIOVERSION: SHX5443

## 2019-05-22 LAB — GLUCOSE, CAPILLARY
Glucose-Capillary: 119 mg/dL — ABNORMAL HIGH (ref 70–99)
Glucose-Capillary: 131 mg/dL — ABNORMAL HIGH (ref 70–99)
Glucose-Capillary: 133 mg/dL — ABNORMAL HIGH (ref 70–99)
Glucose-Capillary: 134 mg/dL — ABNORMAL HIGH (ref 70–99)
Glucose-Capillary: 134 mg/dL — ABNORMAL HIGH (ref 70–99)
Glucose-Capillary: 135 mg/dL — ABNORMAL HIGH (ref 70–99)

## 2019-05-22 LAB — BASIC METABOLIC PANEL
Anion gap: 13 (ref 5–15)
BUN: 19 mg/dL (ref 8–23)
CO2: 23 mmol/L (ref 22–32)
Calcium: 8.3 mg/dL — ABNORMAL LOW (ref 8.9–10.3)
Chloride: 106 mmol/L (ref 98–111)
Creatinine, Ser: 1.85 mg/dL — ABNORMAL HIGH (ref 0.44–1.00)
GFR calc Af Amer: 30 mL/min — ABNORMAL LOW (ref >60)
GFR calc non Af Amer: 26 mL/min — ABNORMAL LOW (ref >60)
Glucose, Bld: 140 mg/dL — ABNORMAL HIGH (ref 70–99)
Potassium: 3.6 mmol/L (ref 3.5–5.1)
Sodium: 142 mmol/L (ref 135–145)

## 2019-05-22 LAB — TYPE AND SCREEN
ABO/RH(D): O POS
Antibody Screen: POSITIVE
Donor AG Type: NEGATIVE
Donor AG Type: NEGATIVE
Unit division: 0
Unit division: 0
Unit division: 0
Unit division: 0

## 2019-05-22 LAB — BPAM RBC
Blood Product Expiration Date: 202104282359
Blood Product Expiration Date: 202105012359
Blood Product Expiration Date: 202105112359
Blood Product Expiration Date: 202105182359
ISSUE DATE / TIME: 202104121344
ISSUE DATE / TIME: 202104121947
Unit Type and Rh: 5100
Unit Type and Rh: 5100
Unit Type and Rh: 5100
Unit Type and Rh: 5100

## 2019-05-22 SURGERY — ECHOCARDIOGRAM, TRANSESOPHAGEAL
Anesthesia: Moderate Sedation

## 2019-05-22 SURGERY — ECHOCARDIOGRAM, TRANSESOPHAGEAL
Anesthesia: Choice

## 2019-05-22 MED ORDER — INSULIN ASPART 100 UNIT/ML ~~LOC~~ SOLN
0.0000 [IU] | Freq: Three times a day (TID) | SUBCUTANEOUS | Status: DC
  Administered 2019-05-22 – 2019-05-25 (×4): 2 [IU] via SUBCUTANEOUS
  Filled 2019-05-22 (×4): qty 1

## 2019-05-22 MED ORDER — ALBUMIN HUMAN 5 % IV SOLN
12.5000 g | Freq: Once | INTRAVENOUS | Status: AC
  Administered 2019-05-22: 12.5 g via INTRAVENOUS
  Filled 2019-05-22: qty 250

## 2019-05-22 MED ORDER — FUROSEMIDE 10 MG/ML IJ SOLN
40.0000 mg | Freq: Once | INTRAMUSCULAR | Status: AC
  Administered 2019-05-22: 40 mg via INTRAVENOUS
  Filled 2019-05-22: qty 4

## 2019-05-22 MED ORDER — FUROSEMIDE 10 MG/ML IJ SOLN
40.0000 mg | Freq: Once | INTRAMUSCULAR | Status: AC
  Administered 2019-05-22: 11:00:00 40 mg via INTRAVENOUS
  Filled 2019-05-22: qty 4

## 2019-05-22 MED ORDER — INSULIN ASPART 100 UNIT/ML ~~LOC~~ SOLN: 0.0000 [IU] | Freq: Every day | SUBCUTANEOUS | Status: DC

## 2019-05-22 MED ORDER — CHLORHEXIDINE GLUCONATE CLOTH 2 % EX PADS
6.0000 | MEDICATED_PAD | Freq: Every day | CUTANEOUS | Status: DC
  Administered 2019-05-23 – 2019-06-03 (×8): 6 via TOPICAL

## 2019-05-22 NOTE — Hospital Course (Addendum)
12/15@12am  - Received IV Lasix 40 mg x 1 and Albumin for worsening volume overload noted on physical exam and chest xray with significant improvement. Not using accessory muscle for breathing.

## 2019-05-22 NOTE — Progress Notes (Addendum)
Patient Name: Crystal Vargas Date of Encounter: 05/16/2019  Hospital Problem List     Principal Problem:   MRSA bacteremia Active Problems:   Diabetes [facility])   Essential hypertension   ESRD on dialysis ([place])   Shock ([place])   Status post CVA   Schatzki's ring   Acute respiratory failure ([place])   Hypokalemia    Patient Profile     74 y.o. 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  She is unable to give a history.  She lives in a skilled nursing facility.  She dialyzes Monday Wednesday Friday.  She apparently had an abrupt change in her mental status and was found to be hypotensive.  She was brought to the emergency room.  On admission she was profoundly hypokalemic with a potassium of 2.11 hemoglobin of 6.9 platelet count of 82,000 with albumin of 1.3.  She was noted to be MRSA sepsis.Marland Kitchen  She was seen by infectious disease and treated with antibiotics.  Echocardiogram transthoracic revealed EF of 45 to 50% with moderate concentric LVH.  There was no obvious vegetations noted there was mild to moderate MR moderate to severe TR.   Subjective   Difficult historian.  Consent will be obtained from patient's daughter  Inpatient Medications    . sodium chloride   Intravenous Once  . allopurinol  100 mg Oral QPM  . amLODipine  10 mg Oral Daily  . vitamin C  500 mg Oral BID  . atorvastatin  10 mg Oral q1800  . brimonidine  1 drop Both Eyes Q12H   And  . timolol  1 drop Both Eyes Q12H  . carvedilol  25 mg Oral BID WC  . [START ON 05/23/2019] Chlorhexidine Gluconate Cloth  6 each Topical Daily  . feeding supplement (NEPRO CARB STEADY)  237 mL Oral BID BM  . furosemide  40 mg Intravenous Once  . insulin aspart  0-15 Units Subcutaneous TID WC  . insulin aspart  0-5 Units Subcutaneous QHS  .  latanoprost  1 drop Both Eyes QPM  . levETIRAcetam  1,000 mg Oral QPM  . midodrine  5 mg Oral Q M,W,F  . mirtazapine  7.5 mg Oral QHS  . multivitamin  1 tablet Oral QHS  . pantoprazole (PROTONIX) IV  40 mg Intravenous QHS  . sertraline  50 mg Oral Daily    Vital Signs    Vitals:   05/08/2019 1749 05/28/2019 1820 05/23/2019 2227 05/13/2019 0200  BP:  133/71 114/69   Pulse:  61 60   Resp: (!) 30 (!) 30 (!) 32 (!) 35  Temp:  98.4 F (36.9 C) 98.6 F (37 C)   TempSrc:  Axillary Oral   SpO2:  95% 96%   Weight:      Height:        Intake/Output Summary (Last 24 hours) at 05/25/2019 0902 Last data filed at 05/23/2019 0300 Gross per 24 hour  Intake 759.01 ml  Output 0 ml  Net 759.01 ml   Filed Weights   05/17/19 0500 05/18/19 0451 05/11/2019 1608  Weight: 80.1 kg 72 kg 72 kg    Physical Exam    GEN: Well nourished, well developed, in no acute distress.  HEENT: normal.  Neck: Supple, no JVD, carotid bruits, or masses. Cardiac: RRR, no murmurs, rubs, or  gallops. No clubbing, cyanosis, edema.  Radials/DP/PT 2+ and equal bilaterally.  Respiratory:  Respirations regular and unlabored, clear to auscultation bilaterally. GI: Soft, nontender, nondistended, BS + x 4. MS: no deformity or atrophy. Skin: warm and dry, no rash. Neuro:  Strength and sensation are intact. Psych: Normal affect.  Labs    CBC Recent Labs    05/15/2019 0928 05/23/2019 0640  WBC 7.8 9.2  HGB 11.4* 10.1*  HCT 34.2* 30.0*  MCV 90.0 90.4  PLT 138* 563*   Basic Metabolic Panel Recent Labs    05/31/2019 0640 06/01/2019 0640 06/04/2019 1540 05/11/2019 0032  NA 145  --   --  142  K 3.6   < > 3.6 3.6  CL 111  --   --  106  CO2 25  --   --  23  GLUCOSE 135*  --   --  140*  BUN 35*  --   --  19  CREATININE 2.92*  --   --  1.85*  CALCIUM 8.2*  --   --  8.3*   < > = values in this interval not displayed.   Liver Function Tests Recent Labs    05/31/2019 0722 05/23/2019 0640  AST 22 18  ALT 17 16  ALKPHOS 83 82   BILITOT 1.7* 1.6*  PROT 5.5* 5.5*  ALBUMIN 1.9* 2.0*   No results for input(s): LIPASE, AMYLASE in the last 72 hours. Cardiac Enzymes Recent Labs    05/15/2019 0640  CKTOTAL 16*   BNP No results for input(s): BNP in the last 72 hours. D-Dimer No results for input(s): DDIMER in the last 72 hours. Hemoglobin A1C No results for input(s): HGBA1C in the last 72 hours. Fasting Lipid Panel No results for input(s): CHOL, HDL, LDLCALC, TRIG, CHOLHDL, LDLDIRECT in the last 72 hours. Thyroid Function Tests No results for input(s): TSH, T4TOTAL, T3FREE, THYROIDAB in the last 72 hours.  Invalid input(s): FREET3  Telemetry    Sinus rhythm  ECG       Radiology    CT Head Wo Contrast  Result Date: 05/08/2019 CLINICAL DATA:  Headache, head trauma. Altered mental status EXAM: CT HEAD WITHOUT CONTRAST TECHNIQUE: Contiguous axial images were obtained from the base of the skull through the vertex without intravenous contrast. COMPARISON:  None. FINDINGS: Brain: No acute hemorrhage. No subdural or extra-axial collection. Large chronic right MCA distribution encephalomalacia. Associated ex vacuo dilatation of the right lateral ventricle. Background periventricular chronic small vessel ischemia. No evidence of acute infarct. Vascular: Atherosclerosis of skullbase vasculature without hyperdense vessel or abnormal calcification. Skull: No fracture or focal lesion. Sinuses/Orbits: Paranasal sinuses and mastoid air cells are clear. The visualized orbits are unremarkable. Other: None. IMPRESSION: 1. No acute intracranial abnormality. 2. Large remote right MCA distribution infarct with ex vacuo dilatation of the right lateral ventricle. Background chronic small vessel ischemia. Electronically Signed   By: Keith Rake M.D.   On: 05/24/2019 15:09   CT Cervical Spine Wo Contrast  Result Date: 06/06/2019 CLINICAL DATA:  Polytrauma, critical, head/C-spine injury suspected EXAM: CT CERVICAL SPINE WITHOUT  CONTRAST TECHNIQUE: Multidetector CT imaging of the cervical spine was performed without intravenous contrast. Multiplanar CT image reconstructions were also generated. COMPARISON:  None. FINDINGS: Alignment: No evidence of traumatic subluxation. Trace anterolisthesis of C6 on C7 is likely degenerative and facet mediated. Skull base and vertebrae: No acute fracture. Vertebral body heights are maintained. The dens and skull base are intact. Soft tissues and spinal canal: No prevertebral fluid or  swelling. No visible canal hematoma. Disc levels: Multilevel endplate spurring with mild disc space narrowing at C5-C6 and C6-C7. Multilevel facet hypertrophy. Upper chest: Right internal jugular dialysis catheter in place. Patulous esophagus containing intraluminal fluid/debris. Minimal patchy opacity in the perifissural left upper lobe, partially included. Other: Carotid calcifications. IMPRESSION: 1. Degenerative change in the cervical spine without acute fracture or subluxation. 2. Patulous esophagus containing intraluminal fluid/debris. Electronically Signed   By: Keith Rake M.D.   On: 06/06/2019 15:13   PERIPHERAL VASCULAR CATHETERIZATION  Result Date: 05/09/2019 See op note  PERIPHERAL VASCULAR CATHETERIZATION  Result Date: 05/08/2019 See op note  DG Chest Port 1 View  Result Date: 05/14/2019 CLINICAL DATA:  Hypoxia EXAM: PORTABLE CHEST 1 VIEW COMPARISON:  Film from the previous day FINDINGS: Cardiac shadow is enlarged. Aortic calcifications are again seen. Changes of vascular congestion with parenchymal edema are noted increased when compared with the prior study. Dialysis catheter is noted on the left stable in appearance. No sizable effusion is noted at this time. No bony abnormality is seen. IMPRESSION: Increasing vascular congestion and pulmonary edema. Electronically Signed   By: Inez Catalina M.D.   On: 05/15/2019 19:18   DG Chest Port 1 View  Result Date: 05/09/2019 CLINICAL DATA:   Tachypnea.  Renal failure EXAM: PORTABLE CHEST 1 VIEW COMPARISON:  May 11, 2019. FINDINGS: Central catheter tip is in the right atrium. No pneumothorax. There is cardiomegaly with pulmonary venous hypertension. There are small pleural effusions bilaterally. There is interstitial thickening throughout the lungs as well as patchy areas of airspace opacity, primarily in the perihilar regions. There is aortic atherosclerosis.  No evident bone lesions. IMPRESSION: Central catheter tip in right atrium. Cardiomegaly with pulmonary vascular congestion and small pleural effusions. Findings most likely due to interstitial and alveolar edema. Overall appearance consistent with congestive heart failure. Areas of superimposed pneumonia cannot be excluded radiographically. Aortic Atherosclerosis (ICD10-I70.0). Electronically Signed   By: Lowella Grip III M.D.   On: 05/18/2019 09:05   DG Chest Port 1 View  Result Date: 05/11/2019 CLINICAL DATA:  Respiratory failure EXAM: PORTABLE CHEST 1 VIEW COMPARISON:  May 10, 2019 FINDINGS: Central catheter tip in right atrium slightly beyond cavoatrial junction. No pneumothorax. There is atelectatic change in the medial right base. Lungs elsewhere clear. There is stable cardiac prominence with pulmonary vascularity normal. There is aortic atherosclerosis. No adenopathy. No bone lesions. IMPRESSION: No change in central catheter placement. No pneumothorax. Medial right base atelectasis. Lungs elsewhere clear. Stable cardiac prominence. Aortic Atherosclerosis (ICD10-I70.0). Electronically Signed   By: Lowella Grip III M.D.   On: 05/11/2019 08:36   DG Chest Portable 1 View  Result Date: 05/09/2019 CLINICAL DATA:  Shortness of breath EXAM: PORTABLE CHEST 1 VIEW COMPARISON:  None. FINDINGS: The heart size and mediastinal contours are within normal limits. Large-bore right neck multi lumen vascular catheter. Both lungs are clear. The visualized skeletal structures are  unremarkable. IMPRESSION: No acute abnormality of the lungs in AP portable projection. Electronically Signed   By: Eddie Candle M.D.   On: 05/09/2019 15:13   ECHOCARDIOGRAM COMPLETE  Result Date: 05/12/2019    ECHOCARDIOGRAM REPORT   Patient Name:   Crystal Vargas Date of Exam: 05/11/2019 Medical Rec #:  [id number]  Height:       62.0 in Accession #:    [id number] Weight:       136.2 lb Date of Birth:  1945-02-25  BSA:          1.624 m  Patient Age:    32 years   BP:           100/63 mmHg Patient Gender: F          HR:           64 bpm. Exam Location:  [facility] Procedure: 2D Echo Indications:     Bacteremia 790.7/ R78.81  History:         Patient has no prior history of Echocardiogram examinations.  Sonographer:     [place] Referring Phys:  [id number] Bradly Bienenstock Diagnosing Phys: Bartholome Bill MD IMPRESSIONS  1. Left ventricular ejection fraction, by estimation, is 45 to 50%. The left ventricle has mildly decreased function. The left ventricle has no regional wall motion abnormalities. There is moderate concentric left ventricular hypertrophy. Left ventricular diastolic parameters are consistent with Grade I diastolic dysfunction (impaired relaxation).  2. Right ventricular systolic function is normal. The right ventricular size is mildly enlarged. There is moderately elevated pulmonary artery systolic pressure.  3. Left atrial size was mildly dilated.  4. Right atrial size was mildly dilated.  5. The mitral valve was not well visualized. Mild to moderate mitral valve regurgitation.  6. Tricuspid valve regurgitation is moderate to severe.  7. The aortic valve is grossly normal. Aortic valve regurgitation is trivial. No aortic stenosis is present. FINDINGS  Left Ventricle: Left ventricular ejection fraction, by estimation, is 45 to 50%. The left ventricle has mildly decreased function. The left ventricle has no regional wall motion abnormalities. The left ventricular internal cavity size was normal in size.  There is moderate concentric left ventricular hypertrophy. Left ventricular diastolic parameters are consistent with Grade I diastolic dysfunction (impaired relaxation). Right Ventricle: The right ventricular size is mildly enlarged. No increase in right ventricular wall thickness. Right ventricular systolic function is normal. There is moderately elevated pulmonary artery systolic pressure. Left Atrium: Left atrial size was mildly dilated. Right Atrium: Right atrial size was mildly dilated. Pericardium: There is no evidence of pericardial effusion. Mitral Valve: The mitral valve was not well visualized. Mild to moderate mitral valve regurgitation. Tricuspid Valve: The tricuspid valve is not well visualized. Tricuspid valve regurgitation is moderate to severe. Aortic Valve: The aortic valve is grossly normal. Aortic valve regurgitation is trivial. No aortic stenosis is present. Aortic valve peak gradient measures 3.5 mmHg. Pulmonic Valve: The pulmonic valve was not well visualized. Pulmonic valve regurgitation is trivial. Aorta: The aortic root was not well visualized. IAS/Shunts: The interatrial septum was not assessed.  LEFT VENTRICLE PLAX 2D LVIDd:         3.67 cm     Diastology LVIDs:         2.86 cm     LV e' lateral:   6.74 cm/s LV PW:         1.27 cm     LV E/e' lateral: 14.4 LV IVS:        1.31 cm     LV e' medial:    5.44 cm/s LVOT diam:     1.90 cm     LV E/e' medial:  17.8 LV SV:         37 LV SV Index:   23 LVOT Area:     2.84 cm  LV Volumes (MOD) LV vol d, MOD A2C: 63.1 ml LV vol d, MOD A4C: 49.4 ml LV vol s, MOD A2C: 28.0 ml LV vol s, MOD A4C: 26.7 ml LV SV MOD A2C:     35.1  ml LV SV MOD A4C:     49.4 ml LV SV MOD BP:      28.7 ml RIGHT VENTRICLE RV Basal diam:  2.77 cm RV S prime:     7.72 cm/s TAPSE (M-mode): 1.2 cm LEFT ATRIUM             Index       RIGHT ATRIUM           Index LA diam:        3.60 cm 2.22 cm/m  RA Area:     15.60 cm LA Vol (A2C):   49.1 ml 30.24 ml/m RA Volume:   38.80 ml   23.89 ml/m LA Vol (A4C):   57.1 ml 35.16 ml/m LA Biplane Vol: 58.2 ml 35.84 ml/m  AORTIC VALVE                PULMONIC VALVE AV Area (Vmax): 1.93 cm    PV Vmax:       0.62 m/s AV Vmax:        93.50 cm/s  PV Peak grad:  1.6 mmHg AV Peak Grad:   3.5 mmHg LVOT Vmax:      63.50 cm/s LVOT Vmean:     38.700 cm/s LVOT VTI:       0.132 m  AORTA Ao Root diam: 2.50 cm MITRAL VALVE               TRICUSPID VALVE MV Area (PHT): 2.32 cm    TV Peak grad:   36.8 mmHg MV Decel Time: 327 msec    TV Vmax:        3.03 m/s MV E velocity: 97.00 cm/s MV A velocity: 44.60 cm/s  SHUNTS MV E/A ratio:  2.17        Systemic VTI:  0.13 m                            Systemic Diam: 1.90 cm Bartholome Bill MD Electronically signed by Bartholome Bill MD Signature Date/Time: 05/12/2019/7:18:18 AM    Final     Assessment & Plan    Patient with bacteremia with unremarkable transthoracic echo.  Asked to do transesophageal echo.  Procedure deferred earlier this week due to concern over esophageal strictures.  Endoscopy done yesterday and recommendations were to proceed with transesophageal echo as there were no contraindications from a stricture standpoint.  This am pt is tachypnaic and has evidence of volume overload. Will cancel tee until stable.  Signed, Javier Docker Adrien Shankar MD 05/15/2019, 9:02 AM  Pager: [phone]

## 2019-05-22 NOTE — Progress Notes (Addendum)
PROGRESS NOTE    Crystal Vargas   GQQ:[id number]  DOB: 1945/10/13  DOA: 06/06/2019 PCP: Marisa Hua, MD   Brief Narrative:  Crystal Vargas is a 74 y.o. female who is bed bound and from SNF with history of end-stage renal disease recently started on hemodialysis, history of right MCA stroke, history of dysphagia due to Schatzki's ring and is status post esophageal dilatation who was admitted with altered mental status.  Her daughter noted poor PO intake thought to be due to dysphagia.  In the ED, she was hypotensive and had a K of 2.1 and a Hb of 6.9.  She was given IVF, IV Abx and admitted to the ICU.   Subjective: She is covering her face when I try to talk to her and states she does not want to talk.      Assessment & Plan:   Principal Problem:   MRSA bacteremia/ septic shock - due to infected dialysis cath - appreciate ID assistance- currently on Daptomycin and Teflaro - will need TEE  Active Problems: Dysphagia- chronic issue - EGD performed 4/14 revealed and esophageal stenosis that was dilated  - OK to proceed with TEE for above  Acute hypoxic respiratory failure - CXR suggestive of pulmonary edema - on 3-4 L of O 2 with pulse ox in mid 90s - RR in high 20-low 30 range - Have asked cardiology to hold off on TTE in light of these findings - fluid removal via dialysis today- will follow pulse ox and respiratory status after  ESRD - infected cath removed and tunnelled HD cath placed on 4/12 -cont dialysis per renal team  H/o HTN -cont Coreg with holding parameters  Acute anemia (possible related to infection/ acute illness) in addition to CKD - has been transfused 2 U PRBC so far    Diabetes Mellitus 2 - cont moderate dose SSI - A1c checked on 4/3 was 5.6   Status post CVA  - bed bound  Seizure disorder - cont Keppra-    Mild thrombocytopenia - possible due to sepsis- follow   Poor oral intake  eating < 50 % of meals Interventions: Ensure Enlive (each  supplement provides 350kcal and 20 grams of protein), Magic cup, MVI   Time spent in minutes: 35 DVT prophylaxis: SCDs Code Status: Full code Family Communication:  Disposition Plan: return to SNF eventually - currently on IV Abx for bacteremia-waiting to see if they will be approved for outpt use- needs TEE- is hypoxic-  Discussed plan with ID, and Renal Consultants:  Nephro CArdiology GI ID Palliative care Procedures:   4/14- EGD Benign-appearing esophageal stenosis  4/12- left IJ  2 D ECHO 1. Left ventricular ejection fraction, by estimation, is 45 to 50%. The  left ventricle has mildly decreased function. The left ventricle has no  regional wall motion abnormalities. There is moderate concentric left  ventricular hypertrophy. Left  ventricular diastolic parameters are consistent with Grade I diastolic  dysfunction (impaired relaxation).  2. Right ventricular systolic function is normal. The right ventricular  size is mildly enlarged. There is moderately elevated pulmonary artery  systolic pressure.  3. Left atrial size was mildly dilated.  4. Right atrial size was mildly dilated.  5. The mitral valve was not well visualized. Mild to moderate mitral  valve regurgitation.  6. Tricuspid valve regurgitation is moderate to severe.  7. The aortic valve is grossly normal. Aortic valve regurgitation is  trivial. No aortic stenosis is present.  Antimicrobials:  Anti-infectives (From admission,  onward)   Start     Dose/Rate Route Frequency Ordered Stop   05/11/2019 1645  clindamycin (CLEOCIN) IVPB 300 mg  Status:  Discontinued     300 mg 100 mL/hr over 30 Minutes Intravenous  Once 05/17/2019 1642 05/09/2019 1804   06/05/2019 1645  clindamycin (CLEOCIN) 300 MG/50ML IVPB    Note to Pharmacy: Genelle Bal   : cabinet override      06/05/2019 1645 05/11/2019 0459   05/11/2019 1600  ceftaroline (TEFLARO) 200 mg in sodium chloride 0.9 % 250 mL IVPB     200 mg 250 mL/hr over 60 Minutes  Intravenous Every 8 hours 05/23/2019 1328     05/09/2019 1200  vancomycin (VANCOREADY) IVPB 750 mg/150 mL     750 mg 150 mL/hr over 60 Minutes Intravenous Every Thu (Hemodialysis) 05/09/2019 1126 05/13/2019 1809   05/14/19 2000  DAPTOmycin (CUBICIN) 500 mg in sodium chloride 0.9 % IVPB     500 mg 220 mL/hr over 30 Minutes Intravenous Every 48 hours 05/14/19 1550     05/12/19 1645  vancomycin (VANCOREADY) IVPB 750 mg/150 mL     750 mg 150 mL/hr over 60 Minutes Intravenous  Once 05/12/19 1635 05/12/19 1819   05/12/19 1358  vancomycin variable dose per unstable renal function (pharmacist dosing)  Status:  Discontinued      Does not apply See admin instructions 05/12/19 1358 06/04/2019 1120   05/12/19 1200  vancomycin (VANCOREADY) IVPB 500 mg/100 mL  Status:  Discontinued     500 mg 100 mL/hr over 60 Minutes Intravenous Every M-W-F (Hemodialysis) 05/14/2019 2016 06/02/2019 2018   05/12/19 1200  vancomycin (VANCOREADY) IVPB 750 mg/150 mL  Status:  Discontinued     750 mg 150 mL/hr over 60 Minutes Intravenous Every M-W-F (Hemodialysis) 05/13/2019 2018 05/12/19 1358   05/21/2019 2100  aztreonam (AZACTAM) 1 g in sodium chloride 0.9 % 100 mL IVPB  Status:  Discontinued     1 g 200 mL/hr over 30 Minutes Intravenous Every 24 hours 05/17/2019 2016 05/11/19 1019   05/17/2019 1500  aztreonam (AZACTAM) injection 2 g  Status:  Discontinued     2 g Intramuscular Once 05/12/2019 1411 06/05/2019 1442   05/08/2019 1445  aztreonam (AZACTAM) 2 g in sodium chloride 0.9 % 100 mL IVPB  Status:  Discontinued     2 g 200 mL/hr over 30 Minutes Intravenous  Once 05/15/2019 1442 05/09/2019 2016   05/11/2019 1430  vancomycin (VANCOREADY) IVPB 1250 mg/250 mL     1,250 mg 166.7 mL/hr over 90 Minutes Intravenous  Once 05/17/2019 1411 05/09/2019 1635       Objective: Vitals:   05/08/2019 0200 05/08/2019 0932 05/11/2019 1102 05/16/2019 1157  BP:   (!) 144/72 (!) 166/62  Pulse:  66 66 65  Resp: (!) 35 (!) 30  (!) 28  Temp:  97.9 F (36.6 C)  98.1 F (36.7  C)  TempSrc:  Oral  Oral  SpO2:  100%  95%  Weight:      Height:        Intake/Output Summary (Last 24 hours) at 05/21/2019 1651 Last data filed at 05/13/2019 0300 Gross per 24 hour  Intake 759.01 ml  Output --  Net 759.01 ml   Filed Weights   05/17/19 0500 05/18/19 0451 05/27/2019 1608  Weight: 80.1 kg 72 kg 72 kg    Examination: General exam: Appears comfortable  HEENT: PERRLA, oral mucosa moist, no sclera icterus or thrush Respiratory system: crackles at bases RR in 30s Cardiovascular  system: S1 & S2 heard,  No murmurs  Gastrointestinal system: Abdomen soft, non-tender, nondistended. Normal bowel sounds   Central nervous system: Alert and oriented. Weak left side Extremities: No cyanosis, clubbing or edema Skin: No rashes or ulcers Psychiatry:  appears depressed     Data Reviewed: I have personally reviewed following labs and imaging studies  CBC: Recent Labs  Lab 05/17/19 0423 05/18/19 0751 05/24/2019 0624 05/30/2019 0928 05/16/2019 0640  WBC 9.2 9.5 9.3 7.8 9.2  HGB 7.8* 7.1* 6.5* 11.4* 10.1*  HCT 23.1* 20.7* 19.6* 34.2* 30.0*  MCV 94.3 93.7 94.7 90.0 90.4  PLT 48* 110* 134* 138* 010*   Basic Metabolic Panel: Recent Labs  Lab 05/16/19 0327 05/17/19 0423 05/18/19 0751 05/18/19 0751 05/30/2019 0624 05/24/2019 0722 05/24/2019 0640 06/01/2019 1540 05/08/2019 0032  NA 142   < > 145  --  145 148* 145  --  142  K 3.5   < > 3.5   < > 3.8 5.1 3.6 3.6 3.6  CL 108   < > 111  --  111 118* 111  --  106  CO2 26   < > 25  --  24 21* 25  --  23  GLUCOSE 269*   < > 101*  --  135* 120* 135*  --  140*  BUN 24*   < > 43*  --  47* 56* 35*  --  19  CREATININE 2.16*   < > 3.24*  --  3.66* 4.10* 2.92*  --  1.85*  CALCIUM 8.8*   < > 8.8*  --  8.7* 8.8* 8.2*  --  8.3*  PHOS 1.3*  --   --   --   --   --   --   --   --    < > = values in this interval not displayed.   GFR: Estimated Creatinine Clearance: 24.1 mL/min (A) (by C-G formula based on SCr of 1.85 mg/dL (H)). Liver Function  Tests: Recent Labs  Lab 05/17/19 0423 05/18/19 0751 05/16/2019 0624 06/02/2019 0722 05/24/2019 0640  AST 22 18 21 22 18   ALT 19 17 18 17 16   ALKPHOS 78 72 77 83 82  BILITOT 1.7* 1.5* 1.7* 1.7* 1.6*  PROT 4.9* 5.0* 5.0* 5.5* 5.5*  ALBUMIN 2.1* 2.0* 1.8* 1.9* 2.0*   No results for input(s): LIPASE, AMYLASE in the last 168 hours. No results for input(s): AMMONIA in the last 168 hours. Coagulation Profile: No results for input(s): INR, PROTIME in the last 168 hours. Cardiac Enzymes: Recent Labs  Lab 05/09/2019 0640  CKTOTAL 16*   BNP (last 3 results) No results for input(s): PROBNP in the last 8760 hours. HbA1C: No results for input(s): HGBA1C in the last 72 hours. CBG: Recent Labs  Lab 05/27/2019 0027 05/14/2019 0414 05/14/2019 0806 06/02/2019 1153 06/05/2019 1629  GLUCAP 135* 134* 119* 131* 134*   Lipid Profile: No results for input(s): CHOL, HDL, LDLCALC, TRIG, CHOLHDL, LDLDIRECT in the last 72 hours. Thyroid Function Tests: No results for input(s): TSH, T4TOTAL, FREET4, T3FREE, THYROIDAB in the last 72 hours. Anemia Panel: No results for input(s): VITAMINB12, FOLATE, FERRITIN, TIBC, IRON, RETICCTPCT in the last 72 hours. Urine analysis:    Component Value Date/Time   COLORURINE YELLOW (A) 05/28/2019 2230   APPEARANCEUR TURBID (A) 05/18/2019 2230   LABSPEC 1.023 05/28/2019 2230   PHURINE 5.0 05/24/2019 2230   GLUCOSEU NEGATIVE 06/04/2019 2230   HGBUR SMALL (A) 06/05/2019 2230   BILIRUBINUR NEGATIVE 05/30/2019 2230  KETONESUR NEGATIVE 05/25/2019 2230   PROTEINUR 100 (A) 05/22/2019 2230   NITRITE NEGATIVE 05/25/2019 2230   LEUKOCYTESUR MODERATE (A) 05/16/2019 2230   Sepsis Labs: @LABRCNTIP (procalcitonin:4,lacticidven:4) ) Recent Results (from the past 240 hour(s))  CULTURE, BLOOD (ROUTINE X 2) w Reflex to ID Panel     Status: Abnormal   Collection Time: 05/13/19  2:20 PM   Specimen: BLOOD  Result Value Ref Range Status   Specimen Description   Final    BLOOD BLOOD  RIGHT HAND Performed at [facility], [address] [place], [place] [address]    Special Requests   Final    BOTTLES DRAWN AEROBIC AND ANAEROBIC Blood Culture adequate volume Performed at [facility], [address] [place], [place] [address]    Culture  Setup Time   Final    GRAM POSITIVE COCCI ANAEROBIC BOTTLE ONLY CRITICAL RESULT CALLED TO, READ BACK BY AND VERIFIED WITHCurt Jews AT 8841 05/14/19 SDR Performed at [facility], [address] [place], [place] [address]    Culture (A)  Final    STAPHYLOCOCCUS AUREUS SUSCEPTIBILITIES PERFORMED ON PREVIOUS CULTURE WITHIN THE LAST 5 DAYS. Performed at [facility], [place] [address] [place], [place] [address]    Report Status 05/16/2019 FINAL  Final  CULTURE, BLOOD (ROUTINE X 2) w Reflex to ID Panel     Status: None   Collection Time: 05/13/19  3:39 PM   Specimen: BLOOD  Result Value Ref Range Status   Specimen Description BLOOD BLOOD LEFT HAND  Final   Special Requests   Final    BOTTLES DRAWN AEROBIC ONLY Blood Culture adequate volume   Culture   Final    NO GROWTH 5 DAYS Performed at [facility], [address] [place], [place] [address]    Report Status 05/18/2019 FINAL  Final  CULTURE, BLOOD (ROUTINE X 2) w Reflex to ID Panel     Status: Abnormal (Preliminary result)   Collection Time: 05/14/19  3:33 PM   Specimen: BLOOD  Result Value Ref Range Status   Specimen Description   Final    BLOOD BLOOD RIGHT HAND Performed at [facility], [address], [place], [place] [address]    Special Requests   Final    BOTTLES DRAWN AEROBIC AND ANAEROBIC Blood Culture adequate volume Performed at [facility], [address] [place], [place] [address]    Culture  Setup Time   Final    GRAM POSITIVE COCCI IN CLUSTERS ANAEROBIC BOTTLE ONLY CRITICAL VALUE NOTED.  VALUE IS CONSISTENT WITH PREVIOUSLY REPORTED AND CALLED VALUE.    Culture (A)  Final     STAPHYLOCOCCUS AUREUS Sent to [place] for further susceptibility testing. Performed at [facility], [place] [address], [place], [place] [address]    Report Status PENDING  Incomplete  Cath Tip Culture     Status: None   Collection Time: 05/14/19  4:24 PM   Specimen: Catheter Tip; Other  Result Value Ref Range Status   Specimen Description   Final    CATH TIP Performed at [facility], [address] [place], [place] [address]    Special Requests   Final    NONE Performed at [facility], [address], [place], [place] [address]    Culture   Final    NO GROWTH 3 DAYS Performed at [facility], [place] [address] [place], [place] [address]    Report Status 05/17/2019  FINAL  Final  CULTURE, BLOOD (ROUTINE X 2) w Reflex to ID Panel     Status: Abnormal   Collection Time: 05/14/19  6:25 PM   Specimen: BLOOD  Result Value Ref Range Status   Specimen Description   Final    BLOOD BLOOD LEFT HAND Performed at [facility], [address] [place], [place] [address]    Special Requests   Final    BOTTLES DRAWN AEROBIC AND ANAEROBIC Blood Culture results may not be optimal due to an inadequate volume of blood received in culture bottles Performed at [facility], [address] [place], [place] [address]    Culture  Setup Time   Final    GRAM POSITIVE COCCI ANAEROBIC BOTTLE ONLY CRITICAL VALUE NOTED.  VALUE IS CONSISTENT WITH PREVIOUSLY REPORTED AND CALLED VALUE. [place] Performed at [facility], [address] [place], [place] [address]    Culture (A)  Final    STAPHYLOCOCCUS AUREUS SUSCEPTIBILITIES PERFORMED ON PREVIOUS CULTURE WITHIN THE LAST 5 DAYS. Performed at [facility], [place] [address], [place], [place] [address]    Report Status 05/17/2019 FINAL  Final  Culture, blood (Routine X 2) w Reflex to ID Panel     Status: None   Collection Time: 05/16/19 10:45 AM   Specimen: BLOOD  Result Value Ref Range Status    Specimen Description BLOOD RIGHT HAND  Final   Special Requests BLOOD Blood Culture adequate volume  Final   Culture   Final    NO GROWTH 5 DAYS Performed at [facility], [address] [place], [place] [address]    Report Status 05/24/2019 FINAL  Final  Culture, blood (Routine X 2) w Reflex to ID Panel     Status: None   Collection Time: 05/16/19 12:38 PM   Specimen: BLOOD  Result Value Ref Range Status   Specimen Description BLOOD RT HAND  Final   Special Requests   Final    BOTTLES DRAWN AEROBIC AND ANAEROBIC Blood Culture adequate volume   Culture   Final    NO GROWTH 5 DAYS Performed at [facility], [address] [place], [place] [address]    Report Status 05/23/2019 FINAL  Final         Radiology Studies: DG Chest Port 1 View  Result Date: 05/08/2019 CLINICAL DATA:  Hypoxia EXAM: PORTABLE CHEST 1 VIEW COMPARISON:  Film from the previous day FINDINGS: Cardiac shadow is enlarged. Aortic calcifications are again seen. Changes of vascular congestion with parenchymal edema are noted increased when compared with the prior study. Dialysis catheter is noted on the left stable in appearance. No sizable effusion is noted at this time. No bony abnormality is seen. IMPRESSION: Increasing vascular congestion and pulmonary edema. Electronically Signed   By: Inez Catalina M.D.   On: 06/02/2019 19:18      Scheduled Meds: . sodium chloride   Intravenous Once  . allopurinol  100 mg Oral QPM  . amLODipine  10 mg Oral Daily  . vitamin C  500 mg Oral BID  . atorvastatin  10 mg Oral q1800  . brimonidine  1 drop Both Eyes Q12H   And  . timolol  1 drop Both Eyes Q12H  . carvedilol  25 mg Oral BID WC  . [START ON 05/23/2019] Chlorhexidine Gluconate Cloth  6 each Topical Daily  . feeding supplement (NEPRO CARB STEADY)  237 mL Oral BID BM  . insulin aspart  0-15 Units Subcutaneous  TID WC  . insulin aspart  0-5 Units Subcutaneous QHS  . latanoprost  1 drop Both Eyes  QPM  . levETIRAcetam  1,000 mg Oral QPM  . midodrine  5 mg Oral Q M,W,F  . mirtazapine  7.5 mg Oral QHS  . multivitamin  1 tablet Oral QHS  . pantoprazole (PROTONIX) IV  40 mg Intravenous QHS  . sertraline  50 mg Oral Daily   Continuous Infusions: . sodium chloride 50 mL (06/02/2019 1833)  . ceFTAROline (TEFLARO) IV 200 mg (05/14/2019 1243)  . DAPTOmycin (CUBICIN)  IV 500 mg (05/18/2019 2116)     LOS: 12 days      Debbe Odea, MD [facility] Pager: [email or web address] 06/02/2019, 4:51 PM

## 2019-05-22 NOTE — Progress Notes (Signed)
Occupational Therapy Treatment Patient Details Name: Crystal Vargas MRN: [id number] DOB: 06/25/1945 Today's Date: 05/23/2019    History of present illness 74 year old who has been followed by [facility] nephrology and initiated dialysis in January.  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 Infected HD port; admitted with septic shock, MRSA bacteremia.   OT comments  Pt seen for OT treatment this date to increase tolerance for ADL/bed mobility/seated self care. Pt requires TOTAL A (2 person assis) for bed mobility sup<>sit. Some grimacing with attempts to assist in mobilization of R LE throughout session, but difficult to ascertain which spot specifically. Pt demos F static sitting balance EOB with MIN A to maintain d/t L lateral lean. OT engages pt in face washing while seated EOB with MIN/MOD A and MIN verbal/tactile cues for initiation. Pt sitting >10 minutes during session. In bed, pt able to participate in oral care with MIN A and only one initiation cue, potentially demonstrating familiarity with task from last session/improvement in ADL participation tolerance this session. Anticipate that SNF is still most appropriate d/c recommendation.    Follow Up Recommendations  SNF;Supervision/Assistance - 24 hour    Equipment Recommendations  Other (comment)(defer to next level of care)    Recommendations for Other Services      Precautions / Restrictions Precautions Precautions: Fall Restrictions Other Position/Activity Restrictions: Pt with chronic CVA, L arm/leg flaccid       Mobility Bed Mobility Overal bed mobility: Needs Assistance Bed Mobility: Supine to Sit;Sit to Supine;Rolling Rolling: Total assist;+2 for physical assistance   Supine to sit: Total assist;+2 for physical assistance;HOB elevated Sit to supine: Total assist;+2 for physical assistance   General bed mobility  comments: pt able to reach for bed rails to assist when cued, able to move RLE towards EOB.  Transfers Overall transfer level: Needs assistance               General transfer comment: deferred due to safety concerns    Balance Overall balance assessment: Needs assistance Sitting-balance support: Feet unsupported;Single extremity supported Sitting balance-Leahy Scale: Poor Sitting balance - Comments: occasionally able to maintain sitting balance with minA, with fatigue modA, L lateral lean noted. Postural control: Left lateral lean                                 ADL either performed or assessed with clinical judgement   ADL Overall ADL's : Needs assistance/impaired       Grooming Details (indicate cue type and reason): Pt performs oral care bed level with MIN A/MIN verbal/tactile cues with 2 oral swabs, one initiation cue. Pt requires MIN/MOD A to wash face seated EOB-MIN A of one person to maintain static sitting balance, MIN/MOD A with R elbow support to reach face to engage in task 2/2 gross weakness.                                     Vision   Additional Comments: Pt appears to have decreased visual attention to her L side. Primarily attentive/responsive if therapist is in R hemisphere of vision. Difficult to formally assess.   Perception     Praxis      Cognition Arousal/Alertness: Lethargic;Awake/alert(pt gradually more awake/alert over course of session. Takes increased  time to acclimate/awaken.) Behavior During Therapy: Flat affect Overall Cognitive Status: Difficult to assess                                 General Comments: Pt with intermittent speech during session, clear at end of session to say "yes ma'am", otherwise soft spoken, able to answer most questions appropriately/Follow 75% simple commands with extended time/cues        Exercises General Exercises - Lower Extremity Ankle Circles/Pumps: PROM;10  reps;Both Heel Slides: AROM;Right;10 reps;Strengthening Hip ABduction/ADduction: AAROM;Right;5 reps;Strengthening   Shoulder Instructions       General Comments      Pertinent Vitals/ Pain       Pain Assessment: Faces Faces Pain Scale: Hurts even more Pain Location: with movement of RLE Pain Descriptors / Indicators: Grimacing;Guarding Pain Intervention(s): Limited activity within patient's tolerance;Monitored during session;Repositioned  Home Living                                          Prior Functioning/Environment              Frequency  Min 2X/week        Progress Toward Goals  OT Goals(current goals can now be found in the care plan section)  Progress towards OT goals: Progressing toward goals  Acute Rehab OT Goals Patient Stated Goal: to have pt overall feel better OT Goal Formulation: With family Time For Goal Achievement: 05/24/2019 Potential to Achieve Goals: Fair  Plan      Co-evaluation    PT/OT/SLP Co-Evaluation/Treatment: Yes Reason for Co-Treatment: Complexity of the patient's impairments (multi-system involvement);For patient/therapist safety;To address functional/ADL transfers PT goals addressed during session: Mobility/safety with mobility;Balance OT goals addressed during session: ADL's and self-care      AM-PAC OT "6 Clicks" Daily Activity     Outcome Measure   Help from another person eating meals?: A Little(NPO, demos ROM required to participate in hand to mouth) Help from another person taking care of personal grooming?: A Little Help from another person toileting, which includes using toliet, bedpan, or urinal?: Total Help from another person bathing (including washing, rinsing, drying)?: Total Help from another person to put on and taking off regular upper body clothing?: A Lot Help from another person to put on and taking off regular lower body clothing?: Total 6 Click Score: 11    End of Session    OT  Visit Diagnosis: Other abnormalities of gait and mobility (R26.89);Muscle weakness (generalized) (M62.81)   Activity Tolerance Patient limited by fatigue;Patient limited by lethargy   Patient Left in bed;with call bell/phone within reach;with bed alarm set;with family/visitor present   Nurse Communication          Time: 1779-3903 OT Time Calculation (min): 39 min  Charges: OT General Charges $OT Visit: 1 Visit OT Treatments $Self Care/Home Management : 23-37 mins  Gerrianne Scale, [place], OTR/L ascom [phone] 05/25/2019, 2:41 PM

## 2019-05-22 NOTE — Progress Notes (Signed)
Physical Therapy Treatment Patient Details Name: Crystal Vargas MRN: [id number] DOB: 20-Feb-1945 Today's Date: 06/02/2019    History of Present Illness 74 year old who has been followed by [facility] nephrology and initiated dialysis in January.  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 Infected HD port; admitted with septic shock, MRSA bacteremia.    PT Comments    Pt woken at start of session, initially lethargic but with time and repetitive cueing pt with eyes open, and clearer speech, normally soft spoken, able to say 1-2 words, appropriate. Pt able to move RLE, PROM for ankle pumps bilaterally, AROM heel slides, no noticeable muscle contraction of LLE. Improved ability to move RUE compared to previous session, AAROM for full shoulder flexion, AROM for elbow flexion/extension. Supine <> sit total x2, pt exhibited pain signs/symptoms, indicated her R leg hurt, and her buttocks hurt intermittently during session. Patient had a 1-2 instances of ability to maintain sitting balance with minA, with fatigue modA to maintain and L lateral lean noted. Rolling totalAx2 to reposition linens. The patient would benefit from further skilled PT intervention to continue to progress towards goals. Recommendation remains appropriate.       Follow Up Recommendations  SNF;Supervision/Assistance - 24 hour     Equipment Recommendations  None recommended by PT    Recommendations for Other Services       Precautions / Restrictions Precautions Precautions: Fall Restrictions Other Position/Activity Restrictions: Pt with chronic CVA, L arm/leg flaccid    Mobility  Bed Mobility Overal bed mobility: Needs Assistance Bed Mobility: Supine to Sit;Sit to Supine;Rolling Rolling: Total assist;+2 for physical assistance   Supine to sit: Total assist;+2 for physical assistance;HOB elevated Sit to supine: Total  assist;+2 for physical assistance   General bed mobility comments: pt able to reach for bed rails to assist when cued, able to move RLE towards EOB.  Transfers Overall transfer level: Needs assistance               General transfer comment: deferred due to safety concerns  Ambulation/Gait             General Gait Details: deferred due to safety concerns   Stairs             Wheelchair Mobility    Modified Rankin (Stroke Patients Only)       Balance Overall balance assessment: Needs assistance Sitting-balance support: Feet unsupported;Single extremity supported Sitting balance-Leahy Scale: Poor Sitting balance - Comments: occasionally able to maintain sitting balance with minA, with fatigue modA, L lateral lean noted. Postural control: Left lateral lean                                  Cognition Arousal/Alertness: Lethargic;Awake/alert(mental status approved throughout session) Behavior During Therapy: Flat affect Overall Cognitive Status: Difficult to assess                                 General Comments: Pt with intermittent speech during session, clear at end of session to say "yes m'mam", otherwise soft spoken, able to answer questions appropriately      Exercises General Exercises - Lower Extremity Ankle Circles/Pumps: PROM;10 reps;Both Heel Slides: AROM;Right;10 reps;Strengthening Hip ABduction/ADduction: AAROM;Right;5 reps;Strengthening    General Comments  Pertinent Vitals/Pain Pain Assessment: Faces Faces Pain Scale: Hurts even more Pain Location: with movement of RLE Pain Intervention(s): Limited activity within patient's tolerance;Monitored during session;Repositioned    Home Living                      Prior Function            PT Goals (current goals can now be found in the care plan section) Progress towards PT goals: Progressing toward goals(slowly)    Frequency    Min  2X/week      PT Plan Current plan remains appropriate    Co-evaluation              AM-PAC PT "6 Clicks" Mobility   Outcome Measure  Help needed turning from your back to your side while in a flat bed without using bedrails?: Total Help needed moving from lying on your back to sitting on the side of a flat bed without using bedrails?: Total Help needed moving to and from a bed to a chair (including a wheelchair)?: Total Help needed standing up from a chair using your arms (e.g., wheelchair or bedside chair)?: Total Help needed to walk in hospital room?: Total Help needed climbing 3-5 steps with a railing? : Total 6 Click Score: 6    End of Session   Activity Tolerance: Patient limited by fatigue;Patient limited by lethargy Patient left: with bed alarm set;with call bell/phone within reach;in bed;with SCD's reapplied Nurse Communication: Mobility status PT Visit Diagnosis: Muscle weakness (generalized) (M62.81);Other abnormalities of gait and mobility (R26.89)     Time: 4818-5631 PT Time Calculation (min) (ACUTE ONLY): 38 min  Charges:  $Therapeutic Exercise: 8-22 mins                     Lieutenant Diego PT, DPT 11:19 AM,06/05/2019

## 2019-05-22 NOTE — Progress Notes (Signed)
[facility]  ROUNDING NOTE   Subjective:   Patient states she is doing fair Requiring oxygen Denies any acute pain  Objective:  Vital signs in last 24 hours:  Temp:  [97.9 F (36.6 C)-98.6 F (37 C)] 98.2 F (36.8 C) (04/15 2101) Pulse Rate:  [55-66] 55 (04/15 2101) Resp:  [16-35] 16 (04/15 2101) BP: (114-166)/(53-72) 114/53 (04/15 2101) SpO2:  [95 %-100 %] 96 % (04/15 2101)  Weight change:  Filed Weights   05/17/19 0500 05/18/19 0451 05/25/2019 1608  Weight: 80.1 kg 72 kg 72 kg    Intake/Output: I/O last 3 completed shifts: In: 759 [I.V.:260.2; IV Piggyback:498.8] Out: 0    Intake/Output this shift:  No intake/output data recorded.  Physical Exam: General: Ill appearing  Head: Normocephalic, atraumatic.   Eyes: Anicteric,   Neck: Supple,   Lungs:   Bilateral crackles, oxygen by nasal cannula  Heart: Regular  rhythm  Abdomen:  Soft, nontender   Extremities:  no peripheral edema. Feet in b.l soft supports  Neurologic: Left sided weakness.  Alert to self , answers few simple questions  Skin: No lesions  Access:  Left IJ PermCath    Basic Metabolic Panel: Recent Labs  Lab 05/16/19 0327 05/17/19 0423 05/18/19 0751 05/18/19 0751 05/12/2019 3536 05/24/2019 0624 05/12/2019 0722 05/08/2019 0640 05/18/2019 1540 06/02/2019 0032  NA 142   < > 145  --  145  --  148* 145  --  142  K 3.5   < > 3.5   < > 3.8  --  5.1 3.6 3.6 3.6  CL 108   < > 111  --  111  --  118* 111  --  106  CO2 26   < > 25  --  24  --  21* 25  --  23  GLUCOSE 269*   < > 101*  --  135*  --  120* 135*  --  140*  BUN 24*   < > 43*  --  47*  --  56* 35*  --  19  CREATININE 2.16*   < > 3.24*  --  3.66*  --  4.10* 2.92*  --  1.85*  CALCIUM 8.8*   < > 8.8*   < > 8.7*   < > 8.8* 8.2*  --  8.3*  PHOS 1.3*  --   --   --   --   --   --   --   --   --    < > = values in this interval not displayed.    Liver Function Tests: Recent Labs  Lab 05/17/19 0423 05/18/19 0751 05/16/2019 0624  05/18/2019 0722 05/30/2019 0640  AST 22 18 21 22 18   ALT 19 17 18 17 16   ALKPHOS 78 72 77 83 82  BILITOT 1.7* 1.5* 1.7* 1.7* 1.6*  PROT 4.9* 5.0* 5.0* 5.5* 5.5*  ALBUMIN 2.1* 2.0* 1.8* 1.9* 2.0*   No results for input(s): LIPASE, AMYLASE in the last 168 hours. No results for input(s): AMMONIA in the last 168 hours.  CBC: Recent Labs  Lab 05/17/19 0423 05/18/19 0751 05/09/2019 0624 05/08/2019 0928 05/11/2019 0640  WBC 9.2 9.5 9.3 7.8 9.2  HGB 7.8* 7.1* 6.5* 11.4* 10.1*  HCT 23.1* 20.7* 19.6* 34.2* 30.0*  MCV 94.3 93.7 94.7 90.0 90.4  PLT 48* 110* 134* 138* 137*    Cardiac Enzymes: Recent Labs  Lab 05/11/2019 0640  CKTOTAL 16*    BNP: Invalid input(s): POCBNP  CBG: Recent  Labs  Lab 05/12/2019 0414 05/25/2019 0806 06/01/2019 1153 05/13/2019 1629 05/18/2019 2124  GLUCAP 134* 119* 131* 134* 133*    Microbiology: Results for orders placed or performed during the hospital encounter of 06/01/2019  Blood culture (routine x 2)     Status: Abnormal   Collection Time: 05/18/2019 12:34 PM   Specimen: BLOOD  Result Value Ref Range Status   Specimen Description   Final    BLOOD R UP ARM Performed at [facility], [address], [place], [place] [address]    Special Requests   Final    BOTTLES DRAWN AEROBIC AND ANAEROBIC Blood Culture adequate volume Performed at [facility], [address] [place], [place] [address]    Culture  Setup Time   Final    Organism ID to follow IN [place] TO, READ BACK BY AND VERIFIED WITH: [place] ON 05/11/19 AT 0210 [facility] Performed at [facility], [address] [place], [place] [address]    Culture (A)  Final    STAPHYLOCOCCUS AUREUS SUSCEPTIBILITIES PERFORMED ON PREVIOUS CULTURE WITHIN THE LAST 5 DAYS. Performed at [facility], [place] [address] [place], [place] [address]    Report Status 05/13/2019 FINAL  Final  Blood Culture ID Panel  (Reflexed)     Status: Abnormal   Collection Time: 05/30/2019 12:34 PM  Result Value Ref Range Status   Enterococcus species NOT DETECTED NOT DETECTED Final   Listeria monocytogenes NOT DETECTED NOT DETECTED Final   Staphylococcus species DETECTED (A) NOT DETECTED Final    Comment: CRITICAL RESULT CALLED TO, READ BACK BY AND VERIFIED WITH: SCOTT HALL ON 05/11/19 AT 0210 [facility]    Staphylococcus aureus (BCID) DETECTED (A) NOT DETECTED Final    Comment: Methicillin (oxacillin)-resistant Staphylococcus aureus (MRSA). MRSA is predictably resistant to beta-lactam antibiotics (except ceftaroline). Preferred therapy is vancomycin unless clinically contraindicated. Patient requires contact precautions if  hospitalized. CRITICAL RESULT CALLED TO, READ BACK BY AND VERIFIED WITH: SCOTT HALL ON 05/11/19 AT 0210 [facility]    Methicillin resistance DETECTED (A) NOT DETECTED Final    Comment: CRITICAL RESULT CALLED TO, READ BACK BY AND VERIFIED WITH: SCOTT HALL ON 05/11/19 AT 0210 [place]    Streptococcus species NOT DETECTED NOT DETECTED Final   Streptococcus agalactiae NOT DETECTED NOT DETECTED Final   Streptococcus pneumoniae NOT DETECTED NOT DETECTED Final   Streptococcus pyogenes NOT DETECTED NOT DETECTED Final   Acinetobacter baumannii NOT DETECTED NOT DETECTED Final   Enterobacteriaceae species NOT DETECTED NOT DETECTED Final   Enterobacter cloacae complex NOT DETECTED NOT DETECTED Final   Escherichia coli NOT DETECTED NOT DETECTED Final   Klebsiella oxytoca NOT DETECTED NOT DETECTED Final   Klebsiella pneumoniae NOT DETECTED NOT DETECTED Final   Proteus species NOT DETECTED NOT DETECTED Final   Serratia marcescens NOT DETECTED NOT DETECTED Final   Haemophilus influenzae NOT DETECTED NOT DETECTED Final   Neisseria meningitidis NOT DETECTED NOT DETECTED Final   Pseudomonas aeruginosa NOT DETECTED NOT DETECTED Final   Candida albicans NOT DETECTED NOT DETECTED Final   Candida glabrata NOT DETECTED NOT DETECTED  Final   Candida krusei NOT DETECTED NOT DETECTED Final   Candida parapsilosis NOT DETECTED NOT DETECTED Final   Candida tropicalis NOT DETECTED NOT DETECTED Final    Comment: Performed at [facility], [address] [place], [place] [address]  Blood culture (routine x 2)     Status: Abnormal  Collection Time: 05/17/2019  1:24 PM   Specimen: BLOOD  Result Value Ref Range Status   Specimen Description   Final    BLOOD R LATREAL BICEP Performed at [facility], [address], [place], [place] [address]    Special Requests   Final    BOTTLES DRAWN AEROBIC AND ANAEROBIC Blood Culture adequate volume Performed at [facility], [address] [place], [place] [address]    Culture  Setup Time   Final    IN BOTH AEROBIC AND ANAEROBIC BOTTLES GRAM POSITIVE COCCI CRITICAL VALUE NOTED.  VALUE IS CONSISTENT WITH PREVIOUSLY REPORTED AND CALLED VALUE. Performed at [facility], [address] [place],  [address]    Culture METHICILLIN RESISTANT STAPHYLOCOCCUS AUREUS (A)  Final   Report Status 05/13/2019 FINAL  Final   Organism ID, Bacteria METHICILLIN RESISTANT STAPHYLOCOCCUS AUREUS  Final      Susceptibility   Methicillin resistant staphylococcus aureus - MIC*    CIPROFLOXACIN >=8 RESISTANT Resistant     ERYTHROMYCIN >=8 RESISTANT Resistant     GENTAMICIN <=0.5 SENSITIVE Sensitive     OXACILLIN >=4 RESISTANT Resistant     TETRACYCLINE <=1 SENSITIVE Sensitive     VANCOMYCIN <=0.5 SENSITIVE Sensitive     TRIMETH/SULFA >=320 RESISTANT Resistant     CLINDAMYCIN <=0.25 SENSITIVE Sensitive     RIFAMPIN <=0.5 SENSITIVE Sensitive     Inducible Clindamycin NEGATIVE Sensitive     * METHICILLIN RESISTANT STAPHYLOCOCCUS AUREUS  Respiratory Panel by RT PCR (Flu A&B, Covid) - Nasopharyngeal Swab     Status: None   Collection Time: 05/25/2019  1:24 PM   Specimen: Nasopharyngeal Swab  Result Value Ref Range Status   SARS Coronavirus 2 by RT PCR NEGATIVE  NEGATIVE Final    Comment: (NOTE) SARS-CoV-2 target nucleic acids are NOT DETECTED. The SARS-CoV-2 RNA is generally detectable in upper respiratoy specimens during the acute phase of infection. The lowest concentration of SARS-CoV-2 viral copies this assay can detect is 131 copies/mL. A negative result does not preclude SARS-Cov-2 infection and should not be used as the sole basis for treatment or other patient management decisions. A negative result may occur with  improper specimen collection/handling, submission of specimen other than nasopharyngeal swab, presence of viral mutation(s) within the areas targeted by this assay, and inadequate number of viral copies (<131 copies/mL). A negative result must be combined with clinical observations, patient history, and epidemiological information. The expected result is Negative. Fact Sheet for Patients:  PinkCheek.be Fact Sheet for Healthcare Providers:  GravelBags.it This test is not yet ap proved or cleared by the [place] FDA and  has been authorized for detection and/or diagnosis of SARS-CoV-2 by FDA under an Emergency Use Authorization (EUA). This EUA will remain  in effect (meaning this test can be used) for the duration of the COVID-19 declaration under Section 564(b)(1) of the Act, 21 U.S.C. section 360bbb-3(b)(1), unless the authorization is terminated or revoked sooner.    Influenza A by PCR NEGATIVE NEGATIVE Final   Influenza B by PCR NEGATIVE NEGATIVE Final    Comment: (NOTE) The Xpert Xpress SARS-CoV-2/FLU/RSV assay is intended as an aid in  the diagnosis of influenza from Nasopharyngeal swab specimens and  should not be used as a sole basis for treatment. Nasal washings and  aspirates are unacceptable for Xpert Xpress SARS-CoV-2/FLU/RSV  testing. Fact Sheet for Patients: PinkCheek.be Fact Sheet for Healthcare  Providers: GravelBags.it This test is not yet approved or cleared by the [place]  FDA and  has been authorized for detection and/or diagnosis of SARS-CoV-2 by  FDA under an Emergency Use Authorization (EUA). This EUA will remain  in effect (meaning this test can be used) for the duration of the  Covid-19 declaration under Section 564(b)(1) of the Act, 21  U.S.C. section 360bbb-3(b)(1), unless the authorization is  terminated or revoked. Performed at [facility], [address] [place], [place] [address]   MRSA PCR Screening     Status: None   Collection Time: 06/06/2019  6:27 PM   Specimen: Nasopharyngeal  Result Value Ref Range Status   MRSA by PCR NEGATIVE NEGATIVE Final    Comment:        The GeneXpert MRSA Assay (FDA approved for NASAL specimens only), is one component of a comprehensive MRSA colonization surveillance program. It is not intended to diagnose MRSA infection nor to guide or monitor treatment for MRSA infections. Performed at [facility], [address] [place], [place] [address]   Urine culture     Status: Abnormal   Collection Time: 05/14/2019 10:30 PM   Specimen: Urine, Clean Catch  Result Value Ref Range Status   Specimen Description   Final    URINE, CLEAN CATCH Performed at [facility], [address] [place], [place] [address]    Special Requests   Final    NONE Performed at [facility], [place], [place] [address]    Culture (A)  Final    >=100,000 COLONIES/mL KLEBSIELLA PNEUMONIAE >=100,000 COLONIES/mL VANCOMYCIN RESISTANT ENTEROCOCCUS    Report Status 05/14/2019 FINAL  Final   Organism ID, Bacteria KLEBSIELLA PNEUMONIAE (A)  Final   Organism ID, Bacteria VANCOMYCIN RESISTANT ENTEROCOCCUS (A)  Final      Susceptibility   Klebsiella pneumoniae - MIC*    AMPICILLIN >=32 RESISTANT Resistant     CEFAZOLIN <=4 SENSITIVE Sensitive     CEFTRIAXONE <=0.25  SENSITIVE Sensitive     CIPROFLOXACIN <=0.25 SENSITIVE Sensitive     GENTAMICIN <=1 SENSITIVE Sensitive     IMIPENEM 0.5 SENSITIVE Sensitive     NITROFURANTOIN 64 INTERMEDIATE Intermediate     TRIMETH/SULFA <=20 SENSITIVE Sensitive     AMPICILLIN/SULBACTAM 4 SENSITIVE Sensitive     PIP/TAZO <=4 SENSITIVE Sensitive     * >=100,000 COLONIES/mL KLEBSIELLA PNEUMONIAE   Vancomycin resistant enterococcus - MIC*    AMPICILLIN >=32 RESISTANT Resistant     NITROFURANTOIN 256 RESISTANT Resistant     VANCOMYCIN >=32 RESISTANT Resistant     LINEZOLID 2 SENSITIVE Sensitive     * >=100,000 COLONIES/mL VANCOMYCIN RESISTANT ENTEROCOCCUS  Cath Tip Culture     Status: Abnormal   Collection Time: 05/11/19  1:10 PM   Specimen: Catheter Tip; Other  Result Value Ref Range Status   Specimen Description   Final    CATH TIP Performed at [facility], [address] [place], [place] [address]    Special Requests   Final    NONE Performed at [facility], [address] [place], [place] [address]    Culture (A)  Final    >=100,000 COLONIES/mL METHICILLIN RESISTANT STAPHYLOCOCCUS AUREUS   Report Status 05/13/2019 FINAL  Final   Organism ID, Bacteria METHICILLIN RESISTANT STAPHYLOCOCCUS AUREUS (A)  Final      Susceptibility   Methicillin resistant staphylococcus aureus - MIC*    CIPROFLOXACIN >=8 RESISTANT Resistant     ERYTHROMYCIN >=8 RESISTANT Resistant     GENTAMICIN <=0.5 SENSITIVE Sensitive  OXACILLIN >=4 RESISTANT Resistant     TETRACYCLINE <=1 SENSITIVE Sensitive     VANCOMYCIN 1 SENSITIVE Sensitive     TRIMETH/SULFA >=320 RESISTANT Resistant     CLINDAMYCIN <=0.25 SENSITIVE Sensitive     RIFAMPIN <=0.5 SENSITIVE Sensitive     Inducible Clindamycin NEGATIVE Sensitive     * >=100,000 COLONIES/mL METHICILLIN RESISTANT STAPHYLOCOCCUS AUREUS  Culture, blood (routine x 2)     Status: Abnormal   Collection Time: 05/11/19  4:27 PM   Specimen: BLOOD  Result Value Ref Range  Status   Specimen Description   Final    BLOOD A-LINE Performed at [facility], [address] [place], [place] [address]    Special Requests   Final    BOTTLES DRAWN AEROBIC AND ANAEROBIC Blood Culture adequate volume Performed at [facility], [address] [place], [place] [address]    Culture  Setup Time   Final    GRAM POSITIVE COCCI ANAEROBIC BOTTLE ONLY CRITICAL VALUE NOTED.  VALUE IS CONSISTENT WITH PREVIOUSLY REPORTED AND CALLED VALUE. Performed at [facility], [address] [place], [place] [address]    Culture (A)  Final    STAPHYLOCOCCUS AUREUS SUSCEPTIBILITIES PERFORMED ON PREVIOUS CULTURE WITHIN THE LAST 5 DAYS. Performed at [facility], [place] [address], [place], [place] [address]    Report Status 05/24/2019 FINAL  Final  Culture, blood (routine x 2)     Status: Abnormal   Collection Time: 05/12/19  6:06 AM   Specimen: BLOOD  Result Value Ref Range Status   Specimen Description   Final    BLOOD BLOOD RIGHT HAND Performed at [facility], [address], [place], [place] [address]    Special Requests   Final    BOTTLES DRAWN AEROBIC AND ANAEROBIC Blood Culture adequate volume Performed at [facility], [address] [place], [place] [address]    Culture  Setup Time   Final    GRAM POSITIVE COCCI ANAEROBIC BOTTLE ONLY CRITICAL RESULT CALLED TO, READ BACK BY AND VERIFIED WITH: DAVID BESANTI AT 10PM ON 05/12/2019 BY MOSLEY,J Performed at [facility], [address] [place], [place] [address]    Culture METHICILLIN RESISTANT STAPHYLOCOCCUS AUREUS (A)  Final   Report Status 06/01/2019 FINAL  Final   Organism ID, Bacteria METHICILLIN RESISTANT STAPHYLOCOCCUS AUREUS  Final      Susceptibility   Methicillin resistant staphylococcus aureus - MIC*    CIPROFLOXACIN >=8 RESISTANT Resistant     ERYTHROMYCIN >=8 RESISTANT Resistant     GENTAMICIN <=0.5 SENSITIVE Sensitive     OXACILLIN >=4  RESISTANT Resistant     TETRACYCLINE <=1 SENSITIVE Sensitive     VANCOMYCIN 1 SENSITIVE Sensitive     TRIMETH/SULFA >=320 RESISTANT Resistant     CLINDAMYCIN <=0.25 SENSITIVE Sensitive     RIFAMPIN <=0.5 SENSITIVE Sensitive     Inducible Clindamycin NEGATIVE Sensitive     * METHICILLIN RESISTANT STAPHYLOCOCCUS AUREUS  CULTURE, BLOOD (ROUTINE X 2) w Reflex to ID Panel     Status: Abnormal   Collection Time: 05/13/19  2:20 PM   Specimen: BLOOD  Result Value Ref Range Status   Specimen Description   Final    BLOOD BLOOD RIGHT HAND Performed at [facility], [address] [place], [place] [address]    Special Requests   Final    BOTTLES DRAWN AEROBIC AND ANAEROBIC Blood Culture adequate volume Performed at [facility], [address] [place], [place]  [address]    Culture  Setup Time   Final    GRAM POSITIVE COCCI ANAEROBIC BOTTLE ONLY CRITICAL RESULT CALLED TO, READ BACK BY AND VERIFIED WITH: [facility] AT 8338 05/14/19 SDR Performed at [facility], [address] [place], [place] [address]    Culture (A)  Final    STAPHYLOCOCCUS AUREUS SUSCEPTIBILITIES PERFORMED ON PREVIOUS CULTURE WITHIN THE LAST 5 DAYS. Performed at [facility], [place] [address], [place], [place] [address]    Report Status 05/16/2019 FINAL  Final  CULTURE, BLOOD (ROUTINE X 2) w Reflex to ID Panel     Status: None   Collection Time: 05/13/19  3:39 PM   Specimen: BLOOD  Result Value Ref Range Status   Specimen Description BLOOD BLOOD LEFT HAND  Final   Special Requests   Final    BOTTLES DRAWN AEROBIC ONLY Blood Culture adequate volume   Culture   Final    NO GROWTH 5 DAYS Performed at [facility], [address] [place], [place] [address]    Report Status 05/18/2019 FINAL  Final  CULTURE, BLOOD (ROUTINE X 2) w Reflex to ID Panel     Status: Abnormal (Preliminary result)   Collection Time: 05/14/19  3:33 PM   Specimen: BLOOD  Result Value Ref  Range Status   Specimen Description   Final    BLOOD BLOOD RIGHT HAND Performed at [facility], [address] [place], [place] [address]    Special Requests   Final    BOTTLES DRAWN AEROBIC AND ANAEROBIC Blood Culture adequate volume Performed at [facility], [address] [place], [place] [address]    Culture  Setup Time   Final    GRAM POSITIVE COCCI IN CLUSTERS ANAEROBIC BOTTLE ONLY CRITICAL VALUE NOTED.  VALUE IS CONSISTENT WITH PREVIOUSLY REPORTED AND CALLED VALUE.    Culture (A)  Final    STAPHYLOCOCCUS AUREUS Sent to [place] for further susceptibility testing. Performed at [facility], [place] [address] [place], [place] [address]    Report Status PENDING  Incomplete  Cath Tip Culture     Status: None   Collection Time: 05/14/19  4:24 PM   Specimen: Catheter Tip; Other  Result Value Ref Range Status   Specimen Description   Final    CATH TIP Performed at [facility], [address] [place], [place] [address]    Special Requests   Final    NONE Performed at [facility], [address], [place], [place] [address]    Culture   Final    NO GROWTH 3 DAYS Performed at [facility], [place] [address] [place], [place] [address]    Report Status 05/17/2019 FINAL  Final  CULTURE, BLOOD (ROUTINE X 2) w Reflex to ID Panel     Status: Abnormal   Collection Time: 05/14/19  6:25 PM   Specimen: BLOOD  Result Value Ref Range Status   Specimen Description   Final    BLOOD BLOOD LEFT HAND Performed at [facility], [address] [place], [place] [address]    Special Requests   Final    BOTTLES DRAWN AEROBIC AND ANAEROBIC Blood Culture results may not be optimal due to an inadequate volume of blood received in culture bottles Performed at [facility], [address] [place],  [address]    Culture  Setup Time   Final    GRAM POSITIVE COCCI ANAEROBIC BOTTLE ONLY CRITICAL  VALUE NOTED.  VALUE IS  CONSISTENT WITH PREVIOUSLY REPORTED AND CALLED VALUE. [place] Performed at [facility], [address] [place], [place] [address]    Culture (A)  Final    STAPHYLOCOCCUS AUREUS SUSCEPTIBILITIES PERFORMED ON PREVIOUS CULTURE WITHIN THE LAST 5 DAYS. Performed at [facility], [place] [address], [place], [place] [address]    Report Status 05/17/2019 FINAL  Final  Culture, blood (Routine X 2) w Reflex to ID Panel     Status: None   Collection Time: 05/16/19 10:45 AM   Specimen: BLOOD  Result Value Ref Range Status   Specimen Description BLOOD RIGHT HAND  Final   Special Requests BLOOD Blood Culture adequate volume  Final   Culture   Final    NO GROWTH 5 DAYS Performed at [facility], [address], [place], [place] [address]    Report Status 06/05/2019 FINAL  Final  Culture, blood (Routine X 2) w Reflex to ID Panel     Status: None   Collection Time: 05/16/19 12:38 PM   Specimen: BLOOD  Result Value Ref Range Status   Specimen Description BLOOD RT HAND  Final   Special Requests   Final    BOTTLES DRAWN AEROBIC AND ANAEROBIC Blood Culture adequate volume   Culture   Final    NO GROWTH 5 DAYS Performed at [facility] [place], [address] [place],  [address]    Report Status 05/12/2019 FINAL  Final    Coagulation Studies: No results for input(s): LABPROT, INR in the last 72 hours.  Urinalysis: No results for input(s): COLORURINE, LABSPEC, PHURINE, GLUCOSEU, HGBUR, BILIRUBINUR, KETONESUR, PROTEINUR, UROBILINOGEN, NITRITE, LEUKOCYTESUR in the last 72 hours.  Invalid input(s): APPERANCEUR    Imaging: DG Chest Port 1 View  Result Date: 05/17/2019 CLINICAL DATA:  Hypoxia EXAM: PORTABLE CHEST 1 VIEW COMPARISON:  Film from the previous day FINDINGS: Cardiac shadow is enlarged. Aortic calcifications are again seen. Changes of vascular congestion with parenchymal edema are noted increased when compared with the prior study. Dialysis catheter is  noted on the left stable in appearance. No sizable effusion is noted at this time. No bony abnormality is seen. IMPRESSION: Increasing vascular congestion and pulmonary edema. Electronically Signed   By: Inez Catalina M.D.   On: 05/14/2019 19:18     Medications:   . sodium chloride 50 mL (05/31/2019 1833)  . ceFTAROline (TEFLARO) IV 200 mg (05/08/2019 1243)  . DAPTOmycin (CUBICIN)  IV 500 mg (05/08/2019 2020)   . sodium chloride   Intravenous Once  . allopurinol  100 mg Oral QPM  . amLODipine  10 mg Oral Daily  . vitamin C  500 mg Oral BID  . atorvastatin  10 mg Oral q1800  . brimonidine  1 drop Both Eyes Q12H   And  . timolol  1 drop Both Eyes Q12H  . carvedilol  25 mg Oral BID WC  . [START ON 05/23/2019] Chlorhexidine Gluconate Cloth  6 each Topical Daily  . feeding supplement (NEPRO CARB STEADY)  237 mL Oral BID BM  . insulin aspart  0-15 Units Subcutaneous TID WC  . insulin aspart  0-5 Units Subcutaneous QHS  . latanoprost  1 drop Both Eyes QPM  . levETIRAcetam  1,000 mg Oral QPM  . midodrine  5 mg Oral Q M,W,F  . mirtazapine  7.5 mg Oral QHS  . multivitamin  1 tablet Oral QHS  . pantoprazole (PROTONIX) IV  40 mg Intravenous QHS  .  sertraline  50 mg Oral Daily     so far Assessment/ Plan:  Ms. Toneshia Coello is a 74 y.o. black female Ms. Tyshawn Keel is a 74 y.o. black female with end stage renal disease on hemodialysis, CVA with left hemiparesis, hypertension, hyperlipidemia, GERD, glaucoma, diabetes mellitus type II, coronary artery disease, gout, seizure disorder who was admitted to [facility] on 05/29/2019 for Hypokalemia [E87.6] Shock ([place]) [R57.9] ESRD (end stage renal disease) ([place]) [N18.6] Hypotension, unspecified hypotension type [I95.9] Anemia, unspecified type [D64.9]  [facility] M[facility]  63.5kg  # End Stage Renal Disease:  Last hemodialysis was yesterday Next dialysis treatment planned for Friday with volume removal as tolerated  # Hypotension: with MRSA  bacteremia/sepsis Persistent positive blood cultures from 4/7.   Neg results from 4/9 so far - Continue vancomycin and daptomycin. As per ID recommendations from 4/9,  -Awaiting approval for daptomycin from the outpatient dialysis center -TEE planned when patient hemodynamically stable    #. Anemia with chronic kidney disease:   Subcu EPO on 4/7.  Lab Results  Component Value Date   HGB 10.1 (L) 06/01/2019  Status post PRBC transfusion this admission  # Secondary Hyperparathyroidism:  Off binders at present Lab Results  Component Value Date   PTH 232 (H) 05/11/2019   CALCIUM 8.3 (L) 05/09/2019   PHOS 1.3 (L) 05/16/2019    #Malnutrition:   Currently on pureed diet  albumin 2.0 on 05/18/2019     LOS: 12 Gevork Ayyad 4/15/20219:44 PM

## 2019-05-22 NOTE — Progress Notes (Signed)
Contacted NP regarding pt's work of breathing, use of accessory muscles, and rhonchi lung sounds. NP came to assess pt and review chest xray. New orders placed and given. Will continue to closely monitor patient.

## 2019-05-22 NOTE — Anesthesia Postprocedure Evaluation (Signed)
Anesthesia Post Note  Patient: Crystal Vargas  Procedure(s) Performed: ESOPHAGOGASTRODUODENOSCOPY (EGD) (N/A )  Patient location during evaluation: PACU Anesthesia Type: General Level of consciousness: awake and alert Pain management: pain level controlled Vital Signs Assessment: post-procedure vital signs reviewed and stable Respiratory status: spontaneous breathing, nonlabored ventilation, respiratory function stable and patient connected to nasal cannula oxygen Cardiovascular status: blood pressure returned to baseline and stable Postop Assessment: no apparent nausea or vomiting Anesthetic complications: no     Last Vitals:  Vitals:   06/02/2019 2227 05/14/2019 0200  BP: 114/69   Pulse: 60   Resp: (!) 32 (!) 35  Temp: 37 C   SpO2: 96%     Last Pain:  Vitals:   05/29/2019 2227  TempSrc: Oral  PainSc:                  Molli Barrows

## 2019-05-22 NOTE — Progress Notes (Signed)
Speech Language Pathology Treatment: Dysphagia  Patient Details Name: Crystal Vargas MRN: [id number] DOB: 1945-12-07 Today's Date: 05/09/2019 Time: 8115-7262 SLP Time Calculation (min) (ACUTE ONLY): 50 min  Assessment / Plan / Recommendation Clinical Impression  Pt seen for ongoing assessment of swallowing; toleration of po diet w/ trials to upgrade diet(foods) as appropriate and safe for pt. NSG reported good toleration of current dysphagia diet but w/ only minimal amounts of po's taken at one time -- pt demonstrated such during the BSE. Dtr stated at BSE pt required a "feeding tube last time when she didn't eat enough". Pt awake this morning, smiled at SLP. Pt was verbal w/ SLP but w/ low volume of speech; quiet. Pt continues to be more verbal w/ SLP; speech stronger, clearer. However, pt exhibited "shut down" after only a few po trials stating she did not want anymore when asked to take more po's.  Pt positioned upright in bed for po's -- needed full support w/ this. Pt did not attempt to hold the cup; she stated she could not -- suspect she is totally dependent w/ feeding. Pt consumed trials of ice chips, sips of thin liquids via straw w/ no overt, clinical s/s of aspiration noted. With the trials of thin liquids, pt exhibited adequate oral prep attention and focus to take Single, Small sips - No impulsive drinking behavior. No change in vocal quality or overall respiratory status during/post trials. No coughing w/ trials. Pt then fed small TSP amounts of MINCED foods moistened; pt needed cues to open mouth more to take the TSP bolus -- pt tended to open lips minimally not accepting much of the bolus from the TSP. Pt only takes small amounts of po's at one time -- seems Guarded about wanting to open mouth to accept TSP amounts. Pt exhibited increased oral phase time for mastication of the increased texture but given TIME, and alternating foods/liquids, pt was able to clear orally. Oral checking was done  w/ trials. Total feeding support.  Pt appears to tolerate thin liquids for upgrade to Dysphagia level 1 w/ thin liquids diet. Pt has a h/o Esophageal phase dysmotility as well -- see chart notes; has a h/o Not eating well requiring a "feeding tube" per Dtr's report during previous CVA. Pt appears to have Cognitive decline and requires verbal cues and total support w/ oral intake. Recommend continue aspiration precautions; Ice Chips for Pleasure w/ aspiration precautions and NSG Supervision; feeding support w/ all oral intake/meals. Pills in Puree Crushed for ease of swallowing and Esophageal clearing. ST services will continue to monitor pt's status for trials to upgrade the food consistency in diet when appropriate. Recommend continue Dietician f/u for supplement support; Palliative Care consult for [place]. Noted discussion about poor oral intake overall and concern for need for alternative means of feeding(PEG). NSG updated.     HPI HPI: Pt is a 74 year old lifelong never smoker who has been followed by [facility] nephrology and initiated dialysis in January.  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  She has chronic issues with dysphagia due to Schatzki's ring and has been evaluated by GI and has had esophageal dilatation in the past.  Daughter also stated pt had a "Feeding Tube after her stroke because she wasn't eating enough".  The patient cannot provide reliable history due to altered mental status.  Daughter who is present with her states that she has had poor p.o. intake  due to dysphagia.  She currently resides in a facility in [place] due to needing skilled nursing care.  Pt had been declining x2 weeks, reported by speech therapy at [facility] where pt [facility].  Pt was admitted w/ septic shock and MRSA Bacteremia; MD noted Severe protein calorie malnutrition.  A Palliative Care consult is ordered for [place].       SLP Plan   Continue with current plan of care       Recommendations  Diet recommendations: Dysphagia 2 (fine chop);Thin liquid(gravies) Liquids provided via: Cup;Straw(monitor) Medication Administration: Crushed with puree(for safer swallowing) Supervision: Full supervision/cueing for compensatory strategies;Trained caregiver to feed patient Compensations: Minimize environmental distractions;Slow rate;Small sips/bites;Lingual sweep for clearance of pocketing;Multiple dry swallows after each bite/sip;Follow solids with liquid Postural Changes and/or Swallow Maneuvers: Seated upright 90 degrees;Upright 30-60 min after meal                General recommendations: (Dietician f/u; Palliative Care f/u) Oral Care Recommendations: Oral care BID;Oral care before and after PO;Staff/trained caregiver to provide oral care Follow up Recommendations: Skilled Nursing facility SLP Visit Diagnosis: Dysphagia, oropharyngeal phase (R13.12)(Cognitive decline) Plan: Continue with current plan of care       [place], [place], CCC-SLP Crystal Vargas 06/04/2019, 3:12 PM

## 2019-05-23 DIAGNOSIS — B9562 Methicillin resistant Staphylococcus aureus infection as the cause of diseases classified elsewhere: Secondary | ICD-10-CM | POA: Diagnosis not present

## 2019-05-23 DIAGNOSIS — J9601 Acute respiratory failure with hypoxia: Secondary | ICD-10-CM | POA: Diagnosis not present

## 2019-05-23 DIAGNOSIS — N186 End stage renal disease: Secondary | ICD-10-CM

## 2019-05-23 DIAGNOSIS — R7881 Bacteremia: Secondary | ICD-10-CM | POA: Diagnosis not present

## 2019-05-23 LAB — GLUCOSE, CAPILLARY
Glucose-Capillary: 72 mg/dL (ref 70–99)
Glucose-Capillary: 100 mg/dL — ABNORMAL HIGH (ref 70–99)
Glucose-Capillary: 83 mg/dL (ref 70–99)
Glucose-Capillary: 77 mg/dL (ref 70–99)
Glucose-Capillary: 104 mg/dL — ABNORMAL HIGH (ref 70–99)

## 2019-05-23 LAB — MISC LABCORP TEST (SEND OUT)
LabCorp test name: 2
Labcorp test code: 88005

## 2019-05-23 MED ORDER — EPOETIN ALFA 10000 UNIT/ML IJ SOLN
4000.0000 [IU] | INTRAMUSCULAR | Status: DC
  Administered 2019-05-30 – 2019-06-02 (×2): 4000 [IU] via INTRAVENOUS
  Filled 2019-05-23: qty 1

## 2019-05-23 MED ORDER — AMLODIPINE BESYLATE 5 MG PO TABS
2.5000 mg | ORAL_TABLET | Freq: Every day | ORAL | Status: DC
  Administered 2019-05-24 – 2019-05-30 (×6): 2.5 mg via ORAL
  Filled 2019-05-23 (×5): qty 1
  Filled 2019-05-23: qty 0.5
  Filled 2019-05-23 (×3): qty 1

## 2019-05-23 MED ORDER — EPOETIN ALFA 10000 UNIT/ML IJ SOLN: 4000.0000 [IU] | INTRAMUSCULAR | Status: DC

## 2019-05-23 MED ORDER — PANTOPRAZOLE SODIUM 40 MG PO TBEC
40.0000 mg | DELAYED_RELEASE_TABLET | Freq: Every day | ORAL | Status: DC
  Administered 2019-05-23 – 2019-06-01 (×8): 40 mg via ORAL
  Filled 2019-05-23 (×9): qty 1

## 2019-05-23 NOTE — Progress Notes (Signed)
ID   BP (!) 143/57 (BP Location: Right Arm)   Pulse 60   Temp 98.1 F (36.7 C) (Oral)   Resp (!) 24   Ht 5\' 2"  (1.575 m)   Wt 72.5 kg   SpO2 99%   BMI 29.23 kg/m   Awaiting TEE which was postponed due to Fluid overload Endoscopy showed  Benign-appearing esophageal stenosis. - Normal upper third of esophagus and middle third of esophagus. - Normal stomach. - Normal duodenal bulb and second portion of the duodenum.  MRSA bacteremia on daptomycin and ceftaroline ( as she failed vanco (inspite of MIC 1) Awaiting dapto clearance  For dialysis Will need both antibiotics for total of 6 weeks. ID will follow peripherally this weekend. Call if needed

## 2019-05-23 NOTE — Progress Notes (Signed)
PT Cancellation Note  Patient Details Name: Crystal Vargas MRN: [id number] DOB: March 22, 1945   Cancelled Treatment:    Reason Eval/Treat Not Completed: Other (comment)(Pt out of room at this time (HD). PT to follow up as able.)   Lieutenant Diego PT, DPT 10:25 AM,05/23/19

## 2019-05-23 NOTE — Progress Notes (Signed)
Hd completed 

## 2019-05-23 NOTE — TOC Progression Note (Signed)
Transition of Care [facility]) - Progression Note    Patient Details  Name: Crystal Vargas MRN: [id number] Date of Birth: [birth year]  Transition of Care [facility]) CM/SW Contact  Beverly Sessions, RN Phone Number: 05/23/2019, 2:24 PM  Clinical Narrative:     Per Audry Pili at [facility] patient can return to facility on ceftaroline.  Plan is if patient to receive Dapto outpatient at HD. Per Horald Chestnut the NP at the facility can 100% confirm on Monday.   RNCM notified MD that insurance auth would have to be obtained prior to discharge   Expected Discharge Plan: [place] Barriers to Discharge: Continued Medical Work up  Expected Discharge Plan and Services Expected Discharge Plan: [place] arrangements for the past 2 months: [place]                                       Social Determinants of Health (SDOH) Interventions    Readmission Risk Interventions No flowsheet data found.

## 2019-05-23 NOTE — Progress Notes (Signed)
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

## 2019-05-23 NOTE — Consult Note (Signed)
Pharmacy Antibiotic Note  Crystal Vargas is a 74 y.o. female admitted on 06/02/2019 with MRSA bacteremia due to an infected HD catheter.  Pharmacy has been consulted for daptomycin.  Patient receives hemodialysis on Monday, Wednesday and Friday. Dr Lucky Cowboy placed a PermCath 4/12. TEE has been delayed due to respiratory rate and low O2 sats. ID recommends a minimum of 6 weeks of IV antibiotics pending TEE results  Plan: Daptomycin: Continue daptomycin 500 mg (~8 mg/kg) IV q48 hours CK once weekly: next 4/21 (range since admission: 10 - 17 U/L)  Pharmacy will continue to dose and adjust per consult.   Height: 5\' 2"  (157.5 cm) Weight: 72.5 kg (159 lb 13.3 oz) IBW/kg (Calculated) : 50.1  Temp (24hrs), Avg:98 F (36.7 C), Min:97.7 F (36.5 C), Max:98.2 F (36.8 C)  Recent Labs  Lab 05/17/19 0423 05/17/19 0423 05/18/19 0751 06/02/2019 0624 05/28/2019 0722 05/25/2019 0928 05/11/2019 0640 05/13/2019 0032  WBC 9.2  --  9.5 9.3  --  7.8 9.2  --   CREATININE 2.66*   < > 3.24* 3.66* 4.10*  --  2.92* 1.85*   < > = values in this interval not displayed.    Estimated Creatinine Clearance: 24.1 mL/min (A) (by C-G formula based on SCr of 1.85 mg/dL (H)).    Allergies  Allergen Reactions  . Abacavir Other (See Comments)  . Cephalosporins Other (See Comments)  . Ciprofloxacin Other (See Comments)  . Nsaids Other (See Comments)  . Penicillin G Other (See Comments)  . Quinolones Other (See Comments)  . Salicylates Other (See Comments)    Antimicrobials this admission: vancomycin 4/3 >> 4/8 aztreonam 4/3 x 1 daptomycin 4/7 >> ceftaroline 4/12 >>  Microbiology results: 4/9 BCx: NG final 4/7 BCx: S aureus 4/7 Cath Tip: no growth final  4/6 Bcx: GPC 1/4 (anaerobic bottle) 4/4 Bcx:  Staph aureus (A-line) 4/5 Bcx:  Staph aureus 4/3 BCx: MRSA 4/4 Cath tip:  MRSA 4/3 UCx: Kleb - Likely contamination  4/3 MRSA PCR: negative 4/3 Flu A&B, Covid (nasopharyngeal swab):  negative  Thank you for allowing  pharmacy to be a part of this patient's care.  Dallie Piles 05/23/2019 7:04 AM

## 2019-05-23 NOTE — Progress Notes (Signed)
[facility]  ROUNDING NOTE   Subjective:   Patient seen during dialysis.  Tolerating well   HEMODIALYSIS FLOWSHEET:  Blood Flow Rate (mL/min): 280 mL/min Arterial Pressure (mmHg): -280 mmHg Venous Pressure (mmHg): 80 mmHg Transmembrane Pressure (mmHg): 50 mmHg Ultrafiltration Rate (mL/min): 800 mL/min Dialysate Flow Rate (mL/min): 600 ml/min Conductivity: Machine : 13.9 Conductivity: Machine : 13.9 Dialysis Fluid Bolus: Normal Saline Bolus Amount (mL): 250 mL  Not very interactive Nods yes or no to questions  Objective:  Vital signs in last 24 hours:  Temp:  [97.1 F (36.2 C)-98.2 F (36.8 C)] 97.1 F (36.2 C) (04/16 1234) Pulse Rate:  [39-58] 57 (04/16 1234) Resp:  [16-32] 32 (04/16 1234) BP: (99-177)/(48-74) 152/60 (04/16 1234) SpO2:  [96 %-100 %] 100 % (04/16 1234) Weight:  [72.5 kg] 72.5 kg (04/16 0500)  Weight change:  Filed Weights   05/18/19 0451 05/14/2019 1608 05/23/19 0500  Weight: 72 kg 72 kg 72.5 kg    Intake/Output: I/O last 3 completed shifts: In: 1601.2 [I.V.:60.2; IV [id number]:9735] Out: -    Intake/Output this shift:  Total I/O In: 54 [P.O.:54] Out: 2000 [Other:2000]  Physical Exam: General: Ill appearing  Head: Normocephalic, atraumatic.   Eyes: Anicteric,   Neck: Supple,   Lungs:   Mild bilateral crackles, oxygen by nasal cannula  Heart: Regular  rhythm  Abdomen:  Soft, nontender   Extremities:  no peripheral edema. Feet in b.l soft supports  Neurologic: Left sided weakness.  Nods yes or no.  Not very interactive  Skin: No lesions  Access:  Left IJ PermCath    Basic Metabolic Panel: Recent Labs  Lab 05/18/19 0751 05/18/19 0751 05/31/2019 0624 05/12/2019 0624 06/05/2019 0722 05/11/2019 0640 05/11/2019 1540 05/17/2019 0032  NA 145  --  145  --  148* 145  --  142  K 3.5   < > 3.8  --  5.1 3.6 3.6 3.6  CL 111  --  111  --  118* 111  --  106  CO2 25  --  24  --  21* 25  --  23  GLUCOSE 101*  --  135*  --  120* 135*  --   140*  BUN 43*  --  47*  --  56* 35*  --  19  CREATININE 3.24*  --  3.66*  --  4.10* 2.92*  --  1.85*  CALCIUM 8.8*   < > 8.7*   < > 8.8* 8.2*  --  8.3*   < > = values in this interval not displayed.    Liver Function Tests: Recent Labs  Lab 05/17/19 0423 05/18/19 0751 06/02/2019 0624 06/04/2019 0722 06/02/2019 0640  AST 22 18 21 22 18   ALT 19 17 18 17 16   ALKPHOS 78 72 77 83 82  BILITOT 1.7* 1.5* 1.7* 1.7* 1.6*  PROT 4.9* 5.0* 5.0* 5.5* 5.5*  ALBUMIN 2.1* 2.0* 1.8* 1.9* 2.0*   No results for input(s): LIPASE, AMYLASE in the last 168 hours. No results for input(s): AMMONIA in the last 168 hours.  CBC: Recent Labs  Lab 05/17/19 0423 05/18/19 0751 05/13/2019 0624 05/11/2019 0928 06/06/2019 0640  WBC 9.2 9.5 9.3 7.8 9.2  HGB 7.8* 7.1* 6.5* 11.4* 10.1*  HCT 23.1* 20.7* 19.6* 34.2* 30.0*  MCV 94.3 93.7 94.7 90.0 90.4  PLT 48* 110* 134* 138* 137*    Cardiac Enzymes: Recent Labs  Lab 06/04/2019 0640  CKTOTAL 16*    BNP: Invalid input(s): POCBNP  CBG: Recent Labs  Lab 05/08/2019 1153 05/27/2019 1629 05/11/2019 2124 05/23/19 0743 05/23/19 0941  GLUCAP 131* 134* 133* 72 100*    Microbiology: Results for orders placed or performed during the hospital encounter of 06/02/2019  Blood culture (routine x 2)     Status: Abnormal   Collection Time: 06/02/2019 12:34 PM   Specimen: BLOOD  Result Value Ref Range Status   Specimen Description   Final    BLOOD R UP ARM Performed at [facility], [address] [place], [place] [address]    Special Requests   Final    BOTTLES DRAWN AEROBIC AND ANAEROBIC Blood Culture adequate volume Performed at [facility], [address] [place], [place] [address]    Culture  Setup Time   Final    Organism ID to follow IN [place] TO, READ BACK BY AND VERIFIED WITH: [place] ON 05/11/19 AT 0210 [facility] [place] Performed at [facility], [address]  [place], [place] [address]    Culture (A)  Final    STAPHYLOCOCCUS AUREUS SUSCEPTIBILITIES PERFORMED ON PREVIOUS CULTURE WITHIN THE LAST 5 DAYS. Performed at [facility], [place] [address] [place], [place] [address]    Report Status 05/13/2019 FINAL  Final  Blood Culture ID Panel (Reflexed)     Status: Abnormal   Collection Time: 05/24/2019 12:34 PM  Result Value Ref Range Status   Enterococcus species NOT DETECTED NOT DETECTED Final   Listeria monocytogenes NOT DETECTED NOT DETECTED Final   Staphylococcus species DETECTED (A) NOT DETECTED Final    Comment: CRITICAL RESULT CALLED TO, READ BACK BY AND VERIFIED WITH: SCOTT HALL ON 05/11/19 AT 0210 [facility]    Staphylococcus aureus (BCID) DETECTED (A) NOT DETECTED Final    Comment: Methicillin (oxacillin)-resistant Staphylococcus aureus (MRSA). MRSA is predictably resistant to beta-lactam antibiotics (except ceftaroline). Preferred therapy is vancomycin unless clinically contraindicated. Patient requires contact precautions if  hospitalized. CRITICAL RESULT CALLED TO, READ BACK BY AND VERIFIED WITH: SCOTT HALL ON 05/11/19 AT 0210 [facility]    Methicillin resistance DETECTED (A) NOT DETECTED Final    Comment: CRITICAL RESULT CALLED TO, READ BACK BY AND VERIFIED WITH: SCOTT HALL ON 05/11/19 AT 0210 [place]    Streptococcus species NOT DETECTED NOT DETECTED Final   Streptococcus agalactiae NOT DETECTED NOT DETECTED Final   Streptococcus pneumoniae NOT DETECTED NOT DETECTED Final   Streptococcus pyogenes NOT DETECTED NOT DETECTED Final   Acinetobacter baumannii NOT DETECTED NOT DETECTED Final   Enterobacteriaceae species NOT DETECTED NOT DETECTED Final   Enterobacter cloacae complex NOT DETECTED NOT DETECTED Final   Escherichia coli NOT DETECTED NOT DETECTED Final   Klebsiella oxytoca NOT DETECTED NOT DETECTED Final   Klebsiella pneumoniae NOT DETECTED NOT DETECTED Final   Proteus species NOT DETECTED NOT DETECTED Final   Serratia marcescens NOT DETECTED NOT  DETECTED Final   Haemophilus influenzae NOT DETECTED NOT DETECTED Final   Neisseria meningitidis NOT DETECTED NOT DETECTED Final   Pseudomonas aeruginosa NOT DETECTED NOT DETECTED Final   Candida albicans NOT DETECTED NOT DETECTED Final   Candida glabrata NOT DETECTED NOT DETECTED Final   Candida krusei NOT DETECTED NOT DETECTED Final   Candida parapsilosis NOT DETECTED NOT DETECTED Final   Candida tropicalis NOT DETECTED NOT DETECTED Final    Comment: Performed at [facility], [address] [place], [place] [address]  Blood culture (routine x 2)     Status: Abnormal   Collection Time:  05/30/2019  1:24 PM   Specimen: BLOOD  Result Value Ref Range Status   Specimen Description   Final    BLOOD R LATREAL BICEP Performed at [facility], [address] [place], [place] [address]    Special Requests   Final    BOTTLES DRAWN AEROBIC AND ANAEROBIC Blood Culture adequate volume Performed at [facility], [address] [place], [place] [address]    Culture  Setup Time   Final    IN BOTH AEROBIC AND ANAEROBIC BOTTLES GRAM POSITIVE COCCI CRITICAL VALUE NOTED.  VALUE IS CONSISTENT WITH PREVIOUSLY REPORTED AND CALLED VALUE. Performed at [facility], [address] [place], [place] [address]    Culture METHICILLIN RESISTANT STAPHYLOCOCCUS AUREUS (A)  Final   Report Status 05/13/2019 FINAL  Final   Organism ID, Bacteria METHICILLIN RESISTANT STAPHYLOCOCCUS AUREUS  Final      Susceptibility   Methicillin resistant staphylococcus aureus - MIC*    CIPROFLOXACIN >=8 RESISTANT Resistant     ERYTHROMYCIN >=8 RESISTANT Resistant     GENTAMICIN <=0.5 SENSITIVE Sensitive     OXACILLIN >=4 RESISTANT Resistant     TETRACYCLINE <=1 SENSITIVE Sensitive     VANCOMYCIN <=0.5 SENSITIVE Sensitive     TRIMETH/SULFA >=320 RESISTANT Resistant     CLINDAMYCIN <=0.25 SENSITIVE Sensitive     RIFAMPIN <=0.5 SENSITIVE Sensitive     Inducible Clindamycin NEGATIVE  Sensitive     * METHICILLIN RESISTANT STAPHYLOCOCCUS AUREUS  Respiratory Panel by RT PCR (Flu A&B, Covid) - Nasopharyngeal Swab     Status: None   Collection Time: 05/31/2019  1:24 PM   Specimen: Nasopharyngeal Swab  Result Value Ref Range Status   SARS Coronavirus 2 by RT PCR NEGATIVE NEGATIVE Final    Comment: (NOTE) SARS-CoV-2 target nucleic acids are NOT DETECTED. The SARS-CoV-2 RNA is generally detectable in upper respiratoy specimens during the acute phase of infection. The lowest concentration of SARS-CoV-2 viral copies this assay can detect is 131 copies/mL. A negative result does not preclude SARS-Cov-2 infection and should not be used as the sole basis for treatment or other patient management decisions. A negative result may occur with  improper specimen collection/handling, submission of specimen other than nasopharyngeal swab, presence of viral mutation(s) within the areas targeted by this assay, and inadequate number of viral copies (<131 copies/mL). A negative result must be combined with clinical observations, patient history, and epidemiological information. The expected result is Negative. Fact Sheet for Patients:  PinkCheek.be Fact Sheet for Healthcare Providers:  GravelBags.it This test is not yet ap proved or cleared by the [place] FDA and  has been authorized for detection and/or diagnosis of SARS-CoV-2 by FDA under an Emergency Use Authorization (EUA). This EUA will remain  in effect (meaning this test can be used) for the duration of the COVID-19 declaration under Section 564(b)(1) of the Act, 21 U.S.C. section 360bbb-3(b)(1), unless the authorization is terminated or revoked sooner.    Influenza A by PCR NEGATIVE NEGATIVE Final   Influenza B by PCR NEGATIVE NEGATIVE Final    Comment: (NOTE) The Xpert Xpress SARS-CoV-2/FLU/RSV assay is intended as an aid in  the diagnosis of influenza from  Nasopharyngeal swab specimens and  should not be used as a sole basis for treatment. Nasal washings and  aspirates are unacceptable for Xpert Xpress SARS-CoV-2/FLU/RSV  testing. Fact Sheet for Patients: PinkCheek.be Fact Sheet for Healthcare Providers: GravelBags.it This test is not yet approved or cleared by the [place] and  has been authorized for detection and/or diagnosis of SARS-CoV-2 by  FDA under an Emergency Use Authorization (EUA). This EUA will remain  in effect (meaning this test can be used) for the duration of the  Covid-19 declaration under Section 564(b)(1) of the Act, 21  U.S.C. section 360bbb-3(b)(1), unless the authorization is  terminated or revoked. Performed at [facility], [address] [place], [place] [address]   MRSA PCR Screening     Status: None   Collection Time: 05/28/2019  6:27 PM   Specimen: Nasopharyngeal  Result Value Ref Range Status   MRSA by PCR NEGATIVE NEGATIVE Final    Comment:        The GeneXpert MRSA Assay (FDA approved for NASAL specimens only), is one component of a comprehensive MRSA colonization surveillance program. It is not intended to diagnose MRSA infection nor to guide or monitor treatment for MRSA infections. Performed at [facility], [address] [place], [place] [address]   Urine culture     Status: Abnormal   Collection Time: 06/01/2019 10:30 PM   Specimen: Urine, Clean Catch  Result Value Ref Range Status   Specimen Description   Final    URINE, CLEAN CATCH Performed at [facility], [address] [place], [place] [address]    Special Requests   Final    NONE Performed at [facility], [place], [place] [address]    Culture (A)  Final    >=100,000 COLONIES/mL KLEBSIELLA PNEUMONIAE >=100,000 COLONIES/mL VANCOMYCIN RESISTANT ENTEROCOCCUS    Report Status 05/14/2019 FINAL  Final    Organism ID, Bacteria KLEBSIELLA PNEUMONIAE (A)  Final   Organism ID, Bacteria VANCOMYCIN RESISTANT ENTEROCOCCUS (A)  Final      Susceptibility   Klebsiella pneumoniae - MIC*    AMPICILLIN >=32 RESISTANT Resistant     CEFAZOLIN <=4 SENSITIVE Sensitive     CEFTRIAXONE <=0.25 SENSITIVE Sensitive     CIPROFLOXACIN <=0.25 SENSITIVE Sensitive     GENTAMICIN <=1 SENSITIVE Sensitive     IMIPENEM 0.5 SENSITIVE Sensitive     NITROFURANTOIN 64 INTERMEDIATE Intermediate     TRIMETH/SULFA <=20 SENSITIVE Sensitive     AMPICILLIN/SULBACTAM 4 SENSITIVE Sensitive     PIP/TAZO <=4 SENSITIVE Sensitive     * >=100,000 COLONIES/mL KLEBSIELLA PNEUMONIAE   Vancomycin resistant enterococcus - MIC*    AMPICILLIN >=32 RESISTANT Resistant     NITROFURANTOIN 256 RESISTANT Resistant     VANCOMYCIN >=32 RESISTANT Resistant     LINEZOLID 2 SENSITIVE Sensitive     * >=100,000 COLONIES/mL VANCOMYCIN RESISTANT ENTEROCOCCUS  Cath Tip Culture     Status: Abnormal   Collection Time: 05/11/19  1:10 PM   Specimen: Catheter Tip; Other  Result Value Ref Range Status   Specimen Description   Final    CATH TIP Performed at [facility], [address] [place], [place] [address]    Special Requests   Final    NONE Performed at [facility], [address] [place], [place] [address]    Culture (A)  Final    >=100,000 COLONIES/mL METHICILLIN RESISTANT STAPHYLOCOCCUS AUREUS   Report Status 05/13/2019 FINAL  Final   Organism ID, Bacteria METHICILLIN RESISTANT STAPHYLOCOCCUS AUREUS (A)  Final      Susceptibility   Methicillin resistant staphylococcus aureus - MIC*    CIPROFLOXACIN >=8 RESISTANT Resistant     ERYTHROMYCIN >=8 RESISTANT Resistant     GENTAMICIN <=0.5 SENSITIVE Sensitive     OXACILLIN >=4  RESISTANT Resistant     TETRACYCLINE <=1 SENSITIVE Sensitive     VANCOMYCIN 1 SENSITIVE Sensitive     TRIMETH/SULFA >=320 RESISTANT Resistant     CLINDAMYCIN <=0.25 SENSITIVE Sensitive      RIFAMPIN <=0.5 SENSITIVE Sensitive     Inducible Clindamycin NEGATIVE Sensitive     * >=100,000 COLONIES/mL METHICILLIN RESISTANT STAPHYLOCOCCUS AUREUS  Culture, blood (routine x 2)     Status: Abnormal   Collection Time: 05/11/19  4:27 PM   Specimen: BLOOD  Result Value Ref Range Status   Specimen Description   Final    BLOOD A-LINE Performed at [facility], [address], [place], [place] [address]    Special Requests   Final    BOTTLES DRAWN AEROBIC AND ANAEROBIC Blood Culture adequate volume Performed at [facility], [address] [place], [place] [address]    Culture  Setup Time   Final    GRAM POSITIVE COCCI ANAEROBIC BOTTLE ONLY CRITICAL VALUE NOTED.  VALUE IS CONSISTENT WITH PREVIOUSLY REPORTED AND CALLED VALUE. Performed at [facility], [address] [place], [place] [address]    Culture (A)  Final    STAPHYLOCOCCUS AUREUS SUSCEPTIBILITIES PERFORMED ON PREVIOUS CULTURE WITHIN THE LAST 5 DAYS. Performed at [facility], [place] [address] [place], [place] [address]    Report Status 05/30/2019 FINAL  Final  Culture, blood (routine x 2)     Status: Abnormal   Collection Time: 05/12/19  6:06 AM   Specimen: BLOOD  Result Value Ref Range Status   Specimen Description   Final    BLOOD BLOOD RIGHT HAND Performed at [facility], [address], [place], [place] [address]    Special Requests   Final    BOTTLES DRAWN AEROBIC AND ANAEROBIC Blood Culture adequate volume Performed at [facility], [address] [place], [place] [address]    Culture  Setup Time   Final    GRAM POSITIVE COCCI ANAEROBIC BOTTLE ONLY CRITICAL RESULT CALLED TO, READ BACK BY AND VERIFIED WITH: [place] ON 05/12/2019 BY MOSLEY,J Performed at [facility], [address] [place], [place] [address]    Culture METHICILLIN RESISTANT STAPHYLOCOCCUS AUREUS (A)  Final   Report Status 05/26/2019 FINAL  Final   Organism ID,  Bacteria METHICILLIN RESISTANT STAPHYLOCOCCUS AUREUS  Final      Susceptibility   Methicillin resistant staphylococcus aureus - MIC*    CIPROFLOXACIN >=8 RESISTANT Resistant     ERYTHROMYCIN >=8 RESISTANT Resistant     GENTAMICIN <=0.5 SENSITIVE Sensitive     OXACILLIN >=4 RESISTANT Resistant     TETRACYCLINE <=1 SENSITIVE Sensitive     VANCOMYCIN 1 SENSITIVE Sensitive     TRIMETH/SULFA >=320 RESISTANT Resistant     CLINDAMYCIN <=0.25 SENSITIVE Sensitive     RIFAMPIN <=0.5 SENSITIVE Sensitive     Inducible Clindamycin NEGATIVE Sensitive     * METHICILLIN RESISTANT STAPHYLOCOCCUS AUREUS  CULTURE, BLOOD (ROUTINE X 2) w Reflex to ID Panel     Status: Abnormal   Collection Time: 05/13/19  2:20 PM   Specimen: BLOOD  Result Value Ref Range Status   Specimen Description   Final    BLOOD BLOOD RIGHT HAND Performed at [facility], [address] [place], [place] [address]    Special Requests   Final    BOTTLES DRAWN AEROBIC AND ANAEROBIC Blood Culture adequate volume Performed at [facility], [address] [place], [place] [address]  Culture  Setup Time   Final    GRAM POSITIVE COCCI ANAEROBIC BOTTLE ONLY CRITICAL RESULT CALLED TO, READ BACK BY AND VERIFIED WITH: [facility] KATSOUDAS AT 0960 05/14/19 [place] Performed at [facility], [address] [place], [place] [address]    Culture (A)  Final    STAPHYLOCOCCUS AUREUS SUSCEPTIBILITIES PERFORMED ON PREVIOUS CULTURE WITHIN THE LAST 5 DAYS. Performed at [facility], [place] [address], [place], [place] [address]    Report Status 05/16/2019 FINAL  Final  CULTURE, BLOOD (ROUTINE X 2) w Reflex to ID Panel     Status: None   Collection Time: 05/13/19  3:39 PM   Specimen: BLOOD  Result Value Ref Range Status   Specimen Description BLOOD BLOOD LEFT HAND  Final   Special Requests   Final    BOTTLES DRAWN AEROBIC ONLY Blood Culture adequate volume   Culture   Final    NO GROWTH 5 DAYS Performed at  [facility], [address] [place], [place] [address]    Report Status 05/18/2019 FINAL  Final  CULTURE, BLOOD (ROUTINE X 2) w Reflex to ID Panel     Status: Abnormal (Preliminary result)   Collection Time: 05/14/19  3:33 PM   Specimen: BLOOD  Result Value Ref Range Status   Specimen Description   Final    BLOOD BLOOD RIGHT HAND Performed at [facility], [address], [place], [place] [address]    Special Requests   Final    BOTTLES DRAWN AEROBIC AND ANAEROBIC Blood Culture adequate volume Performed at [facility], [address] [place], [place] [address]    Culture  Setup Time   Final    GRAM POSITIVE COCCI IN CLUSTERS ANAEROBIC BOTTLE ONLY CRITICAL VALUE NOTED.  VALUE IS CONSISTENT WITH PREVIOUSLY REPORTED AND CALLED VALUE.    Culture (A)  Final    STAPHYLOCOCCUS AUREUS Sent to [place] for further susceptibility testing. Performed at [facility], [place] [address], [place], [place] [address]    Report Status PENDING  Incomplete  Cath Tip Culture     Status: None   Collection Time: 05/14/19  4:24 PM   Specimen: Catheter Tip; Other  Result Value Ref Range Status   Specimen Description   Final    CATH TIP Performed at [facility], [address], [place], [place] [address]    Special Requests   Final    NONE Performed at [facility], [address] [place], [place] [address]    Culture   Final    NO GROWTH 3 DAYS Performed at [facility], [place] [address], [place], [place] [address]    Report Status 05/17/2019 FINAL  Final  CULTURE, BLOOD (ROUTINE X 2) w Reflex to ID Panel     Status: Abnormal   Collection Time: 05/14/19  6:25 PM   Specimen: BLOOD  Result Value Ref Range Status   Specimen Description   Final    BLOOD BLOOD LEFT HAND Performed at [facility], [address] [place], [place] [address]    Special Requests   Final    BOTTLES DRAWN AEROBIC AND ANAEROBIC Blood Culture  results may not be optimal due to an inadequate volume of blood received in culture bottles Performed at [facility], [address] [place], [place] [address]    Culture  Setup Time   Final    GRAM POSITIVE COCCI ANAEROBIC BOTTLE ONLY CRITICAL VALUE NOTED.  VALUE IS CONSISTENT WITH PREVIOUSLY REPORTED AND CALLED VALUE. [place] Performed at [facility], [address] [place], [place] [address]    Culture (A)  Final    STAPHYLOCOCCUS AUREUS SUSCEPTIBILITIES PERFORMED ON PREVIOUS CULTURE WITHIN THE LAST 5 DAYS. Performed at [facility], [place] [address] [place], [place] [address]    Report Status 05/17/2019 FINAL  Final  Culture, blood (Routine X 2) w Reflex to ID Panel     Status: None   Collection Time: 05/16/19 10:45 AM   Specimen: BLOOD  Result Value Ref Range Status   Specimen Description BLOOD RIGHT HAND  Final   Special Requests BLOOD Blood Culture adequate volume  Final   Culture   Final    NO GROWTH 5 DAYS Performed at [facility], [address], [place], [place] [address]    Report Status 05/18/2019 FINAL  Final  Culture, blood (Routine X 2) w Reflex to ID Panel     Status: None   Collection Time: 05/16/19 12:38 PM   Specimen: BLOOD  Result Value Ref Range Status   Specimen Description BLOOD RT HAND  Final   Special Requests   Final    BOTTLES DRAWN AEROBIC AND ANAEROBIC Blood Culture adequate volume   Culture   Final    NO GROWTH 5 DAYS Performed at [facility], [address] [place],  [address]    Report Status 05/14/2019 FINAL  Final    Coagulation Studies: No results for input(s): LABPROT, INR in the last 72 hours.  Urinalysis: No results for input(s): COLORURINE, LABSPEC, PHURINE, GLUCOSEU, HGBUR, BILIRUBINUR, KETONESUR, PROTEINUR, UROBILINOGEN, NITRITE, LEUKOCYTESUR in the last 72 hours.  Invalid input(s): APPERANCEUR    Imaging: DG Chest Port 1 View  Result Date: 05/31/2019 CLINICAL DATA:   Hypoxia EXAM: PORTABLE CHEST 1 VIEW COMPARISON:  Film from the previous day FINDINGS: Cardiac shadow is enlarged. Aortic calcifications are again seen. Changes of vascular congestion with parenchymal edema are noted increased when compared with the prior study. Dialysis catheter is noted on the left stable in appearance. No sizable effusion is noted at this time. No bony abnormality is seen. IMPRESSION: Increasing vascular congestion and pulmonary edema. Electronically Signed   By: Inez Catalina M.D.   On: 05/16/2019 19:18     Medications:   . sodium chloride 50 mL (05/08/2019 1833)  . ceFTAROline (TEFLARO) IV 200 mg (05/23/19 1306)  . DAPTOmycin (CUBICIN)  IV Stopped (05/09/2019 2050)   . sodium chloride   Intravenous Once  . allopurinol  100 mg Oral QPM  . [START ON 05/24/2019] amLODipine  2.5 mg Oral Daily  . vitamin C  500 mg Oral BID  . atorvastatin  10 mg Oral q1800  . brimonidine  1 drop Both Eyes Q12H   And  . timolol  1 drop Both Eyes Q12H  . carvedilol  25 mg Oral BID WC  . Chlorhexidine Gluconate Cloth  6 each Topical Daily  . feeding supplement (NEPRO CARB STEADY)  237 mL Oral BID BM  . insulin aspart  0-15 Units Subcutaneous TID WC  . insulin aspart  0-5 Units Subcutaneous QHS  . latanoprost  1 drop Both Eyes QPM  . levETIRAcetam  1,000 mg Oral QPM  . midodrine  5 mg Oral Q M,W,F  . mirtazapine  7.5 mg Oral QHS  . multivitamin  1 tablet Oral QHS  . pantoprazole  40 mg Oral QHS  . sertraline  50 mg Oral Daily  so far Assessment/ Plan:  Crystal Vargas is a 74 y.o. black female Crystal Vargas is a 75 y.o. black female with end stage renal disease on hemodialysis, CVA with left hemiparesis, hypertension, hyperlipidemia, GERD, glaucoma, diabetes mellitus type II, coronary artery disease, gout, seizure disorder who was admitted to [facility] on 06/05/2019 for Hypokalemia [E87.6] Shock ([place]) [R57.9] ESRD (end stage renal disease) ([place]) [N18.6] Hypotension, unspecified hypotension type  [I95.9] Anemia, unspecified type [D64.9]  [facility] MWF  63.5kg  # End Stage Renal Disease:  Patient seen during dialysis Tolerating well Ultrafiltration goal up to 2 L as tolerated  # Hypotension: with MRSA bacteremia/sepsis Persistent positive blood cultures from 4/7.   Neg results from 4/9 so far - Continue Teflaro and daptomycin. As per ID recommendations   -Awaiting approval for daptomycin from the outpatient dialysis center-[facility] -TEE planned when patient hemodynamically stable    #. Anemia with chronic kidney disease:  Lab Results  Component Value Date   HGB 10.1 (L) 06/04/2019  Status post PRBC transfusion this admission Ep[facility] with HD MWF schedule  # Secondary Hyperparathyroidism:  Off binders at present Lab Results  Component Value Date   PTH 232 (H) 05/11/2019   CALCIUM 8.3 (L) 06/01/2019   PHOS 1.3 (L) 05/16/2019    #Malnutrition:   Currently on pureed diet  albumin 2.0 on 05/18/2019     LOS: 13 Crystal Vargas 4/16/20213:21 PM

## 2019-05-23 NOTE — Progress Notes (Signed)
Hd started  

## 2019-05-24 DIAGNOSIS — R630 Anorexia: Secondary | ICD-10-CM | POA: Diagnosis not present

## 2019-05-24 DIAGNOSIS — R7881 Bacteremia: Secondary | ICD-10-CM | POA: Diagnosis not present

## 2019-05-24 DIAGNOSIS — N186 End stage renal disease: Secondary | ICD-10-CM | POA: Diagnosis not present

## 2019-05-24 DIAGNOSIS — J9601 Acute respiratory failure with hypoxia: Secondary | ICD-10-CM | POA: Diagnosis not present

## 2019-05-24 LAB — CULTURE, BLOOD (ROUTINE X 2): Special Requests: ADEQUATE

## 2019-05-24 LAB — GLUCOSE, CAPILLARY
Glucose-Capillary: 93 mg/dL (ref 70–99)
Glucose-Capillary: 108 mg/dL — ABNORMAL HIGH (ref 70–99)
Glucose-Capillary: 134 mg/dL — ABNORMAL HIGH (ref 70–99)
Glucose-Capillary: 164 mg/dL — ABNORMAL HIGH (ref 70–99)

## 2019-05-24 MED ORDER — NEPRO/CARBSTEADY PO LIQD
237.0000 mL | Freq: Four times a day (QID) | ORAL | Status: DC
  Administered 2019-05-24 (×2): 237 mL via ORAL
  Administered 2019-05-24 – 2019-05-25 (×2): 115 mL via ORAL
  Administered 2019-05-25 – 2019-05-29 (×4): 237 mL via ORAL

## 2019-05-24 MED ORDER — MEGESTROL ACETATE 400 MG/10ML PO SUSP
400.0000 mg | Freq: Two times a day (BID) | ORAL | Status: DC
  Administered 2019-05-24 – 2019-06-03 (×16): 400 mg via ORAL
  Filled 2019-05-24 (×22): qty 10

## 2019-05-24 NOTE — Progress Notes (Signed)
[facility]  ROUNDING NOTE   Subjective:  Patient remains quite weak. Continues dialysis on MWF schedule.   Objective:  Vital signs in last 24 hours:  Temp:  [97.7 F (36.5 C)-98.4 F (36.9 C)] 98.4 F (36.9 C) (04/17 1155) Pulse Rate:  [54-66] 66 (04/17 1627) Resp:  [20-24] 24 (04/17 1155) BP: (129-165)/(56-59) 129/56 (04/17 1627) SpO2:  [99 %-100 %] 100 % (04/17 1155) Weight:  [64.7 kg] 64.7 kg (04/17 0628)  Weight change: -7.817 kg Filed Weights   06/05/2019 1608 05/23/19 0500 05/24/19 0628  Weight: 72 kg 72.5 kg 64.7 kg    Intake/Output: I/O last 3 completed shifts: In: 76 [P.O.:54] Out: 2000 [Other:2000]   Intake/Output this shift:  No intake/output data recorded.  Physical Exam: General: Ill appearing  Head: Normocephalic, atraumatic.   Eyes: Anicteric  Neck: Supple  Lungs:  Clear bilateral, normal effort  Heart: Regular  rhythm  Abdomen:  Soft, nontender   Extremities: no peripheral edema. Feet in b.l soft supports  Neurologic: Lethargic, difficult to arouse today  Skin: No lesions  Access: Left IJ PermCath    Basic Metabolic Panel: Recent Labs  Lab 05/18/19 0751 05/18/19 0751 05/09/2019 0624 05/09/2019 0624 06/02/2019 0722 05/15/2019 0640 06/01/2019 1540 05/18/2019 0032  NA 145  --  145  --  148* 145  --  142  K 3.5   < > 3.8  --  5.1 3.6 3.6 3.6  CL 111  --  111  --  118* 111  --  106  CO2 25  --  24  --  21* 25  --  23  GLUCOSE 101*  --  135*  --  120* 135*  --  140*  BUN 43*  --  47*  --  56* 35*  --  19  CREATININE 3.24*  --  3.66*  --  4.10* 2.92*  --  1.85*  CALCIUM 8.8*   < > 8.7*   < > 8.8* 8.2*  --  8.3*   < > = values in this interval not displayed.    Liver Function Tests: Recent Labs  Lab 05/18/19 0751 06/02/2019 0624 06/05/2019 0722 05/26/2019 0640  AST 18 21 22 18   ALT 17 18 17 16   ALKPHOS 72 77 83 82  BILITOT 1.5* 1.7* 1.7* 1.6*  PROT 5.0* 5.0* 5.5* 5.5*  ALBUMIN 2.0* 1.8* 1.9* 2.0*   No results for input(s): LIPASE,  AMYLASE in the last 168 hours. No results for input(s): AMMONIA in the last 168 hours.  CBC: Recent Labs  Lab 05/18/19 0751 05/26/2019 0624 05/30/2019 0928 05/08/2019 0640  WBC 9.5 9.3 7.8 9.2  HGB 7.1* 6.5* 11.4* 10.1*  HCT 20.7* 19.6* 34.2* 30.0*  MCV 93.7 94.7 90.0 90.4  PLT 110* 134* 138* 137*    Cardiac Enzymes: Recent Labs  Lab 05/11/2019 0640  CKTOTAL 16*    BNP: Invalid input(s): POCBNP  CBG: Recent Labs  Lab 05/23/19 1644 05/23/19 2131 05/24/19 0739 05/24/19 1159 05/24/19 1630  GLUCAP 77 104* 93 108* 134*    Microbiology: Results for orders placed or performed during the hospital encounter of 05/08/2019  Blood culture (routine x 2)     Status: Abnormal   Collection Time: 05/09/2019 12:34 PM   Specimen: BLOOD  Result Value Ref Range Status   Specimen Description   Final    BLOOD R UP ARM Performed at [facility], [address], [place], [place] [address]    Special Requests   Final  BOTTLES DRAWN AEROBIC AND ANAEROBIC Blood Culture adequate volume Performed at [facility], [address] [place], [place] [address]    Culture  Setup Time   Final    Organism ID to follow IN BOTH AEROBIC AND ANAEROBIC BOTTLES GRAM POSITIVE COCCI CRITICAL RESULT CALLED TO, READ BACK BY AND VERIFIED WITH: [place] ON 05/11/19 AT 0210 [facility] Performed at [facility], [address] [place], [place] [address]    Culture (A)  Final    STAPHYLOCOCCUS AUREUS SUSCEPTIBILITIES PERFORMED ON PREVIOUS CULTURE WITHIN THE LAST 5 DAYS. Performed at [facility], [place] [address], [place], [place] [address]    Report Status 05/13/2019 FINAL  Final  Blood Culture ID Panel (Reflexed)     Status: Abnormal   Collection Time: 05/14/2019 12:34 PM  Result Value Ref Range Status   Enterococcus species NOT DETECTED NOT DETECTED Final   Listeria monocytogenes NOT DETECTED NOT DETECTED Final   Staphylococcus species DETECTED (A) NOT DETECTED Final    Comment:  CRITICAL RESULT CALLED TO, READ BACK BY AND VERIFIED WITH: SCOTT HALL ON 05/11/19 AT 0210 [facility]    Staphylococcus aureus (BCID) DETECTED (A) NOT DETECTED Final    Comment: Methicillin (oxacillin)-resistant Staphylococcus aureus (MRSA). MRSA is predictably resistant to beta-lactam antibiotics (except ceftaroline). Preferred therapy is vancomycin unless clinically contraindicated. Patient requires contact precautions if  hospitalized. CRITICAL RESULT CALLED TO, READ BACK BY AND VERIFIED WITH: SCOTT HALL ON 05/11/19 AT 0210 [facility]    Methicillin resistance DETECTED (A) NOT DETECTED Final    Comment: CRITICAL RESULT CALLED TO, READ BACK BY AND VERIFIED WITH: SCOTT HALL ON 05/11/19 AT 0210 [place]    Streptococcus species NOT DETECTED NOT DETECTED Final   Streptococcus agalactiae NOT DETECTED NOT DETECTED Final   Streptococcus pneumoniae NOT DETECTED NOT DETECTED Final   Streptococcus pyogenes NOT DETECTED NOT DETECTED Final   Acinetobacter baumannii NOT DETECTED NOT DETECTED Final   Enterobacteriaceae species NOT DETECTED NOT DETECTED Final   Enterobacter cloacae complex NOT DETECTED NOT DETECTED Final   Escherichia coli NOT DETECTED NOT DETECTED Final   Klebsiella oxytoca NOT DETECTED NOT DETECTED Final   Klebsiella pneumoniae NOT DETECTED NOT DETECTED Final   Proteus species NOT DETECTED NOT DETECTED Final   Serratia marcescens NOT DETECTED NOT DETECTED Final   Haemophilus influenzae NOT DETECTED NOT DETECTED Final   Neisseria meningitidis NOT DETECTED NOT DETECTED Final   Pseudomonas aeruginosa NOT DETECTED NOT DETECTED Final   Candida albicans NOT DETECTED NOT DETECTED Final   Candida glabrata NOT DETECTED NOT DETECTED Final   Candida krusei NOT DETECTED NOT DETECTED Final   Candida parapsilosis NOT DETECTED NOT DETECTED Final   Candida tropicalis NOT DETECTED NOT DETECTED Final    Comment: Performed at [facility], [address] [place], [place] [address]  Blood culture (routine x 2)      Status: Abnormal   Collection Time: 06/02/2019  1:24 PM   Specimen: BLOOD  Result Value Ref Range Status   Specimen Description   Final    BLOOD R LATREAL BICEP Performed at [facility], [address], [place], [place] [address]    Special Requests   Final    BOTTLES DRAWN AEROBIC AND ANAEROBIC Blood Culture adequate volume Performed at [facility], [address] [place], [place] [address]    Culture  Setup Time   Final    IN BOTH AEROBIC AND ANAEROBIC BOTTLES GRAM POSITIVE COCCI CRITICAL VALUE NOTED.  VALUE IS  CONSISTENT WITH PREVIOUSLY REPORTED AND CALLED VALUE. Performed at [facility], [address] [place], [place] [address]    Culture METHICILLIN RESISTANT STAPHYLOCOCCUS AUREUS (A)  Final   Report Status 05/13/2019 FINAL  Final   Organism ID, Bacteria METHICILLIN RESISTANT STAPHYLOCOCCUS AUREUS  Final      Susceptibility   Methicillin resistant staphylococcus aureus - MIC*    CIPROFLOXACIN >=8 RESISTANT Resistant     ERYTHROMYCIN >=8 RESISTANT Resistant     GENTAMICIN <=0.5 SENSITIVE Sensitive     OXACILLIN >=4 RESISTANT Resistant     TETRACYCLINE <=1 SENSITIVE Sensitive     VANCOMYCIN <=0.5 SENSITIVE Sensitive     TRIMETH/SULFA >=320 RESISTANT Resistant     CLINDAMYCIN <=0.25 SENSITIVE Sensitive     RIFAMPIN <=0.5 SENSITIVE Sensitive     Inducible Clindamycin NEGATIVE Sensitive     * METHICILLIN RESISTANT STAPHYLOCOCCUS AUREUS  Respiratory Panel by RT PCR (Flu A&B, Covid) - Nasopharyngeal Swab     Status: None   Collection Time: 05/08/2019  1:24 PM   Specimen: Nasopharyngeal Swab  Result Value Ref Range Status   SARS Coronavirus 2 by RT PCR NEGATIVE NEGATIVE Final    Comment: (NOTE) SARS-CoV-2 target nucleic acids are NOT DETECTED. The SARS-CoV-2 RNA is generally detectable in upper respiratoy specimens during the acute phase of infection. The lowest concentration of SARS-CoV-2 viral copies this assay can detect is 131  copies/mL. A negative result does not preclude SARS-Cov-2 infection and should not be used as the sole basis for treatment or other patient management decisions. A negative result may occur with  improper specimen collection/handling, submission of specimen other than nasopharyngeal swab, presence of viral mutation(s) within the areas targeted by this assay, and inadequate number of viral copies (<131 copies/mL). A negative result must be combined with clinical observations, patient history, and epidemiological information. The expected result is Negative. Fact Sheet for Patients:  PinkCheek.be Fact Sheet for Healthcare Providers:  GravelBags.it This test is not yet ap proved or cleared by the [place] FDA and  has been authorized for detection and/or diagnosis of SARS-CoV-2 by FDA under an Emergency Use Authorization (EUA). This EUA will remain  in effect (meaning this test can be used) for the duration of the COVID-19 declaration under Section 564(b)(1) of the Act, 21 U.S.C. section 360bbb-3(b)(1), unless the authorization is terminated or revoked sooner.    Influenza A by PCR NEGATIVE NEGATIVE Final   Influenza B by PCR NEGATIVE NEGATIVE Final    Comment: (NOTE) The Xpert Xpress SARS-CoV-2/FLU/RSV assay is intended as an aid in  the diagnosis of influenza from Nasopharyngeal swab specimens and  should not be used as a sole basis for treatment. Nasal washings and  aspirates are unacceptable for Xpert Xpress SARS-CoV-2/FLU/RSV  testing. Fact Sheet for Patients: PinkCheek.be Fact Sheet for Healthcare Providers: GravelBags.it This test is not yet approved or cleared by the [place] FDA and  has been authorized for detection and/or diagnosis of SARS-CoV-2 by  FDA under an Emergency Use Authorization (EUA). This EUA will remain  in effect (meaning this test can  be used) for the duration of the  Covid-19 declaration under Section 564(b)(1) of the Act, 21  U.S.C. section 360bbb-3(b)(1), unless the authorization is  terminated or revoked. Performed at [facility], [address] [place], [place] [address]   MRSA PCR Screening     Status: None   Collection Time: 05/11/2019  6:27 PM   Specimen: Nasopharyngeal  Result Value Ref Range Status  MRSA by PCR NEGATIVE NEGATIVE Final    Comment:        The GeneXpert MRSA Assay (FDA approved for NASAL specimens only), is one component of a comprehensive MRSA colonization surveillance program. It is not intended to diagnose MRSA infection nor to guide or monitor treatment for MRSA infections. Performed at [facility], [address] [place], [place] [address]   Urine culture     Status: Abnormal   Collection Time: 05/16/2019 10:30 PM   Specimen: Urine, Clean Catch  Result Value Ref Range Status   Specimen Description   Final    URINE, CLEAN CATCH Performed at [facility], [address], [place], [place] [address]    Special Requests   Final    NONE Performed at [facility], [place], [place] [address]    Culture (A)  Final    >=100,000 COLONIES/mL KLEBSIELLA PNEUMONIAE >=100,000 COLONIES/mL VANCOMYCIN RESISTANT ENTEROCOCCUS    Report Status 05/14/2019 FINAL  Final   Organism ID, Bacteria KLEBSIELLA PNEUMONIAE (A)  Final   Organism ID, Bacteria VANCOMYCIN RESISTANT ENTEROCOCCUS (A)  Final      Susceptibility   Klebsiella pneumoniae - MIC*    AMPICILLIN >=32 RESISTANT Resistant     CEFAZOLIN <=4 SENSITIVE Sensitive     CEFTRIAXONE <=0.25 SENSITIVE Sensitive     CIPROFLOXACIN <=0.25 SENSITIVE Sensitive     GENTAMICIN <=1 SENSITIVE Sensitive     IMIPENEM 0.5 SENSITIVE Sensitive     NITROFURANTOIN 64 INTERMEDIATE Intermediate     TRIMETH/SULFA <=20 SENSITIVE Sensitive     AMPICILLIN/SULBACTAM 4 SENSITIVE Sensitive     PIP/TAZO <=4  SENSITIVE Sensitive     * >=100,000 COLONIES/mL KLEBSIELLA PNEUMONIAE   Vancomycin resistant enterococcus - MIC*    AMPICILLIN >=32 RESISTANT Resistant     NITROFURANTOIN 256 RESISTANT Resistant     VANCOMYCIN >=32 RESISTANT Resistant     LINEZOLID 2 SENSITIVE Sensitive     * >=100,000 COLONIES/mL VANCOMYCIN RESISTANT ENTEROCOCCUS  Cath Tip Culture     Status: Abnormal   Collection Time: 05/11/19  1:10 PM   Specimen: Catheter Tip; Other  Result Value Ref Range Status   Specimen Description   Final    CATH TIP Performed at [facility], [address] [place], [place] [address]    Special Requests   Final    NONE Performed at [facility], [address] [place], [place] [address]    Culture (A)  Final    >=100,000 COLONIES/mL METHICILLIN RESISTANT STAPHYLOCOCCUS AUREUS   Report Status 05/13/2019 FINAL  Final   Organism ID, Bacteria METHICILLIN RESISTANT STAPHYLOCOCCUS AUREUS (A)  Final      Susceptibility   Methicillin resistant staphylococcus aureus - MIC*    CIPROFLOXACIN >=8 RESISTANT Resistant     ERYTHROMYCIN >=8 RESISTANT Resistant     GENTAMICIN <=0.5 SENSITIVE Sensitive     OXACILLIN >=4 RESISTANT Resistant     TETRACYCLINE <=1 SENSITIVE Sensitive     VANCOMYCIN 1 SENSITIVE Sensitive     TRIMETH/SULFA >=320 RESISTANT Resistant     CLINDAMYCIN <=0.25 SENSITIVE Sensitive     RIFAMPIN <=0.5 SENSITIVE Sensitive     Inducible Clindamycin NEGATIVE Sensitive     * >=100,000 COLONIES/mL METHICILLIN RESISTANT STAPHYLOCOCCUS AUREUS  Culture, blood (routine x 2)     Status: Abnormal   Collection Time: 05/11/19  4:27 PM   Specimen: BLOOD  Result Value Ref Range Status   Specimen Description   Final  BLOOD A-LINE Performed at [facility], [address], [place], [place] [address]    Special Requests   Final    BOTTLES DRAWN AEROBIC AND ANAEROBIC Blood Culture adequate volume Performed at [facility], [address]  [place], [place] [address]    Culture  Setup Time   Final    GRAM POSITIVE COCCI ANAEROBIC BOTTLE ONLY CRITICAL VALUE NOTED.  VALUE IS CONSISTENT WITH PREVIOUSLY REPORTED AND CALLED VALUE. Performed at [facility], [address] [place], [place] [address]    Culture (A)  Final    STAPHYLOCOCCUS AUREUS SUSCEPTIBILITIES PERFORMED ON PREVIOUS CULTURE WITHIN THE LAST 5 DAYS. Performed at [facility], [place] [address] [place], [place] [address]    Report Status 05/28/2019 FINAL  Final  Culture, blood (routine x 2)     Status: Abnormal   Collection Time: 05/12/19  6:06 AM   Specimen: BLOOD  Result Value Ref Range Status   Specimen Description   Final    BLOOD BLOOD RIGHT HAND Performed at [facility], [address] [place], [place] [address]    Special Requests   Final    BOTTLES DRAWN AEROBIC AND ANAEROBIC Blood Culture adequate volume Performed at [facility], [address] [place], [place] [address]    Culture  Setup Time   Final    GRAM POSITIVE COCCI ANAEROBIC BOTTLE ONLY CRITICAL RESULT CALLED TO, READ BACK BY AND VERIFIED WITH: [place] ON 05/12/2019 BY MOSLEY,J Performed at [facility], [address] [place], [place] [address]    Culture METHICILLIN RESISTANT STAPHYLOCOCCUS AUREUS (A)  Final   Report Status 05/14/2019 FINAL  Final   Organism ID, Bacteria METHICILLIN RESISTANT STAPHYLOCOCCUS AUREUS  Final      Susceptibility   Methicillin resistant staphylococcus aureus - MIC*    CIPROFLOXACIN >=8 RESISTANT Resistant     ERYTHROMYCIN >=8 RESISTANT Resistant     GENTAMICIN <=0.5 SENSITIVE Sensitive     OXACILLIN >=4 RESISTANT Resistant     TETRACYCLINE <=1 SENSITIVE Sensitive     VANCOMYCIN 1 SENSITIVE Sensitive     TRIMETH/SULFA >=320 RESISTANT Resistant     CLINDAMYCIN <=0.25 SENSITIVE Sensitive     RIFAMPIN <=0.5 SENSITIVE Sensitive     Inducible Clindamycin NEGATIVE Sensitive     * METHICILLIN RESISTANT  STAPHYLOCOCCUS AUREUS  CULTURE, BLOOD (ROUTINE X 2) w Reflex to ID Panel     Status: Abnormal   Collection Time: 05/13/19  2:20 PM   Specimen: BLOOD  Result Value Ref Range Status   Specimen Description   Final    BLOOD BLOOD RIGHT HAND Performed at [facility], [address], [place], [place] [address]    Special Requests   Final    BOTTLES DRAWN AEROBIC AND ANAEROBIC Blood Culture adequate volume Performed at [facility], [address] [place], [place] [address]    Culture  Setup Time   Final    GRAM POSITIVE COCCI ANAEROBIC BOTTLE ONLY CRITICAL RESULT CALLED TO, READ BACK BY AND VERIFIED WITH: [facility] KATSOUDAS AT 2694 05/14/19 SDR Performed at [facility], [address] [place], [place] [address]    Culture (A)  Final    STAPHYLOCOCCUS AUREUS SUSCEPTIBILITIES PERFORMED ON PREVIOUS CULTURE WITHIN THE LAST 5 DAYS. Performed at [facility], [place] [address], [place], [place] [address]    Report Status 05/16/2019 FINAL  Final  CULTURE, BLOOD (ROUTINE X 2) w Reflex to ID Panel  Status: None   Collection Time: 05/13/19  3:39 PM   Specimen: BLOOD  Result Value Ref Range Status   Specimen Description BLOOD BLOOD LEFT HAND  Final   Special Requests   Final    BOTTLES DRAWN AEROBIC ONLY Blood Culture adequate volume   Culture   Final    NO GROWTH 5 DAYS Performed at [facility], [address] [place], [place] [address]    Report Status 05/18/2019 FINAL  Final  CULTURE, BLOOD (ROUTINE X 2) w Reflex to ID Panel     Status: Abnormal   Collection Time: 05/14/19  3:33 PM   Specimen: BLOOD  Result Value Ref Range Status   Specimen Description   Final    BLOOD BLOOD RIGHT HAND Performed at [facility], [address], [place], [place] [address]    Special Requests   Final    BOTTLES DRAWN AEROBIC AND ANAEROBIC Blood Culture adequate volume Performed at [facility], [address] [place], [place]  [address]    Culture  Setup Time   Final    GRAM POSITIVE COCCI IN CLUSTERS ANAEROBIC BOTTLE ONLY CRITICAL VALUE NOTED.  VALUE IS CONSISTENT WITH PREVIOUSLY REPORTED AND CALLED VALUE.    Culture (A)  Final    STAPHYLOCOCCUS AUREUS SEE SEPARATE REPORT IN [facility] FOR DOS 05/14/19. Performed at [facility], [place] [address], [place], [place] [address]    Report Status 05/24/2019 FINAL  Final  Cath Tip Culture     Status: None   Collection Time: 05/14/19  4:24 PM   Specimen: Catheter Tip; Other  Result Value Ref Range Status   Specimen Description   Final    CATH TIP Performed at [facility], [address] [place], [place] [address]    Special Requests   Final    NONE Performed at [facility], [address] [place], [place] [address]    Culture   Final    NO GROWTH 3 DAYS Performed at [facility], [place] [address], [place], [place] [address]    Report Status 05/17/2019 FINAL  Final  CULTURE, BLOOD (ROUTINE X 2) w Reflex to ID Panel     Status: Abnormal   Collection Time: 05/14/19  6:25 PM   Specimen: BLOOD  Result Value Ref Range Status   Specimen Description   Final    BLOOD BLOOD LEFT HAND Performed at [facility], [address] [place], [place] [address]    Special Requests   Final    BOTTLES DRAWN AEROBIC AND ANAEROBIC Blood Culture results may not be optimal due to an inadequate volume of blood received in culture bottles Performed at [facility], [address], [place], [place] [address]    Culture  Setup Time   Final    GRAM POSITIVE COCCI ANAEROBIC BOTTLE ONLY CRITICAL VALUE NOTED.  VALUE IS CONSISTENT WITH PREVIOUSLY REPORTED AND CALLED VALUE. Page Performed at [facility], [address] [place],  [address]    Culture (A)  Final    STAPHYLOCOCCUS AUREUS SUSCEPTIBILITIES PERFORMED ON PREVIOUS CULTURE WITHIN THE LAST 5 DAYS. Performed at [facility], [place] [address], [place],   [address]    Report Status 05/17/2019 FINAL  Final  Culture, blood (Routine X 2) w Reflex to ID Panel     Status: None   Collection Time: 05/16/19 10:45 AM   Specimen: BLOOD  Result Value Ref Range Status   Specimen  Description BLOOD RIGHT HAND  Final   Special Requests BLOOD Blood Culture adequate volume  Final   Culture   Final    NO GROWTH 5 DAYS Performed at [facility], [address] [place], [place] [address]    Report Status 05/24/2019 FINAL  Final  Culture, blood (Routine X 2) w Reflex to ID Panel     Status: None   Collection Time: 05/16/19 12:38 PM   Specimen: BLOOD  Result Value Ref Range Status   Specimen Description BLOOD RT HAND  Final   Special Requests   Final    BOTTLES DRAWN AEROBIC AND ANAEROBIC Blood Culture adequate volume   Culture   Final    NO GROWTH 5 DAYS Performed at [facility], [address] [place], [place] [address]    Report Status 05/12/2019 FINAL  Final    Coagulation Studies: No results for input(s): LABPROT, INR in the last 72 hours.  Urinalysis: No results for input(s): COLORURINE, LABSPEC, PHURINE, GLUCOSEU, HGBUR, BILIRUBINUR, KETONESUR, PROTEINUR, UROBILINOGEN, NITRITE, LEUKOCYTESUR in the last 72 hours.  Invalid input(s): APPERANCEUR    Imaging: No results found.   Medications:   . sodium chloride 30 mL (05/24/19 1320)  . ceFTAROline (TEFLARO) IV 200 mg (05/24/19 1321)  . DAPTOmycin (CUBICIN)  IV Stopped (05/14/2019 2050)   . sodium chloride   Intravenous Once  . allopurinol  100 mg Oral QPM  . amLODipine  2.5 mg Oral Daily  . vitamin C  500 mg Oral BID  . atorvastatin  10 mg Oral q1800  . brimonidine  1 drop Both Eyes Q12H   And  . timolol  1 drop Both Eyes Q12H  . carvedilol  25 mg Oral BID WC  . Chlorhexidine Gluconate Cloth  6 each Topical Daily  . epoetin (EPOGEN/PROCRIT) injection  4,000 Units Intravenous Q M,W,F-HD  . feeding supplement (NEPRO CARB STEADY)  237 mL Oral QID  . insulin aspart   0-15 Units Subcutaneous TID WC  . insulin aspart  0-5 Units Subcutaneous QHS  . latanoprost  1 drop Both Eyes QPM  . levETIRAcetam  1,000 mg Oral QPM  . megestrol  400 mg Oral BID  . midodrine  5 mg Oral Q M,W,F  . mirtazapine  7.5 mg Oral QHS  . multivitamin  1 tablet Oral QHS  . pantoprazole  40 mg Oral QHS  . sertraline  50 mg Oral Daily     so far Assessment/ Plan:  [name] is a 74 y.o. black female [name] is a 74 y.o. black female with end stage renal disease on hemodialysis, CVA with left hemiparesis, hypertension, hyperlipidemia, GERD, glaucoma, diabetes mellitus type II, coronary artery disease, gout, seizure disorder who was admitted to [facility] on 06/05/2019 for Hypokalemia [E87.6] Shock ([place]) [R57.9] ESRD (end stage renal disease) ([place]) [N18.6] Hypotension, unspecified hypotension type [I95.9] Anemia, unspecified type [D64.9]  [facility] MWF  63.5kg  # End Stage Renal Disease:  Patient underwent dialysis yesterday.  No acute indication for dialysis treatment today.  # Hypotension: with MRSA bacteremia/sepsis Persistent positive blood cultures from 4/7.   Neg results from 4/9 so far - Continue Teflaro and daptomycin. As per ID recommendations   -Daptomycin approved on the outpatient basis.    #. Anemia with chronic kidney disease:  Lab Results  Component Value Date   HGB 10.1 (L) 05/31/2019  Continue Epogen 4000 IV with dialysis treatment.  # Secondary Hyperparathyroidism:  Off binders at present  Lab Results  Component Value Date   PTH 232 (H) 05/11/2019   CALCIUM 8.3 (L) 06/02/2019   PHOS 1.3 (L) 05/16/2019  Recheck phosphorus with next dialysis treatment.  #Malnutrition:   Currently on pureed diet  albumin 2.0 on 05/18/2019     LOS: 14 Trevious Rampey 4/17/20217:19 PM

## 2019-05-24 NOTE — Progress Notes (Signed)
Nutrition Brief Note RD working remotely.  Noted 48 hour calorie count was ordered. Updated order with calorie count instructions.  Please hang calorie count envelope on the patient's door. Document percent consumed for each item on the patient's meal tray ticket and keep in envelope. Also document percent of any supplement or snack pt consumes and keep documentation in envelope for RD to review.   RD will follow-up on 4/19 to document results of calorie count.  Jacklynn Barnacle, MS, RD, LDN Pager number available on Amion

## 2019-05-24 NOTE — Progress Notes (Addendum)
PROGRESS NOTE    Crystal Vargas   RKY:[id number]  DOB: October 16, 1945  DOA: 06/02/2019 PCP: Marisa Hua, MD   Brief Narrative:  Crystal Vargas is a 74 y.o. female who is bed bound and from SNF with history of end-stage renal disease recently started on hemodialysis, history of right MCA stroke, history of dysphagia due to Schatzki's ring and is status post esophageal dilatation who was admitted with altered mental status.  Her daughter noted poor PO intake thought to be due to dysphagia.  In the ED, she was hypotensive and had a K of 2.1 and a Hb of 6.9.  She was given IVF, IV Abx and admitted to the ICU.   Subjective: Continues to have poor appetite this AM. Only ate a few bites of breakfast. She states she likes the Nepro shakes and drinks them so I will increase them to QID. She states she does not want a feeding tube if her appetite does not increase. She is willing to try an appetite stimulant.      Assessment & Plan:   Principal Problem:   MRSA bacteremia/ septic shock - due to infected dialysis cath - appreciate ID assistance- currently on Daptomycin and Teflaro- awating approval for dapto - repeat cultures 4/9 negative - plan for TEE on Monday and then return to SNF  Active Problems: Dysphagia- chronic issue - EGD performed 4/14 revealed and esophageal stenosis that was dilated  - OK to proceed with TEE for above  Poor oral intake  eating < 50 % of meals  - 4/17> continue to barely eat meals- add Megace- increase Nepro- start calorie count - She does not want a feeding tube.   Acute hypoxic respiratory failure - CXR suggestive of pulmonary edema - on 3-4 L of O 2 with pulse ox in mid 90s - RR in high 20-low 30 range - Have asked cardiology to hold off on TTE in light of these findings - fluid removed with dialysis on 4/16- no longer hypoxic - contacted Dr Ubaldo Glassing as she is OK to proceed with TEE- he plans for Monday  ESRD on HD - infected cath removed and tunnelled HD  cath placed on 4/12 -cont dialysis per renal team - MWF dialysis with [facility]  H/o HTN - meds being titrated by Renal  Acute anemia (possible related to infection/ acute illness) in addition to CKD - has been transfused 3 U PRBC so far - 4/3 (1 U) and 4/12 ( 2 U)    Diabetes Mellitus 2 - cont moderate dose SSI - A1c checked on 4/3 was 5.6   Status post CVA  - bed bound with left sided weakness (0/5)and left facial droop  Seizure disorder - cont Keppra-    Mild thrombocytopenia - possible due to sepsis- follow  Depression?  - has a flat affect, poor appetite, barely eating but states she is not depressed - cont Remeron and Zoloft  Time spent in minutes: 35 DVT prophylaxis: SCDs Code Status: Full code Family Communication:  Disposition Plan: return to SNF eventually - currently on IV Abx for bacteremia-waiting to see if Dapto will be approved for outpt use- plan forTEE on Monday Consultants:  Nephro CArdiology GI ID Palliative care Procedures:   4/14- EGD Benign-appearing esophageal stenosis  4/12- left IJ  2 D ECHO 1. Left ventricular ejection fraction, by estimation, is 45 to 50%. The  left ventricle has mildly decreased function. The left ventricle has no  regional wall motion abnormalities. There is  moderate concentric left  ventricular hypertrophy. Left  ventricular diastolic parameters are consistent with Grade I diastolic  dysfunction (impaired relaxation).  2. Right ventricular systolic function is normal. The right ventricular  size is mildly enlarged. There is moderately elevated pulmonary artery  systolic pressure.  3. Left atrial size was mildly dilated.  4. Right atrial size was mildly dilated.  5. The mitral valve was not well visualized. Mild to moderate mitral  valve regurgitation.  6. Tricuspid valve regurgitation is moderate to severe.  7. The aortic valve is grossly normal. Aortic valve regurgitation is  trivial. No aortic  stenosis is present.  Antimicrobials:  Anti-infectives (From admission, onward)   Start     Dose/Rate Route Frequency Ordered Stop   06/05/2019 1645  clindamycin (CLEOCIN) IVPB 300 mg  Status:  Discontinued     300 mg 100 mL/hr over 30 Minutes Intravenous  Once 05/14/2019 1642 05/24/2019 1804   06/04/2019 1645  clindamycin (CLEOCIN) 300 MG/50ML IVPB    Note to Pharmacy: Genelle Bal   : cabinet override      05/14/2019 1645 05/17/2019 0459   05/12/2019 1600  ceftaroline (TEFLARO) 200 mg in sodium chloride 0.9 % 250 mL IVPB     200 mg 250 mL/hr over 60 Minutes Intravenous Every 8 hours 05/18/2019 1328     05/25/2019 1200  vancomycin (VANCOREADY) IVPB 750 mg/150 mL     750 mg 150 mL/hr over 60 Minutes Intravenous Every Thu (Hemodialysis) 05/24/2019 1126 05/17/2019 1809   05/14/19 2000  DAPTOmycin (CUBICIN) 500 mg in sodium chloride 0.9 % IVPB     500 mg 220 mL/hr over 30 Minutes Intravenous Every 48 hours 05/14/19 1550     05/12/19 1645  vancomycin (VANCOREADY) IVPB 750 mg/150 mL     750 mg 150 mL/hr over 60 Minutes Intravenous  Once 05/12/19 1635 05/12/19 1819   05/12/19 1358  vancomycin variable dose per unstable renal function (pharmacist dosing)  Status:  Discontinued      Does not apply See admin instructions 05/12/19 1358 06/04/2019 1120   05/12/19 1200  vancomycin (VANCOREADY) IVPB 500 mg/100 mL  Status:  Discontinued     500 mg 100 mL/hr over 60 Minutes Intravenous Every M-W-F (Hemodialysis) 06/06/2019 2016 05/17/2019 2018   05/12/19 1200  vancomycin (VANCOREADY) IVPB 750 mg/150 mL  Status:  Discontinued     750 mg 150 mL/hr over 60 Minutes Intravenous Every M-W-F (Hemodialysis) 05/31/2019 2018 05/12/19 1358   05/09/2019 2100  aztreonam (AZACTAM) 1 g in sodium chloride 0.9 % 100 mL IVPB  Status:  Discontinued     1 g 200 mL/hr over 30 Minutes Intravenous Every 24 hours 05/22/2019 2016 05/11/19 1019   05/16/2019 1500  aztreonam (AZACTAM) injection 2 g  Status:  Discontinued     2 g Intramuscular Once 05/09/2019  1411 05/23/2019 1442   05/31/2019 1445  aztreonam (AZACTAM) 2 g in sodium chloride 0.9 % 100 mL IVPB  Status:  Discontinued     2 g 200 mL/hr over 30 Minutes Intravenous  Once 05/15/2019 1442 05/09/2019 2016   05/25/2019 1430  vancomycin (VANCOREADY) IVPB 1250 mg/250 mL     1,250 mg 166.7 mL/hr over 90 Minutes Intravenous  Once 06/01/2019 1411 05/22/2019 1635       Objective: Vitals:   05/24/19 0628 05/24/19 0913 05/24/19 0915 05/24/19 1155  BP:   (!) 151/59 (!) 165/58  Pulse:  (!) 54  (!) 58  Resp:    (!) 24  Temp:  98.4 F (36.9 C)  TempSrc:    Oral  SpO2:    100%  Weight: 64.7 kg     Height:       No intake or output data in the 24 hours ending 05/24/19 1222 Filed Weights   05/14/2019 1608 05/23/19 0500 05/24/19 0628  Weight: 72 kg 72.5 kg 64.7 kg    Examination: General exam: Appears comfortable  HEENT: PERRLA, oral mucosa moist, no sclera icterus or thrush Respiratory system: Clear to auscultation. Respiratory effort normal. Cardiovascular system: S1 & S2 heard,  No murmurs  Gastrointestinal system: Abdomen soft, non-tender, nondistended. Normal bowel sounds   Central nervous system: Alert and oriented. Does not move left side, has left facial droop. Extremities: No cyanosis, clubbing or edema Skin: No rashes or ulcers Psychiatry:  very flat affect     Data Reviewed: I have personally reviewed following labs and imaging studies  CBC: Recent Labs  Lab 05/18/19 0751 05/12/2019 0624 05/16/2019 0928 06/05/2019 0640  WBC 9.5 9.3 7.8 9.2  HGB 7.1* 6.5* 11.4* 10.1*  HCT 20.7* 19.6* 34.2* 30.0*  MCV 93.7 94.7 90.0 90.4  PLT 110* 134* 138* 921*   Basic Metabolic Panel: Recent Labs  Lab 05/18/19 0751 05/18/19 0751 06/04/2019 0624 05/23/2019 0722 05/18/2019 0640 05/18/2019 1540 05/14/2019 0032  NA 145  --  145 148* 145  --  142  K 3.5   < > 3.8 5.1 3.6 3.6 3.6  CL 111  --  111 118* 111  --  106  CO2 25  --  24 21* 25  --  23  GLUCOSE 101*  --  135* 120* 135*  --  140*  BUN 43*   --  47* 56* 35*  --  19  CREATININE 3.24*  --  3.66* 4.10* 2.92*  --  1.85*  CALCIUM 8.8*  --  8.7* 8.8* 8.2*  --  8.3*   < > = values in this interval not displayed.   GFR: Estimated Creatinine Clearance: 22.8 mL/min (A) (by C-G formula based on SCr of 1.85 mg/dL (H)). Liver Function Tests: Recent Labs  Lab 05/18/19 0751 06/02/2019 0624 05/19/2019 0722 05/12/2019 0640  AST 18 21 22 18   ALT 17 18 17 16   ALKPHOS 72 77 83 82  BILITOT 1.5* 1.7* 1.7* 1.6*  PROT 5.0* 5.0* 5.5* 5.5*  ALBUMIN 2.0* 1.8* 1.9* 2.0*   No results for input(s): LIPASE, AMYLASE in the last 168 hours. No results for input(s): AMMONIA in the last 168 hours. Coagulation Profile: No results for input(s): INR, PROTIME in the last 168 hours. Cardiac Enzymes: Recent Labs  Lab 05/16/2019 0640  CKTOTAL 16*   BNP (last 3 results) No results for input(s): PROBNP in the last 8760 hours. HbA1C: No results for input(s): HGBA1C in the last 72 hours. CBG: Recent Labs  Lab 05/23/19 1230 05/23/19 1644 05/23/19 2131 05/24/19 0739 05/24/19 1159  GLUCAP 83 77 104* 93 108*   Lipid Profile: No results for input(s): CHOL, HDL, LDLCALC, TRIG, CHOLHDL, LDLDIRECT in the last 72 hours. Thyroid Function Tests: No results for input(s): TSH, T4TOTAL, FREET4, T3FREE, THYROIDAB in the last 72 hours. Anemia Panel: No results for input(s): VITAMINB12, FOLATE, FERRITIN, TIBC, IRON, RETICCTPCT in the last 72 hours. Urine analysis:    Component Value Date/Time   COLORURINE YELLOW (A) 06/01/2019 2230   APPEARANCEUR TURBID (A) 05/14/2019 2230   LABSPEC 1.023 05/09/2019 2230   PHURINE 5.0 05/21/2019 2230   GLUCOSEU NEGATIVE 06/02/2019 2230   HGBUR SMALL (  A) 05/09/2019 2230   BILIRUBINUR NEGATIVE 06/02/2019 2230   [place] 05/24/2019 2230   PROTEINUR 100 (A) 05/08/2019 2230   NITRITE NEGATIVE 06/02/2019 2230   LEUKOCYTESUR MODERATE (A) 05/23/2019 2230   Sepsis Labs: @LABRCNTIP (procalcitonin:4,lacticidven:4) ) Recent  Results (from the past 240 hour(s))  CULTURE, BLOOD (ROUTINE X 2) w Reflex to ID Panel     Status: Abnormal (Preliminary result)   Collection Time: 05/14/19  3:33 PM   Specimen: BLOOD  Result Value Ref Range Status   Specimen Description   Final    BLOOD BLOOD RIGHT HAND Performed at [facility], [address], [place], [place] [address]    Special Requests   Final    BOTTLES DRAWN AEROBIC AND ANAEROBIC Blood Culture adequate volume Performed at [facility], [address] [place], [place] [address]    Culture  Setup Time   Final    GRAM POSITIVE COCCI IN CLUSTERS ANAEROBIC BOTTLE ONLY CRITICAL VALUE NOTED.  VALUE IS CONSISTENT WITH PREVIOUSLY REPORTED AND CALLED VALUE.    Culture (A)  Final    STAPHYLOCOCCUS AUREUS Sent to [place] for further susceptibility testing. Performed at [facility], [place] [address] [place], [place] [address]    Report Status PENDING  Incomplete  Cath Tip Culture     Status: None   Collection Time: 05/14/19  4:24 PM   Specimen: Catheter Tip; Other  Result Value Ref Range Status   Specimen Description   Final    CATH TIP Performed at [facility], [address], [place], [place] [address]    Special Requests   Final    NONE Performed at [facility], [address], [place], [place] [address]    Culture   Final    NO GROWTH 3 DAYS Performed at [facility], [place] [address] [place], [place] [address]    Report Status 05/17/2019 FINAL  Final  CULTURE, BLOOD (ROUTINE X 2) w Reflex to ID Panel     Status: Abnormal   Collection Time: 05/14/19  6:25 PM   Specimen: BLOOD  Result Value Ref Range Status   Specimen Description   Final    BLOOD BLOOD LEFT HAND Performed at [facility], [address] [place], [place] [address]    Special Requests   Final    BOTTLES DRAWN AEROBIC AND ANAEROBIC Blood Culture results may not be optimal due to an inadequate volume of blood received in  culture bottles Performed at [facility], [address], [place], [place] [address]    Culture  Setup Time   Final    GRAM POSITIVE COCCI ANAEROBIC BOTTLE ONLY CRITICAL VALUE NOTED.  VALUE IS CONSISTENT WITH PREVIOUSLY REPORTED AND CALLED VALUE. [place] Performed at [facility], [address] [place], [place] [address]    Culture (A)  Final    STAPHYLOCOCCUS AUREUS SUSCEPTIBILITIES PERFORMED ON PREVIOUS CULTURE WITHIN THE LAST 5 DAYS. Performed at [facility], [place] [address] [place], [place] [address]    Report Status 05/17/2019 FINAL  Final  Culture, blood (Routine X 2) w Reflex to ID Panel     Status: None   Collection Time: 05/16/19 10:45 AM   Specimen: BLOOD  Result Value Ref Range Status   Specimen Description BLOOD RIGHT HAND  Final   Special Requests BLOOD Blood Culture adequate volume  Final   Culture   Final    NO GROWTH 5 DAYS Performed at [facility], [address], [place], [place] [address]    Report Status 05/31/2019 FINAL  Final  Culture, blood (Routine X 2) w Reflex to ID Panel     Status: None   Collection Time: 05/16/19 12:38 PM   Specimen: BLOOD  Result Value Ref Range Status   Specimen Description BLOOD RT HAND  Final   Special Requests   Final    BOTTLES DRAWN AEROBIC AND ANAEROBIC Blood Culture adequate volume   Culture   Final    NO GROWTH 5 DAYS Performed at [facility], [address], [place], [place] [address]    Report Status 05/09/2019 FINAL  Final         Radiology Studies: No results found.    Scheduled Meds: . sodium chloride   Intravenous Once  . allopurinol  100 mg Oral QPM  . amLODipine  2.5 mg Oral Daily  . vitamin C  500 mg Oral BID  . atorvastatin  10 mg Oral q1800  . brimonidine  1 drop Both Eyes Q12H   And  . timolol  1 drop Both Eyes Q12H  . carvedilol  25 mg Oral BID WC  . Chlorhexidine Gluconate Cloth  6 each Topical Daily  . epoetin (EPOGEN/PROCRIT) injection   4,000 Units Intravenous Q M,W,F-HD  . feeding supplement (NEPRO CARB STEADY)  237 mL Oral QID  . insulin aspart  0-15 Units Subcutaneous TID WC  . insulin aspart  0-5 Units Subcutaneous QHS  . latanoprost  1 drop Both Eyes QPM  . levETIRAcetam  1,000 mg Oral QPM  . megestrol  400 mg Oral BID  . midodrine  5 mg Oral Q M,W,F  . mirtazapine  7.5 mg Oral QHS  . multivitamin  1 tablet Oral QHS  . pantoprazole  40 mg Oral QHS  . sertraline  50 mg Oral Daily   Continuous Infusions: . sodium chloride 30 mL (05/24/19 1130)  . ceFTAROline (TEFLARO) IV 200 mg (05/24/19 0327)  . DAPTOmycin (CUBICIN)  IV Stopped (05/17/2019 2050)     LOS: 14 days      Debbe Odea, MD [facility] Pager: [email or web address] 05/24/2019, 12:22 PM

## 2019-05-25 DIAGNOSIS — R630 Anorexia: Secondary | ICD-10-CM | POA: Diagnosis not present

## 2019-05-25 DIAGNOSIS — J9601 Acute respiratory failure with hypoxia: Secondary | ICD-10-CM | POA: Diagnosis not present

## 2019-05-25 DIAGNOSIS — R7881 Bacteremia: Secondary | ICD-10-CM | POA: Diagnosis not present

## 2019-05-25 DIAGNOSIS — N186 End stage renal disease: Secondary | ICD-10-CM | POA: Diagnosis not present

## 2019-05-25 LAB — GLUCOSE, CAPILLARY
Glucose-Capillary: 143 mg/dL — ABNORMAL HIGH (ref 70–99)
Glucose-Capillary: 149 mg/dL — ABNORMAL HIGH (ref 70–99)
Glucose-Capillary: 155 mg/dL — ABNORMAL HIGH (ref 70–99)
Glucose-Capillary: 112 mg/dL — ABNORMAL HIGH (ref 70–99)
Glucose-Capillary: 178 mg/dL — ABNORMAL HIGH (ref 70–99)

## 2019-05-25 NOTE — Progress Notes (Signed)
Physical Therapy Treatment Patient Details Name: [place] Vargas MRN: [id number] DOB: Sep 27, 1945 Today's Date: 05/25/2019    History of Present Illness 74 year old who has been followed by [facility] nephrology and initiated dialysis in January.  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 Infected HD port; admitted with septic shock, MRSA bacteremia.    PT Comments    Pt is making limited progress towards goals. Sleeping upon arrival, however arousable. Agreeable to limited there-ex, feels like BP is high, assessed 158/41. Noted pt on RA with sats at 86%, improved to 95% while on 2L of O2. Will continue to progress as able.   Follow Up Recommendations  SNF;Supervision/Assistance - 24 hour     Equipment Recommendations  None recommended by PT    Recommendations for Other Services       Precautions / Restrictions Precautions Precautions: Fall Restrictions Weight Bearing Restrictions: No    Mobility  Bed Mobility               General bed mobility comments: deferred due to lack of assistance  Transfers                    Ambulation/Gait                 Stairs             Wheelchair Mobility    Modified Rankin (Stroke Patients Only)       Balance                                            Cognition Arousal/Alertness: Awake/alert Behavior During Therapy: Flat affect Overall Cognitive Status: History of cognitive impairments - at baseline                                        Exercises Other Exercises Other Exercises: supine ther-ex performed on B LE including hip abd/add, heel slides, and R shoulder ab/add with resistance. No movement noted on L UE. 10 reps with min assist    General Comments        Pertinent Vitals/Pain Pain Assessment: No/denies pain    Home Living                       Prior Function            PT Goals (current goals can now be found in the care plan section) Acute Rehab PT Goals Patient Stated Goal: to have pt overall feel better PT Goal Formulation: Patient unable to participate in goal setting Potential to Achieve Goals: Fair Progress towards PT goals: Progressing toward goals    Frequency    Min 2X/week      PT Plan Current plan remains appropriate    Co-evaluation              AM-PAC PT "6 Clicks" Mobility   Outcome Measure  Help needed turning from your back to your side while in a flat bed without using bedrails?: Total Help needed moving from lying on your back to sitting on the side of a flat bed without using bedrails?: Total Help needed moving to and from a bed  to a chair (including a wheelchair)?: Total Help needed standing up from a chair using your arms (e.g., wheelchair or bedside chair)?: Total Help needed to walk in hospital room?: Total Help needed climbing 3-5 steps with a railing? : Total 6 Click Score: 6    End of Session   Activity Tolerance: Patient limited by fatigue Patient left: with bed alarm set;with call bell/phone within reach;in bed;with SCD's reapplied Nurse Communication: Mobility status PT Visit Diagnosis: Muscle weakness (generalized) (M62.81);Other abnormalities of gait and mobility (R26.89)     Time: 2992-4268 PT Time Calculation (min) (ACUTE ONLY): 15 min  Charges:  $Therapeutic Exercise: 8-22 mins                     Greggory Stallion, PT, DPT [phone]    [name] 05/25/2019, 4:45 PM

## 2019-05-25 NOTE — Progress Notes (Signed)
[facility]  ROUNDING NOTE   Subjective:  Patient underwent dialysis treatment yesterday. Lethargic but arousable today.   Objective:  Vital signs in last 24 hours:  Temp:  [97.6 F (36.4 C)-98.3 F (36.8 C)] 98.3 F (36.8 C) (04/18 1212) Pulse Rate:  [53-66] 53 (04/18 1212) Resp:  [16-26] 16 (04/18 1212) BP: (140-155)/(49-92) 148/57 (04/18 1212) SpO2:  [93 %-100 %] 100 % (04/18 1212)  Weight change:  Filed Weights   05/24/2019 1608 05/23/19 0500 05/24/19 0628  Weight: 72 kg 72.5 kg 64.7 kg    Intake/Output: I/O last 3 completed shifts: In: 1343.5 [IV Piggyback:1343.5] Out: 0    Intake/Output this shift:  Total I/O In: 200 [P.O.:200] Out: -   Physical Exam: General: Ill appearing  Head: Normocephalic, atraumatic.   Eyes: Anicteric  Neck: Supple  Lungs:  Clear bilateral, normal effort  Heart: Regular  rhythm  Abdomen:  Soft, nontender   Extremities: no peripheral edema. Feet in soft supports  Neurologic: Lethargic, but arousable  Skin: No lesions  Access: Left IJ PermCath    Basic Metabolic Panel: Recent Labs  Lab 05/09/2019 0624 05/25/2019 0624 05/25/2019 0722 06/02/2019 0640 05/16/2019 1540 05/19/2019 0032  NA 145  --  148* 145  --  142  K 3.8  --  5.1 3.6 3.6 3.6  CL 111  --  118* 111  --  106  CO2 24  --  21* 25  --  23  GLUCOSE 135*  --  120* 135*  --  140*  BUN 47*  --  56* 35*  --  19  CREATININE 3.66*  --  4.10* 2.92*  --  1.85*  CALCIUM 8.7*   < > 8.8* 8.2*  --  8.3*   < > = values in this interval not displayed.    Liver Function Tests: Recent Labs  Lab 05/31/2019 0624 06/06/2019 0722 05/15/2019 0640  AST 21 22 18   ALT 18 17 16   ALKPHOS 77 83 82  BILITOT 1.7* 1.7* 1.6*  PROT 5.0* 5.5* 5.5*  ALBUMIN 1.8* 1.9* 2.0*   No results for input(s): LIPASE, AMYLASE in the last 168 hours. No results for input(s): AMMONIA in the last 168 hours.  CBC: Recent Labs  Lab 05/08/2019 0624 05/11/2019 0928 05/09/2019 0640  WBC 9.3 7.8 9.2  HGB 6.5*  11.4* 10.1*  HCT 19.6* 34.2* 30.0*  MCV 94.7 90.0 90.4  PLT 134* 138* 137*    Cardiac Enzymes: Recent Labs  Lab 05/17/2019 0640  CKTOTAL 16*    BNP: Invalid input(s): POCBNP  CBG: Recent Labs  Lab 05/24/19 1630 05/24/19 2133 05/25/19 0743 05/25/19 0918 05/25/19 1130  GLUCAP 134* 164* 143* 149* 155*    Microbiology: Results for orders placed or performed during the hospital encounter of 05/13/2019  Blood culture (routine x 2)     Status: Abnormal   Collection Time: 05/17/2019 12:34 PM   Specimen: BLOOD  Result Value Ref Range Status   Specimen Description   Final    BLOOD R UP ARM Performed at [facility], [address] [place], [place] [address]    Special Requests   Final    BOTTLES DRAWN AEROBIC AND ANAEROBIC Blood Culture adequate volume Performed at [facility], [address] [place], [place] [address]    Culture  Setup Time   Final    Organism ID to follow IN BOTH AEROBIC AND ANAEROBIC BOTTLES GRAM POSITIVE COCCI CRITICAL RESULT CALLED TO, READ BACK BY AND VERIFIED WITH:  SCOTT HALL ON 05/11/19 AT 0210 [facility] Performed at [facility], [address] [place], [place] [address]    Culture (A)  Final    STAPHYLOCOCCUS AUREUS SUSCEPTIBILITIES PERFORMED ON PREVIOUS CULTURE WITHIN THE LAST 5 DAYS. Performed at [facility], [place] [address] [place], [place] [address]    Report Status 05/13/2019 FINAL  Final  Blood Culture ID Panel (Reflexed)     Status: Abnormal   Collection Time: 05/24/2019 12:34 PM  Result Value Ref Range Status   Enterococcus species NOT DETECTED NOT DETECTED Final   Listeria monocytogenes NOT DETECTED NOT DETECTED Final   Staphylococcus species DETECTED (A) NOT DETECTED Final    Comment: CRITICAL RESULT CALLED TO, READ BACK BY AND VERIFIED WITH: SCOTT HALL ON 05/11/19 AT 0210 [facility]    Staphylococcus aureus (BCID) DETECTED (A) NOT DETECTED Final    Comment: Methicillin (oxacillin)-resistant Staphylococcus  aureus (MRSA). MRSA is predictably resistant to beta-lactam antibiotics (except ceftaroline). Preferred therapy is vancomycin unless clinically contraindicated. Patient requires contact precautions if  hospitalized. CRITICAL RESULT CALLED TO, READ BACK BY AND VERIFIED WITH: SCOTT HALL ON 05/11/19 AT 0210 [facility]    Methicillin resistance DETECTED (A) NOT DETECTED Final    Comment: CRITICAL RESULT CALLED TO, READ BACK BY AND VERIFIED WITH: SCOTT HALL ON 05/11/19 AT 0210 [place]    Streptococcus species NOT DETECTED NOT DETECTED Final   Streptococcus agalactiae NOT DETECTED NOT DETECTED Final   Streptococcus pneumoniae NOT DETECTED NOT DETECTED Final   Streptococcus pyogenes NOT DETECTED NOT DETECTED Final   Acinetobacter baumannii NOT DETECTED NOT DETECTED Final   Enterobacteriaceae species NOT DETECTED NOT DETECTED Final   Enterobacter cloacae complex NOT DETECTED NOT DETECTED Final   Escherichia coli NOT DETECTED NOT DETECTED Final   Klebsiella oxytoca NOT DETECTED NOT DETECTED Final   Klebsiella pneumoniae NOT DETECTED NOT DETECTED Final   Proteus species NOT DETECTED NOT DETECTED Final   Serratia marcescens NOT DETECTED NOT DETECTED Final   Haemophilus influenzae NOT DETECTED NOT DETECTED Final   Neisseria meningitidis NOT DETECTED NOT DETECTED Final   Pseudomonas aeruginosa NOT DETECTED NOT DETECTED Final   Candida albicans NOT DETECTED NOT DETECTED Final   Candida glabrata NOT DETECTED NOT DETECTED Final   Candida krusei NOT DETECTED NOT DETECTED Final   Candida parapsilosis NOT DETECTED NOT DETECTED Final   Candida tropicalis NOT DETECTED NOT DETECTED Final    Comment: Performed at [facility], [address] [place], [place] [address]  Blood culture (routine x 2)     Status: Abnormal   Collection Time: 06/06/2019  1:24 PM   Specimen: BLOOD  Result Value Ref Range Status   Specimen Description   Final    BLOOD R LATREAL BICEP Performed at [facility], [address] [place], [place] [address]    Special Requests   Final    BOTTLES DRAWN AEROBIC AND ANAEROBIC Blood Culture adequate volume Performed at [facility], [address] [place], [place] [address]    Culture  Setup Time   Final    IN BOTH AEROBIC AND ANAEROBIC BOTTLES GRAM POSITIVE COCCI CRITICAL VALUE NOTED.  VALUE IS CONSISTENT WITH PREVIOUSLY REPORTED AND CALLED VALUE. Performed at [facility], [address] [place], [place] [address]    Culture METHICILLIN RESISTANT STAPHYLOCOCCUS AUREUS (A)  Final   Report Status 05/13/2019 FINAL  Final   Organism ID, Bacteria METHICILLIN RESISTANT STAPHYLOCOCCUS AUREUS  Final      Susceptibility  Methicillin resistant staphylococcus aureus - MIC*    CIPROFLOXACIN >=8 RESISTANT Resistant     ERYTHROMYCIN >=8 RESISTANT Resistant     GENTAMICIN <=0.5 SENSITIVE Sensitive     OXACILLIN >=4 RESISTANT Resistant     TETRACYCLINE <=1 SENSITIVE Sensitive     VANCOMYCIN <=0.5 SENSITIVE Sensitive     TRIMETH/SULFA >=320 RESISTANT Resistant     CLINDAMYCIN <=0.25 SENSITIVE Sensitive     RIFAMPIN <=0.5 SENSITIVE Sensitive     Inducible Clindamycin NEGATIVE Sensitive     * METHICILLIN RESISTANT STAPHYLOCOCCUS AUREUS  Respiratory Panel by RT PCR (Flu A&B, Covid) - Nasopharyngeal Swab     Status: None   Collection Time: 05/17/2019  1:24 PM   Specimen: Nasopharyngeal Swab  Result Value Ref Range Status   SARS Coronavirus 2 by RT PCR NEGATIVE NEGATIVE Final    Comment: (NOTE) SARS-CoV-2 target nucleic acids are NOT DETECTED. The SARS-CoV-2 RNA is generally detectable in upper respiratoy specimens during the acute phase of infection. The lowest concentration of SARS-CoV-2 viral copies this assay can detect is 131 copies/mL. A negative result does not preclude SARS-Cov-2 infection and should not be used as the sole basis for treatment or other patient management decisions. A negative result may occur with  improper specimen  collection/handling, submission of specimen other than nasopharyngeal swab, presence of viral mutation(s) within the areas targeted by this assay, and inadequate number of viral copies (<131 copies/mL). A negative result must be combined with clinical observations, patient history, and epidemiological information. The expected result is Negative. Fact Sheet for Patients:  PinkCheek.be Fact Sheet for Healthcare Providers:  GravelBags.it This test is not yet ap proved or cleared by the [place] FDA and  has been authorized for detection and/or diagnosis of SARS-CoV-2 by FDA under an Emergency Use Authorization (EUA). This EUA will remain  in effect (meaning this test can be used) for the duration of the COVID-19 declaration under Section 564(b)(1) of the Act, 21 U.S.C. section 360bbb-3(b)(1), unless the authorization is terminated or revoked sooner.    Influenza A by PCR NEGATIVE NEGATIVE Final   Influenza B by PCR NEGATIVE NEGATIVE Final    Comment: (NOTE) The Xpert Xpress SARS-CoV-2/FLU/RSV assay is intended as an aid in  the diagnosis of influenza from Nasopharyngeal swab specimens and  should not be used as a sole basis for treatment. Nasal washings and  aspirates are unacceptable for Xpert Xpress SARS-CoV-2/FLU/RSV  testing. Fact Sheet for Patients: PinkCheek.be Fact Sheet for Healthcare Providers: GravelBags.it This test is not yet approved or cleared by the [place] FDA and  has been authorized for detection and/or diagnosis of SARS-CoV-2 by  FDA under an Emergency Use Authorization (EUA). This EUA will remain  in effect (meaning this test can be used) for the duration of the  Covid-19 declaration under Section 564(b)(1) of the Act, 21  U.S.C. section 360bbb-3(b)(1), unless the authorization is  terminated or revoked. Performed at [facility],  [address] [place], [place] [address]   MRSA PCR Screening     Status: None   Collection Time: 05/17/2019  6:27 PM   Specimen: Nasopharyngeal  Result Value Ref Range Status   MRSA by PCR NEGATIVE NEGATIVE Final    Comment:        The GeneXpert MRSA Assay (FDA approved for NASAL specimens only), is one component of a comprehensive MRSA colonization surveillance program. It is not intended to diagnose MRSA infection nor to guide or monitor treatment for MRSA infections. Performed  at [facility], [address] [place], [place] [address]   Urine culture     Status: Abnormal   Collection Time: 06/04/2019 10:30 PM   Specimen: Urine, Clean Catch  Result Value Ref Range Status   Specimen Description   Final    URINE, CLEAN CATCH Performed at [facility], [address] [place], [place] [address]    Special Requests   Final    NONE Performed at [facility], [place], [place] [address]    Culture (A)  Final    >=100,000 COLONIES/mL KLEBSIELLA PNEUMONIAE >=100,000 COLONIES/mL VANCOMYCIN RESISTANT ENTEROCOCCUS    Report Status 05/14/2019 FINAL  Final   Organism ID, Bacteria KLEBSIELLA PNEUMONIAE (A)  Final   Organism ID, Bacteria VANCOMYCIN RESISTANT ENTEROCOCCUS (A)  Final      Susceptibility   Klebsiella pneumoniae - MIC*    AMPICILLIN >=32 RESISTANT Resistant     CEFAZOLIN <=4 SENSITIVE Sensitive     CEFTRIAXONE <=0.25 SENSITIVE Sensitive     CIPROFLOXACIN <=0.25 SENSITIVE Sensitive     GENTAMICIN <=1 SENSITIVE Sensitive     IMIPENEM 0.5 SENSITIVE Sensitive     NITROFURANTOIN 64 INTERMEDIATE Intermediate     TRIMETH/SULFA <=20 SENSITIVE Sensitive     AMPICILLIN/SULBACTAM 4 SENSITIVE Sensitive     PIP/TAZO <=4 SENSITIVE Sensitive     * >=100,000 COLONIES/mL KLEBSIELLA PNEUMONIAE   Vancomycin resistant enterococcus - MIC*    AMPICILLIN >=32 RESISTANT Resistant     NITROFURANTOIN 256 RESISTANT Resistant     VANCOMYCIN  >=32 RESISTANT Resistant     LINEZOLID 2 SENSITIVE Sensitive     * >=100,000 COLONIES/mL VANCOMYCIN RESISTANT ENTEROCOCCUS  Cath Tip Culture     Status: Abnormal   Collection Time: 05/11/19  1:10 PM   Specimen: Catheter Tip; Other  Result Value Ref Range Status   Specimen Description   Final    CATH TIP Performed at [facility], [address] [place], [place] [address]    Special Requests   Final    NONE Performed at [facility], [address] [place], [place] [address]    Culture (A)  Final    >=100,000 COLONIES/mL METHICILLIN RESISTANT STAPHYLOCOCCUS AUREUS   Report Status 05/13/2019 FINAL  Final   Organism ID, Bacteria METHICILLIN RESISTANT STAPHYLOCOCCUS AUREUS (A)  Final      Susceptibility   Methicillin resistant staphylococcus aureus - MIC*    CIPROFLOXACIN >=8 RESISTANT Resistant     ERYTHROMYCIN >=8 RESISTANT Resistant     GENTAMICIN <=0.5 SENSITIVE Sensitive     OXACILLIN >=4 RESISTANT Resistant     TETRACYCLINE <=1 SENSITIVE Sensitive     VANCOMYCIN 1 SENSITIVE Sensitive     TRIMETH/SULFA >=320 RESISTANT Resistant     CLINDAMYCIN <=0.25 SENSITIVE Sensitive     RIFAMPIN <=0.5 SENSITIVE Sensitive     Inducible Clindamycin NEGATIVE Sensitive     * >=100,000 COLONIES/mL METHICILLIN RESISTANT STAPHYLOCOCCUS AUREUS  Culture, blood (routine x 2)     Status: Abnormal   Collection Time: 05/11/19  4:27 PM   Specimen: BLOOD  Result Value Ref Range Status   Specimen Description   Final    BLOOD A-LINE Performed at [facility], [address], [place], [place] [address]    Special Requests   Final    BOTTLES DRAWN AEROBIC AND ANAEROBIC Blood Culture adequate volume Performed at [facility], [address] [place],  [address]    Culture  Setup Time  Final    GRAM POSITIVE COCCI ANAEROBIC BOTTLE ONLY CRITICAL VALUE NOTED.  VALUE IS CONSISTENT WITH PREVIOUSLY REPORTED AND CALLED VALUE. Performed at [facility], [address] [place], [place] [address]    Culture (A)  Final    STAPHYLOCOCCUS AUREUS SUSCEPTIBILITIES PERFORMED ON PREVIOUS CULTURE WITHIN THE LAST 5 DAYS. Performed at [facility], [place] [address] [place], [place] [address]    Report Status 05/24/2019 FINAL  Final  Culture, blood (routine x 2)     Status: Abnormal   Collection Time: 05/12/19  6:06 AM   Specimen: BLOOD  Result Value Ref Range Status   Specimen Description   Final    BLOOD BLOOD RIGHT HAND Performed at [facility], [address], [place], [place] [address]    Special Requests   Final    BOTTLES DRAWN AEROBIC AND ANAEROBIC Blood Culture adequate volume Performed at [facility], [address] [place], [place] [address]    Culture  Setup Time   Final    GRAM POSITIVE COCCI ANAEROBIC BOTTLE ONLY CRITICAL RESULT CALLED TO, READ BACK BY AND VERIFIED WITH: [place] ON 05/12/2019 BY MOSLEY,J Performed at [facility], [address] [place], [place] [address]    Culture METHICILLIN RESISTANT STAPHYLOCOCCUS AUREUS (A)  Final   Report Status 05/24/2019 FINAL  Final   Organism ID, Bacteria METHICILLIN RESISTANT STAPHYLOCOCCUS AUREUS  Final      Susceptibility   Methicillin resistant staphylococcus aureus - MIC*    CIPROFLOXACIN >=8 RESISTANT Resistant     ERYTHROMYCIN >=8 RESISTANT Resistant     GENTAMICIN <=0.5 SENSITIVE Sensitive     OXACILLIN >=4 RESISTANT Resistant     TETRACYCLINE <=1 SENSITIVE Sensitive     VANCOMYCIN 1 SENSITIVE Sensitive     TRIMETH/SULFA >=320 RESISTANT Resistant     CLINDAMYCIN <=0.25 SENSITIVE Sensitive     RIFAMPIN <=0.5 SENSITIVE Sensitive     Inducible Clindamycin NEGATIVE Sensitive     * METHICILLIN RESISTANT STAPHYLOCOCCUS AUREUS  CULTURE, BLOOD (ROUTINE X 2) w Reflex to ID Panel     Status: Abnormal   Collection Time: 05/13/19  2:20 PM   Specimen: BLOOD  Result Value Ref Range Status   Specimen Description   Final     BLOOD BLOOD RIGHT HAND Performed at [facility], [address], [place], [place] [address]    Special Requests   Final    BOTTLES DRAWN AEROBIC AND ANAEROBIC Blood Culture adequate volume Performed at [facility], [address] [place], [place] [address]    Culture  Setup Time   Final    GRAM POSITIVE COCCI ANAEROBIC BOTTLE ONLY CRITICAL RESULT CALLED TO, READ BACK BY AND VERIFIED WITH: [facility] KATSOUDAS AT 0762 05/14/19 SDR Performed at [facility], [address] [place], [place] [address]    Culture (A)  Final    STAPHYLOCOCCUS AUREUS SUSCEPTIBILITIES PERFORMED ON PREVIOUS CULTURE WITHIN THE LAST 5 DAYS. Performed at [facility], [place] [address], [place], [place] [address]    Report Status 05/16/2019 FINAL  Final  CULTURE, BLOOD (ROUTINE X 2) w Reflex to ID Panel     Status: None   Collection Time: 05/13/19  3:39 PM   Specimen: BLOOD  Result Value Ref Range Status   Specimen Description BLOOD BLOOD LEFT HAND  Final   Special Requests   Final    BOTTLES DRAWN AEROBIC ONLY Blood Culture adequate volume   Culture  Final    NO GROWTH 5 DAYS Performed at [facility], [address] [place] [employer], [place] [address]    Report Status 05/18/2019 FINAL  Final  CULTURE, BLOOD (ROUTINE X 2) w Reflex to ID Panel     Status: Abnormal   Collection Time: 05/14/19  3:33 PM   Specimen: BLOOD  Result Value Ref Range Status   Specimen Description   Final    BLOOD BLOOD RIGHT HAND Performed at [facility], [address], [place], [place] [address]    Special Requests   Final    BOTTLES DRAWN AEROBIC AND ANAEROBIC Blood Culture adequate volume Performed at [facility], [address] [place], [place] [address]    Culture  Setup Time   Final    GRAM POSITIVE COCCI IN CLUSTERS ANAEROBIC BOTTLE ONLY CRITICAL VALUE NOTED.  VALUE IS CONSISTENT WITH PREVIOUSLY REPORTED AND CALLED VALUE.    Culture (A)  Final     STAPHYLOCOCCUS AUREUS SEE SEPARATE REPORT IN [facility] FOR DOS 05/14/19. Performed at [facility], [place] [address], [place], [place] [address]    Report Status 05/24/2019 FINAL  Final  Cath Tip Culture     Status: None   Collection Time: 05/14/19  4:24 PM   Specimen: Catheter Tip; Other  Result Value Ref Range Status   Specimen Description   Final    CATH TIP Performed at [facility], [address], [place], [place] [address]    Special Requests   Final    NONE Performed at [facility], [address], [place], [place] [address]    Culture   Final    NO GROWTH 3 DAYS Performed at [facility], [place] [address] [place], [place] [address]    Report Status 05/17/2019 FINAL  Final  CULTURE, BLOOD (ROUTINE X 2) w Reflex to ID Panel     Status: Abnormal   Collection Time: 05/14/19  6:25 PM   Specimen: BLOOD  Result Value Ref Range Status   Specimen Description   Final    BLOOD BLOOD LEFT HAND Performed at [facility], [address] [place], [place] [address]    Special Requests   Final    BOTTLES DRAWN AEROBIC AND ANAEROBIC Blood Culture results may not be optimal due to an inadequate volume of blood received in culture bottles Performed at [facility], [address], [place], [place] [address]    Culture  Setup Time   Final    GRAM POSITIVE COCCI ANAEROBIC BOTTLE ONLY CRITICAL VALUE NOTED.  VALUE IS CONSISTENT WITH PREVIOUSLY REPORTED AND CALLED VALUE. [place] Performed at [facility], [address] [place], [place] [address]    Culture (A)  Final    STAPHYLOCOCCUS AUREUS SUSCEPTIBILITIES PERFORMED ON PREVIOUS CULTURE WITHIN THE LAST 5 DAYS. Performed at [facility], [place] [address] [place], [place] [address]    Report Status 05/17/2019 FINAL  Final  Culture, blood (Routine X 2) w Reflex to ID Panel     Status: None   Collection Time: 05/16/19 10:45 AM   Specimen: BLOOD  Result Value Ref Range Status    Specimen Description BLOOD RIGHT HAND  Final   Special Requests BLOOD Blood Culture adequate volume  Final   Culture   Final    NO GROWTH 5 DAYS Performed at [facility], [address], [place], [place] [address]    Report Status 05/21/2019 FINAL  Final  Culture,  blood (Routine X 2) w Reflex to ID Panel     Status: None   Collection Time: 05/16/19 12:38 PM   Specimen: BLOOD  Result Value Ref Range Status   Specimen Description BLOOD RT HAND  Final   Special Requests   Final    BOTTLES DRAWN AEROBIC AND ANAEROBIC Blood Culture adequate volume   Culture   Final    NO GROWTH 5 DAYS Performed at [facility], [address], [place], [place] [address]    Report Status 05/23/2019 FINAL  Final    Coagulation Studies: No results for input(s): LABPROT, INR in the last 72 hours.  Urinalysis: No results for input(s): COLORURINE, LABSPEC, PHURINE, GLUCOSEU, HGBUR, BILIRUBINUR, KETONESUR, PROTEINUR, UROBILINOGEN, NITRITE, LEUKOCYTESUR in the last 72 hours.  Invalid input(s): APPERANCEUR    Imaging: No results found.   Medications:   . sodium chloride 30 mL (05/25/19 1109)  . ceFTAROline (TEFLARO) IV 200 mg (05/25/19 1114)  . DAPTOmycin (CUBICIN)  IV Stopped (05/24/19 2251)   . sodium chloride   Intravenous Once  . allopurinol  100 mg Oral QPM  . amLODipine  2.5 mg Oral Daily  . vitamin C  500 mg Oral BID  . atorvastatin  10 mg Oral q1800  . brimonidine  1 drop Both Eyes Q12H   And  . timolol  1 drop Both Eyes Q12H  . carvedilol  25 mg Oral BID WC  . Chlorhexidine Gluconate Cloth  6 each Topical Daily  . epoetin (EPOGEN/PROCRIT) injection  4,000 Units Intravenous Q M,W,F-HD  . feeding supplement (NEPRO CARB STEADY)  237 mL Oral QID  . latanoprost  1 drop Both Eyes QPM  . levETIRAcetam  1,000 mg Oral QPM  . megestrol  400 mg Oral BID  . midodrine  5 mg Oral Q M,W,F  . mirtazapine  7.5 mg Oral QHS  . multivitamin  1 tablet Oral QHS  . pantoprazole  40  mg Oral QHS  . sertraline  50 mg Oral Daily     so far Assessment/ Plan:  [name] is a 74 y.o. black female with end stage renal disease on hemodialysis, CVA with left hemiparesis, hypertension, hyperlipidemia, GERD, glaucoma, diabetes mellitus type II, coronary artery disease, gout, seizure disorder who was admitted to [facility] on 06/06/2019 for Hypokalemia [E87.6] Shock ([place]) [R57.9] ESRD (end stage renal disease) ([place]) [N18.6] Hypotension, unspecified hypotension type [I95.9] Anemia, unspecified type [D64.9]  [facility] M[facility]  63.5kg  # End Stage Renal Disease:  Patient due for dialysis treatment again tomorrow.  Orders to be prepared.  # Hypotension: with MRSA bacteremia/sepsis Persistent positive blood cultures from 4/7.   Neg results from 4/9 so far - Continue Teflaro and daptomycin. As per ID recommendations   -Daptomycin approved on the outpatient basis.    #. Anemia with chronic kidney disease:  Lab Results  Component Value Date   HGB 10.1 (L) 05/23/2019  Continue Epogen 4000 IV with dialysis treatment.  # Secondary Hyperparathyroidism:  Off binders at present Lab Results  Component Value Date   PTH 232 (H) 05/11/2019   CALCIUM 8.3 (L) 05/31/2019   PHOS 1.3 (L) 05/16/2019  Recheck phosphorus with next dialysis treatment as phosphorus has been low.  #Malnutrition:   Currently on pureed diet albumin 2.0 on 05/18/2019     LOS: 15 [place] 4/18/20214:40 PM

## 2019-05-25 NOTE — Progress Notes (Signed)
PROGRESS NOTE    Crystal Vargas   ZGY:[id number]  DOB: 30-Jun-1945  DOA: 05/11/2019 PCP: Marisa Hua, MD   Brief Narrative:  Crystal Vargas is a 73 y.o. female who is bed bound and from SNF with history of end-stage renal disease recently started on hemodialysis, history of right MCA stroke, history of dysphagia due to Schatzki's ring and is status post esophageal dilatation who was admitted with altered mental status.  Her daughter noted poor PO intake thought to be due to dysphagia.  In the ED, she was hypotensive and had a K of 2.1 and a Hb of 6.9.  She was given IVF, IV Abx and admitted to the ICU.   Subjective: Poorly communicative. No appetite. Per tech who is trying to feed her, she barely had a few bites of breakfast.    Assessment & Plan:   Principal Problem:   MRSA bacteremia/ septic shock - due to infected dialysis cath - appreciate ID assistance- currently on Daptomycin and Teflaro- awating approval for dapto - repeat cultures 4/9 negative - plan for TEE on Monday and then return to SNF  Active Problems: Dysphagia- chronic issue - EGD performed 4/14 revealed and esophageal stenosis that was dilated  - OK to proceed with TEE for above  Poor oral intake  eating < 50 % of meals  - 4/17> continue to barely eat meals - 4/17>- added Megace- increased Nepro- started calorie count - She does not want a feeding tube  -4/18- eating 0% of meals per calorie count. Only had 1.5 Nepro shakes yesterday.   Acute hypoxic respiratory failure - 4/15> on 3-4 L of O 2 with pulse ox in mid 90s - RR in high 20-low 30 range - Have asked cardiology to hold off on TTE in light of these findings - fluid removed with dialysis on 4/16- no longer hypoxic    ESRD on HD - infected cath removed and tunnelled HD cath placed on 4/12 -cont dialysis per renal team - MWF dialysis with [facility]  H/o HTN - meds being titrated by Renal  Acute anemia (possible related to infection/  acute illness) in addition to CKD - has been transfused 3 U PRBC so far - 4/3 (1 U) and 4/12 ( 2 U)    Diabetes Mellitus 2 - resolved?  - A1c checked on 4/3 was 5.6 and she was not on medications as outpt - CBG in hospital are not elevated- she is barely eating- d/c SSI and CBGs   Status post CVA  - bed bound with left sided weakness (0/5)and left facial droop  Seizure disorder - cont Keppra-    Mild thrombocytopenia - possible due to sepsis- follow  Depression?  - has a flat affect, poor appetite, barely eating but states she is not depressed - cont Remeron and Zoloft  Time spent in minutes: 35 DVT prophylaxis: SCDs Code Status: Full code Family Communication: speaking with daughter Crystal Vargas Disposition Plan: return to SNF eventually - plan for TEE on Monday and then back to SNF Consultants:  Nephro CArdiology GI ID Palliative care Procedures:   4/14- EGD Benign-appearing esophageal stenosis  4/12- left IJ  2 D ECHO 1. Left ventricular ejection fraction, by estimation, is 45 to 50%. The  left ventricle has mildly decreased function. The left ventricle has no  regional wall motion abnormalities. There is moderate concentric left  ventricular hypertrophy. Left  ventricular diastolic parameters are consistent with Grade I diastolic  dysfunction (impaired relaxation).  2.  Right ventricular systolic function is normal. The right ventricular  size is mildly enlarged. There is moderately elevated pulmonary artery  systolic pressure.  3. Left atrial size was mildly dilated.  4. Right atrial size was mildly dilated.  5. The mitral valve was not well visualized. Mild to moderate mitral  valve regurgitation.  6. Tricuspid valve regurgitation is moderate to severe.  7. The aortic valve is grossly normal. Aortic valve regurgitation is  trivial. No aortic stenosis is present.  Antimicrobials:  Anti-infectives (From admission, onward)   Start     Dose/Rate Route  Frequency Ordered Stop   05/30/2019 1645  clindamycin (CLEOCIN) IVPB 300 mg  Status:  Discontinued     300 mg 100 mL/hr over 30 Minutes Intravenous  Once 06/05/2019 1642 06/04/2019 1804   05/09/2019 1645  clindamycin (CLEOCIN) 300 MG/50ML IVPB    Note to Pharmacy: Genelle Bal   : cabinet override      05/24/2019 1645 05/09/2019 0459   06/02/2019 1600  ceftaroline (TEFLARO) 200 mg in sodium chloride 0.9 % 250 mL IVPB     200 mg 250 mL/hr over 60 Minutes Intravenous Every 8 hours 06/06/2019 1328     05/12/2019 1200  vancomycin (VANCOREADY) IVPB 750 mg/150 mL     750 mg 150 mL/hr over 60 Minutes Intravenous Every Thu (Hemodialysis) 05/24/2019 1126 06/06/2019 1809   05/14/19 2000  DAPTOmycin (CUBICIN) 500 mg in sodium chloride 0.9 % IVPB     500 mg 220 mL/hr over 30 Minutes Intravenous Every 48 hours 05/14/19 1550     05/12/19 1645  vancomycin (VANCOREADY) IVPB 750 mg/150 mL     750 mg 150 mL/hr over 60 Minutes Intravenous  Once 05/12/19 1635 05/12/19 1819   05/12/19 1358  vancomycin variable dose per unstable renal function (pharmacist dosing)  Status:  Discontinued      Does not apply See admin instructions 05/12/19 1358 05/12/2019 1120   05/12/19 1200  vancomycin (VANCOREADY) IVPB 500 mg/100 mL  Status:  Discontinued     500 mg 100 mL/hr over 60 Minutes Intravenous Every M-W-F (Hemodialysis) 05/15/2019 2016 05/09/2019 2018   05/12/19 1200  vancomycin (VANCOREADY) IVPB 750 mg/150 mL  Status:  Discontinued     750 mg 150 mL/hr over 60 Minutes Intravenous Every M-W-F (Hemodialysis) 06/02/2019 2018 05/12/19 1358   05/17/2019 2100  aztreonam (AZACTAM) 1 g in sodium chloride 0.9 % 100 mL IVPB  Status:  Discontinued     1 g 200 mL/hr over 30 Minutes Intravenous Every 24 hours 05/09/2019 2016 05/11/19 1019   05/16/2019 1500  aztreonam (AZACTAM) injection 2 g  Status:  Discontinued     2 g Intramuscular Once 05/14/2019 1411 05/22/2019 1442   05/31/2019 1445  aztreonam (AZACTAM) 2 g in sodium chloride 0.9 % 100 mL IVPB  Status:   Discontinued     2 g 200 mL/hr over 30 Minutes Intravenous  Once 05/08/2019 1442 05/11/2019 2016   06/04/2019 1430  vancomycin (VANCOREADY) IVPB 1250 mg/250 mL     1,250 mg 166.7 mL/hr over 90 Minutes Intravenous  Once 05/17/2019 1411 05/25/2019 1635       Objective: Vitals:   05/24/19 2002 05/24/19 2300 05/25/19 0529 05/25/19 0934  BP: (!) 140/49  (!) 144/66 (!) 155/59  Pulse: 66  (!) 58 63  Resp: 20 (!) 26 (!) 21   Temp: 98.2 F (36.8 C)  97.6 F (36.4 C)   TempSrc: Oral  Oral   SpO2: 93% 97% 99%   Weight:  Height:        Intake/Output Summary (Last 24 hours) at 05/25/2019 1145 Last data filed at 05/25/2019 0900 Gross per 24 hour  Intake 1543.49 ml  Output 0 ml  Net 1543.49 ml   Filed Weights   06/04/2019 1608 05/23/19 0500 05/24/19 0628  Weight: 72 kg 72.5 kg 64.7 kg    Examination: General exam: Appears comfortable  HEENT: PERRLA, oral mucosa moist, no sclera icterus or thrush Respiratory system: Clear to auscultation. Respiratory effort normal. Cardiovascular system: S1 & S2 heard,  No murmurs  Gastrointestinal system: Abdomen soft, non-tender, nondistended. Normal bowel sounds   Central nervous system: Alert and oriented. Does not move left side, has left facial droop. Extremities: No cyanosis, clubbing or edema Skin: No rashes or ulcers Psychiatry:  very flat affect     Data Reviewed: I have personally reviewed following labs and imaging studies  CBC: Recent Labs  Lab 06/05/2019 0624 05/21/2019 0928 06/05/2019 0640  WBC 9.3 7.8 9.2  HGB 6.5* 11.4* 10.1*  HCT 19.6* 34.2* 30.0*  MCV 94.7 90.0 90.4  PLT 134* 138* 161*   Basic Metabolic Panel: Recent Labs  Lab 05/08/2019 0624 05/30/2019 0722 05/20/2019 0640 05/25/2019 1540 05/18/2019 0032  NA 145 148* 145  --  142  K 3.8 5.1 3.6 3.6 3.6  CL 111 118* 111  --  106  CO2 24 21* 25  --  23  GLUCOSE 135* 120* 135*  --  140*  BUN 47* 56* 35*  --  19  CREATININE 3.66* 4.10* 2.92*  --  1.85*  CALCIUM 8.7* 8.8* 8.2*  --   8.3*   GFR: Estimated Creatinine Clearance: 22.8 mL/min (A) (by C-G formula based on SCr of 1.85 mg/dL (H)). Liver Function Tests: Recent Labs  Lab 05/14/2019 0624 05/27/2019 0722 06/05/2019 0640  AST 21 22 18   ALT 18 17 16   ALKPHOS 77 83 82  BILITOT 1.7* 1.7* 1.6*  PROT 5.0* 5.5* 5.5*  ALBUMIN 1.8* 1.9* 2.0*   No results for input(s): LIPASE, AMYLASE in the last 168 hours. No results for input(s): AMMONIA in the last 168 hours. Coagulation Profile: No results for input(s): INR, PROTIME in the last 168 hours. Cardiac Enzymes: Recent Labs  Lab 05/14/2019 0640  CKTOTAL 16*   BNP (last 3 results) No results for input(s): PROBNP in the last 8760 hours. HbA1C: No results for input(s): HGBA1C in the last 72 hours. CBG: Recent Labs  Lab 05/24/19 1630 05/24/19 2133 05/25/19 0743 05/25/19 0918 05/25/19 1130  GLUCAP 134* 164* 143* 149* 155*   Lipid Profile: No results for input(s): CHOL, HDL, LDLCALC, TRIG, CHOLHDL, LDLDIRECT in the last 72 hours. Thyroid Function Tests: No results for input(s): TSH, T4TOTAL, FREET4, T3FREE, THYROIDAB in the last 72 hours. Anemia Panel: No results for input(s): VITAMINB12, FOLATE, FERRITIN, TIBC, IRON, RETICCTPCT in the last 72 hours. Urine analysis:    Component Value Date/Time   COLORURINE YELLOW (A) 05/16/2019 2230   APPEARANCEUR TURBID (A) 05/25/2019 2230   LABSPEC 1.023 05/31/2019 2230   PHURINE 5.0 06/02/2019 2230   GLUCOSEU NEGATIVE 05/14/2019 2230   HGBUR SMALL (A) 06/05/2019 2230   BILIRUBINUR NEGATIVE 06/05/2019 2230   KETONESUR NEGATIVE 05/10/2019 2230   PROTEINUR 100 (A) 05/17/2019 2230   NITRITE NEGATIVE 05/12/2019 2230   LEUKOCYTESUR MODERATE (A) 05/31/2019 2230   Sepsis Labs: @LABRCNTIP (procalcitonin:4,lacticidven:4) ) Recent Results (from the past 240 hour(s))  Culture, blood (Routine X 2) w Reflex to ID Panel     Status: None  Collection Time: 05/16/19 10:45 AM   Specimen: BLOOD  Result Value Ref Range Status    Specimen Description BLOOD RIGHT HAND  Final   Special Requests BLOOD Blood Culture adequate volume  Final   Culture   Final    NO GROWTH 5 DAYS Performed at [facility], [address], [place], [place] [address]    Report Status 05/23/2019 FINAL  Final  Culture, blood (Routine X 2) w Reflex to ID Panel     Status: None   Collection Time: 05/16/19 12:38 PM   Specimen: BLOOD  Result Value Ref Range Status   Specimen Description BLOOD RT HAND  Final   Special Requests   Final    BOTTLES DRAWN AEROBIC AND ANAEROBIC Blood Culture adequate volume   Culture   Final    NO GROWTH 5 DAYS Performed at [facility], [address], [place], [place] [address]    Report Status 05/11/2019 FINAL  Final         Radiology Studies: No results found.    Scheduled Meds: . sodium chloride   Intravenous Once  . allopurinol  100 mg Oral QPM  . amLODipine  2.5 mg Oral Daily  . vitamin C  500 mg Oral BID  . atorvastatin  10 mg Oral q1800  . brimonidine  1 drop Both Eyes Q12H   And  . timolol  1 drop Both Eyes Q12H  . carvedilol  25 mg Oral BID WC  . Chlorhexidine Gluconate Cloth  6 each Topical Daily  . epoetin (EPOGEN/PROCRIT) injection  4,000 Units Intravenous Q M,W,F-HD  . feeding supplement (NEPRO CARB STEADY)  237 mL Oral QID  . insulin aspart  0-15 Units Subcutaneous TID WC  . insulin aspart  0-5 Units Subcutaneous QHS  . latanoprost  1 drop Both Eyes QPM  . levETIRAcetam  1,000 mg Oral QPM  . megestrol  400 mg Oral BID  . midodrine  5 mg Oral Q M,W,F  . mirtazapine  7.5 mg Oral QHS  . multivitamin  1 tablet Oral QHS  . pantoprazole  40 mg Oral QHS  . sertraline  50 mg Oral Daily   Continuous Infusions: . sodium chloride 30 mL (05/25/19 1109)  . ceFTAROline (TEFLARO) IV 200 mg (05/25/19 1114)  . DAPTOmycin (CUBICIN)  IV Stopped (05/24/19 2251)     LOS: 15 days      Debbe Odea, MD [facility] Pager: [email or web address] 05/25/2019, 11:45 AM

## 2019-05-26 ENCOUNTER — Encounter: Admission: EM | Disposition: E | Payer: Self-pay | Source: Home / Self Care | Attending: Internal Medicine

## 2019-05-26 DIAGNOSIS — N186 End stage renal disease: Secondary | ICD-10-CM | POA: Diagnosis not present

## 2019-05-26 DIAGNOSIS — R630 Anorexia: Secondary | ICD-10-CM | POA: Diagnosis not present

## 2019-05-26 DIAGNOSIS — J9601 Acute respiratory failure with hypoxia: Secondary | ICD-10-CM | POA: Diagnosis not present

## 2019-05-26 DIAGNOSIS — R7881 Bacteremia: Secondary | ICD-10-CM | POA: Diagnosis not present

## 2019-05-26 LAB — PHOSPHORUS: Phosphorus: 2.2 mg/dL — ABNORMAL LOW (ref 2.5–4.6)

## 2019-05-26 SURGERY — ECHOCARDIOGRAM, TRANSESOPHAGEAL
Anesthesia: Choice

## 2019-05-26 NOTE — Progress Notes (Signed)
Pt with eyes closed but arousable to verbal stimuli. BFR at 250 due to increased AP.

## 2019-05-26 NOTE — Progress Notes (Signed)
SLP Cancellation Note  Patient Details Name: Crystal Vargas MRN: [id number] DOB: [birth year]   Cancelled treatment:       Reason Eval/Treat Not Completed: (chart reviewed; consulted NSG re: pt's status today). Pt continues to tolerate current dysphagia level 2 (MINCED foods w/ Purees) diet w/ thin liquids; though oral phase time is increased as she tends to exhibit lengthy mastication time -- suspect impacted by declined Cognitive status. No overt s/s of aspiration have been noted by NSG as they help pt w/ her meals. Labs and temps are wnl; no increase in O2 needs.  MD notes indicate pt is still eating minimally at meals; appetite stimulants are in place per report. Pt has had a h/o poor oral intake in the past per chart and Dtr's report. Dietician is currently following.   Recommend continuing to attempt the current diet in order for the appeal of (Minced) solid foods, added purees, and thin liquids w/ drink supplements per Dietician recommendation - drink supplement form will be easiest for pt's consumption. Recommend aspiration precautions; GERD precautions Baseline. Pills in Puree, and feeding support/encouragement at all meals.  NSG to reconsult ST services if any new skilled needs arise.     Orinda Kenner, MS, CCC-SLP Crystal Vargas 05/27/2019, 4:14 PM

## 2019-05-26 NOTE — Progress Notes (Signed)
BFR reduced to 250 due to increased AP. Troubleshooting completed and slight resistance when pulling from arterial line. Dr. Holley Raring at bedside earlier and made aware.

## 2019-05-26 NOTE — Progress Notes (Signed)
HD tx ended 

## 2019-05-26 NOTE — Progress Notes (Signed)
HD treatment started. Pt is alert and oriented.

## 2019-05-26 NOTE — Progress Notes (Signed)
Unable to increase BFR to prescribed due to increased AP

## 2019-05-26 NOTE — Progress Notes (Signed)
PT Cancellation Note  Patient Details Name: Crystal Vargas MRN: [id number] DOB: Jul 02, 1945   Cancelled Treatment:    Reason Eval/Treat Not Completed: Other (comment)(Pt out of room at this time, PT to follow up as able.)   Lieutenant Diego PT, DPT 11:47 AM,06/04/2019

## 2019-05-26 NOTE — Progress Notes (Signed)
[facility]  ROUNDING NOTE   Subjective:  Patient completed dialysis treatment today. Remains quite debilitated overall however. Still not eating very much.  She is eating less than 50% of her meals.   Objective:  Vital signs in last 24 hours:  Temp:  [97.4 F (36.3 C)-98.3 F (36.8 C)] 98.3 F (36.8 C) (04/19 1320) Pulse Rate:  [51-71] 58 (04/19 1320) Resp:  [17-25] 24 (04/19 1320) BP: (127-191)/(51-79) 127/68 (04/19 1320) SpO2:  [95 %-100 %] 100 % (04/19 1320) Weight:  [74.6 kg] 74.6 kg (04/19 0630)  Weight change:  Filed Weights   05/23/19 0500 05/24/19 0628 05/18/2019 0630  Weight: 72.5 kg 64.7 kg 74.6 kg    Intake/Output: I/O last 3 completed shifts: In: 1543.5 [P.O.:200; IV Piggyback:1343.5] Out: 0    Intake/Output this shift:  Total I/O In: 0  Out: 500 [Other:500]  Physical Exam: General: Ill appearing  Head: Normocephalic, atraumatic.   Eyes: Anicteric  Neck: Supple  Lungs:  Clear bilateral, normal effort  Heart: Regular  rhythm  Abdomen:  Soft, nontender   Extremities: no peripheral edema. Feet in soft supports  Neurologic: Lethargic, but arousable  Skin: No lesions  Access: Left IJ PermCath    Basic Metabolic Panel: Recent Labs  Lab 05/12/2019 0722 05/21/2019 0640 06/01/2019 1540 05/12/2019 0032  NA 148* 145  --  142  K 5.1 3.6 3.6 3.6  CL 118* 111  --  106  CO2 21* 25  --  23  GLUCOSE 120* 135*  --  140*  BUN 56* 35*  --  19  CREATININE 4.10* 2.92*  --  1.85*  CALCIUM 8.8* 8.2*  --  8.3*    Liver Function Tests: Recent Labs  Lab 06/01/2019 0722 06/02/2019 0640  AST 22 18  ALT 17 16  ALKPHOS 83 82  BILITOT 1.7* 1.6*  PROT 5.5* 5.5*  ALBUMIN 1.9* 2.0*   No results for input(s): LIPASE, AMYLASE in the last 168 hours. No results for input(s): AMMONIA in the last 168 hours.  CBC: Recent Labs  Lab 05/25/2019 0928 05/31/2019 0640  WBC 7.8 9.2  HGB 11.4* 10.1*  HCT 34.2* 30.0*  MCV 90.0 90.4  PLT 138* 137*    Cardiac  Enzymes: Recent Labs  Lab 05/18/2019 0640  CKTOTAL 16*    BNP: Invalid input(s): POCBNP  CBG: Recent Labs  Lab 05/25/19 0743 05/25/19 0918 05/25/19 1130 05/25/19 1645 05/25/19 2113  GLUCAP 143* 149* 155* 112* 178*    Microbiology: Results for orders placed or performed during the hospital encounter of 05/08/2019  Blood culture (routine x 2)     Status: Abnormal   Collection Time: 05/12/2019 12:34 PM   Specimen: BLOOD  Result Value Ref Range Status   Specimen Description   Final    BLOOD R UP ARM Performed at [facility], [address], [place], [place] [address]    Special Requests   Final    BOTTLES DRAWN AEROBIC AND ANAEROBIC Blood Culture adequate volume Performed at [facility], [address] [place], [place] [address]    Culture  Setup Time   Final    Organism ID to follow IN [place] TO, READ BACK BY AND VERIFIED WITH: [place] ON 05/11/19 AT 0210 [facility] Performed at [facility], [address] [place], [place] [address]    Culture (A)  Final    STAPHYLOCOCCUS AUREUS SUSCEPTIBILITIES PERFORMED ON PREVIOUS CULTURE WITHIN THE  LAST 5 DAYS. Performed at [facility], [place] [address] [place], [place] [address]    Report Status 05/13/2019 FINAL  Final  Blood Culture ID Panel (Reflexed)     Status: Abnormal   Collection Time: 06/05/2019 12:34 PM  Result Value Ref Range Status   Enterococcus species NOT DETECTED NOT DETECTED Final   Listeria monocytogenes NOT DETECTED NOT DETECTED Final   Staphylococcus species DETECTED (A) NOT DETECTED Final    Comment: CRITICAL RESULT CALLED TO, READ BACK BY AND VERIFIED WITH: SCOTT HALL ON 05/11/19 AT 0210 [facility]    Staphylococcus aureus (BCID) DETECTED (A) NOT DETECTED Final    Comment: Methicillin (oxacillin)-resistant Staphylococcus aureus (MRSA). MRSA is predictably resistant to beta-lactam antibiotics (except ceftaroline).  Preferred therapy is vancomycin unless clinically contraindicated. Patient requires contact precautions if  hospitalized. CRITICAL RESULT CALLED TO, READ BACK BY AND VERIFIED WITH: SCOTT HALL ON 05/11/19 AT 0210 [facility]    Methicillin resistance DETECTED (A) NOT DETECTED Final    Comment: CRITICAL RESULT CALLED TO, READ BACK BY AND VERIFIED WITH: SCOTT HALL ON 05/11/19 AT 0210 [facility]    Streptococcus species NOT DETECTED NOT DETECTED Final   Streptococcus agalactiae NOT DETECTED NOT DETECTED Final   Streptococcus pneumoniae NOT DETECTED NOT DETECTED Final   Streptococcus pyogenes NOT DETECTED NOT DETECTED Final   Acinetobacter baumannii NOT DETECTED NOT DETECTED Final   Enterobacteriaceae species NOT DETECTED NOT DETECTED Final   Enterobacter cloacae complex NOT DETECTED NOT DETECTED Final   Escherichia coli NOT DETECTED NOT DETECTED Final   Klebsiella oxytoca NOT DETECTED NOT DETECTED Final   Klebsiella pneumoniae NOT DETECTED NOT DETECTED Final   Proteus species NOT DETECTED NOT DETECTED Final   Serratia marcescens NOT DETECTED NOT DETECTED Final   Haemophilus influenzae NOT DETECTED NOT DETECTED Final   Neisseria meningitidis NOT DETECTED NOT DETECTED Final   Pseudomonas aeruginosa NOT DETECTED NOT DETECTED Final   Candida albicans NOT DETECTED NOT DETECTED Final   Candida glabrata NOT DETECTED NOT DETECTED Final   Candida krusei NOT DETECTED NOT DETECTED Final   Candida parapsilosis NOT DETECTED NOT DETECTED Final   Candida tropicalis NOT DETECTED NOT DETECTED Final    Comment: Performed at [facility], [address] [place], [place] [address]  Blood culture (routine x 2)     Status: Abnormal   Collection Time: 05/23/2019  1:24 PM   Specimen: BLOOD  Result Value Ref Range Status   Specimen Description   Final    BLOOD R LATREAL BICEP Performed at [facility], [address] [place], [place] [address]    Special Requests   Final    BOTTLES DRAWN AEROBIC AND  ANAEROBIC Blood Culture adequate volume Performed at [facility], [address] [place], [place] [address]    Culture  Setup Time   Final    IN BOTH AEROBIC AND ANAEROBIC BOTTLES GRAM POSITIVE COCCI CRITICAL VALUE NOTED.  VALUE IS CONSISTENT WITH PREVIOUSLY REPORTED AND CALLED VALUE. Performed at [facility], [address] [place], [place] [address]    Culture METHICILLIN RESISTANT STAPHYLOCOCCUS AUREUS (A)  Final   Report Status 05/13/2019 FINAL  Final   Organism ID, Bacteria METHICILLIN RESISTANT STAPHYLOCOCCUS AUREUS  Final      Susceptibility   Methicillin resistant staphylococcus aureus - MIC*    CIPROFLOXACIN >=8 RESISTANT Resistant     ERYTHROMYCIN >=8 RESISTANT Resistant     GENTAMICIN <=0.5 SENSITIVE Sensitive     OXACILLIN >=4 RESISTANT Resistant  TETRACYCLINE <=1 SENSITIVE Sensitive     VANCOMYCIN <=0.5 SENSITIVE Sensitive     TRIMETH/SULFA >=320 RESISTANT Resistant     CLINDAMYCIN <=0.25 SENSITIVE Sensitive     RIFAMPIN <=0.5 SENSITIVE Sensitive     Inducible Clindamycin NEGATIVE Sensitive     * METHICILLIN RESISTANT STAPHYLOCOCCUS AUREUS  Respiratory Panel by RT PCR (Flu A&B, Covid) - Nasopharyngeal Swab     Status: None   Collection Time: 05/17/2019  1:24 PM   Specimen: Nasopharyngeal Swab  Result Value Ref Range Status   SARS Coronavirus 2 by RT PCR NEGATIVE NEGATIVE Final    Comment: (NOTE) SARS-CoV-2 target nucleic acids are NOT DETECTED. The SARS-CoV-2 RNA is generally detectable in upper respiratoy specimens during the acute phase of infection. The lowest concentration of SARS-CoV-2 viral copies this assay can detect is 131 copies/mL. A negative result does not preclude SARS-Cov-2 infection and should not be used as the sole basis for treatment or other patient management decisions. A negative result may occur with  improper specimen collection/handling, submission of specimen other than nasopharyngeal swab, presence of viral  mutation(s) within the areas targeted by this assay, and inadequate number of viral copies (<131 copies/mL). A negative result must be combined with clinical observations, patient history, and epidemiological information. The expected result is Negative. Fact Sheet for Patients:  PinkCheek.be Fact Sheet for Healthcare Providers:  GravelBags.it This test is not yet ap proved or cleared by the [place] FDA and  has been authorized for detection and/or diagnosis of SARS-CoV-2 by FDA under an Emergency Use Authorization (EUA). This EUA will remain  in effect (meaning this test can be used) for the duration of the COVID-19 declaration under Section 564(b)(1) of the Act, 21 U.S.C. section 360bbb-3(b)(1), unless the authorization is terminated or revoked sooner.    Influenza A by PCR NEGATIVE NEGATIVE Final   Influenza B by PCR NEGATIVE NEGATIVE Final    Comment: (NOTE) The Xpert Xpress SARS-CoV-2/FLU/RSV assay is intended as an aid in  the diagnosis of influenza from Nasopharyngeal swab specimens and  should not be used as a sole basis for treatment. Nasal washings and  aspirates are unacceptable for Xpert Xpress SARS-CoV-2/FLU/RSV  testing. Fact Sheet for Patients: PinkCheek.be Fact Sheet for Healthcare Providers: GravelBags.it This test is not yet approved or cleared by the [place] FDA and  has been authorized for detection and/or diagnosis of SARS-CoV-2 by  FDA under an Emergency Use Authorization (EUA). This EUA will remain  in effect (meaning this test can be used) for the duration of the  Covid-19 declaration under Section 564(b)(1) of the Act, 21  U.S.C. section 360bbb-3(b)(1), unless the authorization is  terminated or revoked. Performed at [facility], [address] [place], [place] [address]   MRSA PCR Screening     Status: None    Collection Time: 06/04/2019  6:27 PM   Specimen: Nasopharyngeal  Result Value Ref Range Status   MRSA by PCR NEGATIVE NEGATIVE Final    Comment:        The GeneXpert MRSA Assay (FDA approved for NASAL specimens only), is one component of a comprehensive MRSA colonization surveillance program. It is not intended to diagnose MRSA infection nor to guide or monitor treatment for MRSA infections. Performed at [facility], [address], [place], [place] [address]   Urine culture     Status: Abnormal   Collection Time: 05/27/2019 10:30 PM   Specimen: Urine, Clean Catch  Result Value Ref Range Status  Specimen Description   Final    URINE, CLEAN CATCH Performed at [facility], [address] [place], [place] [address]    Special Requests   Final    NONE Performed at [facility], [address] [place], [place] [address]    Culture (A)  Final    >=100,000 COLONIES/mL KLEBSIELLA PNEUMONIAE >=100,000 COLONIES/mL VANCOMYCIN RESISTANT ENTEROCOCCUS    Report Status 05/14/2019 FINAL  Final   Organism ID, Bacteria KLEBSIELLA PNEUMONIAE (A)  Final   Organism ID, Bacteria VANCOMYCIN RESISTANT ENTEROCOCCUS (A)  Final      Susceptibility   Klebsiella pneumoniae - MIC*    AMPICILLIN >=32 RESISTANT Resistant     CEFAZOLIN <=4 SENSITIVE Sensitive     CEFTRIAXONE <=0.25 SENSITIVE Sensitive     CIPROFLOXACIN <=0.25 SENSITIVE Sensitive     GENTAMICIN <=1 SENSITIVE Sensitive     IMIPENEM 0.5 SENSITIVE Sensitive     NITROFURANTOIN 64 INTERMEDIATE Intermediate     TRIMETH/SULFA <=20 SENSITIVE Sensitive     AMPICILLIN/SULBACTAM 4 SENSITIVE Sensitive     PIP/TAZO <=4 SENSITIVE Sensitive     * >=100,000 COLONIES/mL KLEBSIELLA PNEUMONIAE   Vancomycin resistant enterococcus - MIC*    AMPICILLIN >=32 RESISTANT Resistant     NITROFURANTOIN 256 RESISTANT Resistant     VANCOMYCIN >=32 RESISTANT Resistant     LINEZOLID 2 SENSITIVE Sensitive     * >=100,000 COLONIES/mL  VANCOMYCIN RESISTANT ENTEROCOCCUS  Cath Tip Culture     Status: Abnormal   Collection Time: 05/11/19  1:10 PM   Specimen: Catheter Tip; Other  Result Value Ref Range Status   Specimen Description   Final    CATH TIP Performed at [facility], [address] [place], [place] [address]    Special Requests   Final    NONE Performed at [facility], [address] [place], [place] [address]    Culture (A)  Final    >=100,000 COLONIES/mL METHICILLIN RESISTANT STAPHYLOCOCCUS AUREUS   Report Status 05/13/2019 FINAL  Final   Organism ID, Bacteria METHICILLIN RESISTANT STAPHYLOCOCCUS AUREUS (A)  Final      Susceptibility   Methicillin resistant staphylococcus aureus - MIC*    CIPROFLOXACIN >=8 RESISTANT Resistant     ERYTHROMYCIN >=8 RESISTANT Resistant     GENTAMICIN <=0.5 SENSITIVE Sensitive     OXACILLIN >=4 RESISTANT Resistant     TETRACYCLINE <=1 SENSITIVE Sensitive     VANCOMYCIN 1 SENSITIVE Sensitive     TRIMETH/SULFA >=320 RESISTANT Resistant     CLINDAMYCIN <=0.25 SENSITIVE Sensitive     RIFAMPIN <=0.5 SENSITIVE Sensitive     Inducible Clindamycin NEGATIVE Sensitive     * >=100,000 COLONIES/mL METHICILLIN RESISTANT STAPHYLOCOCCUS AUREUS  Culture, blood (routine x 2)     Status: Abnormal   Collection Time: 05/11/19  4:27 PM   Specimen: BLOOD  Result Value Ref Range Status   Specimen Description   Final    BLOOD A-LINE Performed at [facility], [address] [place], [place] [address]    Special Requests   Final    BOTTLES DRAWN AEROBIC AND ANAEROBIC Blood Culture adequate volume Performed at [facility], [address] [place], [place] [address]    Culture  Setup Time   Final    GRAM POSITIVE COCCI ANAEROBIC BOTTLE ONLY CRITICAL VALUE NOTED.  VALUE IS CONSISTENT WITH PREVIOUSLY REPORTED AND CALLED VALUE. Performed at [facility], [address], [place], [place] [address]    Culture (A)  Final  STAPHYLOCOCCUS  AUREUS SUSCEPTIBILITIES PERFORMED ON PREVIOUS CULTURE WITHIN THE LAST 5 DAYS. Performed at [facility], [place] [address], [place], [place] [address]    Report Status 05/09/2019 FINAL  Final  Culture, blood (routine x 2)     Status: Abnormal   Collection Time: 05/12/19  6:06 AM   Specimen: BLOOD  Result Value Ref Range Status   Specimen Description   Final    BLOOD BLOOD RIGHT HAND Performed at [facility], [address], [place], [place] [address]    Special Requests   Final    BOTTLES DRAWN AEROBIC AND ANAEROBIC Blood Culture adequate volume Performed at [facility], [address] [place], [place] [address]    Culture  Setup Time   Final    GRAM POSITIVE COCCI ANAEROBIC BOTTLE ONLY CRITICAL RESULT CALLED TO, READ BACK BY AND VERIFIED WITH: [place] ON 05/12/2019 BY MOSLEY,J Performed at [facility], [address] [place], [place] [address]    Culture METHICILLIN RESISTANT STAPHYLOCOCCUS AUREUS (A)  Final   Report Status 05/25/2019 FINAL  Final   Organism ID, Bacteria METHICILLIN RESISTANT STAPHYLOCOCCUS AUREUS  Final      Susceptibility   Methicillin resistant staphylococcus aureus - MIC*    CIPROFLOXACIN >=8 RESISTANT Resistant     ERYTHROMYCIN >=8 RESISTANT Resistant     GENTAMICIN <=0.5 SENSITIVE Sensitive     OXACILLIN >=4 RESISTANT Resistant     TETRACYCLINE <=1 SENSITIVE Sensitive     VANCOMYCIN 1 SENSITIVE Sensitive     TRIMETH/SULFA >=320 RESISTANT Resistant     CLINDAMYCIN <=0.25 SENSITIVE Sensitive     RIFAMPIN <=0.5 SENSITIVE Sensitive     Inducible Clindamycin NEGATIVE Sensitive     * METHICILLIN RESISTANT STAPHYLOCOCCUS AUREUS  CULTURE, BLOOD (ROUTINE X 2) w Reflex to ID Panel     Status: Abnormal   Collection Time: 05/13/19  2:20 PM   Specimen: BLOOD  Result Value Ref Range Status   Specimen Description   Final    BLOOD BLOOD RIGHT HAND Performed at [facility], [address],  [place], [place] [address]    Special Requests   Final    BOTTLES DRAWN AEROBIC AND ANAEROBIC Blood Culture adequate volume Performed at [facility], [address] [place], [place] [address]    Culture  Setup Time   Final    GRAM POSITIVE COCCI ANAEROBIC BOTTLE ONLY CRITICAL RESULT CALLED TO, READ BACK BY AND VERIFIED WITH: [facility] KATSOUDAS AT 4097 05/14/19 SDR Performed at [facility], [address] [place], [place] [address]    Culture (A)  Final    STAPHYLOCOCCUS AUREUS SUSCEPTIBILITIES PERFORMED ON PREVIOUS CULTURE WITHIN THE LAST 5 DAYS. Performed at [facility], [place] [address], [place], [place] [address]    Report Status 05/16/2019 FINAL  Final  CULTURE, BLOOD (ROUTINE X 2) w Reflex to ID Panel     Status: None   Collection Time: 05/13/19  3:39 PM   Specimen: BLOOD  Result Value Ref Range Status   Specimen Description BLOOD BLOOD LEFT HAND  Final   Special Requests   Final    BOTTLES DRAWN AEROBIC ONLY Blood Culture adequate volume   Culture   Final    NO GROWTH 5 DAYS Performed at [facility], [address] [place],  [address]    Report Status 05/18/2019 FINAL  Final  CULTURE, BLOOD (ROUTINE X 2) w Reflex to ID Panel  Status: Abnormal   Collection Time: 05/14/19  3:33 PM   Specimen: BLOOD  Result Value Ref Range Status   Specimen Description   Final    BLOOD BLOOD RIGHT HAND Performed at [facility], [address], [place], [place] [address]    Special Requests   Final    BOTTLES DRAWN AEROBIC AND ANAEROBIC Blood Culture adequate volume Performed at [facility], [address] [place], [place] [address]    Culture  Setup Time   Final    GRAM POSITIVE COCCI IN CLUSTERS ANAEROBIC BOTTLE ONLY CRITICAL VALUE NOTED.  VALUE IS CONSISTENT WITH PREVIOUSLY REPORTED AND CALLED VALUE.    Culture (A)  Final    STAPHYLOCOCCUS AUREUS SEE SEPARATE REPORT IN [facility] FOR DOS 05/14/19. Performed at [facility], [place] [address] [place], [place] [address]    Report Status 05/24/2019 FINAL  Final  Cath Tip Culture     Status: None   Collection Time: 05/14/19  4:24 PM   Specimen: Catheter Tip; Other  Result Value Ref Range Status   Specimen Description   Final    CATH TIP Performed at [facility], [address] [place], [place] [address]    Special Requests   Final    NONE Performed at [facility], [address], [place], [place] [address]    Culture   Final    NO GROWTH 3 DAYS Performed at [facility], [place] [address] [place], [place] [address]    Report Status 05/17/2019 FINAL  Final  CULTURE, BLOOD (ROUTINE X 2) w Reflex to ID Panel     Status: Abnormal   Collection Time: 05/14/19  6:25 PM   Specimen: BLOOD  Result Value Ref Range Status   Specimen Description   Final    BLOOD BLOOD LEFT HAND Performed at [facility], [address] [place], [place] [address]    Special Requests   Final    BOTTLES DRAWN AEROBIC AND ANAEROBIC Blood Culture results may not be optimal due to an inadequate volume of blood received in culture bottles Performed at [facility], [address], [place], [place] [address]    Culture  Setup Time   Final    GRAM POSITIVE COCCI ANAEROBIC BOTTLE ONLY CRITICAL VALUE NOTED.  VALUE IS CONSISTENT WITH PREVIOUSLY REPORTED AND CALLED VALUE. [place] Performed at [facility], [address] [place], [place] [address]    Culture (A)  Final    STAPHYLOCOCCUS AUREUS SUSCEPTIBILITIES PERFORMED ON PREVIOUS CULTURE WITHIN THE LAST 5 DAYS. Performed at [facility], [place] [address], [place], [place] [address]    Report Status 05/17/2019 FINAL  Final  Culture, blood (Routine X 2) w Reflex to ID Panel     Status: None   Collection Time: 05/16/19 10:45 AM   Specimen: BLOOD  Result Value Ref Range Status   Specimen Description BLOOD RIGHT HAND  Final   Special Requests BLOOD Blood Culture adequate  volume  Final   Culture   Final    NO GROWTH 5 DAYS Performed at [facility], [address], [place], [place] [address]    Report Status 06/06/2019 FINAL  Final  Culture, blood (Routine X 2) w Reflex to ID Panel     Status: None   Collection Time: 05/16/19 12:38 PM   Specimen: BLOOD  Result Value Ref Range Status   Specimen Description BLOOD RT HAND  Final   Special  Requests   Final    BOTTLES DRAWN AEROBIC AND ANAEROBIC Blood Culture adequate volume   Culture   Final    NO GROWTH 5 DAYS Performed at [facility], [address] [place], [place] [address]    Report Status 05/16/2019 FINAL  Final    Coagulation Studies: No results for input(s): LABPROT, INR in the last 72 hours.  Urinalysis: No results for input(s): COLORURINE, LABSPEC, PHURINE, GLUCOSEU, HGBUR, BILIRUBINUR, KETONESUR, PROTEINUR, UROBILINOGEN, NITRITE, LEUKOCYTESUR in the last 72 hours.  Invalid input(s): APPERANCEUR    Imaging: No results found.   Medications:   . sodium chloride 20 mL (05/14/2019 1339)  . ceFTAROline (TEFLARO) IV 200 mg (05/09/2019 1340)  . DAPTOmycin (CUBICIN)  IV Stopped (05/24/19 2251)   . sodium chloride   Intravenous Once  . allopurinol  100 mg Oral QPM  . amLODipine  2.5 mg Oral Daily  . vitamin C  500 mg Oral BID  . atorvastatin  10 mg Oral q1800  . brimonidine  1 drop Both Eyes Q12H   And  . timolol  1 drop Both Eyes Q12H  . carvedilol  25 mg Oral BID WC  . Chlorhexidine Gluconate Cloth  6 each Topical Daily  . epoetin (EPOGEN/PROCRIT) injection  4,000 Units Intravenous Q M,W,F-HD  . feeding supplement (NEPRO CARB STEADY)  237 mL Oral QID  . latanoprost  1 drop Both Eyes QPM  . levETIRAcetam  1,000 mg Oral QPM  . megestrol  400 mg Oral BID  . midodrine  5 mg Oral Q M,W,F  . mirtazapine  7.5 mg Oral QHS  . multivitamin  1 tablet Oral QHS  . pantoprazole  40 mg Oral QHS  . sertraline  50 mg Oral Daily     so far Assessment/ Plan:  [name]  is a 74 y.o. black female with end stage renal disease on hemodialysis, CVA with left hemiparesis, hypertension, hyperlipidemia, GERD, glaucoma, diabetes mellitus type II, coronary artery disease, gout, seizure disorder who was admitted to [facility] on 05/18/2019 for Hypokalemia [E87.6] Shock ([place]) [R57.9] ESRD (end stage renal disease) ([place]) [N18.6] Hypotension, unspecified hypotension type [I95.9] Anemia, unspecified type [D64.9]  [facility] MWF  63.5kg  # End Stage Renal Disease:  Patient completed dialysis treatment today.  Next Alysis treatment for Wednesday.  # Hypotension: with MRSA bacteremia/sepsis Persistent positive blood cultures from 4/7.   Neg results from 4/9 so far - Continue Teflaro and daptomycin. As per ID recommendations   -Daptomycin approved on the outpatient basis.    #. Anemia with chronic kidney disease:  Lab Results  Component Value Date   HGB 10.1 (L) 05/16/2019  Hemoglobin at target at 10.1.  Maintain the patient on current dosage of Epogen 4000 units with dialysis treatments.  # Secondary Hyperparathyroidism:  Off binders at present Lab Results  Component Value Date   PTH 232 (H) 05/11/2019   CALCIUM 8.3 (L) 05/12/2019   PHOS 1.3 (L) 05/16/2019  Awaiting repeat phosphorus.  #Malnutrition:   Currently on pureed diet albumin 2.0 on 05/18/2019 Eating less than 50% of her meals.  Overall prognosis remains quite guarded given underlying malnutrition.     LOS: 16 Curt O[place] 4/19/20212:57 PM

## 2019-05-26 NOTE — Progress Notes (Signed)
BFR less than ordered due to increased AP.

## 2019-05-26 NOTE — Progress Notes (Signed)
Slowly increasing BFR to prescribed. MD made aware.

## 2019-05-26 NOTE — Progress Notes (Signed)
ID MRSA bacteremia On ceftaroline + dapto as she failed vanco Had persistent bacteremia 4/3-4/7 and 4/9 NG Awaiting TEE

## 2019-05-26 NOTE — Progress Notes (Signed)
Calorie Count Note  48 hour calorie count ordered.  Diet: Dysphagia 2 with thin liquids Supplements: Nepro Shake po QID, each supplement provides 425 kcal and 19 grams protein  Day 1: Breakfast 4/17: 43 kcal, 2 grams of protein (10% of meal per chart; no meal ticket collected as this was prior to when calorie count was ordered) Lunch 4/17: N/A (0% of meal) Dinner 4/17: N/A (0% of meal) Supplements: 638 kcal, 29 grams of protein (1.5 bottles of Nepro)  Total intake Day 1: 681 kcal (43% of minimum estimated needs)  31 grams of protein (39% of minimum estimated needs)  Day 2: Breakfast: 49 kcal, 1 gram of protein (50% orange juice, 10% [place] vanilla yogurt, 5% puree mixed berries, 5% breakfast potatoes minced) Lunch: 11 kcal, 0 grams of protein (10% unsweet iced tea, 10% mashed potatoes) Dinner: 15 kcal, 0.3 grams of protein (10% poultry gravy, 10% mashed potatoes, 5% minced carrots) Supplements: 213 kcal, 10 grams of protein (1/2 bottle of Nepro)  Total intake Day 2: 288 kcal (18% of minimum estimated needs)  11 grams of protein (14% of minimum estimated needs)  Estimated Nutritional Needs:  Kcal:  1600-1800kcal/day Protein:  80-90g/day Fluid:  UOP +1L  Nutrition Dx: Inadequate oral intake related to poor appetite, dysphagia as evidenced by meal completion < 50%.  Goal: Patient will meet greater than or equal to 90% of their needs   Intervention:  -Continue Nepro Shake po QID, each supplement provides 425 kcal and 19 grams protein. -Continue vitamin C 500 mg po BID and Rena-vite daily. -According to chart patient reports she would not want a feeding tube.  Jacklynn Barnacle, MS, RD, LDN Pager number available on Amion

## 2019-05-26 NOTE — TOC Progression Note (Signed)
Transition of Care [facility]) - Progression Note    Patient Details  Name: Crystal Vargas MRN: [id number] Date of Birth: [birth year]  [facility]) CM/SW Contact  Beverly Sessions, RN Phone Number: 05/11/2019, 3:53 PM  Clinical Narrative:     Per Audry Pili at [facility] patient is managed by [facility].  Will not require insurance prior to discharge.  States that there is an [employer] person on site that will obtain auth after patient returns    Expected Discharge Plan: [place] Barriers to Discharge: Continued Medical Work up  Expected Discharge Plan and Services Expected Discharge Plan: [place] arrangements for the past 2 months: [place]                                       Social Determinants of Health (SDOH) Interventions    Readmission Risk Interventions No flowsheet data found.

## 2019-05-26 NOTE — Progress Notes (Signed)
PROGRESS NOTE    Crystal Vargas   TOI:[id number]  DOB: 1945-10-06  DOA: 05/31/2019 PCP: Marisa Hua, MD   Brief Narrative:  Crystal Vargas is a 74 y.o. female who is bed bound and from SNF with history of end-stage renal disease recently started on hemodialysis, history of right MCA stroke, history of dysphagia due to Schatzki's ring and is status post esophageal dilatation who was admitted with altered mental status.  Her daughter noted poor PO intake thought to be due to dysphagia.  In the ED, she was hypotensive and had a K of 2.1 and a Hb of 6.9.  She was given IVF, IV Abx and admitted to the ICU.   Subjective: Continues to have poor appetite.    Assessment & Plan:   Principal Problem:   MRSA bacteremia/ septic shock - due to infected dialysis cath - appreciate ID assistance- currently on Daptomycin and Teflaro- awating approval for dapto - repeat cultures 4/9 negative - receiving dialysis currently- Cardiology trying to see if they can find a spot for her today to have TEE today   Active Problems: Dysphagia- chronic issue - EGD performed 4/14 revealed and esophageal stenosis that was dilated  - OK to proceed with TEE for above  Poor oral intake  eating < 50 % of meals  - 4/17> continue to barely eat meals - 4/17>- added Megace- increased Nepro- started calorie count - She does not want a feeding tube  -4/17- calorie count> about 300 kcal taken in - 4/18>  228 Kcal taken in - she continues to decline a feeding tube and is not willing to discuss hospice and palliative care- I spoke with her daughter Crystal Mylar in detail about this  Acute hypoxic respiratory failure - 4/15> on 3-4 L of O 2 with pulse ox in mid 90s - RR in high 20-low 30 range - Have asked cardiology to hold off on TTE in light of these findings - fluid removed with dialysis on 4/16- no longer hypoxic    ESRD on HD - infected cath removed and tunnelled HD cath placed on 4/12 -cont dialysis per renal  team - MWF dialysis with [facility]  H/o HTN - meds being titrated by Renal  Acute anemia (possible related to infection/ acute illness) in addition to CKD - has been transfused 3 U PRBC so far - 4/3 (1 U) and 4/12 ( 2 U)    Diabetes Mellitus 2 - resolved?  - A1c checked on 4/3 was 5.6 and she was not on medications as outpt - CBG in hospital are not elevated- she is barely eating- d/c SSI and CBGs   Status post CVA  - bed bound with left sided weakness (0/5)and left facial droop  Seizure disorder - cont Keppra-    Mild thrombocytopenia - possible due to sepsis- follow  Depression?  - has a flat affect, poor appetite, barely eating but states she is not depressed - cont Remeron and Zoloft  Time spent in minutes: 35 DVT prophylaxis: SCDs Code Status: Full code Family Communication: speaking with daughter Crystal Vargas Disposition Plan: return to SNF eventually after TEE- waiting to see if  TEE is done today- hopefully can go to SNF tomorrow- prognosis is poor a her oral intake is negligible and is likely related to severe depression- attempts to have palliative care conversations have failed thus far Consultants:  Nephro CArdiology GI ID Palliative care Procedures:   4/14- EGD Benign-appearing esophageal stenosis  4/12- left IJ  2  D ECHO 1. Left ventricular ejection fraction, by estimation, is 45 to 50%. The  left ventricle has mildly decreased function. The left ventricle has no  regional wall motion abnormalities. There is moderate concentric left  ventricular hypertrophy. Left  ventricular diastolic parameters are consistent with Grade I diastolic  dysfunction (impaired relaxation).  2. Right ventricular systolic function is normal. The right ventricular  size is mildly enlarged. There is moderately elevated pulmonary artery  systolic pressure.  3. Left atrial size was mildly dilated.  4. Right atrial size was mildly dilated.  5. The mitral valve was  not well visualized. Mild to moderate mitral  valve regurgitation.  6. Tricuspid valve regurgitation is moderate to severe.  7. The aortic valve is grossly normal. Aortic valve regurgitation is  trivial. No aortic stenosis is present.  Antimicrobials:  Anti-infectives (From admission, onward)   Start     Dose/Rate Route Frequency Ordered Stop   06/02/2019 1645  clindamycin (CLEOCIN) IVPB 300 mg  Status:  Discontinued     300 mg 100 mL/hr over 30 Minutes Intravenous  Once 06/01/2019 1642 05/23/2019 1804   05/25/2019 1645  clindamycin (CLEOCIN) 300 MG/50ML IVPB    Note to Pharmacy: Genelle Bal   : cabinet override      05/18/2019 1645 05/18/2019 0459   06/01/2019 1600  ceftaroline (TEFLARO) 200 mg in sodium chloride 0.9 % 250 mL IVPB     200 mg 250 mL/hr over 60 Minutes Intravenous Every 8 hours 05/17/2019 1328     05/30/2019 1200  vancomycin (VANCOREADY) IVPB 750 mg/150 mL     750 mg 150 mL/hr over 60 Minutes Intravenous Every Thu (Hemodialysis) 06/06/2019 1126 06/05/2019 1809   05/14/19 2000  DAPTOmycin (CUBICIN) 500 mg in sodium chloride 0.9 % IVPB     500 mg 220 mL/hr over 30 Minutes Intravenous Every 48 hours 05/14/19 1550     05/12/19 1645  vancomycin (VANCOREADY) IVPB 750 mg/150 mL     750 mg 150 mL/hr over 60 Minutes Intravenous  Once 05/12/19 1635 05/12/19 1819   05/12/19 1358  vancomycin variable dose per unstable renal function (pharmacist dosing)  Status:  Discontinued      Does not apply See admin instructions 05/12/19 1358 05/24/2019 1120   05/12/19 1200  vancomycin (VANCOREADY) IVPB 500 mg/100 mL  Status:  Discontinued     500 mg 100 mL/hr over 60 Minutes Intravenous Every M-W-F (Hemodialysis) 05/15/2019 2016 06/05/2019 2018   05/12/19 1200  vancomycin (VANCOREADY) IVPB 750 mg/150 mL  Status:  Discontinued     750 mg 150 mL/hr over 60 Minutes Intravenous Every M-W-F (Hemodialysis) 05/08/2019 2018 05/12/19 1358   05/18/2019 2100  aztreonam (AZACTAM) 1 g in sodium chloride 0.9 % 100 mL IVPB  Status:   Discontinued     1 g 200 mL/hr over 30 Minutes Intravenous Every 24 hours 06/05/2019 2016 05/11/19 1019   06/02/2019 1500  aztreonam (AZACTAM) injection 2 g  Status:  Discontinued     2 g Intramuscular Once 05/19/2019 1411 06/05/2019 1442   05/18/2019 1445  aztreonam (AZACTAM) 2 g in sodium chloride 0.9 % 100 mL IVPB  Status:  Discontinued     2 g 200 mL/hr over 30 Minutes Intravenous  Once 05/23/2019 1442 05/28/2019 2016   06/02/2019 1430  vancomycin (VANCOREADY) IVPB 1250 mg/250 mL     1,250 mg 166.7 mL/hr over 90 Minutes Intravenous  Once 05/14/2019 1411 05/13/2019 1635       Objective: Vitals:   05/30/2019 0945  06/01/2019 1000 05/25/2019 1015 05/13/2019 1030  BP: (!) 157/64 (!) 157/60 (!) 157/70 (!) 155/62  Pulse: 60 (!) 57 (!) 58 (!) 57  Resp: (!) 22 20 (!) 22 (!) 21  Temp:      TempSrc:      SpO2: 98% 100% 100% 100%  Weight:      Height:        Intake/Output Summary (Last 24 hours) at 05/24/2019 1047 Last data filed at 05/13/2019 0900 Gross per 24 hour  Intake 0 ml  Output --  Net 0 ml   Filed Weights   05/23/19 0500 05/24/19 0628 06/04/2019 0630  Weight: 72.5 kg 64.7 kg 74.6 kg    Examination: General exam: Appears comfortable  HEENT: PERRLA, oral mucosa moist, no sclera icterus or thrush Respiratory system: Clear to auscultation. Respiratory effort normal. Cardiovascular system: S1 & S2 heard,  No murmurs  Gastrointestinal system: Abdomen soft, non-tender, nondistended. Normal bowel sounds   Central nervous system: Alert and oriented. Left side 0/5 weakness, left facial droop Extremities: No cyanosis, clubbing or edema Skin: No rashes or ulcers Psychiatry:  flat affect   Data Reviewed: I have personally reviewed following labs and imaging studies  CBC: Recent Labs  Lab 05/09/2019 0928 05/25/2019 0640  WBC 7.8 9.2  HGB 11.4* 10.1*  HCT 34.2* 30.0*  MCV 90.0 90.4  PLT 138* 119*   Basic Metabolic Panel: Recent Labs  Lab 05/17/2019 0722 06/06/2019 0640 06/04/2019 1540 06/04/2019 0032   NA 148* 145  --  142  K 5.1 3.6 3.6 3.6  CL 118* 111  --  106  CO2 21* 25  --  23  GLUCOSE 120* 135*  --  140*  BUN 56* 35*  --  19  CREATININE 4.10* 2.92*  --  1.85*  CALCIUM 8.8* 8.2*  --  8.3*   GFR: Estimated Creatinine Clearance: 24.5 mL/min (A) (by C-G formula based on SCr of 1.85 mg/dL (H)). Liver Function Tests: Recent Labs  Lab 05/30/2019 0722 05/16/2019 0640  AST 22 18  ALT 17 16  ALKPHOS 83 82  BILITOT 1.7* 1.6*  PROT 5.5* 5.5*  ALBUMIN 1.9* 2.0*   No results for input(s): LIPASE, AMYLASE in the last 168 hours. No results for input(s): AMMONIA in the last 168 hours. Coagulation Profile: No results for input(s): INR, PROTIME in the last 168 hours. Cardiac Enzymes: Recent Labs  Lab 05/11/2019 0640  CKTOTAL 16*   BNP (last 3 results) No results for input(s): PROBNP in the last 8760 hours. HbA1C: No results for input(s): HGBA1C in the last 72 hours. CBG: Recent Labs  Lab 05/25/19 0743 05/25/19 0918 05/25/19 1130 05/25/19 1645 05/25/19 2113  GLUCAP 143* 149* 155* 112* 178*   Lipid Profile: No results for input(s): CHOL, HDL, LDLCALC, TRIG, CHOLHDL, LDLDIRECT in the last 72 hours. Thyroid Function Tests: No results for input(s): TSH, T4TOTAL, FREET4, T3FREE, THYROIDAB in the last 72 hours. Anemia Panel: No results for input(s): VITAMINB12, FOLATE, FERRITIN, TIBC, IRON, RETICCTPCT in the last 72 hours. Urine analysis:    Component Value Date/Time   COLORURINE YELLOW (A) 05/11/2019 2230   APPEARANCEUR TURBID (A) 05/08/2019 2230   LABSPEC 1.023 05/30/2019 2230   PHURINE 5.0 06/01/2019 2230   GLUCOSEU NEGATIVE 06/06/2019 2230   HGBUR SMALL (A) 05/13/2019 2230   BILIRUBINUR NEGATIVE 05/28/2019 2230   KETONESUR NEGATIVE 05/14/2019 2230   PROTEINUR 100 (A) 05/12/2019 2230   NITRITE NEGATIVE 05/18/2019 2230   LEUKOCYTESUR MODERATE (A) 05/30/2019 2230   Sepsis  Labs: @LABRCNTIP (procalcitonin:4,lacticidven:4) ) Recent Results (from the past 240 hour(s))   Culture, blood (Routine X 2) w Reflex to ID Panel     Status: None   Collection Time: 05/16/19 12:38 PM   Specimen: BLOOD  Result Value Ref Range Status   Specimen Description BLOOD RT HAND  Final   Special Requests   Final    BOTTLES DRAWN AEROBIC AND ANAEROBIC Blood Culture adequate volume   Culture   Final    NO GROWTH 5 DAYS Performed at [facility], [address] [place],  [address]    Report Status 05/11/2019 FINAL  Final         Radiology Studies: No results found.    Scheduled Meds: . sodium chloride   Intravenous Once  . allopurinol  100 mg Oral QPM  . amLODipine  2.5 mg Oral Daily  . vitamin C  500 mg Oral BID  . atorvastatin  10 mg Oral q1800  . brimonidine  1 drop Both Eyes Q12H   And  . timolol  1 drop Both Eyes Q12H  . carvedilol  25 mg Oral BID WC  . Chlorhexidine Gluconate Cloth  6 each Topical Daily  . epoetin (EPOGEN/PROCRIT) injection  4,000 Units Intravenous Q M,W,F-HD  . feeding supplement (NEPRO CARB STEADY)  237 mL Oral QID  . latanoprost  1 drop Both Eyes QPM  . levETIRAcetam  1,000 mg Oral QPM  . megestrol  400 mg Oral BID  . midodrine  5 mg Oral Q M,W,F  . mirtazapine  7.5 mg Oral QHS  . multivitamin  1 tablet Oral QHS  . pantoprazole  40 mg Oral QHS  . sertraline  50 mg Oral Daily   Continuous Infusions: . sodium chloride 20 mL (05/25/19 1856)  . ceFTAROline (TEFLARO) IV 200 mg (05/30/2019 0332)  . DAPTOmycin (CUBICIN)  IV Stopped (05/24/19 2251)     LOS: 16 days      Debbe Odea, MD [facility] Pager: [email or web address] 05/16/2019, 10:47 AM

## 2019-05-27 ENCOUNTER — Inpatient Hospital Stay
Admit: 2019-05-27 | Discharge: 2019-05-27 | Disposition: A | Discharge status: Home / Self Care | Payer: Medicare Other | Attending: Cardiology | Admitting: Cardiology

## 2019-05-27 ENCOUNTER — Encounter: Admission: EM | Disposition: E | Payer: Self-pay | Source: Home / Self Care | Attending: Internal Medicine

## 2019-05-27 ENCOUNTER — Inpatient Hospital Stay: Payer: Medicare Other | Admitting: Anesthesiology

## 2019-05-27 ENCOUNTER — Other Ambulatory Visit (INDEPENDENT_AMBULATORY_CARE_PROVIDER_SITE_OTHER): Payer: Self-pay | Admitting: Vascular Surgery

## 2019-05-27 DIAGNOSIS — J9600 Acute respiratory failure, unspecified whether with hypoxia or hypercapnia: Secondary | ICD-10-CM | POA: Diagnosis not present

## 2019-05-27 DIAGNOSIS — B9562 Methicillin resistant Staphylococcus aureus infection as the cause of diseases classified elsewhere: Secondary | ICD-10-CM | POA: Diagnosis not present

## 2019-05-27 DIAGNOSIS — R7881 Bacteremia: Secondary | ICD-10-CM | POA: Diagnosis not present

## 2019-05-27 DIAGNOSIS — L8990 Pressure ulcer of unspecified site, unspecified stage: Secondary | ICD-10-CM | POA: Insufficient documentation

## 2019-05-27 DIAGNOSIS — N186 End stage renal disease: Secondary | ICD-10-CM | POA: Diagnosis not present

## 2019-05-27 DIAGNOSIS — R579 Shock, unspecified: Secondary | ICD-10-CM | POA: Diagnosis not present

## 2019-05-27 HISTORY — PX: TEE WITHOUT CARDIOVERSION: SHX5443

## 2019-05-27 LAB — CULTURE, BLOOD (ROUTINE X 2): Special Requests: ADEQUATE

## 2019-05-27 LAB — RESPIRATORY PANEL BY RT PCR (FLU A&B, COVID)
Influenza A by PCR: NEGATIVE
Influenza B by PCR: NEGATIVE
SARS Coronavirus 2 by RT PCR: NEGATIVE

## 2019-05-27 SURGERY — ECHOCARDIOGRAM, TRANSESOPHAGEAL
Anesthesia: General

## 2019-05-27 MED ORDER — POLYETHYLENE GLYCOL 3350 17 G PO PACK: 17.0000 g | PACK | Freq: Every day | ORAL | 0 refills | Status: AC

## 2019-05-27 MED ORDER — ASCORBIC ACID 500 MG PO TABS: 500.0000 mg | ORAL_TABLET | Freq: Two times a day (BID) | ORAL | Status: AC

## 2019-05-27 MED ORDER — RENA-VITE PO TABS: 1.0000 | ORAL_TABLET | Freq: Every day | ORAL | 0 refills | Status: AC

## 2019-05-27 MED ORDER — MIDODRINE HCL 5 MG PO TABS: 5.0000 mg | ORAL_TABLET | ORAL | Status: AC

## 2019-05-27 MED ORDER — BUTAMBEN-TETRACAINE-BENZOCAINE 2-2-14 % EX AERO
INHALATION_SPRAY | CUTANEOUS | Status: AC
  Administered 2019-05-27: 2
  Filled 2019-05-27: qty 5

## 2019-05-27 MED ORDER — PROPOFOL 10 MG/ML IV BOLUS
INTRAVENOUS | Status: DC | PRN
  Administered 2019-05-27: 10 mg via INTRAVENOUS
  Administered 2019-05-27: 20 mg via INTRAVENOUS
  Administered 2019-05-27 (×2): 10 mg via INTRAVENOUS
  Administered 2019-05-27: 40 mg via INTRAVENOUS

## 2019-05-27 MED ORDER — LIDOCAINE VISCOUS HCL 2 % MT SOLN
OROMUCOSAL | Status: AC
  Filled 2019-05-27: qty 15

## 2019-05-27 MED ORDER — MEGESTROL ACETATE 400 MG/10ML PO SUSP: 400.0000 mg | Freq: Two times a day (BID) | ORAL | 0 refills | Status: AC

## 2019-05-27 MED ORDER — NEPRO/CARBSTEADY PO LIQD: 237.0000 mL | Freq: Four times a day (QID) | ORAL | 0 refills | Status: AC

## 2019-05-27 MED ORDER — SODIUM CHLORIDE 0.9 % IV SOLN: INTRAVENOUS | Status: DC

## 2019-05-27 MED ORDER — SODIUM CHLORIDE FLUSH 0.9 % IV SOLN
INTRAVENOUS | Status: AC
  Administered 2019-05-27: 10 mL
  Filled 2019-05-27: qty 10

## 2019-05-27 NOTE — Transfer of Care (Signed)
Immediate Anesthesia Transfer of Care Note  Patient: Crystal Vargas  Procedure(s) Performed: TRANSESOPHAGEAL ECHOCARDIOGRAM (TEE) (N/A )  Patient Location: PACU  Anesthesia Type:General  Level of Consciousness: sedated  Airway & Oxygen Therapy: Patient Spontanous Breathing and Patient connected to nasal cannula oxygen  Post-op Assessment: Report given to RN and Post -op Vital signs reviewed and stable  Post vital signs: Reviewed and stable  Last Vitals:  Vitals Value Taken Time  BP 176/66 05/17/2019 0805  Temp    Pulse 67 05/23/2019 0813  Resp 32 05/13/2019 0813  SpO2 100 % 05/29/2019 0813  Vitals shown include unvalidated device data.  Last Pain:  Vitals:   06/05/2019 0752  TempSrc:   PainSc: 0-No pain      Patients Stated Pain Goal: 0 (83/35/82 5189)  Complications: No apparent anesthesia complications

## 2019-05-27 NOTE — Progress Notes (Signed)
[facility]  ROUNDING NOTE   Subjective:  Patient completed dialysis treatment yesterday. Overall remains quite lethargic and weak.   Objective:  Vital signs in last 24 hours:  Temp:  [97.5 F (36.4 C)-98.7 F (37.1 C)] 98.1 F (36.7 C) (04/20 0905) Pulse Rate:  [56-72] 59 (04/20 0905) Resp:  [19-25] 20 (04/20 0905) BP: (127-177)/(60-78) 163/65 (04/20 0905) SpO2:  [100 %] 100 % (04/20 0905) Weight:  [70.4 kg] 70.4 kg (04/20 0500)  Weight change: -4.2 kg Filed Weights   05/24/19 0628 05/08/2019 0630 06/04/2019 0500  Weight: 64.7 kg 74.6 kg 70.4 kg    Intake/Output: I/O last 3 completed shifts: In: 0  Out: 500 [Other:500]   Intake/Output this shift:  No intake/output data recorded.  Physical Exam: General: Ill appearing  Head: Normocephalic, atraumatic.   Eyes: Anicteric  Neck: Supple  Lungs:  Clear bilateral, normal effort  Heart: Regular rhythm  Abdomen:  Soft, nontender   Extremities: No peripheral edema. Feet in soft supports  Neurologic: Lethargic, but arousable  Skin: No lesions  Access: Left IJ PermCath    Basic Metabolic Panel: Recent Labs  Lab 05/10/2019 0640 06/02/2019 1540 05/18/2019 0032 05/24/2019 1558  NA 145  --  142  --   K 3.6 3.6 3.6  --   CL 111  --  106  --   CO2 25  --  23  --   GLUCOSE 135*  --  140*  --   BUN 35*  --  19  --   CREATININE 2.92*  --  1.85*  --   CALCIUM 8.2*  --  8.3*  --   PHOS  --   --   --  2.2*    Liver Function Tests: Recent Labs  Lab 05/14/2019 0640  AST 18  ALT 16  ALKPHOS 82  BILITOT 1.6*  PROT 5.5*  ALBUMIN 2.0*   No results for input(s): LIPASE, AMYLASE in the last 168 hours. No results for input(s): AMMONIA in the last 168 hours.  CBC: Recent Labs  Lab 05/08/2019 0640  WBC 9.2  HGB 10.1*  HCT 30.0*  MCV 90.4  PLT 137*    Cardiac Enzymes: Recent Labs  Lab 05/25/2019 0640  CKTOTAL 16*    BNP: Invalid input(s): POCBNP  CBG: Recent Labs  Lab 05/25/19 0743 05/25/19 0918  05/25/19 1130 05/25/19 1645 05/25/19 2113  GLUCAP 143* 149* 155* 112* 178*    Microbiology: Results for orders placed or performed during the hospital encounter of 05/09/2019  Blood culture (routine x 2)     Status: Abnormal   Collection Time: 05/25/2019 12:34 PM   Specimen: BLOOD  Result Value Ref Range Status   Specimen Description   Final    BLOOD R UP ARM Performed at [facility], [address], [place], [place] [address]    Special Requests   Final    BOTTLES DRAWN AEROBIC AND ANAEROBIC Blood Culture adequate volume Performed at [facility], [address] [place], [place] [address]    Culture  Setup Time   Final    Organism ID to follow IN [place] TO, READ BACK BY AND VERIFIED WITH: Benjamin ON 05/11/19 AT 0210 [facility] Performed at [facility], [address] [place], [place] [address]    Culture (A)  Final    STAPHYLOCOCCUS AUREUS SUSCEPTIBILITIES PERFORMED ON PREVIOUS CULTURE WITHIN THE LAST 5 DAYS. Performed at [facility], [address] [place], [place] [address]    Report Status 05/13/2019 FINAL  Final  Blood Culture ID Panel (Reflexed)     Status: Abnormal   Collection Time: 05/09/2019 12:34 PM  Result Value Ref Range Status   Enterococcus species NOT DETECTED NOT DETECTED Final   Listeria monocytogenes NOT DETECTED NOT DETECTED Final   Staphylococcus species DETECTED (A) NOT DETECTED Final    Comment: CRITICAL RESULT CALLED TO, READ BACK BY AND VERIFIED WITH: SCOTT HALL ON 05/11/19 AT 0210 [facility]    Staphylococcus aureus (BCID) DETECTED (A) NOT DETECTED Final    Comment: Methicillin (oxacillin)-resistant Staphylococcus aureus (MRSA). MRSA is predictably resistant to beta-lactam antibiotics (except ceftaroline). Preferred therapy is vancomycin unless clinically contraindicated. Patient requires contact precautions if  hospitalized. CRITICAL RESULT CALLED TO,  READ BACK BY AND VERIFIED WITH: SCOTT HALL ON 05/11/19 AT 0210 [facility]    Methicillin resistance DETECTED (A) NOT DETECTED Final    Comment: CRITICAL RESULT CALLED TO, READ BACK BY AND VERIFIED WITH: SCOTT HALL ON 05/11/19 AT 0210 [place]    Streptococcus species NOT DETECTED NOT DETECTED Final   Streptococcus agalactiae NOT DETECTED NOT DETECTED Final   Streptococcus pneumoniae NOT DETECTED NOT DETECTED Final   Streptococcus pyogenes NOT DETECTED NOT DETECTED Final   Acinetobacter baumannii NOT DETECTED NOT DETECTED Final   Enterobacteriaceae species NOT DETECTED NOT DETECTED Final   Enterobacter cloacae complex NOT DETECTED NOT DETECTED Final   Escherichia coli NOT DETECTED NOT DETECTED Final   Klebsiella oxytoca NOT DETECTED NOT DETECTED Final   Klebsiella pneumoniae NOT DETECTED NOT DETECTED Final   Proteus species NOT DETECTED NOT DETECTED Final   Serratia marcescens NOT DETECTED NOT DETECTED Final   Haemophilus influenzae NOT DETECTED NOT DETECTED Final   Neisseria meningitidis NOT DETECTED NOT DETECTED Final   Pseudomonas aeruginosa NOT DETECTED NOT DETECTED Final   Candida albicans NOT DETECTED NOT DETECTED Final   Candida glabrata NOT DETECTED NOT DETECTED Final   Candida krusei NOT DETECTED NOT DETECTED Final   Candida parapsilosis NOT DETECTED NOT DETECTED Final   Candida tropicalis NOT DETECTED NOT DETECTED Final    Comment: Performed at [facility], [address] [place], [place] [address]  Blood culture (routine x 2)     Status: Abnormal   Collection Time: 05/19/2019  1:24 PM   Specimen: BLOOD  Result Value Ref Range Status   Specimen Description   Final    BLOOD R LATREAL BICEP Performed at [facility], [address] [place], [place] [address]    Special Requests   Final    BOTTLES DRAWN AEROBIC AND ANAEROBIC Blood Culture adequate volume Performed at [facility], [address] [place], [place] [address]    Culture  Setup Time   Final     IN BOTH AEROBIC AND ANAEROBIC BOTTLES GRAM POSITIVE COCCI CRITICAL VALUE NOTED.  VALUE IS CONSISTENT WITH PREVIOUSLY REPORTED AND CALLED VALUE. Performed at [facility], [address] [place], [place] [address]    Culture METHICILLIN RESISTANT STAPHYLOCOCCUS AUREUS (A)  Final   Report Status 05/13/2019 FINAL  Final   Organism ID, Bacteria METHICILLIN RESISTANT STAPHYLOCOCCUS AUREUS  Final      Susceptibility   Methicillin resistant staphylococcus aureus - MIC*    CIPROFLOXACIN >=8 RESISTANT Resistant     ERYTHROMYCIN >=8 RESISTANT Resistant     GENTAMICIN <=0.5 SENSITIVE Sensitive     OXACILLIN >=4 RESISTANT Resistant     TETRACYCLINE <=1 SENSITIVE Sensitive  VANCOMYCIN <=0.5 SENSITIVE Sensitive     TRIMETH/SULFA >=320 RESISTANT Resistant     CLINDAMYCIN <=0.25 SENSITIVE Sensitive     RIFAMPIN <=0.5 SENSITIVE Sensitive     Inducible Clindamycin NEGATIVE Sensitive     * METHICILLIN RESISTANT STAPHYLOCOCCUS AUREUS  Respiratory Panel by RT PCR (Flu A&B, Covid) - Nasopharyngeal Swab     Status: None   Collection Time: 05/09/2019  1:24 PM   Specimen: Nasopharyngeal Swab  Result Value Ref Range Status   SARS Coronavirus 2 by RT PCR NEGATIVE NEGATIVE Final    Comment: (NOTE) SARS-CoV-2 target nucleic acids are NOT DETECTED. The SARS-CoV-2 RNA is generally detectable in upper respiratoy specimens during the acute phase of infection. The lowest concentration of SARS-CoV-2 viral copies this assay can detect is 131 copies/mL. A negative result does not preclude SARS-Cov-2 infection and should not be used as the sole basis for treatment or other patient management decisions. A negative result may occur with  improper specimen collection/handling, submission of specimen other than nasopharyngeal swab, presence of viral mutation(s) within the areas targeted by this assay, and inadequate number of viral copies (<131 copies/mL). A negative result must be combined with  clinical observations, patient history, and epidemiological information. The expected result is Negative. Fact Sheet for Patients:  PinkCheek.be Fact Sheet for Healthcare Providers:  GravelBags.it This test is not yet ap proved or cleared by the [place] FDA and  has been authorized for detection and/or diagnosis of SARS-CoV-2 by FDA under an Emergency Use Authorization (EUA). This EUA will remain  in effect (meaning this test can be used) for the duration of the COVID-19 declaration under Section 564(b)(1) of the Act, 21 U.S.C. section 360bbb-3(b)(1), unless the authorization is terminated or revoked sooner.    Influenza A by PCR NEGATIVE NEGATIVE Final   Influenza B by PCR NEGATIVE NEGATIVE Final    Comment: (NOTE) The Xpert Xpress SARS-CoV-2/FLU/RSV assay is intended as an aid in  the diagnosis of influenza from Nasopharyngeal swab specimens and  should not be used as a sole basis for treatment. Nasal washings and  aspirates are unacceptable for Xpert Xpress SARS-CoV-2/FLU/RSV  testing. Fact Sheet for Patients: PinkCheek.be Fact Sheet for Healthcare Providers: GravelBags.it This test is not yet approved or cleared by the [place] FDA and  has been authorized for detection and/or diagnosis of SARS-CoV-2 by  FDA under an Emergency Use Authorization (EUA). This EUA will remain  in effect (meaning this test can be used) for the duration of the  Covid-19 declaration under Section 564(b)(1) of the Act, 21  U.S.C. section 360bbb-3(b)(1), unless the authorization is  terminated or revoked. Performed at [facility], [address] [place], [place] [address]   MRSA PCR Screening     Status: None   Collection Time: 05/23/2019  6:27 PM   Specimen: Nasopharyngeal  Result Value Ref Range Status   MRSA by PCR NEGATIVE NEGATIVE Final    Comment:         The GeneXpert MRSA Assay (FDA approved for NASAL specimens only), is one component of a comprehensive MRSA colonization surveillance program. It is not intended to diagnose MRSA infection nor to guide or monitor treatment for MRSA infections. Performed at [facility] [address], [place], [place] [address]   Urine culture     Status: Abnormal   Collection Time: 05/10/2019 10:30 PM   Specimen: Urine, Clean Catch  Result Value Ref Range Status   Specimen Description   Final  URINE, CLEAN CATCH Performed at [facility], [address] [place], [place] [address]    Special Requests   Final    NONE Performed at [facility], [address] [place], [place] [address]    Culture (A)  Final    >=100,000 COLONIES/mL KLEBSIELLA PNEUMONIAE >=100,000 COLONIES/mL VANCOMYCIN RESISTANT ENTEROCOCCUS    Report Status 05/14/2019 FINAL  Final   Organism ID, Bacteria KLEBSIELLA PNEUMONIAE (A)  Final   Organism ID, Bacteria VANCOMYCIN RESISTANT ENTEROCOCCUS (A)  Final      Susceptibility   Klebsiella pneumoniae - MIC*    AMPICILLIN >=32 RESISTANT Resistant     CEFAZOLIN <=4 SENSITIVE Sensitive     CEFTRIAXONE <=0.25 SENSITIVE Sensitive     CIPROFLOXACIN <=0.25 SENSITIVE Sensitive     GENTAMICIN <=1 SENSITIVE Sensitive     IMIPENEM 0.5 SENSITIVE Sensitive     NITROFURANTOIN 64 INTERMEDIATE Intermediate     TRIMETH/SULFA <=20 SENSITIVE Sensitive     AMPICILLIN/SULBACTAM 4 SENSITIVE Sensitive     PIP/TAZO <=4 SENSITIVE Sensitive     * >=100,000 COLONIES/mL KLEBSIELLA PNEUMONIAE   Vancomycin resistant enterococcus - MIC*    AMPICILLIN >=32 RESISTANT Resistant     NITROFURANTOIN 256 RESISTANT Resistant     VANCOMYCIN >=32 RESISTANT Resistant     LINEZOLID 2 SENSITIVE Sensitive     * >=100,000 COLONIES/mL VANCOMYCIN RESISTANT ENTEROCOCCUS  Cath Tip Culture     Status: Abnormal   Collection Time: 05/11/19  1:10 PM   Specimen: Catheter Tip; Other  Result  Value Ref Range Status   Specimen Description   Final    CATH TIP Performed at [facility] [address] [place], [place] [address]    Special Requests   Final    NONE Performed at [facility], [address] [place], [place] [address]    Culture (A)  Final    >=100,000 COLONIES/mL METHICILLIN RESISTANT STAPHYLOCOCCUS AUREUS   Report Status 05/13/2019 FINAL  Final   Organism ID, Bacteria METHICILLIN RESISTANT STAPHYLOCOCCUS AUREUS (A)  Final      Susceptibility   Methicillin resistant staphylococcus aureus - MIC*    CIPROFLOXACIN >=8 RESISTANT Resistant     ERYTHROMYCIN >=8 RESISTANT Resistant     GENTAMICIN <=0.5 SENSITIVE Sensitive     OXACILLIN >=4 RESISTANT Resistant     TETRACYCLINE <=1 SENSITIVE Sensitive     VANCOMYCIN 1 SENSITIVE Sensitive     TRIMETH/SULFA >=320 RESISTANT Resistant     CLINDAMYCIN <=0.25 SENSITIVE Sensitive     RIFAMPIN <=0.5 SENSITIVE Sensitive     Inducible Clindamycin NEGATIVE Sensitive     * >=100,000 COLONIES/mL METHICILLIN RESISTANT STAPHYLOCOCCUS AUREUS  Culture, blood (routine x 2)     Status: Abnormal   Collection Time: 05/11/19  4:27 PM   Specimen: BLOOD  Result Value Ref Range Status   Specimen Description   Final    BLOOD A-LINE Performed at [facility], [address] [place], [place] [address]    Special Requests   Final    BOTTLES DRAWN AEROBIC AND ANAEROBIC Blood Culture adequate volume Performed at [facility], [address] [place], [place] [address]    Culture  Setup Time   Final    GRAM POSITIVE COCCI ANAEROBIC BOTTLE ONLY CRITICAL VALUE NOTED.  VALUE IS CONSISTENT WITH PREVIOUSLY REPORTED AND CALLED VALUE. Performed at [facility], [address], [place], [place] [address]    Culture (A)  Final    STAPHYLOCOCCUS AUREUS SUSCEPTIBILITIES PERFORMED ON  PREVIOUS CULTURE WITHIN THE LAST 5 DAYS. Performed at [facility], [place] [address] [place], [place]  [address]    Report Status 05/12/2019 FINAL  Final  Culture, blood (routine x 2)     Status: Abnormal   Collection Time: 05/12/19  6:06 AM   Specimen: BLOOD  Result Value Ref Range Status   Specimen Description   Final    BLOOD BLOOD RIGHT HAND Performed at [facility], [address] [place], [place] [address]    Special Requests   Final    BOTTLES DRAWN AEROBIC AND ANAEROBIC Blood Culture adequate volume Performed at [facility], [address] [place], [place] [address]    Culture  Setup Time   Final    GRAM POSITIVE COCCI ANAEROBIC BOTTLE ONLY CRITICAL RESULT CALLED TO, READ BACK BY AND VERIFIED WITH: [place] ON 05/12/2019 BY MOSLEY,J Performed at [facility], [address] [place], [place] [address]    Culture METHICILLIN RESISTANT STAPHYLOCOCCUS AUREUS (A)  Final   Report Status 05/12/2019 FINAL  Final   Organism ID, Bacteria METHICILLIN RESISTANT STAPHYLOCOCCUS AUREUS  Final      Susceptibility   Methicillin resistant staphylococcus aureus - MIC*    CIPROFLOXACIN >=8 RESISTANT Resistant     ERYTHROMYCIN >=8 RESISTANT Resistant     GENTAMICIN <=0.5 SENSITIVE Sensitive     OXACILLIN >=4 RESISTANT Resistant     TETRACYCLINE <=1 SENSITIVE Sensitive     VANCOMYCIN 1 SENSITIVE Sensitive     TRIMETH/SULFA >=320 RESISTANT Resistant     CLINDAMYCIN <=0.25 SENSITIVE Sensitive     RIFAMPIN <=0.5 SENSITIVE Sensitive     Inducible Clindamycin NEGATIVE Sensitive     * METHICILLIN RESISTANT STAPHYLOCOCCUS AUREUS  CULTURE, BLOOD (ROUTINE X 2) w Reflex to ID Panel     Status: Abnormal   Collection Time: 05/13/19  2:20 PM   Specimen: BLOOD  Result Value Ref Range Status   Specimen Description   Final    BLOOD BLOOD RIGHT HAND Performed at [facility], [address], [place], [place] [address]    Special Requests   Final    BOTTLES DRAWN AEROBIC AND ANAEROBIC Blood Culture adequate volume Performed at [facility],  [address] [place], [place] [address]    Culture  Setup Time   Final    GRAM POSITIVE COCCI ANAEROBIC BOTTLE ONLY CRITICAL RESULT CALLED TO, READ BACK BY AND VERIFIED WITH: [facility] KATSOUDAS AT 4008 05/14/19 SDR Performed at [facility], [address] [place], [place] [address]    Culture (A)  Final    STAPHYLOCOCCUS AUREUS SUSCEPTIBILITIES PERFORMED ON PREVIOUS CULTURE WITHIN THE LAST 5 DAYS. Performed at [facility], [place] [address], [place], [place] [address]    Report Status 05/16/2019 FINAL  Final  CULTURE, BLOOD (ROUTINE X 2) w Reflex to ID Panel     Status: None   Collection Time: 05/13/19  3:39 PM   Specimen: BLOOD  Result Value Ref Range Status   Specimen Description BLOOD BLOOD LEFT HAND  Final   Special Requests   Final    BOTTLES DRAWN AEROBIC ONLY Blood Culture adequate volume   Culture   Final    NO GROWTH 5 DAYS Performed at [facility], [address] [place], [place] [address]    Report Status 05/18/2019 FINAL  Final  CULTURE, BLOOD (ROUTINE X 2) w Reflex to ID Panel     Status: Abnormal  Collection Time: 05/14/19  3:33 PM   Specimen: BLOOD  Result Value Ref Range Status   Specimen Description   Final    BLOOD BLOOD RIGHT HAND Performed at [facility], [address] [place], [place] [address]    Special Requests   Final    BOTTLES DRAWN AEROBIC AND ANAEROBIC Blood Culture adequate volume Performed at [facility], [address] [place], [place] [address]    Culture  Setup Time   Final    GRAM POSITIVE COCCI IN CLUSTERS ANAEROBIC BOTTLE ONLY CRITICAL VALUE NOTED.  VALUE IS CONSISTENT WITH PREVIOUSLY REPORTED AND CALLED VALUE.    Culture (A)  Final    STAPHYLOCOCCUS AUREUS SEE SEPARATE REPORT IN [facility] FOR DOS 05/14/19. Performed at [facility], [place] [address], [place], [place] [address]    Report Status 05/24/2019 FINAL  Final  Cath Tip Culture     Status: None   Collection Time:  05/14/19  4:24 PM   Specimen: Catheter Tip; Other  Result Value Ref Range Status   Specimen Description   Final    CATH TIP Performed at [facility], [address] [place], [place] [address]    Special Requests   Final    NONE Performed at [facility], [address], [place], [place] [address]    Culture   Final    NO GROWTH 3 DAYS Performed at [facility], [place] [address] [place], [place] [address]    Report Status 05/17/2019 FINAL  Final  CULTURE, BLOOD (ROUTINE X 2) w Reflex to ID Panel     Status: Abnormal   Collection Time: 05/14/19  6:25 PM   Specimen: BLOOD  Result Value Ref Range Status   Specimen Description   Final    BLOOD BLOOD LEFT HAND Performed at [facility], [address] [place], [place] [address]    Special Requests   Final    BOTTLES DRAWN AEROBIC AND ANAEROBIC Blood Culture results may not be optimal due to an inadequate volume of blood received in culture bottles Performed at [facility], [address], [place], [place] [address]    Culture  Setup Time   Final    GRAM POSITIVE COCCI ANAEROBIC BOTTLE ONLY CRITICAL VALUE NOTED.  VALUE IS CONSISTENT WITH PREVIOUSLY REPORTED AND CALLED VALUE. [place] Performed at [facility], [address] [place], [place] [address]    Culture (A)  Final    STAPHYLOCOCCUS AUREUS SUSCEPTIBILITIES PERFORMED ON PREVIOUS CULTURE WITHIN THE LAST 5 DAYS. Performed at [facility], [place] [address], [place], [place] [address]    Report Status 05/17/2019 FINAL  Final  Culture, blood (Routine X 2) w Reflex to ID Panel     Status: None   Collection Time: 05/16/19 10:45 AM   Specimen: BLOOD  Result Value Ref Range Status   Specimen Description BLOOD RIGHT HAND  Final   Special Requests BLOOD Blood Culture adequate volume  Final   Culture   Final    NO GROWTH 5 DAYS Performed at [facility], [address], [place], [place] [address]    Report  Status 06/04/2019 FINAL  Final  Culture, blood (Routine X 2) w Reflex to ID Panel     Status: None   Collection Time: 05/16/19 12:38 PM   Specimen: BLOOD  Result Value Ref Range Status   Specimen Description BLOOD RT HAND  Final   Special Requests   Final  BOTTLES DRAWN AEROBIC AND ANAEROBIC Blood Culture adequate volume   Culture   Final    NO GROWTH 5 DAYS Performed at [facility], [address] [place], [place] [address]    Report Status 05/14/2019 FINAL  Final    Coagulation Studies: No results for input(s): LABPROT, INR in the last 72 hours.  Urinalysis: No results for input(s): COLORURINE, LABSPEC, PHURINE, GLUCOSEU, HGBUR, BILIRUBINUR, KETONESUR, PROTEINUR, UROBILINOGEN, NITRITE, LEUKOCYTESUR in the last 72 hours.  Invalid input(s): APPERANCEUR    Imaging: No results found.   Medications:   . sodium chloride 20 mL (06/04/2019 1812)  . sodium chloride    . ceFTAROline (TEFLARO) IV 200 mg (05/14/2019 1148)  . DAPTOmycin (CUBICIN)  IV 500 mg (05/12/2019 2354)   . allopurinol  100 mg Oral QPM  . amLODipine  2.5 mg Oral Daily  . vitamin C  500 mg Oral BID  . atorvastatin  10 mg Oral q1800  . brimonidine  1 drop Both Eyes Q12H   And  . timolol  1 drop Both Eyes Q12H  . carvedilol  25 mg Oral BID WC  . Chlorhexidine Gluconate Cloth  6 each Topical Daily  . epoetin (EPOGEN/PROCRIT) injection  4,000 Units Intravenous Q M,W,F-HD  . feeding supplement (NEPRO CARB STEADY)  237 mL Oral QID  . latanoprost  1 drop Both Eyes QPM  . levETIRAcetam  1,000 mg Oral QPM  . megestrol  400 mg Oral BID  . midodrine  5 mg Oral Q M,W,F  . mirtazapine  7.5 mg Oral QHS  . multivitamin  1 tablet Oral QHS  . pantoprazole  40 mg Oral QHS  . sertraline  50 mg Oral Daily     so far Assessment/ Plan:  Crystal Vargas is a 74 y.o. black female with end stage renal disease on hemodialysis, CVA with left hemiparesis, hypertension, hyperlipidemia, GERD, glaucoma, diabetes mellitus  type II, coronary artery disease, gout, seizure disorder who was admitted to [facility] on 06/02/2019 for Hypokalemia [E87.6] Shock ([place]) [R57.9] ESRD (end stage renal disease) ([place]) [N18.6] Hypotension, unspecified hypotension type [I95.9] Anemia, unspecified type [D64.9]  [facility] MWF  63.5kg  # End Stage Renal Disease:  Patient had dialysis treatment yesterday.  No acute indication for dialysis today.  We will schedule dialysis treatment for tomorrow..  # Hypotension: with MRSA bacteremia/sepsis Persistent positive blood cultures from 4/7.   Neg results from 4/9 so far -Patient treated with Teflaro and daptomycin.  Daptomycin has been approved to administer as an outpatient.    #. Anemia with chronic kidney disease:  Lab Results  Component Value Date   HGB 10.1 (L) 06/02/2019  Continue Epogen 4000 IV with dialysis treatments.  # Secondary Hyperparathyroidism:  Off binders at present Lab Results  Component Value Date   PTH 232 (H) 05/11/2019   CALCIUM 8.3 (L) 05/30/2019   PHOS 2.2 (L) 05/08/2019  Serum phosphorus 2.2 at last check.  Continue to monitor.  #Malnutrition:   Currently on pureed diet albumin 2.0 on 05/18/2019 Eating less than 50% of her meals.  Overall prognosis remains quite guarded given underlying malnutrition.     LOS: 17 Blane [place] 4/20/202112:21 PM

## 2019-05-27 NOTE — Anesthesia Procedure Notes (Signed)
Date/Time: 05/16/2019 8:14 AM Performed by: Nelda Marseille, CRNA Pre-anesthesia Checklist: Patient identified, Emergency Drugs available, Suction available, Patient being monitored and Timeout performed Oxygen Delivery Method: Nasal cannula

## 2019-05-27 NOTE — Progress Notes (Signed)
[facility] Daily Progress Note  Subjective: 05/09/2019: 1. Ultrasound guidance for vascular access to the left internal jugular vein 2. Fluoroscopic guidance for placement of catheter 3. Placement of a 23 cm tip to cuff tunneled hemodialysis catheter via the left internal jugular vein  Patient with MRSA bacteremia will need 6 weeks of daptomycin and Teflaro.  Does not have a adequate access for long-term antibiotics.  Vascular surgery was reconsulted by Dr. Delaine Lame for placement of a Hickman catheter.  Objective: Vitals:   06/02/2019 0816 05/29/2019 0845 05/16/2019 0905 05/16/2019 1248  BP: (!) 158/65 (!) 159/78 (!) 163/65 133/70  Pulse: 66 65 (!) 59 (!) 59  Resp: (!) 24 (!) 25 20 20   Temp:   98.1 F (36.7 C) 98.5 F (36.9 C)  TempSrc:   Oral Oral  SpO2: 100% 100% 100% 97%  Weight:      Height:       No intake or output data in the 24 hours ending 05/08/2019 1746  Physical Exam: A&Ox1, NAD CV: RRR Pulmonary: CTA Bilaterally Abdomen: Soft, Nontender, Nondistended Vascular:  PermCath: Intact to left-sided chest.  Dressing clean dry intact.  No swelling or drainage noted.   Laboratory: CBC    Component Value Date/Time   WBC 9.2 05/31/2019 0640   HGB 10.1 (L) 05/13/2019 0640   HCT 30.0 (L) 06/05/2019 0640   PLT 137 (L) 05/16/2019 0640   BMET    Component Value Date/Time   NA 142 05/11/2019 0032   K 3.6 05/13/2019 0032   CL 106 05/31/2019 0032   CO2 23 05/14/2019 0032   GLUCOSE 140 (H) 05/08/2019 0032   BUN 19 05/24/2019 0032   CREATININE 1.85 (H) 05/14/2019 0032   CALCIUM 8.3 (L) 05/17/2019 0032   GFRNONAA 26 (L) 05/09/2019 0032   GFRAA 30 (L) 05/20/2019 0032   Assessment/Planning: The patient is a 74 year old female with multiple medical issues including end-stage renal disease currently maintained by left PermCath and bacteremia requiring 6 weeks of IV antibiotics  1) long-term antibiotics: Patient with bacteremia that will require 6 weeks of  daptomycin and Teflaro.  At this time, the patient does not have an adequate IV access.  Due to her end-stage renal disease she is not a candidate for PICC.  Recommend insertion of a Hickman catheter.  We will plan on this tomorrow with Dr. Fulton Mole.  2) End-stage renal disease: Patient currently has a left IJ PermCath which is functioning well.  Discussed with Dr. Eber Hong Felicia Bloomquist PA-C 05/22/2019 5:46 PM

## 2019-05-27 NOTE — Progress Notes (Signed)
Nutrition Follow Up Note   DOCUMENTATION CODES:   Not applicable  INTERVENTION:   Nepro Shake po QID, each supplement provides 425 kcal and 19 grams protein  Magic cup TID with meals, each supplement provides 290 kcal and 9 grams of protein  Rena-vite daily   Vitamin C 546m po BID  Pt at high refeed risk  NUTRITION DIAGNOSIS:   Inadequate oral intake related to poor appetite, dysphagia as evidenced by meal completion < 50%.  GOAL:   Patient will meet greater than or equal to 90% of their needs  -not met   MONITOR:   PO intake, Supplement acceptance, Weight trends, Labs, I & O's, Diet advancement, Skin  ASSESSMENT:   Patient with PMH significant for ESRD on HD, dysphagia due to Schatzki's ring with past dilations, CAD, DM, GERD, HLD, and HTN. Presents this admission with MRSA bacteremia.   Pt s/p placement of a 23 cm tip to cuff tunneled hemodialysis catheter 4/12  EGD performed 4/14 revealed esophageal stenosis that was dilated  Pt continues to have poor appetite and oral intake; pt eating <25% of meals but is drinking some Nepro. A 48 hour calorie count was completed on 4/19 and found that pt is meeting <50% of her estimated needs via oral intake. Pt is on megace and remeron. Per MD note, pt does not want a feeding tube nor is she willing to discuss hospice or palliative care. Pt is currently NPO today for TEE. Recommend continue supplements and vitamins. Pt to discharge to SNF when medically ready.   Per chart, pt up ~11lbs since admit.   Medications reviewed and include: allopurinol, vitamin C, epoetin, megace, remeron, rena-vite, protonix, NaCl @20ml /hr, ceftaroline, daptomycin   Labs reviewed: K 3.6 wnl, creat 1.85(H), P 2.2(L) Hgb 10.1(L), Hct 30.0(L)  Diet Order:   Diet Order            DIET DYS 2 Room service appropriate? Yes; Fluid consistency: Thin  Diet effective now             EDUCATION NEEDS:   Not appropriate for education at this  time  Skin:  Skin Assessment: Reviewed RN Assessment(L heel 3.5cm x 2.5cm x 0cm) Skin Integrity Issues:: Unstageable, Other (Comment) Unstageable: L heel Other: L toe  Last BM:  4/20- type 6  Height:   Ht Readings from Last 1 Encounters:  05/23/2019 5' 2"  (1.575 m)    Weight:   Wt Readings from Last 1 Encounters:  05/10/2019 70.4 kg    BMI:  Body mass index is 28.39 kg/m.  Estimated Nutritional Needs:   Kcal:  1600-1800kcal/day  Protein:  80-90g/day  Fluid:  UOP +1L  CKoleen DistanceMS, RD, LDN Please refer to [facility] RD and/or RD on-call/weekend/after hours pager

## 2019-05-27 NOTE — Consult Note (Signed)
PHARMACY CONSULT NOTE FOR:  OUTPATIENT  PARENTERAL ANTIBIOTIC THERAPY (OPAT)  Indication: bacteremia Regimen: daptomycin and ceftaroline End date: 06/20/19   IV antibiotic discharge orders are pended. To discharging provider:  please sign these orders via discharge navigator,  Select New Orders & click on the button choice - Manage This Unsigned Work.     Thank you for allowing pharmacy to be a part of this patient's care.  Dallie Piles 05/11/2019, 1:50 PM

## 2019-05-27 NOTE — Procedures (Signed)
   TRANSESOPHAGEAL ECHOCARDIOGRAM   NAME:  Yamel Bale   MRN: [id number] DOB:  1945/07/11   ADMIT DATE: 05/16/2019  INDICATIONS:   PROCEDURE:   Informed consent was obtained prior to the procedure. The risks, benefits and alternatives for the procedure were discussed and the patient comprehended these risks.  Risks include, but are not limited to, cough, sore throat, vomiting, nausea, somnolence, esophageal and stomach trauma or perforation, bleeding, low blood pressure, aspiration, pneumonia, infection, trauma to the teeth and death.    Sedation per dept of anesthesia  The oropharynx was anesthetized 3 cc of topical 1% viscous lidocaine.  The transesophageal probe was inserted in the esophagus and stomach without difficulty and multiple views were obtained. I was present for the entire procedure.    COMPLICATIONS:    There were no immediate complications.  FINDINGS:  LEFT VENTRICLE: EF = 55%. No regional wall motion abnormalities.  RIGHT VENTRICLE: Normal size and function.   LEFT ATRIUM: Normal  LEFT ATRIAL APPENDAGE: No thrombus.   RIGHT ATRIUM: Normal  AORTIC VALVE:  Trileaflet. No vegetations seen. Trivial ai  MITRAL VALVE:    Normal. No vegetations seen/ Trivial to mild mr  TRICUSPID VALVE: Normal. No vegetations. Mild tr  PULMONIC VALVE: Grossly normal.  INTERATRIAL SEPTUM: Intact   PERICARDIUM: No effusion  DESCENDING AORTA: Normal  CONCLUSION: No vegetations seen.

## 2019-05-27 NOTE — Care Management Important Message (Signed)
Important Message  Patient Details  Name: Crystal Vargas MRN: [id number] Date of Birth: [birth year]   Medicare Important Message Given:  Yes  Reviewed verbally over phone with daughter, Aubriella Perezgarcia.  Requested copy of Medicare IM be sent to her via email.  Copy sent securely to email address provided: [email or web address].     Dannette Barbara 05/12/2019, 2:03 PM

## 2019-05-27 NOTE — Anesthesia Preprocedure Evaluation (Signed)
Anesthesia Evaluation  Patient identified by MRN, date of birth, ID band Patient awake    Reviewed: Allergy & Precautions, H&P , NPO status , Patient's Chart, lab work & pertinent test results, reviewed documented beta blocker date and time   Airway Mallampati: II   Neck ROM: full    Dental  (+) Poor Dentition   Pulmonary neg pulmonary ROS,    Pulmonary exam normal        Cardiovascular Exercise Tolerance: Poor hypertension, On Medications + CAD  negative cardio ROS Normal cardiovascular exam Rhythm:regular Rate:Normal     Neuro/Psych PSYCHIATRIC DISORDERS Depression CVA, Residual Symptoms negative neurological ROS  negative psych ROS   GI/Hepatic negative GI ROS, Neg liver ROS, GERD  Medicated,  Endo/Other  negative endocrine ROSdiabetes  Renal/GU Renal diseasenegative Renal ROS  negative genitourinary   Musculoskeletal   Abdominal   Peds  Hematology negative hematology ROS (+)   Anesthesia Other Findings Past Medical History: No date: Chronic kidney disease     Comment:  RENAL INSUFFICIENCY No date: Coronary artery disease No date: Depression No date: Diabetes mellitus without complication (HCC) No date: GERD (gastroesophageal reflux disease) No date: Glaucoma No date: Hyperlipidemia No date: Hypertension No date: Stroke [facility]) Past Surgical History: No date: ABDOMINAL HYSTERECTOMY 06/04/2019: DIALYSIS/PERMA CATHETER INSERTION; N/A     Comment:  Procedure: DIALYSIS/PERMA CATHETER INSERTION;  Surgeon:               Algernon Huxley, MD;  Location: [place] [facility];                Service: Cardiovascular;  Laterality: N/A; 05/30/2019: ESOPHAGOGASTRODUODENOSCOPY; N/A     Comment:  Procedure: ESOPHAGOGASTRODUODENOSCOPY (EGD);  Surgeon:               Lin Landsman, MD;  Location: [facility] ENDOSCOPY;                Service: Gastroenterology;  Laterality: N/A; 01/21/2018: ESOPHAGOGASTRODUODENOSCOPY (EGD) WITH  PROPOFOL; N/A     Comment:  Procedure: ESOPHAGOGASTRODUODENOSCOPY (EGD) WITH               PROPOFOL;  Surgeon: Manya Silvas, MD;  Location:               [facility] ENDOSCOPY;  Service: Endoscopy;  Laterality: N/A; 05/18/2019: TEE WITHOUT CARDIOVERSION; N/A     Comment:  Procedure: TRANSESOPHAGEAL ECHOCARDIOGRAM (TEE);                Surgeon: Teodoro Spray, MD;  Location: [facility] ORS;                Service: Cardiovascular;  Laterality: N/A; 05/22/2019: TEMPORARY DIALYSIS CATHETER; N/A     Comment:  Procedure: TEMPORARY DIALYSIS CATHETER;  Surgeon: Algernon Huxley, MD;  Location: [place] [facility];  Service:               Cardiovascular;  Laterality: N/A; BMI    Body Mass Index: 28.39 kg/m     Reproductive/Obstetrics negative OB ROS                             Anesthesia Physical Anesthesia Plan  ASA: III  Anesthesia Plan: General   Post-op Pain Management:    Induction:   PONV Risk Score and Plan:   Airway Management Planned:   Additional Equipment:  Intra-op Plan:   Post-operative Plan:   Informed Consent: I have reviewed the patients History and Physical, chart, labs and discussed the procedure including the risks, benefits and alternatives for the proposed anesthesia with the patient or authorized representative who has indicated his/her understanding and acceptance.     Dental Advisory Given  Plan Discussed with: CRNA  Anesthesia Plan Comments:         Anesthesia Quick Evaluation

## 2019-05-27 NOTE — Progress Notes (Signed)
PROGRESS NOTE    Crystal Vargas   CHY:[id number]  DOB: 05-25-1945  DOA: 05/11/2019 PCP: Marisa Hua, MD   Brief Narrative:  Crystal Vargas is a 74 y.o. female who is bed bound and from SNF with history of end-stage renal disease recently started on hemodialysis, history of right MCA stroke, history of dysphagia due to Schatzki's ring and is status post esophageal dilatation who was admitted with altered mental status.  Her daughter noted poor PO intake thought to be due to dysphagia.  In the ED, she was hypotensive and had a K of 2.1 and a Hb of 6.9.  She was given IVF, IV Abx and admitted to the ICU.   Subjective: Continues to have poor appetite. We have discussed a feeding tube again today and she is in agreement. She understands it will be a tube that would go directly into her abdomen and she understands and agrees.     Assessment & Plan:   Principal Problem:   MRSA bacteremia/ septic shock - due to infected dialysis cath - appreciate ID assistance- currently on Daptomycin and Teflaro- awating approval for dapto - repeat cultures 4/9 negative - TEE negative for vegetation- will need permanent IV access for Teflaro for minimal 6 wks- vascular surgery to place this tomorrow  Active Problems: Dysphagia- chronic issue - EGD performed 4/14 revealed and esophageal stenosis that was dilated  - OK to subsequently proceed with TEE for above  Poor oral intake  eating < 50 % of meals  - 4/17> continue to barely eat meals - 4/17>- added Megace- increased Nepro- started calorie count - She does not want a feeding tube  -4/17- calorie count> about 300 kcal taken in - 4/18>  228 Kcal taken in - 4/20> she is wanting a feeding tube today- this would be a PEG tube- I have spoken with Dr Allen Norris who will place it on Thursday- We will need to follow for re-feeding syndrome afterwards- d/w Robin  Acute hypoxic respiratory failure - 4/15> on 3-4 L of O 2 with pulse ox in mid 90s - RR in high  20-low 30 range - Have asked cardiology to hold off on TTE in light of these findings - fluid removed with dialysis on 4/16- no longer hypoxic    ESRD on HD - infected cath removed and tunnelled HD cath placed on 4/12 -cont dialysis per renal team - MWF dialysis with [facility]  H/o HTN - meds being titrated by Renal  Acute anemia (possible related to infection/ acute illness) in addition to CKD - has been transfused 3 U PRBC so far - 4/3 (1 U) and 4/12 ( 2 U)    Diabetes Mellitus 2 - resolved?  - A1c checked on 4/3 was 5.6 and she was not on medications as outpt - CBG in hospital are not elevated- she is barely eating- d/c SSI and CBGs   Status post CVA  - bed bound with left sided weakness (0/5) and left facial droop - hold Plavix for PEG- resume after - cont Statin  Seizure disorder - cont Keppra-    Mild thrombocytopenia - possible due to sepsis- follow  Depression?  - has a flat affect, poor appetite, barely eating but states she is not depressed - cont Remeron and Zoloft  Time spent in minutes: 50 min- d/w daughter, and multiple subspecialists. DVT prophylaxis: SCDs Code Status: Full code Family Communication: speaking with daughter Crystal Vargas Disposition Plan: return to SNF eventually - She has now  agreed to a PEG tube -plan for Thursday and then will watch for re-feeding syndrome-  Consultants:  Nephro CArdiology GI ID Palliative care Procedures:   4/14- EGD Benign-appearing esophageal stenosis  4/12- left IJ  2 D ECHO 1. Left ventricular ejection fraction, by estimation, is 45 to 50%. The  left ventricle has mildly decreased function. The left ventricle has no  regional wall motion abnormalities. There is moderate concentric left  ventricular hypertrophy. Left  ventricular diastolic parameters are consistent with Grade I diastolic  dysfunction (impaired relaxation).  2. Right ventricular systolic function is normal. The right ventricular   size is mildly enlarged. There is moderately elevated pulmonary artery  systolic pressure.  3. Left atrial size was mildly dilated.  4. Right atrial size was mildly dilated.  5. The mitral valve was not well visualized. Mild to moderate mitral  valve regurgitation.  6. Tricuspid valve regurgitation is moderate to severe.  7. The aortic valve is grossly normal. Aortic valve regurgitation is  trivial. No aortic stenosis is present.   4/20 TEE- no vegetation Antimicrobials:  Anti-infectives (From admission, onward)   Start     Dose/Rate Route Frequency Ordered Stop   05/14/2019 1645  clindamycin (CLEOCIN) IVPB 300 mg  Status:  Discontinued     300 mg 100 mL/hr over 30 Minutes Intravenous  Once 05/08/2019 1642 05/23/2019 1804   06/02/2019 1645  clindamycin (CLEOCIN) 300 MG/50ML IVPB    Note to Pharmacy: Genelle Bal   : cabinet override      06/02/2019 1645 05/13/2019 0459   05/08/2019 1600  ceftaroline (TEFLARO) 200 mg in sodium chloride 0.9 % 250 mL IVPB     200 mg 250 mL/hr over 60 Minutes Intravenous Every 8 hours 05/31/2019 1328     05/11/2019 1200  vancomycin (VANCOREADY) IVPB 750 mg/150 mL     750 mg 150 mL/hr over 60 Minutes Intravenous Every Thu (Hemodialysis) 05/08/2019 1126 05/21/2019 1809   05/14/19 2000  DAPTOmycin (CUBICIN) 500 mg in sodium chloride 0.9 % IVPB     500 mg 220 mL/hr over 30 Minutes Intravenous Every 48 hours 05/14/19 1550     05/12/19 1645  vancomycin (VANCOREADY) IVPB 750 mg/150 mL     750 mg 150 mL/hr over 60 Minutes Intravenous  Once 05/12/19 1635 05/12/19 1819   05/12/19 1358  vancomycin variable dose per unstable renal function (pharmacist dosing)  Status:  Discontinued      Does not apply See admin instructions 05/12/19 1358 06/06/2019 1120   05/12/19 1200  vancomycin (VANCOREADY) IVPB 500 mg/100 mL  Status:  Discontinued     500 mg 100 mL/hr over 60 Minutes Intravenous Every M-W-F (Hemodialysis) 06/05/2019 2016 05/13/2019 2018   05/12/19 1200  vancomycin (VANCOREADY)  IVPB 750 mg/150 mL  Status:  Discontinued     750 mg 150 mL/hr over 60 Minutes Intravenous Every M-W-F (Hemodialysis) 05/10/2019 2018 05/12/19 1358   06/05/2019 2100  aztreonam (AZACTAM) 1 g in sodium chloride 0.9 % 100 mL IVPB  Status:  Discontinued     1 g 200 mL/hr over 30 Minutes Intravenous Every 24 hours 06/06/2019 2016 05/11/19 1019   05/08/2019 1500  aztreonam (AZACTAM) injection 2 g  Status:  Discontinued     2 g Intramuscular Once 06/05/2019 1411 05/25/2019 1442   06/01/2019 1445  aztreonam (AZACTAM) 2 g in sodium chloride 0.9 % 100 mL IVPB  Status:  Discontinued     2 g 200 mL/hr over 30 Minutes Intravenous  Once 06/02/2019 1442  05/08/2019 2016   05/09/2019 1430  vancomycin (VANCOREADY) IVPB 1250 mg/250 mL     1,250 mg 166.7 mL/hr over 90 Minutes Intravenous  Once 05/18/2019 1411 05/09/2019 1635       Objective: Vitals:   05/11/2019 0816 05/12/2019 0845 05/31/2019 0905 05/31/2019 1248  BP: (!) 158/65 (!) 159/78 (!) 163/65 133/70  Pulse: 66 65 (!) 59 (!) 59  Resp: (!) 24 (!) 25 20 20   Temp:   98.1 F (36.7 C) 98.5 F (36.9 C)  TempSrc:   Oral Oral  SpO2: 100% 100% 100% 97%  Weight:      Height:       No intake or output data in the 24 hours ending 05/18/2019 1532 Filed Weights   05/24/19 0628 06/01/2019 0630 05/14/2019 0500  Weight: 64.7 kg 74.6 kg 70.4 kg    Examination: General exam: Appears comfortable  HEENT: PERRLA, oral mucosa moist, no sclera icterus or thrush Respiratory system: Clear to auscultation. Respiratory effort normal. Cardiovascular system: S1 & S2 heard,  No murmurs  Gastrointestinal system: Abdomen soft, non-tender, nondistended. Normal bowel sounds   Central nervous system: Alert and oriented. Left side 0/5 weakness, left facial droop Extremities: No cyanosis, clubbing or edema Skin: No rashes or ulcers Psychiatry:  flat affect   Data Reviewed: I have personally reviewed following labs and imaging studies  CBC: Recent Labs  Lab 06/05/2019 0640  WBC 9.2  HGB 10.1*  HCT  30.0*  MCV 90.4  PLT 500*   Basic Metabolic Panel: Recent Labs  Lab 05/13/2019 0640 06/02/2019 1540 05/08/2019 0032 05/18/2019 1558  NA 145  --  142  --   K 3.6 3.6 3.6  --   CL 111  --  106  --   CO2 25  --  23  --   GLUCOSE 135*  --  140*  --   BUN 35*  --  19  --   CREATININE 2.92*  --  1.85*  --   CALCIUM 8.2*  --  8.3*  --   PHOS  --   --   --  2.2*   GFR: Estimated Creatinine Clearance: 23.8 mL/min (A) (by C-G formula based on SCr of 1.85 mg/dL (H)). Liver Function Tests: Recent Labs  Lab 05/23/2019 0640  AST 18  ALT 16  ALKPHOS 82  BILITOT 1.6*  PROT 5.5*  ALBUMIN 2.0*   No results for input(s): LIPASE, AMYLASE in the last 168 hours. No results for input(s): AMMONIA in the last 168 hours. Coagulation Profile: No results for input(s): INR, PROTIME in the last 168 hours. Cardiac Enzymes: Recent Labs  Lab 05/13/2019 0640  CKTOTAL 16*   BNP (last 3 results) No results for input(s): PROBNP in the last 8760 hours. HbA1C: No results for input(s): HGBA1C in the last 72 hours. CBG: Recent Labs  Lab 05/25/19 0743 05/25/19 0918 05/25/19 1130 05/25/19 1645 05/25/19 2113  GLUCAP 143* 149* 155* 112* 178*   Lipid Profile: No results for input(s): CHOL, HDL, LDLCALC, TRIG, CHOLHDL, LDLDIRECT in the last 72 hours. Thyroid Function Tests: No results for input(s): TSH, T4TOTAL, FREET4, T3FREE, THYROIDAB in the last 72 hours. Anemia Panel: No results for input(s): VITAMINB12, FOLATE, FERRITIN, TIBC, IRON, RETICCTPCT in the last 72 hours. Urine analysis:    Component Value Date/Time   COLORURINE YELLOW (A) 05/30/2019 2230   APPEARANCEUR TURBID (A) 05/08/2019 2230   LABSPEC 1.023 05/24/2019 2230   PHURINE 5.0 05/24/2019 2230   GLUCOSEU NEGATIVE 05/29/2019 2230   HGBUR  SMALL (A) 06/01/2019 2230   BILIRUBINUR NEGATIVE 05/23/2019 2230   KETONESUR NEGATIVE 05/25/2019 2230   PROTEINUR 100 (A) 06/06/2019 2230   NITRITE NEGATIVE 05/30/2019 2230   LEUKOCYTESUR MODERATE (A)  06/02/2019 2230   Sepsis Labs: @LABRCNTIP (procalcitonin:4,lacticidven:4) ) Recent Results (from the past 240 hour(s))  Respiratory Panel by RT PCR (Flu A&B, Covid) - Nasopharyngeal Swab     Status: None   Collection Time: 06/01/2019 12:38 PM   Specimen: Nasopharyngeal Swab  Result Value Ref Range Status   SARS Coronavirus 2 by RT PCR NEGATIVE NEGATIVE Final    Comment: (NOTE) SARS-CoV-2 target nucleic acids are NOT DETECTED. The SARS-CoV-2 RNA is generally detectable in upper respiratoy specimens during the acute phase of infection. The lowest concentration of SARS-CoV-2 viral copies this assay can detect is 131 copies/mL. A negative result does not preclude SARS-Cov-2 infection and should not be used as the sole basis for treatment or other patient management decisions. A negative result may occur with  improper specimen collection/handling, submission of specimen other than nasopharyngeal swab, presence of viral mutation(s) within the areas targeted by this assay, and inadequate number of viral copies (<131 copies/mL). A negative result must be combined with clinical observations, patient history, and epidemiological information. The expected result is Negative. Fact Sheet for Patients:  PinkCheek.be Fact Sheet for Healthcare Providers:  GravelBags.it This test is not yet ap proved or cleared by the [place] FDA and  has been authorized for detection and/or diagnosis of SARS-CoV-2 by FDA under an Emergency Use Authorization (EUA). This EUA will remain  in effect (meaning this test can be used) for the duration of the COVID-19 declaration under Section 564(b)(1) of the Act, 21 U.S.C. section 360bbb-3(b)(1), unless the authorization is terminated or revoked sooner.    Influenza A by PCR NEGATIVE NEGATIVE Final   Influenza B by PCR NEGATIVE NEGATIVE Final    Comment: (NOTE) The Xpert Xpress SARS-CoV-2/FLU/RSV assay is  intended as an aid in  the diagnosis of influenza from Nasopharyngeal swab specimens and  should not be used as a sole basis for treatment. Nasal washings and  aspirates are unacceptable for Xpert Xpress SARS-CoV-2/FLU/RSV  testing. Fact Sheet for Patients: PinkCheek.be Fact Sheet for Healthcare Providers: GravelBags.it This test is not yet approved or cleared by the [place] FDA and  has been authorized for detection and/or diagnosis of SARS-CoV-2 by  FDA under an Emergency Use Authorization (EUA). This EUA will remain  in effect (meaning this test can be used) for the duration of the  Covid-19 declaration under Section 564(b)(1) of the Act, 21  U.S.C. section 360bbb-3(b)(1), unless the authorization is  terminated or revoked. Performed at [facility], [address] [place], [place] [address]          Radiology Studies: ECHO TEE  Result Date: 05/16/2019    TRANSESOPHOGEAL ECHO REPORT   Patient Name:   ISABELE Ehmke Date of Exam: 06/01/2019 Medical Rec #:  [id number]  Height:       62.0 in Accession #:    [id number] Weight:       155.2 lb Date of Birth:  06-14-1945  BSA:          1.716 m Patient Age:    13 years   BP:           176/66 mmHg Patient Gender: F          HR:           72 bpm. Exam Location:  [facility] Procedure: Transesophageal Echo, Cardiac Doppler and Color Doppler Indications:     Bacteremia 790.7  History:         Patient has prior history of Echocardiogram examinations, most                  recent 05/11/2019. CAD, Stroke; Risk Factors:Hypertension and                  Diabetes.  Sonographer:     Sherrie Sport RDCS (AE) Referring Phys:  [id number] Teodoro Spray Diagnosing Phys: Bartholome Bill MD PROCEDURE: The transesophogeal probe was passed without difficulty through the esophogus of the patient. Imaged were obtained with the patient in a left lateral decubitus position. Sedation performed by different physician.  The patient was monitored while under deep sedation. Image quality was excellent. The patient developed no complications during the procedure. IMPRESSIONS  1. Left ventricular ejection fraction, by estimation, is 60 to 65%. The left ventricle has normal function. The left ventricle has no regional wall motion abnormalities.  2. Right ventricular systolic function is normal. The right ventricular size is normal.  3. No left atrial/left atrial appendage thrombus was detected.  4. The mitral valve is grossly normal. Trivial mitral valve regurgitation.  5. The aortic valve is tricuspid. Aortic valve regurgitation is trivial. FINDINGS  Left Ventricle: Left ventricular ejection fraction, by estimation, is 60 to 65%. The left ventricle has normal function. The left ventricle has no regional wall motion abnormalities. The left ventricular internal cavity size was normal in size. There is  no left ventricular hypertrophy. Right Ventricle: The right ventricular size is normal. No increase in right ventricular wall thickness. Right ventricular systolic function is normal. Left Atrium: Left atrial size was normal in size. No left atrial/left atrial appendage thrombus was detected. Right Atrium: Right atrial size was normal in size. Pericardium: There is no evidence of pericardial effusion. Mitral Valve: The mitral valve is grossly normal. Trivial mitral valve regurgitation. There is no evidence of mitral valve vegetation. Tricuspid Valve: The tricuspid valve is grossly normal. Tricuspid valve regurgitation is trivial. There is no evidence of tricuspid valve vegetation. Aortic Valve: The aortic valve is tricuspid. Aortic valve regurgitation is trivial. There is no evidence of aortic valve vegetation. Pulmonic Valve: The pulmonic valve was not well visualized. Pulmonic valve regurgitation is trivial. Aorta: The aortic root is normal in size and structure. IAS/Shunts: The interatrial septum was not assessed. Bartholome Bill MD  Electronically signed by Bartholome Bill MD Signature Date/Time: 06/02/2019/12:45:42 PM    Final       Scheduled Meds: . allopurinol  100 mg Oral QPM  . amLODipine  2.5 mg Oral Daily  . vitamin C  500 mg Oral BID  . atorvastatin  10 mg Oral q1800  . brimonidine  1 drop Both Eyes Q12H   And  . timolol  1 drop Both Eyes Q12H  . carvedilol  25 mg Oral BID WC  . Chlorhexidine Gluconate Cloth  6 each Topical Daily  . epoetin (EPOGEN/PROCRIT) injection  4,000 Units Intravenous Q M,W,F-HD  . feeding supplement (NEPRO CARB STEADY)  237 mL Oral QID  . latanoprost  1 drop Both Eyes QPM  . levETIRAcetam  1,000 mg Oral QPM  . megestrol  400 mg Oral BID  . midodrine  5 mg Oral Q M,W,F  . mirtazapine  7.5 mg Oral QHS  . multivitamin  1 tablet Oral QHS  . pantoprazole  40 mg Oral QHS  .  sertraline  50 mg Oral Daily   Continuous Infusions: . sodium chloride 20 mL (06/01/2019 1812)  . sodium chloride    . ceFTAROline (TEFLARO) IV 200 mg (05/25/2019 1148)  . DAPTOmycin (CUBICIN)  IV 500 mg (06/06/2019 2354)     LOS: 17 days      Debbe Odea, MD [facility] Pager: [email or web address] 05/30/2019, 3:32 PM

## 2019-05-27 NOTE — Progress Notes (Signed)
*  PRELIMINARY RESULTS* Echocardiogram Echocardiogram Transesophageal has been performed.  Sherrie Sport 05/09/2019, 8:20 AM

## 2019-05-27 NOTE — Treatment Plan (Signed)
Diagnosis: MRSA bacteremia ESRD      Allergies  Allergen Reactions  . Abacavir Other (See Comments)  . Cephalosporins Other (See Comments)    Tolerated ceftaroline April 2021  . Ciprofloxacin Other (See Comments)  . Nsaids Other (See Comments)  . Penicillin G Other (See Comments)  . Quinolones Other (See Comments)  . Salicylates Other (See Comments)    OPAT Orders Discharge antibiotics: Daptomycin 500mg  IV given during dialysis  Three times a week until  06/20/19 Ceftaroline 200mg  every 8 hours to given in the nursing home until 06/20/19   Duration: Total of 6 weeks  Tunneled catheter  Care Per Protocol:  Labs weekly while on IV antibiotics at the dialysis center : X__ CBC with differential  _X_ CMP  X CPK    ___ Please leave line in place until doctor has seen patient or been notified  Fax weekly labs to [facility] [phone] Clinic Follow Up Appt:4 weeks May 18th 10.30 AM  Call [phone] to change appt or any questions

## 2019-05-27 NOTE — Progress Notes (Signed)
OT Cancellation Note  Patient Details Name: Crystal Vargas MRN: [id number] DOB: November 04, 1945   Cancelled Treatment:    Reason Eval/Treat Not Completed: Patient at procedure or test/ unavailable  Pt just presenting to unit from specials when OT presents. Will f/u at later date/time as able for OT tx. Thank you.  Gerrianne Scale, [place], OTR/L ascom [phone] 05/23/2019, 9:03 AM

## 2019-05-27 NOTE — Progress Notes (Signed)
PT Cancellation Note  Patient Details Name: Crystal Vargas MRN: [id number] DOB: August 02, 1945   Cancelled Treatment:    Reason Eval/Treat Not Completed: Patient at procedure or test/unavailable. Nsg is giving medications and then CNA needs to feed her.    [address] [place], [place] DPT 06/02/2019, 10:13 AM

## 2019-05-28 ENCOUNTER — Encounter: Payer: Self-pay | Admitting: Cardiology

## 2019-05-28 ENCOUNTER — Inpatient Hospital Stay: Admission: EM | Disposition: E | Payer: Self-pay | Source: Home / Self Care | Attending: Internal Medicine

## 2019-05-28 DIAGNOSIS — A419 Sepsis, unspecified organism: Secondary | ICD-10-CM

## 2019-05-28 DIAGNOSIS — R7881 Bacteremia: Secondary | ICD-10-CM | POA: Diagnosis not present

## 2019-05-28 DIAGNOSIS — B9562 Methicillin resistant Staphylococcus aureus infection as the cause of diseases classified elsewhere: Secondary | ICD-10-CM | POA: Diagnosis not present

## 2019-05-28 HISTORY — PX: CENTRAL LINE INSERTION-TUNNELED: CATH118291

## 2019-05-28 LAB — BASIC METABOLIC PANEL
Anion gap: 5 (ref 5–15)
BUN: 17 mg/dL (ref 8–23)
CO2: 27 mmol/L (ref 22–32)
Calcium: 8.2 mg/dL — ABNORMAL LOW (ref 8.9–10.3)
Chloride: 109 mmol/L (ref 98–111)
Creatinine, Ser: 2.54 mg/dL — ABNORMAL HIGH (ref 0.44–1.00)
GFR calc Af Amer: 21 mL/min — ABNORMAL LOW (ref >60)
GFR calc non Af Amer: 18 mL/min — ABNORMAL LOW (ref >60)
Glucose, Bld: 130 mg/dL — ABNORMAL HIGH (ref 70–99)
Potassium: 3.5 mmol/L (ref 3.5–5.1)
Sodium: 141 mmol/L (ref 135–145)

## 2019-05-28 LAB — GLUCOSE, CAPILLARY: Glucose-Capillary: 90 mg/dL (ref 70–99)

## 2019-05-28 LAB — CK: Total CK: 166 U/L (ref 38–234)

## 2019-05-28 SURGERY — CENTRAL LINE INSERTION-TUNNELED
Anesthesia: Choice | Laterality: Right

## 2019-05-28 MED ORDER — FAMOTIDINE 20 MG PO TABS: 40.0000 mg | ORAL_TABLET | Freq: Once | ORAL | Status: DC | PRN

## 2019-05-28 MED ORDER — ONDANSETRON HCL 4 MG/2ML IJ SOLN: 4.0000 mg | Freq: Four times a day (QID) | INTRAMUSCULAR | Status: DC | PRN

## 2019-05-28 MED ORDER — SODIUM CHLORIDE 0.9 % IV SOLN: INTRAVENOUS | Status: DC

## 2019-05-28 MED ORDER — FENTANYL CITRATE (PF) 100 MCG/2ML IJ SOLN
INTRAMUSCULAR | Status: AC
  Filled 2019-05-28: qty 2

## 2019-05-28 MED ORDER — METHYLPREDNISOLONE SODIUM SUCC 125 MG IJ SOLR: 125.0000 mg | Freq: Once | INTRAMUSCULAR | Status: DC | PRN

## 2019-05-28 MED ORDER — FENTANYL CITRATE (PF) 100 MCG/2ML IJ SOLN
INTRAMUSCULAR | Status: DC | PRN
  Administered 2019-05-28: 50 ug via INTRAVENOUS

## 2019-05-28 MED ORDER — HYDROMORPHONE HCL 1 MG/ML IJ SOLN: 1.0000 mg | Freq: Once | INTRAMUSCULAR | Status: DC | PRN

## 2019-05-28 MED ORDER — MIDAZOLAM HCL 2 MG/ML PO SYRP: 8.0000 mg | ORAL_SOLUTION | Freq: Once | ORAL | Status: DC | PRN

## 2019-05-28 MED ORDER — CLINDAMYCIN PHOSPHATE 300 MG/50ML IV SOLN
300.0000 mg | Freq: Once | INTRAVENOUS | Status: AC
  Administered 2019-05-28: 300 mg via INTRAVENOUS

## 2019-05-28 MED ORDER — ALTEPLASE 2 MG IJ SOLR
2.0000 mg | Freq: Once | INTRAMUSCULAR | Status: AC
  Administered 2019-05-28: 2 mg
  Filled 2019-05-28: qty 2

## 2019-05-28 MED ORDER — DIPHENHYDRAMINE HCL 50 MG/ML IJ SOLN: 50.0000 mg | Freq: Once | INTRAMUSCULAR | Status: DC | PRN

## 2019-05-28 MED ORDER — CLINDAMYCIN PHOSPHATE 300 MG/50ML IV SOLN
INTRAVENOUS | Status: AC
  Filled 2019-05-28: qty 50

## 2019-05-28 MED ORDER — MIDAZOLAM HCL 2 MG/2ML IJ SOLN
INTRAMUSCULAR | Status: DC | PRN
  Administered 2019-05-28: 2 mg via INTRAVENOUS

## 2019-05-28 MED ORDER — MIDAZOLAM HCL 5 MG/5ML IJ SOLN
INTRAMUSCULAR | Status: AC
  Filled 2019-05-28: qty 5

## 2019-05-28 SURGICAL SUPPLY — 8 items
DERMABOND ADVANCED (GAUZE/BANDAGES/DRESSINGS) ×2
DERMABOND ADVANCED .7 DNX12 (GAUZE/BANDAGES/DRESSINGS) IMPLANT
DRAPE ORTHO 2.5IN SPLIT 77X108 (DRAPES) IMPLANT
DRAPE ORTHO SPLIT 77X108 STRL (DRAPES) ×2
KIT SINGLE LUMEN POWERLINE 5FR (CATHETERS) ×2 IMPLANT
PACK ANGIOGRAPHY (CUSTOM PROCEDURE TRAY) ×2 IMPLANT
SUT MNCRL AB 4-0 PS2 18 (SUTURE) ×4 IMPLANT
SUT SILK 0 FSL (SUTURE) ×2 IMPLANT

## 2019-05-28 NOTE — Progress Notes (Addendum)
[facility] [place] NAME: Crystal Vargas    MR#:  [id number]  DATE OF BIRTH:  1945-08-25  SUBJECTIVE:   Pt sleepy--not much conversive. No new issues per RN REVIEW OF SYSTEMS:   Review of Systems  Unable to perform ROS: Medical condition   Tolerating Diet:npo Tolerating PT: bedbound  DRUG ALLERGIES:   Allergies  Allergen Reactions  . Abacavir Other (See Comments)  . Cephalosporins Other (See Comments)    Tolerated ceftaroline April 2021  . Ciprofloxacin Other (See Comments)  . Nsaids Other (See Comments)  . Penicillin G Other (See Comments)  . Quinolones Other (See Comments)  . Salicylates Other (See Comments)    VITALS:  Blood pressure (!) 157/70, pulse (!) 56, temperature 97.6 F (36.4 C), temperature source Axillary, resp. rate 18, height 5\' 2"  (1.575 m), weight 70.4 kg, SpO2 100 %.  PHYSICAL EXAMINATION:   Physical Exam  GENERAL:  74 y.o.-year-old patient lying in the bed with no acute distress.  EYES: Pupils equal, round, reactive to light and accommodation. No scleral icterus.   HEENT: Head atraumatic, normocephalic. Oropharynx and nasopharynx clear.  NECK:  Supple, no jugular venous distention. No thyroid enlargement, no tenderness.  LUNGS: Normal breath sounds bilaterally, no wheezing, rales, rhonchi. No use of accessory muscles of respiration.  CARDIOVASCULAR: S1, S2 normal. No murmurs, rubs, or gallops.  ABDOMEN: Soft, nontender, nondistended. Bowel sounds present. No organomegaly or mass.  EXTREMITIES: No cyanosis, clubbing or edema b/l.    NEUROLOGIC: chronic left hemiparesis PSYCHIATRIC:  patient has flat affect SKIN:as below Pressure Injury 05/09/2019 Heel Left;Medial Unstageable - Full thickness tissue loss in which the base of the injury is covered by slough (yellow, tan, gray, green or brown) and/or eschar (tan, brown or black) in the wound bed. unstagable with eshcar on le (Active)  05/23/2019 1830  Location:  Heel  Location Orientation: Left;Medial  Staging: Unstageable - Full thickness tissue loss in which the base of the injury is covered by slough (yellow, tan, gray, green or brown) and/or eschar (tan, brown or black) in the wound bed.  Wound Description (Comments): unstagable with eshcar on left heel  Present on Admission: Yes     Pressure Injury 05/18/2019 Toe (Comment  which one) Left;Medial Deep Tissue Pressure Injury - Purple or maroon localized area of discolored intact skin or blood-filled blister due to damage of underlying soft tissue from pressure and/or shear. deep tissure (Active)  05/24/2019 1830  Location: Toe (Comment  which one) (left big toe)  Location Orientation: Left;Medial  Staging: Deep Tissue Pressure Injury - Purple or maroon localized area of discolored intact skin or blood-filled blister due to damage of underlying soft tissue from pressure and/or shear.  Wound Description (Comments): deep tissure injury on left big toe  Present on Admission: Yes     Pressure Injury 05/09/2019 Sacrum Medial Stage 2 -  Partial thickness loss of dermis presenting as a shallow open injury with a red, pink wound bed without slough. Pink (Active)  05/24/2019 1000  Location: Sacrum  Location Orientation: Medial  Staging: Stage 2 -  Partial thickness loss of dermis presenting as a shallow open injury with a red, pink wound bed without slough.  Wound Description (Comments): Pink  Present on Admission:       LABORATORY PANEL:  CBC No results for input(s): WBC, HGB, HCT, PLT in the last 168 hours.  Chemistries  Recent Labs  Lab 06/04/2019 0654  NA 141  K 3.5  CL 109  CO2 27  GLUCOSE 130*  BUN 17  CREATININE 2.54*  CALCIUM 8.2*   Cardiac Enzymes No results for input(s): TROPONINI in the last 168 hours. RADIOLOGY:  PERIPHERAL VASCULAR CATHETERIZATION  Result Date: 05/23/2019 See Op Note  ECHO TEE  Result Date: 05/18/2019    TRANSESOPHOGEAL ECHO REPORT   Patient Name:   Crystal Vargas  Date of Exam: 05/31/2019 Medical Rec #:  [id number]  Height:       62.0 in Accession #:    [id number] Weight:       155.2 lb Date of Birth:  March 03, 1945  BSA:          1.716 m Patient Age:    74 years   BP:           176/66 mmHg Patient Gender: F          HR:           72 bpm. Exam Location:  [facility] Procedure: Transesophageal Echo, Cardiac Doppler and Color Doppler Indications:     Bacteremia 790.7  History:         Patient has prior history of Echocardiogram examinations, most                  recent 05/11/2019. CAD, Stroke; Risk Factors:Hypertension and                  Diabetes.  Sonographer:     Sherrie Sport RDCS (AE) Referring Phys:  [id number] Teodoro Spray Diagnosing Phys: Bartholome Bill MD PROCEDURE: The transesophogeal probe was passed without difficulty through the esophogus of the patient. Imaged were obtained with the patient in a left lateral decubitus position. Sedation performed by different physician. The patient was monitored while under deep sedation. Image quality was excellent. The patient developed no complications during the procedure. IMPRESSIONS  1. Left ventricular ejection fraction, by estimation, is 60 to 65%. The left ventricle has normal function. The left ventricle has no regional wall motion abnormalities.  2. Right ventricular systolic function is normal. The right ventricular size is normal.  3. No left atrial/left atrial appendage thrombus was detected.  4. The mitral valve is grossly normal. Trivial mitral valve regurgitation.  5. The aortic valve is tricuspid. Aortic valve regurgitation is trivial. FINDINGS  Left Ventricle: Left ventricular ejection fraction, by estimation, is 60 to 65%. The left ventricle has normal function. The left ventricle has no regional wall motion abnormalities. The left ventricular internal cavity size was normal in size. There is  no left ventricular hypertrophy. Right Ventricle: The right ventricular size is normal. No increase in right ventricular wall thickness.  Right ventricular systolic function is normal. Left Atrium: Left atrial size was normal in size. No left atrial/left atrial appendage thrombus was detected. Right Atrium: Right atrial size was normal in size. Pericardium: There is no evidence of pericardial effusion. Mitral Valve: The mitral valve is grossly normal. Trivial mitral valve regurgitation. There is no evidence of mitral valve vegetation. Tricuspid Valve: The tricuspid valve is grossly normal. Tricuspid valve regurgitation is trivial. There is no evidence of tricuspid valve vegetation. Aortic Valve: The aortic valve is tricuspid. Aortic valve regurgitation is trivial. There is no evidence of aortic valve vegetation. Pulmonic Valve: The pulmonic valve was not well visualized. Pulmonic valve regurgitation is trivial. Aorta: The aortic root is normal in size and structure. IAS/Shunts: The interatrial septum was not assessed. Bartholome Bill MD Electronically signed by Bartholome Bill MD Signature Date/Time:  06/06/2019/12:45:42 PM    Final    ASSESSMENT AND PLAN:  Crystal Vargas is a 74 y.o.femalewho is bed bound and from SNF with history ofend-stage renal disease recently started on hemodialysis, history of right MCA stroke, history of dysphagia due to Schatzki's ring and is status post esophageal dilatation who was admitted with altered mental status.Her daughter noted poor PO intake thought to be due to dysphagia.   MRSA bacteremia/ septic shock -due to infected dialysis cath - appreciate ID assistance- currently on Daptomycin and Teflaro- awating approval for dapto - repeat cultures 4/9 negative - TEE negative for vegetation -s/p  IV Hickman catheter placement by Dr schnier on 05/22/2019 for Teflaro for minimal 6 wks  Dysphagia- chronic issue - EGD performed 4/14 revealed and esophageal stenosis that was dilated - OK to subsequently proceed with TEE for above  Poor oral intake  eating < 50 % of meals  - 4/17> continue to barely eat meals -  4/17>- added Megace- increased Nepro- started calorie count - 4/20> she is wanting a feeding tube today- this would be a PEG tube-  Dr Wynelle Cleveland  have spoken with Dr Allen Norris who will place it on Thursday -- We will need to follow for re-feeding syndrome afterwards-pt's dter Crystal Vargas is aware  Acute hypoxic respiratory failure - 4/15> on 3-4 L of O 2 with pulse ox in mid 90s - RR in high 20-low 30 range - fluid removed with dialysis on 4/16- no longer hypoxic    ESRD on HD - infected cath removed and tunnelled HD cath placed on 4/12 -cont dialysis per renal team - MWF dialysis with [facility]  HTN - meds being titrated by Renal  Acute anemia (possible related to infection/ acute illness) in addition to CKD - has been transfused 3 U PRBC so far - 4/3 (1 U) and 4/12 ( 2 U)   Diabetes Mellitus 2 with ESRD  - A1c checked on 4/3 was 5.6 and she was not on medications as outpt - CBG in hospital are not elevated- she is barely eating- d/c SSI and CBGs   Status post CVA - bed bound with left sided weakness (0/5) and left facial droop - hold Plavix for PEG- resume after - cont Statin  Seizure disorder - cont Keppra  Mild thrombocytopenia - possible due to sepsis- follow  Depression?  - has a flat affect, poor appetite, barely eating but states she is not depressed - cont Remeron and Zoloft  DVT prophylaxis: SCDs Code Status: Full code Family Communication: speaking with daughter Crystal Vargas Disposition Plan: return to SNF eventually - She has now agreed to a PEG tube -plan for Thursday and then will watch for re-feeding syndrome for few days Consultants: Nephro,CArdiology,GI,ID,Palliative care Procedures:  4/14- EGDBenign-appearing esophageal stenosis 4/12- left IJ 2 D ECHO  Status is: Inpatient Dispo: The patient is from:rehab              Anticipated d/c is to her SNF              Anticipated d/c date is: in 2-4 days after PEG placed need to monitor of refeeding  syndrome for few days              Patient currently best at baseline. Multiple co-morbidities. Long term prognosis poor  TOTAL TIME TAKING CARE OF THIS PATIENT: *30* minutes.  >50% time spent on counselling and coordination of care  Note: This dictation was prepared with Dragon dictation along with smaller  Company secretary. Any transcriptional errors that result from this process are unintentional.  Fritzi Mandes M.D    [facility]   CC: Primary care physician; Marisa Hua, MDPatient ID: Crystal Vargas, female   DOB: Mar 19, 1945, 74 y.o.   MRN: [id number]

## 2019-05-28 NOTE — H&P (Signed)
[facility] History & Physical Update  The patient was interviewed and re-examined.  The patient's previous History and Physical has been reviewed and is unchanged.  There is no change in the plan of care. We plan to proceed with the scheduled procedure.  Hortencia Pilar, MD  05/08/2019, 12:06 PM

## 2019-05-28 NOTE — Progress Notes (Signed)
Cathflo placed per MD order for 45 minutes. Unable to achieve prescribed BFR due to increased AP.

## 2019-05-28 NOTE — Progress Notes (Signed)
Unable to achieve prescribed BFR due to increased AP. Troubleshooting completed without success.

## 2019-05-28 NOTE — Progress Notes (Signed)
HD treatment initated

## 2019-05-28 NOTE — Progress Notes (Signed)
[facility]  ROUNDING NOTE   Subjective:  Patient due for dialysis treatment today. Case discussed with pharmacy. He is being treated with ceftaroline as well as daptomycin.   Objective:  Vital signs in last 24 hours:  Temp:  [97.6 F (36.4 C)-98.6 F (37 C)] 97.6 F (36.4 C) (04/21 1340) Pulse Rate:  [53-63] 63 (04/21 1500) Resp:  [9-24] 18 (04/21 1500) BP: (102-177)/(43-77) 177/72 (04/21 1500) SpO2:  [91 %-100 %] 100 % (04/21 1400)  Weight change:  Filed Weights   05/24/19 0628 05/16/2019 0630 05/31/2019 0500  Weight: 64.7 kg 74.6 kg 70.4 kg    Intake/Output: No intake/output data recorded.   Intake/Output this shift:  No intake/output data recorded.  Physical Exam: General: Ill appearing  Head: Normocephalic, atraumatic.   Eyes: Anicteric  Neck: Supple  Lungs:  Clear bilateral, normal effort  Heart: Regular rhythm  Abdomen:  Soft, nontender   Extremities: No peripheral edema. Feet in soft supports  Neurologic: Awake, alert today.  Skin: No lesions  Access: Left IJ PermCath    Basic Metabolic Panel: Recent Labs  Lab 05/13/2019 1540 05/24/2019 0032 05/28/2019 1558 05/25/2019 0654  NA  --  142  --  141  K 3.6 3.6  --  3.5  CL  --  106  --  109  CO2  --  23  --  27  GLUCOSE  --  140*  --  130*  BUN  --  19  --  17  CREATININE  --  1.85*  --  2.54*  CALCIUM  --  8.3*  --  8.2*  PHOS  --   --  2.2*  --     Liver Function Tests: No results for input(s): AST, ALT, ALKPHOS, BILITOT, PROT, ALBUMIN in the last 168 hours. No results for input(s): LIPASE, AMYLASE in the last 168 hours. No results for input(s): AMMONIA in the last 168 hours.  CBC: No results for input(s): WBC, NEUTROABS, HGB, HCT, MCV, PLT in the last 168 hours.  Cardiac Enzymes: Recent Labs  Lab 05/13/2019 0654  CKTOTAL 166    BNP: Invalid input(s): POCBNP  CBG: Recent Labs  Lab 05/25/19 0918 05/25/19 1130 05/25/19 1645 05/25/19 2113 06/06/2019 1132  GLUCAP 149* 155* 112*  178* 90    Microbiology: Results for orders placed or performed during the hospital encounter of 05/18/2019  Blood culture (routine x 2)     Status: Abnormal   Collection Time: 05/09/2019 12:34 PM   Specimen: BLOOD  Result Value Ref Range Status   Specimen Description   Final    BLOOD R UP ARM Performed at [facility], [address] [place], [place] [address]    Special Requests   Final    BOTTLES DRAWN AEROBIC AND ANAEROBIC Blood Culture adequate volume Performed at [facility], [address] [place], [place] [address]    Culture  Setup Time   Final    Organism ID to follow IN [place] TO, READ BACK BY AND VERIFIED WITH: [place] ON 05/11/19 AT 0210 [facility] Performed at [facility], [address] [place], [place] [address]    Culture (A)  Final    STAPHYLOCOCCUS AUREUS SUSCEPTIBILITIES PERFORMED ON PREVIOUS CULTURE WITHIN THE LAST 5 DAYS. Performed at [facility], [place] [address], [place],  [address]    Report Status 05/13/2019 FINAL  Final  Blood Culture ID Panel (Reflexed)  Status: Abnormal   Collection Time: 05/17/2019 12:34 PM  Result Value Ref Range Status   Enterococcus species NOT DETECTED NOT DETECTED Final   Listeria monocytogenes NOT DETECTED NOT DETECTED Final   Staphylococcus species DETECTED (A) NOT DETECTED Final    Comment: CRITICAL RESULT CALLED TO, READ BACK BY AND VERIFIED WITH: [place] ON 05/11/19 AT 0210 [facility]    Staphylococcus aureus (BCID) DETECTED (A) NOT DETECTED Final    Comment: Methicillin (oxacillin)-resistant Staphylococcus aureus (MRSA). MRSA is predictably resistant to beta-lactam antibiotics (except ceftaroline). Preferred therapy is vancomycin unless clinically contraindicated. Patient requires contact precautions if  hospitalized. CRITICAL RESULT CALLED TO, READ BACK BY AND VERIFIED WITH: SCOTT HALL ON 05/11/19 AT 0210 [facility]     Methicillin resistance DETECTED (A) NOT DETECTED Final    Comment: CRITICAL RESULT CALLED TO, READ BACK BY AND VERIFIED WITH: SCOTT HALL ON 05/11/19 AT 0210 [facility]    Streptococcus species NOT DETECTED NOT DETECTED Final   Streptococcus agalactiae NOT DETECTED NOT DETECTED Final   Streptococcus pneumoniae NOT DETECTED NOT DETECTED Final   Streptococcus pyogenes NOT DETECTED NOT DETECTED Final   Acinetobacter baumannii NOT DETECTED NOT DETECTED Final   Enterobacteriaceae species NOT DETECTED NOT DETECTED Final   Enterobacter cloacae complex NOT DETECTED NOT DETECTED Final   Escherichia coli NOT DETECTED NOT DETECTED Final   Klebsiella oxytoca NOT DETECTED NOT DETECTED Final   Klebsiella pneumoniae NOT DETECTED NOT DETECTED Final   Proteus species NOT DETECTED NOT DETECTED Final   Serratia marcescens NOT DETECTED NOT DETECTED Final   Haemophilus influenzae NOT DETECTED NOT DETECTED Final   Neisseria meningitidis NOT DETECTED NOT DETECTED Final   Pseudomonas aeruginosa NOT DETECTED NOT DETECTED Final   Candida albicans NOT DETECTED NOT DETECTED Final   Candida glabrata NOT DETECTED NOT DETECTED Final   Candida krusei NOT DETECTED NOT DETECTED Final   Candida parapsilosis NOT DETECTED NOT DETECTED Final   Candida tropicalis NOT DETECTED NOT DETECTED Final    Comment: Performed at [facility], [address] [place], [place] [address]  Blood culture (routine x 2)     Status: Abnormal   Collection Time: 05/17/2019  1:24 PM   Specimen: BLOOD  Result Value Ref Range Status   Specimen Description   Final    BLOOD R LATREAL BICEP Performed at [facility], [address] [place], [place] [address]    Special Requests   Final    BOTTLES DRAWN AEROBIC AND ANAEROBIC Blood Culture adequate volume Performed at [facility], [address] [place], [place] [address]    Culture  Setup Time   Final    IN BOTH AEROBIC AND ANAEROBIC BOTTLES GRAM POSITIVE COCCI CRITICAL  VALUE NOTED.  VALUE IS CONSISTENT WITH PREVIOUSLY REPORTED AND CALLED VALUE. Performed at [facility], [address] [place], [place] [address]    Culture METHICILLIN RESISTANT STAPHYLOCOCCUS AUREUS (A)  Final   Report Status 05/13/2019 FINAL  Final   Organism ID, Bacteria METHICILLIN RESISTANT STAPHYLOCOCCUS AUREUS  Final      Susceptibility   Methicillin resistant staphylococcus aureus - MIC*    CIPROFLOXACIN >=8 RESISTANT Resistant     ERYTHROMYCIN >=8 RESISTANT Resistant     GENTAMICIN <=0.5 SENSITIVE Sensitive     OXACILLIN >=4 RESISTANT Resistant     TETRACYCLINE <=1 SENSITIVE Sensitive     VANCOMYCIN <=0.5 SENSITIVE Sensitive     TRIMETH/SULFA >=320 RESISTANT Resistant     CLINDAMYCIN <=0.25 SENSITIVE Sensitive  RIFAMPIN <=0.5 SENSITIVE Sensitive     Inducible Clindamycin NEGATIVE Sensitive     * METHICILLIN RESISTANT STAPHYLOCOCCUS AUREUS  Respiratory Panel by RT PCR (Flu A&B, Covid) - Nasopharyngeal Swab     Status: None   Collection Time: 06/05/2019  1:24 PM   Specimen: Nasopharyngeal Swab  Result Value Ref Range Status   SARS Coronavirus 2 by RT PCR NEGATIVE NEGATIVE Final    Comment: (NOTE) SARS-CoV-2 target nucleic acids are NOT DETECTED. The SARS-CoV-2 RNA is generally detectable in upper respiratoy specimens during the acute phase of infection. The lowest concentration of SARS-CoV-2 viral copies this assay can detect is 131 copies/mL. A negative result does not preclude SARS-Cov-2 infection and should not be used as the sole basis for treatment or other patient management decisions. A negative result may occur with  improper specimen collection/handling, submission of specimen other than nasopharyngeal swab, presence of viral mutation(s) within the areas targeted by this assay, and inadequate number of viral copies (<131 copies/mL). A negative result must be combined with clinical observations, patient history, and epidemiological information.  The expected result is Negative. Fact Sheet for Patients:  PinkCheek.be Fact Sheet for Healthcare Providers:  GravelBags.it This test is not yet ap proved or cleared by the [place] FDA and  has been authorized for detection and/or diagnosis of SARS-CoV-2 by FDA under an Emergency Use Authorization (EUA). This EUA will remain  in effect (meaning this test can be used) for the duration of the COVID-19 declaration under Section 564(b)(1) of the Act, 21 U.S.C. section 360bbb-3(b)(1), unless the authorization is terminated or revoked sooner.    Influenza A by PCR NEGATIVE NEGATIVE Final   Influenza B by PCR NEGATIVE NEGATIVE Final    Comment: (NOTE) The Xpert Xpress SARS-CoV-2/FLU/RSV assay is intended as an aid in  the diagnosis of influenza from Nasopharyngeal swab specimens and  should not be used as a sole basis for treatment. Nasal washings and  aspirates are unacceptable for Xpert Xpress SARS-CoV-2/FLU/RSV  testing. Fact Sheet for Patients: PinkCheek.be Fact Sheet for Healthcare Providers: GravelBags.it This test is not yet approved or cleared by the [place] FDA and  has been authorized for detection and/or diagnosis of SARS-CoV-2 by  FDA under an Emergency Use Authorization (EUA). This EUA will remain  in effect (meaning this test can be used) for the duration of the  Covid-19 declaration under Section 564(b)(1) of the Act, 21  U.S.C. section 360bbb-3(b)(1), unless the authorization is  terminated or revoked. Performed at [facility], [address] [place], [place] [address]   MRSA PCR Screening     Status: None   Collection Time: 05/11/2019  6:27 PM   Specimen: Nasopharyngeal  Result Value Ref Range Status   MRSA by PCR NEGATIVE NEGATIVE Final    Comment:        The GeneXpert MRSA Assay (FDA approved for NASAL specimens only), is one  component of a comprehensive MRSA colonization surveillance program. It is not intended to diagnose MRSA infection nor to guide or monitor treatment for MRSA infections. Performed at [facility], [address] [place], [place] [address]   Urine culture     Status: Abnormal   Collection Time: 05/23/2019 10:30 PM   Specimen: Urine, Clean Catch  Result Value Ref Range Status   Specimen Description   Final    URINE, CLEAN CATCH Performed at [facility], [address] [place], [place] [address]    Special Requests   Final  NONE Performed at [facility], [address] [place], [place] [address]    Culture (A)  Final    >=100,000 COLONIES/mL KLEBSIELLA PNEUMONIAE >=100,000 COLONIES/mL VANCOMYCIN RESISTANT ENTEROCOCCUS    Report Status 05/14/2019 FINAL  Final   Organism ID, Bacteria KLEBSIELLA PNEUMONIAE (A)  Final   Organism ID, Bacteria VANCOMYCIN RESISTANT ENTEROCOCCUS (A)  Final      Susceptibility   Klebsiella pneumoniae - MIC*    AMPICILLIN >=32 RESISTANT Resistant     CEFAZOLIN <=4 SENSITIVE Sensitive     CEFTRIAXONE <=0.25 SENSITIVE Sensitive     CIPROFLOXACIN <=0.25 SENSITIVE Sensitive     GENTAMICIN <=1 SENSITIVE Sensitive     IMIPENEM 0.5 SENSITIVE Sensitive     NITROFURANTOIN 64 INTERMEDIATE Intermediate     TRIMETH/SULFA <=20 SENSITIVE Sensitive     AMPICILLIN/SULBACTAM 4 SENSITIVE Sensitive     PIP/TAZO <=4 SENSITIVE Sensitive     * >=100,000 COLONIES/mL KLEBSIELLA PNEUMONIAE   Vancomycin resistant enterococcus - MIC*    AMPICILLIN >=32 RESISTANT Resistant     NITROFURANTOIN 256 RESISTANT Resistant     VANCOMYCIN >=32 RESISTANT Resistant     LINEZOLID 2 SENSITIVE Sensitive     * >=100,000 COLONIES/mL VANCOMYCIN RESISTANT ENTEROCOCCUS  Cath Tip Culture     Status: Abnormal   Collection Time: 05/11/19  1:10 PM   Specimen: Catheter Tip; Other  Result Value Ref Range Status   Specimen Description   Final    CATH  TIP Performed at [facility], [address] [place], [place] [address]    Special Requests   Final    NONE Performed at [facility], [address] [place], [place] [address]    Culture (A)  Final    >=100,000 COLONIES/mL METHICILLIN RESISTANT STAPHYLOCOCCUS AUREUS   Report Status 05/13/2019 FINAL  Final   Organism ID, Bacteria METHICILLIN RESISTANT STAPHYLOCOCCUS AUREUS (A)  Final      Susceptibility   Methicillin resistant staphylococcus aureus - MIC*    CIPROFLOXACIN >=8 RESISTANT Resistant     ERYTHROMYCIN >=8 RESISTANT Resistant     GENTAMICIN <=0.5 SENSITIVE Sensitive     OXACILLIN >=4 RESISTANT Resistant     TETRACYCLINE <=1 SENSITIVE Sensitive     VANCOMYCIN 1 SENSITIVE Sensitive     TRIMETH/SULFA >=320 RESISTANT Resistant     CLINDAMYCIN <=0.25 SENSITIVE Sensitive     RIFAMPIN <=0.5 SENSITIVE Sensitive     Inducible Clindamycin NEGATIVE Sensitive     * >=100,000 COLONIES/mL METHICILLIN RESISTANT STAPHYLOCOCCUS AUREUS  Culture, blood (routine x 2)     Status: Abnormal   Collection Time: 05/11/19  4:27 PM   Specimen: BLOOD  Result Value Ref Range Status   Specimen Description   Final    BLOOD A-LINE Performed at [facility], [address], [place], [place] [address]    Special Requests   Final    BOTTLES DRAWN AEROBIC AND ANAEROBIC Blood Culture adequate volume Performed at [facility], [address] [place], [place] [address]    Culture  Setup Time   Final    GRAM POSITIVE COCCI ANAEROBIC BOTTLE ONLY CRITICAL VALUE NOTED.  VALUE IS CONSISTENT WITH PREVIOUSLY REPORTED AND CALLED VALUE. Performed at [facility], [address] [place], [place] [address]    Culture (A)  Final    STAPHYLOCOCCUS AUREUS SUSCEPTIBILITIES PERFORMED ON PREVIOUS CULTURE WITHIN THE LAST 5 DAYS. Performed at [facility], [place] [address], [place], [place] [address]    Report Status 06/05/2019  FINAL  Final  Culture, blood  (routine x 2)     Status: Abnormal   Collection Time: 05/12/19  6:06 AM   Specimen: BLOOD  Result Value Ref Range Status   Specimen Description BLOOD BLOOD RIGHT HAND  Final   Special Requests   Final    BOTTLES DRAWN AEROBIC AND ANAEROBIC Blood Culture adequate volume   Culture  Setup Time   Final    GRAM POSITIVE COCCI ANAEROBIC BOTTLE ONLY CRITICAL RESULT CALLED TO, READ BACK BY AND VERIFIED WITH: DAVID BESANTI AT 10PM ON 05/12/2019 BY MOSLEY,J    Culture METHICILLIN RESISTANT STAPHYLOCOCCUS AUREUS (A)  Final   Report Status 05/23/2019 FINAL  Final   Organism ID, Bacteria METHICILLIN RESISTANT STAPHYLOCOCCUS AUREUS  Final      Susceptibility   Methicillin resistant staphylococcus aureus - MIC*    CIPROFLOXACIN >=8 RESISTANT Resistant     ERYTHROMYCIN >=8 RESISTANT Resistant     GENTAMICIN <=0.5 SENSITIVE Sensitive     OXACILLIN >=4 RESISTANT Resistant     TETRACYCLINE <=1 SENSITIVE Sensitive     VANCOMYCIN 1 SENSITIVE Sensitive     TRIMETH/SULFA >=320 RESISTANT Resistant     CLINDAMYCIN <=0.25 SENSITIVE Sensitive     RIFAMPIN <=0.5 SENSITIVE Sensitive     Inducible Clindamycin NEGATIVE Sensitive     LINEZOLID Value in next row Sensitive      SENSITIVE2Performed at [facility] [address], [place], [place] [address]    * METHICILLIN RESISTANT STAPHYLOCOCCUS AUREUS  CULTURE, BLOOD (ROUTINE X 2) w Reflex to ID Panel     Status: Abnormal   Collection Time: 05/13/19  2:20 PM   Specimen: BLOOD  Result Value Ref Range Status   Specimen Description   Final    BLOOD BLOOD RIGHT HAND Performed at [facility], [address] [place], [place] [address]    Special Requests   Final    BOTTLES DRAWN AEROBIC AND ANAEROBIC Blood Culture adequate volume Performed at [facility], [address] [place], [place] [address]    Culture  Setup Time   Final    GRAM POSITIVE COCCI ANAEROBIC BOTTLE ONLY CRITICAL RESULT CALLED TO, READ BACK BY AND VERIFIED  WITH: [facility] KATSOUDAS AT 5102 05/14/19 SDR Performed at [facility], [address] [place], [place] [address]    Culture (A)  Final    STAPHYLOCOCCUS AUREUS SUSCEPTIBILITIES PERFORMED ON PREVIOUS CULTURE WITHIN THE LAST 5 DAYS. Performed at [facility], [place] [address], [place], [place] [address]    Report Status 05/16/2019 FINAL  Final  CULTURE, BLOOD (ROUTINE X 2) w Reflex to ID Panel     Status: None   Collection Time: 05/13/19  3:39 PM   Specimen: BLOOD  Result Value Ref Range Status   Specimen Description BLOOD BLOOD LEFT HAND  Final   Special Requests   Final    BOTTLES DRAWN AEROBIC ONLY Blood Culture adequate volume   Culture   Final    NO GROWTH 5 DAYS Performed at [facility], [address] [place], [place] [address]    Report Status 05/18/2019 FINAL  Final  CULTURE, BLOOD (ROUTINE X 2) w Reflex to ID Panel     Status: Abnormal   Collection Time: 05/14/19  3:33 PM   Specimen: BLOOD  Result Value Ref Range Status   Specimen Description   Final    BLOOD BLOOD RIGHT HAND Performed at [facility], [address] [place], [place]  [address]    Special Requests   Final    BOTTLES DRAWN AEROBIC AND ANAEROBIC Blood Culture adequate volume Performed at [facility], [address] [place], [place] [address]    Culture  Setup Time   Final    GRAM POSITIVE COCCI IN CLUSTERS ANAEROBIC BOTTLE ONLY CRITICAL VALUE NOTED.  VALUE IS CONSISTENT WITH PREVIOUSLY REPORTED AND CALLED VALUE.    Culture (A)  Final    STAPHYLOCOCCUS AUREUS SEE SEPARATE REPORT IN [facility] FOR DOS 05/14/19. Performed at [facility], [place] [address] [place], [place] [address]    Report Status 05/24/2019 FINAL  Final  Cath Tip Culture     Status: None   Collection Time: 05/14/19  4:24 PM   Specimen: Catheter Tip; Other  Result Value Ref Range Status   Specimen Description   Final    CATH TIP Performed at [facility], [address] [place], [place] [address]    Special Requests   Final    NONE Performed at [facility], [address] [place], [place] [address]    Culture   Final    NO GROWTH 3 DAYS Performed at [facility], [place] [address], [place], [place] [address]    Report Status 05/17/2019 FINAL  Final  CULTURE, BLOOD (ROUTINE X 2) w Reflex to ID Panel     Status: Abnormal   Collection Time: 05/14/19  6:25 PM   Specimen: BLOOD  Result Value Ref Range Status   Specimen Description   Final    BLOOD BLOOD LEFT HAND Performed at [facility], [address] [place], [place] [address]    Special Requests   Final    BOTTLES DRAWN AEROBIC AND ANAEROBIC Blood Culture results may not be optimal due to an inadequate volume of blood received in culture bottles Performed at [facility], [address] [place], [place] [address]    Culture  Setup Time   Final    GRAM POSITIVE COCCI ANAEROBIC BOTTLE ONLY CRITICAL VALUE NOTED.  VALUE IS CONSISTENT WITH PREVIOUSLY REPORTED AND CALLED VALUE. [place] Performed at [facility], [address] [place], [place] [address]    Culture (A)  Final    STAPHYLOCOCCUS AUREUS SUSCEPTIBILITIES PERFORMED ON PREVIOUS CULTURE WITHIN THE LAST 5 DAYS. Performed at [facility], [address], [place], [place] [address]    Report Status 05/17/2019 FINAL  Final  Culture, blood (Routine X 2) w Reflex to ID Panel     Status: None   Collection Time: 05/16/19 10:45 AM   Specimen: BLOOD  Result Value Ref Range Status   Specimen Description BLOOD RIGHT HAND  Final   Special Requests BLOOD Blood Culture adequate volume  Final   Culture   Final    NO GROWTH 5 DAYS Performed at [facility], [address] [place], [place] [address]    Report Status 05/19/2019 FINAL  Final  Culture, blood (Routine X 2) w Reflex to ID Panel     Status: None   Collection Time: 05/16/19 12:38 PM   Specimen: BLOOD  Result Value Ref Range  Status   Specimen Description BLOOD RT HAND  Final   Special Requests   Final    BOTTLES DRAWN AEROBIC AND ANAEROBIC Blood Culture adequate volume   Culture   Final    NO GROWTH 5 DAYS Performed at [facility], [address] [place], [place] [address]    Report  Status 05/11/2019 FINAL  Final  Respiratory Panel by RT PCR (Flu A&B, Covid) - Nasopharyngeal Swab     Status: None   Collection Time: 05/09/2019 12:38 PM   Specimen: Nasopharyngeal Swab  Result Value Ref Range Status   SARS Coronavirus 2 by RT PCR NEGATIVE NEGATIVE Final    Comment: (NOTE) SARS-CoV-2 target nucleic acids are NOT DETECTED. The SARS-CoV-2 RNA is generally detectable in upper respiratoy specimens during the acute phase of infection. The lowest concentration of SARS-CoV-2 viral copies this assay can detect is 131 copies/mL. A negative result does not preclude SARS-Cov-2 infection and should not be used as the sole basis for treatment or other patient management decisions. A negative result may occur with  improper specimen collection/handling, submission of specimen other than nasopharyngeal swab, presence of viral mutation(s) within the areas targeted by this assay, and inadequate number of viral copies (<131 copies/mL). A negative result must be combined with clinical observations, patient history, and epidemiological information. The expected result is Negative. Fact Sheet for Patients:  PinkCheek.be Fact Sheet for Healthcare Providers:  GravelBags.it This test is not yet ap proved or cleared by the [place] FDA and  has been authorized for detection and/or diagnosis of SARS-CoV-2 by FDA under an Emergency Use Authorization (EUA). This EUA will remain  in effect (meaning this test can be used) for the duration of the COVID-19 declaration under Section 564(b)(1) of the Act, 21 U.S.C. section 360bbb-3(b)(1), unless the authorization is  terminated or revoked sooner.    Influenza A by PCR NEGATIVE NEGATIVE Final   Influenza B by PCR NEGATIVE NEGATIVE Final    Comment: (NOTE) The Xpert Xpress SARS-CoV-2/FLU/RSV assay is intended as an aid in  the diagnosis of influenza from Nasopharyngeal swab specimens and  should not be used as a sole basis for treatment. Nasal washings and  aspirates are unacceptable for Xpert Xpress SARS-CoV-2/FLU/RSV  testing. Fact Sheet for Patients: PinkCheek.be Fact Sheet for Healthcare Providers: GravelBags.it This test is not yet approved or cleared by the [place] FDA and  has been authorized for detection and/or diagnosis of SARS-CoV-2 by  FDA under an Emergency Use Authorization (EUA). This EUA will remain  in effect (meaning this test can be used) for the duration of the  Covid-19 declaration under Section 564(b)(1) of the Act, 21  U.S.C. section 360bbb-3(b)(1), unless the authorization is  terminated or revoked. Performed at [facility], [address] [place], [place] [address]     Coagulation Studies: No results for input(s): LABPROT, INR in the last 72 hours.  Urinalysis: No results for input(s): COLORURINE, LABSPEC, PHURINE, GLUCOSEU, HGBUR, BILIRUBINUR, KETONESUR, PROTEINUR, UROBILINOGEN, NITRITE, LEUKOCYTESUR in the last 72 hours.  Invalid input(s): APPERANCEUR    Imaging: PERIPHERAL VASCULAR CATHETERIZATION  Result Date: 05/23/2019 See Op Note  ECHO TEE  Result Date: 05/28/2019    TRANSESOPHOGEAL ECHO REPORT   Patient Name:   Crystal Vargas Date of Exam: 05/25/2019 Medical Rec #:  [id number]  Height:       62.0 in Accession #:    [id number] Weight:       155.2 lb Date of Birth:  12-01-1945  BSA:          1.716 m Patient Age:    20 years   BP:           176/66 mmHg Patient Gender: F          HR:           72 bpm.  Exam Location:  [facility] Procedure: Transesophageal Echo, Cardiac Doppler and Color Doppler  Indications:     Bacteremia 790.7  History:         Patient has prior history of Echocardiogram examinations, most                  recent 05/11/2019. CAD, Stroke; Risk Factors:Hypertension and                  Diabetes.  Sonographer:     Sherrie Sport RDCS (AE) Referring Phys:  [id number] Teodoro Spray Diagnosing Phys: Bartholome Bill MD PROCEDURE: The transesophogeal probe was passed without difficulty through the esophogus of the patient. Imaged were obtained with the patient in a left lateral decubitus position. Sedation performed by different physician. The patient was monitored while under deep sedation. Image quality was excellent. The patient developed no complications during the procedure. IMPRESSIONS  1. Left ventricular ejection fraction, by estimation, is 60 to 65%. The left ventricle has normal function. The left ventricle has no regional wall motion abnormalities.  2. Right ventricular systolic function is normal. The right ventricular size is normal.  3. No left atrial/left atrial appendage thrombus was detected.  4. The mitral valve is grossly normal. Trivial mitral valve regurgitation.  5. The aortic valve is tricuspid. Aortic valve regurgitation is trivial. FINDINGS  Left Ventricle: Left ventricular ejection fraction, by estimation, is 60 to 65%. The left ventricle has normal function. The left ventricle has no regional wall motion abnormalities. The left ventricular internal cavity size was normal in size. There is  no left ventricular hypertrophy. Right Ventricle: The right ventricular size is normal. No increase in right ventricular wall thickness. Right ventricular systolic function is normal. Left Atrium: Left atrial size was normal in size. No left atrial/left atrial appendage thrombus was detected. Right Atrium: Right atrial size was normal in size. Pericardium: There is no evidence of pericardial effusion. Mitral Valve: The mitral valve is grossly normal. Trivial mitral valve regurgitation. There is  no evidence of mitral valve vegetation. Tricuspid Valve: The tricuspid valve is grossly normal. Tricuspid valve regurgitation is trivial. There is no evidence of tricuspid valve vegetation. Aortic Valve: The aortic valve is tricuspid. Aortic valve regurgitation is trivial. There is no evidence of aortic valve vegetation. Pulmonic Valve: The pulmonic valve was not well visualized. Pulmonic valve regurgitation is trivial. Aorta: The aortic root is normal in size and structure. IAS/Shunts: The interatrial septum was not assessed. Bartholome Bill MD Electronically signed by Bartholome Bill MD Signature Date/Time: 05/14/2019/12:45:42 PM    Final      Medications:   . sodium chloride 20 mL (05/08/2019 1812)  . ceFTAROline (TEFLARO) IV 200 mg (05/11/2019 0320)  . clindamycin    . DAPTOmycin (CUBICIN)  IV 500 mg (05/22/2019 2354)   . allopurinol  100 mg Oral QPM  . alteplase  2 mg Intracatheter Once  . amLODipine  2.5 mg Oral Daily  . vitamin C  500 mg Oral BID  . brimonidine  1 drop Both Eyes Q12H   And  . timolol  1 drop Both Eyes Q12H  . carvedilol  25 mg Oral BID WC  . Chlorhexidine Gluconate Cloth  6 each Topical Daily  . epoetin (EPOGEN/PROCRIT) injection  4,000 Units Intravenous Q M,W,F-HD  . feeding supplement (NEPRO CARB STEADY)  237 mL Oral QID  . fentaNYL      . latanoprost  1 drop Both Eyes QPM  . levETIRAcetam  1,000 mg Oral QPM  .  megestrol  400 mg Oral BID  . midazolam      . midodrine  5 mg Oral Q M,W,F  . mirtazapine  7.5 mg Oral QHS  . multivitamin  1 tablet Oral QHS  . pantoprazole  40 mg Oral QHS  . sertraline  50 mg Oral Daily     so far Assessment/ Plan:  Ms. Hartley Wyke is a 74 y.o. black female with end stage renal disease on hemodialysis, CVA with left hemiparesis, hypertension, hyperlipidemia, GERD, glaucoma, diabetes mellitus type II, coronary artery disease, gout, seizure disorder who was [facility] to [facility] on 05/29/2019 for Hypokalemia [E87.6] Shock ([place]) [R57.9] ESRD (end  stage renal disease) ([place]) [N18.6] Hypotension, unspecified hypotension type [I95.9] Anemia, unspecified type [D64.9]  [facility] MWF  63.5kg  # End Stage Renal Disease:  Patient due for dialysis treatment today.  Continue dialysis on MWF schedule.  # Hypotension: with MRSA bacteremia/sepsis Persistent positive blood cultures from 4/7.   Neg results from 4/9 so far -Patient to be treated with ceftaroline and daptomycin per infectious disease.    #. Anemia with chronic kidney disease:  Lab Results  Component Value Date   HGB 10.1 (L) 05/14/2019  Hemoglobin currently at target.  Maintain the patient on Epogen 4000 units IV with dialysis.  # Secondary Hyperparathyroidism:  Off binders at present Lab Results  Component Value Date   PTH 232 (H) 05/11/2019   CALCIUM 8.2 (L) 05/09/2019   PHOS 2.2 (L) 06/04/2019  Serum phosphorus 2.2 at last check.  Not on binders at the moment.  #Malnutrition:   Currently on pureed diet albumin 2.0 on 05/18/2019 Eating less than 50% of her meals.  Overall prognosis remains quite guarded given underlying malnutrition.     LOS: 18 Nanie Dunkleberger 4/21/20213:26 PM

## 2019-05-28 NOTE — Op Note (Signed)
[facility]   OPERATIVE NOTE     PROCEDURE: 1. Insertion of a single-lumen Hickman tunneled catheter. 2. Catheter placement and cannulation under ultrasound and fluoroscopic guidance  PRE-OPERATIVE DIAGNOSIS: Sepsis requiring prolonged antibiotics  POST-OPERATIVE DIAGNOSIS: same as above  SURGEON: Hortencia Pilar  ANESTHESIA: Conscious sedation was administered under my direct supervision by the interventional radiology RN. IV Versed plus fentanyl were utilized. Continuous ECG, pulse oximetry and blood pressure was monitored throughout the entire procedure.  Conscious sedation was for a total of 30 minutes.  ESTIMATED BLOOD LOSS: Minimal  FINDING(S): 1.  Tip of the catheter in the right atrium on fluoroscopy 2.  No obvious pneumothorax on fluoroscopy  SPECIMEN(S):  none  INDICATIONS:   Crystal Vargas is a 74 y.o. female  presents with sepsis requiring parenteral antibiotic therapy.  Therefore, the patient requires a tunneled catheter placement.  The patient is informed of  the risks catheter placement include but are not limited to: bleeding, infection, central venous injury, pneumothorax, possible venous stenosis, possible malpositioning in the venous system, and possible infections related to long-term catheter presence.  The patient was aware of these risks and agreed to proceed.  DESCRIPTION: The patient was taken back to Special Procedure suite.  Prior to sedation, the patient was given IV antibiotics.  After obtaining adequate sedation, the patient was prepped and draped in the standard fashion for a single-lumen Hickman tunneled catheter placement.  Appropriate Time Out is called.     The right neck and chest wall are then infiltrated with 1% Lidocaine with epinepherine.  A single-lumen Hickman catheter is then selected, opened on the back table and prepped. Ultrasound is placed in a sterile sleeve.  Under ultrasound guidance, the right internal jugular  vein is examined and is noted to be echolucent and easily compressible indicating patency.   An image is recorded for the permanent record.  The right internal jugular vein is cannulated with the microneedle under direct ultrasound vissualization.  A Microwire followed by a micro sheath is then inserted without difficulty.   A J-wire was then advanced under fluoroscopic guidance into the inferior vena cava and the wire was secured.  Small counter incision was then made at the wire insertion site. A small pocket was fashioned with blunt dissection to allow easier passage of the cuff.  The dilator and peel-away sheath are then advanced over the wire under fluoroscopic guidance.   Small incision is made at the selected exit site and the tunneling device was passed subcutaneously to the counter incision. Catheter is then pulled through the subcutaneous tunnel. The catheter is then verified for tip position under fluoroscopy, transected and the hub assembly connected.  The catheter was tested by aspirating and flushing.  No resistance was noted.  Each port was then thoroughly flushed with heparinized saline.  The catheter was secured in placed with two interrupted stitches of 0 silk tied to the catheter.  The counter incision was closed with a U-stitch of 4-0 Monocryl.  The insertion site is then cleaned and sterile bandages applied including a Biopatch.  Each port was then packed with concentrated heparin (1000 Units/mL) at the manufacturer recommended volumes to each port.  Sterile caps were applied to each port.  On completion fluoroscopy, the tips of the catheter were in the right atrium, and there was no evidence of pneumothorax.  COMPLICATIONS: None  CONDITION: Unchanged   Hortencia Pilar [facility] Office: [phone]   05/16/2019, 12:36 PM

## 2019-05-28 NOTE — Progress Notes (Signed)
Treatment paused due to inability to maintain AP. MD made aware and order for cath flo to be placed for 45 minutes. Pt made aware. Awaiting Cath flo from pharmacy

## 2019-05-28 NOTE — Progress Notes (Signed)
Pt is alert and oriented. Awaiting cath flo withdrawal

## 2019-05-28 NOTE — Progress Notes (Signed)
Patient was brought to the room but then was immediately taken to dialysis. Dressing to new hickman was dry and intact

## 2019-05-28 NOTE — Consult Note (Addendum)
Pharmacy Antibiotic Note  Crystal Vargas is a 74 y.o. female admitted on 05/20/2019 with MRSA bacteremia due to an infected HD catheter.  Pharmacy has been consulted for daptomycin.  Patient receives hemodialysis on Monday, Wednesday and Friday. Dr Lucky Cowboy placed a PermCath 4/12. TEE is negative for vegetation. ID recommends completion of IV antibiotics through 06/20/19.   Plan: Daptomycin:   Continue daptomycin 500 mg (~8 mg/kg) IV q48 hours  CK once weekly: next 4/21 (range since admission: 10 - 166 U/L): atorvastatin has been held by attending physician due to uptrend in [place] will continue to dose and adjust per consult.   Height: 5\' 2"  (157.5 cm) Weight: 70.4 kg (155 lb 3.3 oz) IBW/kg (Calculated) : 50.1  Temp (24hrs), Avg:98.3 F (36.8 C), Min:97.7 F (36.5 C), Max:98.6 F (37 C)  Recent Labs  Lab 05/12/2019 0032 05/31/2019 0654  CREATININE 1.85* 2.54*    Estimated Creatinine Clearance: 17.3 mL/min (A) (by C-G formula based on SCr of 2.54 mg/dL (H)).    Antimicrobials this admission: vancomycin 4/3 >> 4/8 aztreonam 4/3 x 1 daptomycin 4/7 >> ceftaroline 4/12 >>  Microbiology results: 4/9 BCx: NG final 4/7 BCx: S aureus 4/7 Cath Tip: no growth final  4/6 Bcx: GPC 1/4 (anaerobic bottle) 4/4 Bcx:  Staph aureus (A-line) 4/5 Bcx:  Staph aureus 4/3 BCx: MRSA 4/4 Cath tip:  MRSA 4/3 UCx: Kleb - Likely contamination  4/3 MRSA PCR: negative 4/3 Flu A&B, Covid (nasopharyngeal swab):  negative  Thank you for allowing pharmacy to be a part of this patient's care.  Dallie Piles 05/14/2019 10:37 AM

## 2019-05-28 NOTE — Progress Notes (Signed)
PT Cancellation Note  Patient Details Name: Crystal Vargas MRN: [id number] DOB: [birth year]   Cancelled Treatment:    Reason Eval/Treat Not Completed: Patient at procedure or test/unavailable   Pt asleep this am with MD in room.  Returned later this am and she is at a procedure.  Will continue as appropriate.   Chesley Noon 05/13/2019, 12:29 PM

## 2019-05-28 NOTE — Progress Notes (Signed)
OT Cancellation Note  Patient Details Name: Crystal Vargas MRN: [id number] DOB: [birth year]   Cancelled Treatment:    Reason Eval/Treat Not Completed: Patient at procedure or test/ unavailable  Pt off floor at this time for insertion of a Hickman catheter with Dr. Fulton Mole. Will f/u for occupational therapy re-evaluation as able. Thank you.   Gerrianne Scale, [place], OTR/L ascom [phone] 05/31/2019, 11:36 AM

## 2019-05-28 NOTE — TOC Progression Note (Signed)
Transition of Care [facility]) - Progression Note    Patient Details  Name: Mertice Uffelman MRN: [id number] Date of Birth: [birth year]  [facility] CM/SW Contact  Beverly Sessions, RN Phone Number: 05/30/2019, 3:09 PM  Clinical Narrative:    Plan for PEG tube placement Ricky at [facility] updated    Expected Discharge Plan: [place] Barriers to Discharge: Continued Medical Work up  Expected Discharge Plan and Services Expected Discharge Plan: [place] arrangements for the past 2 months: [place] Expected Discharge Date: 05/23/2019                                     Social Determinants of Health (SDOH) Interventions    Readmission Risk Interventions No flowsheet data found.

## 2019-05-28 NOTE — Progress Notes (Signed)
HD treatment terminated early due to increased AP and inability to maintain BFR. Dr Holley Raring notified and order for treatment to be terminated and vascular will be consulted.

## 2019-05-29 ENCOUNTER — Encounter: Admission: EM | Disposition: E | Payer: Self-pay | Source: Home / Self Care | Attending: Internal Medicine

## 2019-05-29 ENCOUNTER — Encounter: Payer: Self-pay | Admitting: Internal Medicine

## 2019-05-29 ENCOUNTER — Inpatient Hospital Stay: Payer: Medicare Other | Admitting: Anesthesiology

## 2019-05-29 DIAGNOSIS — R7881 Bacteremia: Secondary | ICD-10-CM | POA: Diagnosis not present

## 2019-05-29 DIAGNOSIS — R1310 Dysphagia, unspecified: Secondary | ICD-10-CM | POA: Diagnosis not present

## 2019-05-29 DIAGNOSIS — B9562 Methicillin resistant Staphylococcus aureus infection as the cause of diseases classified elsewhere: Secondary | ICD-10-CM | POA: Diagnosis not present

## 2019-05-29 HISTORY — PX: PEG PLACEMENT: SHX5437

## 2019-05-29 SURGERY — INSERTION, PEG TUBE
Anesthesia: General

## 2019-05-29 MED ORDER — PROPOFOL 500 MG/50ML IV EMUL
INTRAVENOUS | Status: DC | PRN
  Administered 2019-05-29: 140 ug/kg/min via INTRAVENOUS
  Administered 2019-05-29: 30 mg via INTRAVENOUS

## 2019-05-29 MED ORDER — NEPRO/CARBSTEADY PO LIQD: 237.0000 mL | Freq: Three times a day (TID) | ORAL | Status: DC

## 2019-05-29 MED ORDER — NEPRO/CARBSTEADY PO LIQD: 1000.0000 mL | ORAL | Status: DC

## 2019-05-29 NOTE — OR Nursing (Signed)
PT TRANSFERRED BACK TO FLOOR. S/P PEG  #56F, PT WILL BE NPO PER PEG FOR 4HRS . THEN ONLY WATER VIA PEG TODAY. REPORT TO TIA ,RN. POST OP COURSE WNL

## 2019-05-29 NOTE — NC FL2 (Signed)
[place] LEVEL OF CARE SCREENING TOOL     IDENTIFICATION  Patient Name: Crystal Vargas Birthdate: 13-Dec-1945 Sex: female Admission Date (Current Location): 05/13/2019  [place] and [place] Number:  Crystal Vargas [id number] [place] and Address:  Crystal Vargas, [address], [place], [place] [address]      Provider Number: [id number]  Attending Physician Name and Address:  Fritzi Mandes, MD  Relative Name and Phone Number:  Crystal, [place] Daughter, [phone]    Current Level of Care: Hospital Recommended Level of Care: [place] Prior Approval Number:    Date Approved/Denied:   PASRR Number: [id number]  Discharge Plan: SNF    Current Diagnoses: Patient Active Problem List   Diagnosis Date Noted  . Pressure injury of skin 05/30/2019  . ESRD (end stage renal disease) (Crystal Vargas)   . Acute respiratory failure (Crystal Vargas)   . Hypokalemia   . MRSA bacteremia 05/11/2019  . Status post CVA 05/11/2019  . Schatzki's ring 05/11/2019  . Shock (Crystal Vargas) 05/30/2019  . Diabetes (Crystal Vargas) 04/15/2019  . Essential hypertension 04/15/2019  . ESRD on dialysis (Crystal Vargas) 04/15/2019    Orientation RESPIRATION BLADDER Height & Weight     Self, Situation  O2(2l [place]) (anuric) Weight: 70.4 kg Height:  5\' 2"  (157.5 cm)  BEHAVIORAL SYMPTOMS/MOOD NEUROLOGICAL BOWEL NUTRITION STATUS      Incontinent Diet, Feeding tube(NPO currently.  Peg tube being placed 4/22)  AMBULATORY STATUS COMMUNICATION OF NEEDS Skin   Total Care Verbally PU Stage and Appropriate Care, Other (Comment)(unstageable, deep tissue injury)                       Personal Care Assistance Level of Assistance  Bathing, Feeding, Dressing Bathing Assistance: Maximum assistance Feeding assistance: Maximum assistance Dressing Assistance: Maximum assistance     Functional Limitations Info  Sight, Hearing, Speech Sight Info: Adequate Hearing Info: Adequate Speech Info: Adequate    SPECIAL CARE  FACTORS FREQUENCY  OT (By licensed OT), PT (By licensed PT)     PT Frequency: 5x/wk OT Frequency: 5x/wk     Speech Therapy Frequency: 3x/wk for 2 weeks      Contractures Contractures Info: Not present    Additional Factors Info  Code Status, Insulin Sliding Scale(HD, IV antibiotics at discharge, tube feedings) Code Status Info: Full Allergies Info: Abacavir, Cephalosporins, Ciprofloxacin, Nsaids, Penicillin G, Quinolones, Salicylates   Insulin Sliding Scale Info: insulin aspart novolog 0-9 units every 4 hours       Current Medications (05/24/2019):  This is the current hospital active medication list Current Facility-Administered Medications  Medication Dose Route Frequency Provider Last Rate Last Admin  . 0.9 %  sodium chloride infusion   Intravenous PRN Schnier, Dolores Lory, MD 10 mL/hr at 05/08/2019 0334 250 mL at 05/31/2019 0334  . allopurinol (ZYLOPRIM) tablet 100 mg  100 mg Oral QPM Schnier, Dolores Lory, MD   100 mg at 05/12/2019 1829  . amLODipine (NORVASC) tablet 2.5 mg  2.5 mg Oral Daily Schnier, Dolores Lory, MD   2.5 mg at 06/06/2019 2254  . ascorbic acid (VITAMIN C) tablet 500 mg  500 mg Oral BID Delana Meyer Dolores Lory, MD   500 mg at 05/09/2019 2254  . brimonidine (ALPHAGAN) 0.2 % ophthalmic solution 1 drop  1 drop Both Eyes Q12H Schnier, Dolores Lory, MD   1 drop at 05/14/2019 2255   And  . timolol (TIMOPTIC) 0.5 % ophthalmic solution 1 drop  1 drop Both Eyes Q12H Schnier, Dolores Lory, MD  1 drop at 06/02/2019 2255  . carvedilol (COREG) tablet 25 mg  25 mg Oral BID WC Schnier, Dolores Lory, MD   25 mg at 05/14/2019 1829  . ceftaroline (TEFLARO) 200 mg in sodium chloride 0.9 % 250 mL IVPB  200 mg Intravenous Q8H Schnier, Dolores Lory, MD 250 mL/hr at 05/15/2019 0336 200 mg at 06/06/2019 0336  . Chlorhexidine Gluconate Cloth 2 % PADS 6 each  6 each Topical Daily Schnier, Dolores Lory, MD   6 each at 05/11/2019 [phone]  . DAPTOmycin (CUBICIN) 500 mg in sodium chloride 0.9 % IVPB  500 mg Intravenous Q48H Schnier, Dolores Lory, MD 220 mL/hr at 06/02/2019 2250 500 mg at 06/01/2019 2250  . epoetin alfa (EPOGEN) injection 4,000 Units  4,000 Units Intravenous Q M,W,F-HD Schnier, Dolores Lory, MD      . feeding supplement (NEPRO CARB STEADY) liquid 237 mL  237 mL Oral QID Crystal Cabal, MD   237 mL at 06/05/2019 1900  . HYDROmorphone (DILAUDID) injection 1 mg  1 mg Intravenous Once PRN Schnier, Dolores Lory, MD      . latanoprost (XALATAN) 0.005 % ophthalmic solution 1 drop  1 drop Both Eyes QPM Schnier, Dolores Lory, MD   1 drop at 05/30/2019 1842  . levETIRAcetam (KEPPRA) tablet 1,000 mg  1,000 mg Oral QPM Schnier, Dolores Lory, MD   1,000 mg at 05/12/2019 1829  . megestrol (MEGACE) 400 MG/10ML suspension 400 mg  400 mg Oral BID Delana Meyer Dolores Lory, MD   400 mg at 05/31/2019 2255  . midodrine (PROAMATINE) tablet 5 mg  5 mg Oral Q M,W,F Schnier, Dolores Lory, MD   5 mg at 05/12/2019 1405  . mirtazapine (REMERON) tablet 7.5 mg  7.5 mg Oral QHS Schnier, Dolores Lory, MD   7.5 mg at 06/06/2019 2254  . multivitamin (RENA-VIT) tablet 1 tablet  1 tablet Oral QHS Schnier, Dolores Lory, MD   1 tablet at 05/15/2019 2253  . ondansetron (ZOFRAN) injection 4 mg  4 mg Intravenous Q6H PRN Schnier, Dolores Lory, MD      . pantoprazole (PROTONIX) EC tablet 40 mg  40 mg Oral QHS Schnier, Dolores Lory, MD   40 mg at 05/09/2019 2254  . sertraline (ZOLOFT) tablet 50 mg  50 mg Oral Daily Schnier, Dolores Lory, MD   50 mg at 05/13/2019 4656     Discharge Medications: Please see discharge summary for a list of discharge medications.  Relevant Imaging Results:  Relevant Lab Results:   Additional Information SSN: [id number]  Crystal Sessions, RN

## 2019-05-29 NOTE — Op Note (Signed)
[facility] Patient Name: Crystal Vargas Procedure Date: 06/02/2019 1:30 PM MRN: [id number] Account #: [id number] Date of Birth: [birth year] Admit Type: Inpatient Age: 74 Room: [facility] ENDO ROOM 1 Gender: Female Note Status: Finalized Procedure:             Upper GI endoscopy Indications:           Dysphagia Providers:             Lucilla Lame MD, MD Medicines:             Propofol per Anesthesia Complications:         No immediate complications. Procedure:             Pre-Anesthesia Assessment:                        - Prior to the procedure, a History and Physical was                         performed, and patient medications and allergies were                         reviewed. The patient's tolerance of previous                         anesthesia was also reviewed. The risks and benefits                         of the procedure and the sedation options and risks                         were discussed with the patient. All questions were                         answered, and informed consent was obtained. Prior                         Anticoagulants: The patient has taken no previous                         anticoagulant or antiplatelet agents. ASA Grade                         Assessment: III - A patient with severe systemic                         disease. After reviewing the risks and benefits, the                         patient was deemed in satisfactory condition to                         undergo the procedure.                        After obtaining informed consent, the endoscope was                         passed under direct vision. Throughout the procedure,  the patient's blood pressure, pulse, and oxygen                         saturations were monitored continuously. The Endoscope                         was introduced through the mouth, and advanced to the                         second part of duodenum. The upper GI  endoscopy was                         accomplished without difficulty. The patient tolerated                         the procedure well. Findings:      The examined esophagus was normal.      The entire examined stomach was normal. Placement of an externally       removable PEG with no T-fasteners was successfully completed. The       external bumper was at the 4.0 cm marking on the tube.      The examined duodenum was normal. Impression:            - Normal esophagus.                        - Normal stomach.                        - Normal examined duodenum.                        - An externally removable PEG placement was                         successfully completed.                        - No specimens collected. Recommendation:        - Return patient to hospital ward for ongoing care.                        - Continue present medications.                        - Please follow the post-PEG recommendations                         including: Nutrition consult for formula and volume,                         advance food and medications per primary care                         provider, change dressing once per day and NPO x4 hrs                         then water today. Procedure Code(s):     --- Professional ---  43246, Esophagogastroduodenoscopy, flexible,                         transoral; with directed placement of percutaneous                         gastrostomy tube Diagnosis Code(s):     --- Professional ---                        R13.10, Dysphagia, unspecified CPT copyright 2019 American Medical Association. All rights reserved. The codes documented in this report are preliminary and upon coder review may  be revised to meet current compliance requirements. Lucilla Lame MD, MD 05/08/2019 2:19:32 PM This report has been signed electronically. Number of Addenda: 0 Note Initiated On: 05/26/2019 1:30 PM Estimated Blood Loss:  Estimated blood loss:  none.      [facility]

## 2019-05-29 NOTE — Evaluation (Signed)
Occupational Therapy Re-Evaluation Patient Details Name: Crystal Vargas MRN: [id number] DOB: 1945-02-17 Today's Date: 05/11/2019    History of Present Illness 74 year old who has been followed by [facility] nephrology and initiated dialysis in January.  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 Infected HD port; admitted with septic shock, MRSA bacteremia. Has had central line placed, plan for PEG tube placement 4/22 PM.   Clinical Impression   Pt seen for OT re-evaluation this date to assess current functional status/tolerance and performance with ADLs/ADL mobility and address goals. Pt with some increased visual attention to task this date which increased her effort/participation during session in general. But pt unfortunately was requiring increased assistance to tolerate static EOB sitting this date (MOD A +2 to MAX A +2). Pt requires MAX A +2 for lateral rolling in bed as well as to perform side-lying to sit transition, however, required essentially, TOTAL A +2 for sit to sup transition as pt was fatigued after ~6-8 minutes sitting. Pt requires MIN A at bed level to participate in oral care as well as tactile and verbal cues to attend to L side of mouth. Pt continues to demonstrate R visual gaze preference with potentially some L visual neglect. Overall, while pt's general participation/wakefullness were improved this date, she continues to demonstrate gross weakness and SNF remains most prudent d/c recommendation.     Follow Up Recommendations  SNF;Supervision/Assistance - 24 hour    Equipment Recommendations  Other (comment)(defer to next level of care)    Recommendations for Other Services       Precautions / Restrictions Precautions Precautions: Fall Restrictions Weight Bearing Restrictions: No Other Position/Activity Restrictions: Pt with Hx CVA, L arm/leg affected       Mobility Bed Mobility Overal bed mobility: Needs Assistance Bed Mobility: Rolling;Sidelying to Sit;Sit to Supine Rolling: Max assist;+2 for physical assistance Sidelying to sit: Max assist;+2 for physical assistance   Sit to supine: Total assist;+2 for physical assistance   General bed mobility comments: Pt makes some effort with verbal/tactile cues to use R UE to gab bed rail in prep for sidelying to sit. Requires 2p assist to manage UB/LB for sit to sup transition.  Transfers                 General transfer comment: deferred due to safety concerns    Balance Overall balance assessment: Needs assistance Sitting-balance support: Feet supported;Single extremity supported Sitting balance-Leahy Scale: Poor Sitting balance - Comments: Pt appears to wax and wane with effort to support self in static sitting this date between MOD A +2 to MAX A +2. Multimodal cueing and pillows under R UE for support utilized. Pt apeears to demo R lateral lean more significantly this date than on initial evaluation and requires more assistance to sustain sit. Postural control: Right lateral lean                                 ADL either performed or assessed with clinical judgement   ADL Overall ADL's : Needs assistance/impaired   Eating/Feeding Details (indicate cue type and reason): NPO at this time Grooming: Oral care;Bed level;Minimal assistance           Upper Body Dressing : Moderate assistance;Maximal assistance;Bed level Upper Body Dressing Details (indicate cue type and reason): based on clinical observation Lower Body Dressing: Maximal  assistance;Total assistance;Bed level Lower Body Dressing Details (indicate cue type and reason): based on clinical observation     Toileting- Clothing Manipulation and Hygiene: Total assistance Toileting - Clothing Manipulation Details (indicate cue type and reason): MAX to TOTAL A to roll in bed for peri care/clothing mgt              Vision   Additional Comments: Pt continues to appear to have L lateral neglect, gaze tends to veer to R. More visual attention demonstrated this session versus initial evaluation.     Perception     Praxis      Pertinent Vitals/Pain Pain Assessment: Faces Faces Pain Scale: Hurts a little bit Pain Location: occasionally complained of buttock pain with sitting/mobility Pain Descriptors / Indicators: Grimacing;Guarding;Moaning Pain Intervention(s): Limited activity within patient's tolerance;Monitored during session;Repositioned     Hand Dominance     Extremity/Trunk Assessment Upper Extremity Assessment Upper Extremity Assessment: Generalized weakness(R elbow MMT 3+/5, grip 3/5 (appears somewhat weaker versus initial evaluation) shld MMT 3-/5. No AROM of L UE d/t stroke.)   Lower Extremity Assessment Lower Extremity Assessment: Defer to PT evaluation;Generalized weakness       Communication Communication Communication: Expressive difficulties(voice soft/raspy/sometimes speech somewhat slurred/unclear.)   Cognition Arousal/Alertness: Awake/alert Behavior During Therapy: Flat affect Overall Cognitive Status: History of cognitive impairments - at baseline                                     General Comments       Exercises Other Exercises Other Exercises: OT faciliates pt participation in 5 reaches (shld flexion/elbow extension) with R UE, noting that pt appears to have some increased weakness versus initial evaluation as it is very effortful for pt to lift UE and clear elbow from bed level. Other Exercises: Pt tolerates EOB sitting with MOD +2 to MAX +2 x6-8 mins with PT/OT alternating seated support and engagement in therapeutic activity. OT attempts to engage pt in fxl use of R UE as would be needed to reasonbly complete 1 UB g/h task. Pt starts to fatigue requiring assist to return to bed from sitting.   Shoulder Instructions      Home Living  Family/patient expects to be discharged to:: Skilled nursing facility                                 Additional Comments: from [facility]      Prior Functioning/Environment Level of Independence: Needs assistance  Gait / Transfers Assistance Needed: Dtr, [name], indicates pt uses w/c for fxl mobility at baseline and requires assist to propel. Reports being able to participate in fxl SPS transfers to/from w/c. ADL's / Homemaking Assistance Needed: Pt reports she requires assistance for bathing/dressing/grooming at baseline and her daughter-Robin who is present throughout-corroborates. Pt does indicate being able to hold herself up during assistance with these ADLs.   Comments: previous notes indicate she was w/c bound        OT Problem List: Decreased strength;Decreased range of motion;Decreased activity tolerance;Impaired balance (sitting and/or standing);Decreased coordination;Decreased cognition;Decreased knowledge of use of DME or AE;Impaired sensation;Impaired tone;Impaired UE functional use;Pain      OT Treatment/Interventions: Self-care/ADL training;Therapeutic exercise;DME and/or AE instruction;Therapeutic activities;Patient/family education;Balance training    OT Goals(Current goals can be found in the care plan section) Acute Rehab OT Goals Patient Stated Goal: to have  pt overall feel better OT Goal Formulation: With patient/family Time For Goal Achievement: 06/12/19 Potential to Achieve Goals: Fair  OT Frequency: Min 2X/week   Barriers to D/C:            Co-evaluation PT/OT/SLP Co-Evaluation/Treatment: Yes Reason for Co-Treatment: Complexity of the patient's impairments (multi-system involvement);Necessary to address cognition/behavior during functional activity;For patient/therapist safety;To address functional/ADL transfers PT goals addressed during session: Mobility/safety with mobility;Balance;Strengthening/ROM OT goals addressed during  session: ADL's and self-care;Strengthening/ROM      AM-PAC OT "6 Clicks" Daily Activity     Outcome Measure Help from another person eating meals?: A Little(NPO, demos ROM necessary to complete self feeding with MIN A) Help from another person taking care of personal grooming?: A Little Help from another person toileting, which includes using toliet, bedpan, or urinal?: Total Help from another person bathing (including washing, rinsing, drying)?: Total Help from another person to put on and taking off regular upper body clothing?: A Lot Help from another person to put on and taking off regular lower body clothing?: Total 6 Click Score: 11   End of Session    Activity Tolerance: Patient limited by fatigue;Patient limited by lethargy Patient left: in bed;with call bell/phone within reach;with bed alarm set;with family/visitor present  OT Visit Diagnosis: Other abnormalities of gait and mobility (R26.89);Muscle weakness (generalized) (M62.81)                Time: 8264-1583 OT Time Calculation (min): 31 min Charges:  OT General Charges $OT Visit: 1 Visit OT Treatments $Self Care/Home Management : 8-22 mins  Gerrianne Scale, MS, OTR/L ascom [phone] 05/29/2019, 2:19 PM

## 2019-05-29 NOTE — Progress Notes (Signed)
[facility] [place] NAME: Crystal Vargas    MR#:  [id number]  DATE OF BIRTH:  08/19/1945  SUBJECTIVE:   Pt is awake and talkative today. Working with PT No new issues per RN  REVIEW OF SYSTEMS:   Review of Systems  Unable to perform ROS: Medical condition   Tolerating Diet:npo Tolerating PT: bedbound  DRUG ALLERGIES:   Allergies  Allergen Reactions  . Abacavir Other (See Comments)  . Cephalosporins Other (See Comments)    Tolerated ceftaroline April 2021  . Ciprofloxacin Other (See Comments)  . Nsaids Other (See Comments)  . Penicillin G Other (See Comments)  . Quinolones Other (See Comments)  . Salicylates Other (See Comments)    VITALS:  Blood pressure (!) 147/64, pulse (!) 56, temperature (!) 97.5 F (36.4 C), temperature source Oral, resp. rate 18, height 5\' 2"  (1.575 m), weight 70.4 kg, SpO2 100 %.  PHYSICAL EXAMINATION:   Physical Exam  GENERAL:  74 y.o.-year-old patient lying in the bed with no acute distress.  Chronically ill, deconditioned EYES: Pupils equal, round, reactive to light and accommodation. No scleral icterus.   HEENT: Head atraumatic, normocephalic. Oropharynx and nasopharynx clear.  NECK:  Supple, no jugular venous distention. No thyroid enlargement, no tenderness.  LUNGS: Normal breath sounds bilaterally, no wheezing, rales, rhonchi. No use of accessory muscles of respiration.  CARDIOVASCULAR: S1, S2 normal. No murmurs, rubs, or gallops.  ABDOMEN: Soft, nontender, nondistended. Bowel sounds present. No organomegaly or mass.  EXTREMITIES: No cyanosis, clubbing or edema b/l.    NEUROLOGIC: chronic left hemiparesis, muscle atrophy PSYCHIATRIC:  patient has flat affect SKIN:as below Pressure Injury 06/05/2019 Heel Left;Medial Unstageable - Full thickness tissue loss in which the base of the injury is covered by slough (yellow, tan, gray, green or brown) and/or eschar (tan, brown or black) in the wound bed.  unstagable with eshcar on le (Active)  05/31/2019 1830  Location: Heel  Location Orientation: Left;Medial  Staging: Unstageable - Full thickness tissue loss in which the base of the injury is covered by slough (yellow, tan, gray, green or brown) and/or eschar (tan, brown or black) in the wound bed.  Wound Description (Comments): unstagable with eshcar on left heel  Present on Admission: Yes     Pressure Injury 05/25/2019 Toe (Comment  which one) Left;Medial Deep Tissue Pressure Injury - Purple or maroon localized area of discolored intact skin or blood-filled blister due to damage of underlying soft tissue from pressure and/or shear. deep tissure (Active)  05/11/2019 1830  Location: Toe (Comment  which one) (left big toe)  Location Orientation: Left;Medial  Staging: Deep Tissue Pressure Injury - Purple or maroon localized area of discolored intact skin or blood-filled blister due to damage of underlying soft tissue from pressure and/or shear.  Wound Description (Comments): deep tissure injury on left big toe  Present on Admission: Yes     Pressure Injury 06/01/2019 Sacrum Medial Stage 2 -  Partial thickness loss of dermis presenting as a shallow open injury with a red, pink wound bed without slough. Pink (Active)  05/31/2019 1000  Location: Sacrum  Location Orientation: Medial  Staging: Stage 2 -  Partial thickness loss of dermis presenting as a shallow open injury with a red, pink wound bed without slough.  Wound Description (Comments): Pink  Present on Admission:       LABORATORY PANEL:  CBC No results for input(s): WBC, HGB, HCT, PLT in the last 168 hours.  Chemistries  Recent Labs  Lab 05/19/2019 0654  NA 141  K 3.5  CL 109  CO2 27  GLUCOSE 130*  BUN 17  CREATININE 2.54*  CALCIUM 8.2*   Cardiac Enzymes No results for input(s): TROPONINI in the last 168 hours. RADIOLOGY:  PERIPHERAL VASCULAR CATHETERIZATION  Result Date: 05/16/2019 See Op Note  ASSESSMENT AND PLAN:  Crystal Vargas is a 74 y.o.femalewho is bed bound and from SNF with history ofend-stage renal disease recently started on hemodialysis, history of right MCA stroke, history of dysphagia due to Schatzki's ring and is status post esophageal dilatation who was admitted with altered mental status.Her daughter noted poor PO intake thought to be due to dysphagia.   MRSA bacteremia/ septic shock -due to infected dialysis cath - appreciate ID assistance- currently on Daptomycin and Teflaro- awating approval for dapto -CK 16--166--CK on friday - repeat cultures 4/9 negative - TEE negative for vegetation -s/p  IV Hickman catheter placement by Dr schnier on 05/20/2019 for Teflaro for minimal 6 wks (last dose 06/20/2019)  Dysphagia- chronic issue - EGD performed 4/14 revealed and esophageal stenosis that was dilated  Poor oral intake  eating < 50 % of meals  - 4/17> continue to barely eat meals - 4/17>- added Megace- increased Nepro- started calorie count - 4/20> she is wanting a feeding tube-- PEG tube to be placed By Dr Allen Norris today   -We will need to follow for re-feeding syndrome afterwards-pt's dter Crystal Vargas is aware  Acute hypoxic respiratory failure - 4/15> on 3-4 L of O 2 with pulse ox in mid 90s - RR in high 20-low 30 range - fluid removed with dialysis on 4/16- no longer hypoxic    ESRD on HD - infected cath removed and tunnelled HD cath placed on 4/12 -cont dialysis per renal team - MWF dialysis with [facility]  HTN - meds being titrated by Renal  Acute anemia (possible related to infection/ acute illness) in addition to CKD - has been transfused 3 U PRBC so far - 4/3 (1 U) and 4/12 ( 2 U)   Diabetes Mellitus 2 with ESRD  - A1c checked on 4/3 was 5.6 and she was not on medications as outpt - CBG in hospital are not elevated- she is barely eating- d/c SSI and CBGs   Status post CVA - bed bound with left sided weakness (0/5) and left facial droop - hold Plavix for PEG- resume  after - cont Statin  Seizure disorder - cont Keppra  Mild thrombocytopenia - possible due to sepsis- follow  Depression - has a flat affect, poor appetite, barely eating but states she is not depressed - cont Remeron and Zoloft  DVT prophylaxis: SCDs Code Status: Full code Family Communication: speaking with daughter Crystal Vargas Aloi Disposition Plan: return to SNF eventually - She has now agreed to a PEG tube -plan for Thursday and then will watch for re-feeding syndrome for few days Consultants: Nephro,CArdiology,GI,ID,Palliative care Procedures:  4/14- EGDBenign-appearing esophageal stenosis 4/12- left IJ 2 D Crystal  Status is: Inpatient Dispo: The patient is from:rehab              Anticipated d/c is to her SNF              Anticipated d/c date is: likley Monday 4/26--after PEG placed need to monitor of refeeding syndrome for few days.  TOC aware for d/c date Pt's dter Robin aware of the plan  Patient currently best at baseline. Multiple co-morbidities. Long term prognosis poor  TOTAL TIME TAKING CARE OF THIS PATIENT: *25* minutes.  >50% time spent on counselling and coordination of care  Note: This dictation was prepared with Dragon dictation along with smaller phrase technology. Any transcriptional errors that result from this process are unintentional.  Fritzi Mandes M.D    [facility]   CC: Primary care physician; Marisa Hua, MDPatient ID: Crystal Vargas, female   DOB: 24-Jan-1946, 74 y.o.   MRN: [id number]

## 2019-05-29 NOTE — Transfer of Care (Signed)
Immediate Anesthesia Transfer of Care Note  Patient: Crystal Vargas  Procedure(s) Performed: PERCUTANEOUS ENDOSCOPIC GASTROSTOMY (PEG) PLACEMENT (N/A )  Patient Location: PACU  Anesthesia Type:General  Level of Consciousness: drowsy  Airway & Oxygen Therapy: Patient Spontanous Breathing and Patient connected to nasal cannula oxygen  Post-op Assessment: Report given to RN and Post -op Vital signs reviewed and stable  Post vital signs: Reviewed and stable  Last Vitals:  Vitals Value Taken Time  BP 96/54 06/06/2019 1428  Temp    Pulse 70 05/18/2019 1434  Resp 26 05/24/2019 1434  SpO2 100 % 05/15/2019 1434  Vitals shown include unvalidated device data.  Last Pain:  Vitals:   06/05/2019 1323  TempSrc: Oral  PainSc: 0-No pain      Patients Stated Pain Goal: 0 (67/54/49 2010)  Complications: No apparent anesthesia complications

## 2019-05-29 NOTE — Progress Notes (Signed)
I was asked to put a PEG tube in this patient who had been seen by Dr. Marius Ditch previously with an upper endoscopy with an esophageal stricture.  The patient is not having good p.o. nutrition.  I have spoken to the daughter extensively last night and explaining the feeding tube placement including the risks and benefits not limited to bleeding infection perforation and death.  The daughter reports that she understands and would like to proceed with the PEG tube placement.  The PEG tube placement will be done today.

## 2019-05-29 NOTE — Progress Notes (Signed)
Nutrition Follow Up Note   DOCUMENTATION CODES:   Not applicable  INTERVENTION:   Once G-tube ready for use:  Osmolite 1.5 @45ml /hr- Initiate at 50m/hr and increase by 165mhr q 12 hours until goal rate is reached.  Prostat liquid protein 30 daily via tube, each supplement provides 100 kcal, 15 grams protein.  Free water flushes 3086m4 hours to maintain tube patency   Regimen provides 1720kcal/day, 83g/day protein and 1003m103my.   Rena-vite daily   Vitamin C 500mg64mBID  Pt at high refeed risk; monitor K, Mg and P labs daily until stable.   NUTRITION DIAGNOSIS:   Inadequate oral intake related to poor appetite, dysphagia as evidenced by meal completion < 50%.  GOAL:   Patient will meet greater than or equal to 90% of their needs  -not met   MONITOR:   PO intake, Supplement acceptance, Weight trends, Labs, I & O's, Diet advancement, Skin  ASSESSMENT:   Patient with PMH significant for ESRD on HD, dysphagia due to Schatzki's ring with past dilations, CAD, DM, GERD, HLD, and HTN. Presents this admission with MRSA bacteremia.   Pt s/p placement of a 23 cm tip to cuff tunneled hemodialysis catheter 4/12  EGD performed 4/14 revealed esophageal stenosis that was dilated  Pt has decided to have PEG tube placed; plan is for GI PEG tube placement today. Will plan to initiate tube feeds 24 hours after tube placement. Pt is awake today and working with PT today.   Medications reviewed and include: allopurinol, vitamin C, epoetin, megace, remeron, rena-vite, protonix, ceftaroline, daptomycin   Labs reviewed: K 3.5 wnl, creat 2.54(H) P 2.2(L)- 4/19  Diet Order:   Diet Order            Diet NPO time specified  Diet effective now             EDUCATION NEEDS:   Not appropriate for education at this time  Skin:  Skin Assessment: Reviewed RN Assessment(L heel 3.5cm x 2.5cm x 0cm) Skin Integrity Issues:: Unstageable, Other (Comment) Unstageable: L heel Other: L  toe  Last BM:  4/22- type 6  Height:   Ht Readings from Last 1 Encounters:  05/09/2019 5' 2"  (1.575 m)    Weight:   Wt Readings from Last 1 Encounters:  05/14/2019 70.4 kg    BMI:  Body mass index is 28.39 kg/m.  Estimated Nutritional Needs:   Kcal:  1600-1800kcal/day  Protein:  80-90g/day  Fluid:  UOP +1L  CaseyKoleen DistanceRD, LDN Please refer to [facility]CRD and/or RD on-call/weekend/after hours pager

## 2019-05-29 NOTE — Progress Notes (Signed)
[facility]  ROUNDING NOTE   Subjective:  Patient due for PEG tube placement today. Had dialysis treatment yesterday.   Objective:  Vital signs in last 24 hours:  Temp:  [97.5 F (36.4 C)-98.2 F (36.8 C)] 98.2 F (36.8 C) (04/22 1323) Pulse Rate:  [53-65] 62 (04/22 1428) Resp:  [14-24] 22 (04/22 1428) BP: (96-177)/(54-103) 96/54 (04/22 1428) SpO2:  [92 %-100 %] 100 % (04/22 1428)  Weight change:  Filed Weights   05/24/19 0628 05/13/2019 0630 05/10/2019 0500  Weight: 64.7 kg 74.6 kg 70.4 kg    Intake/Output: I/O last 3 completed shifts: In: -  Out: 421 [Other:421]   Intake/Output this shift:  Total I/O In: 150 [I.V.:150] Out: 1 [Blood:1]  Physical Exam: General: Ill appearing  Head: Normocephalic, atraumatic.   Eyes: Anicteric  Neck: Supple  Lungs:  Clear bilateral, normal effort  Heart: Regular rhythm  Abdomen:  Soft, nontender   Extremities: No peripheral edema. Feet in soft supports  Neurologic: Awake, alert today.  Skin: No lesions  Access: Left IJ PermCath    Basic Metabolic Panel: Recent Labs  Lab 05/10/2019 1558 05/09/2019 0654  NA  --  141  K  --  3.5  CL  --  109  CO2  --  27  GLUCOSE  --  130*  BUN  --  17  CREATININE  --  2.54*  CALCIUM  --  8.2*  PHOS 2.2*  --     Liver Function Tests: No results for input(s): AST, ALT, ALKPHOS, BILITOT, PROT, ALBUMIN in the last 168 hours. No results for input(s): LIPASE, AMYLASE in the last 168 hours. No results for input(s): AMMONIA in the last 168 hours.  CBC: No results for input(s): WBC, NEUTROABS, HGB, HCT, MCV, PLT in the last 168 hours.  Cardiac Enzymes: Recent Labs  Lab 05/31/2019 0654  CKTOTAL 166    BNP: Invalid input(s): POCBNP  CBG: Recent Labs  Lab 05/25/19 0918 05/25/19 1130 05/25/19 1645 05/25/19 2113 06/02/2019 1132  GLUCAP 149* 155* 112* 178* 90    Microbiology: Results for orders placed or performed during the hospital encounter of 05/12/2019  Blood culture  (routine x 2)     Status: Abnormal   Collection Time: 05/09/2019 12:34 PM   Specimen: BLOOD  Result Value Ref Range Status   Specimen Description   Final    BLOOD R UP ARM Performed at [facility], [address], [place], [place] [address]    Special Requests   Final    BOTTLES DRAWN AEROBIC AND ANAEROBIC Blood Culture adequate volume Performed at [facility], [address] [place], [place] [address]    Culture  Setup Time   Final    Organism ID to follow IN [place] TO, READ BACK BY AND VERIFIED WITH: Gray ON 05/11/19 AT 0210 [facility] Performed at [facility], [address] [place], [place] [address]    Culture (A)  Final    STAPHYLOCOCCUS AUREUS SUSCEPTIBILITIES PERFORMED ON PREVIOUS CULTURE WITHIN THE LAST 5 DAYS. Performed at [facility], [place] [address], [place], [place] [address]    Report Status 05/13/2019 FINAL  Final  Blood Culture ID Panel (Reflexed)     Status: Abnormal   Collection Time: 05/12/2019 12:34 PM  Result Value Ref Range Status   Enterococcus species NOT DETECTED NOT DETECTED Final   Listeria monocytogenes NOT DETECTED NOT DETECTED Final   Staphylococcus  species DETECTED (A) NOT DETECTED Final    Comment: CRITICAL RESULT CALLED TO, READ BACK BY AND VERIFIED WITH: SCOTT HALL ON 05/11/19 AT 0210 [facility]    Staphylococcus aureus (BCID) DETECTED (A) NOT DETECTED Final    Comment: Methicillin (oxacillin)-resistant Staphylococcus aureus (MRSA). MRSA is predictably resistant to beta-lactam antibiotics (except ceftaroline). Preferred therapy is vancomycin unless clinically contraindicated. Patient requires contact precautions if  hospitalized. CRITICAL RESULT CALLED TO, READ BACK BY AND VERIFIED WITH: SCOTT HALL ON 05/11/19 AT 0210 [facility]    Methicillin resistance DETECTED (A) NOT DETECTED Final    Comment: CRITICAL RESULT CALLED TO, READ BACK BY AND VERIFIED  WITH: SCOTT HALL ON 05/11/19 AT 0210 [place]    Streptococcus species NOT DETECTED NOT DETECTED Final   Streptococcus agalactiae NOT DETECTED NOT DETECTED Final   Streptococcus pneumoniae NOT DETECTED NOT DETECTED Final   Streptococcus pyogenes NOT DETECTED NOT DETECTED Final   Acinetobacter baumannii NOT DETECTED NOT DETECTED Final   Enterobacteriaceae species NOT DETECTED NOT DETECTED Final   Enterobacter cloacae complex NOT DETECTED NOT DETECTED Final   Escherichia coli NOT DETECTED NOT DETECTED Final   Klebsiella oxytoca NOT DETECTED NOT DETECTED Final   Klebsiella pneumoniae NOT DETECTED NOT DETECTED Final   Proteus species NOT DETECTED NOT DETECTED Final   Serratia marcescens NOT DETECTED NOT DETECTED Final   Haemophilus influenzae NOT DETECTED NOT DETECTED Final   Neisseria meningitidis NOT DETECTED NOT DETECTED Final   Pseudomonas aeruginosa NOT DETECTED NOT DETECTED Final   Candida albicans NOT DETECTED NOT DETECTED Final   Candida glabrata NOT DETECTED NOT DETECTED Final   Candida krusei NOT DETECTED NOT DETECTED Final   Candida parapsilosis NOT DETECTED NOT DETECTED Final   Candida tropicalis NOT DETECTED NOT DETECTED Final    Comment: Performed at [facility], [address] [place], [place] [address]  Blood culture (routine x 2)     Status: Abnormal   Collection Time: 06/01/2019  1:24 PM   Specimen: BLOOD  Result Value Ref Range Status   Specimen Description   Final    BLOOD R LATREAL BICEP Performed at [facility], [address] [place], [place] [address]    Special Requests   Final    BOTTLES DRAWN AEROBIC AND ANAEROBIC Blood Culture adequate volume Performed at [facility], [address] [place], [place] [address]    Culture  Setup Time   Final    IN BOTH AEROBIC AND ANAEROBIC BOTTLES GRAM POSITIVE COCCI CRITICAL VALUE NOTED.  VALUE IS CONSISTENT WITH PREVIOUSLY REPORTED AND CALLED VALUE. Performed at [facility], [address] [place], [place] [address]    Culture METHICILLIN RESISTANT STAPHYLOCOCCUS AUREUS (A)  Final   Report Status 05/13/2019 FINAL  Final   Organism ID, Bacteria METHICILLIN RESISTANT STAPHYLOCOCCUS AUREUS  Final      Susceptibility   Methicillin resistant staphylococcus aureus - MIC*    CIPROFLOXACIN >=8 RESISTANT Resistant     ERYTHROMYCIN >=8 RESISTANT Resistant     GENTAMICIN <=0.5 SENSITIVE Sensitive     OXACILLIN >=4 RESISTANT Resistant     TETRACYCLINE <=1 SENSITIVE Sensitive     VANCOMYCIN <=0.5 SENSITIVE Sensitive     TRIMETH/SULFA >=320 RESISTANT Resistant     CLINDAMYCIN <=0.25 SENSITIVE Sensitive     RIFAMPIN <=0.5 SENSITIVE Sensitive     Inducible Clindamycin NEGATIVE Sensitive     * METHICILLIN RESISTANT STAPHYLOCOCCUS AUREUS  Respiratory Panel by RT PCR (Flu A&B, Covid) - Nasopharyngeal Swab  Status: None   Collection Time: 05/09/2019  1:24 PM   Specimen: Nasopharyngeal Swab  Result Value Ref Range Status   SARS Coronavirus 2 by RT PCR NEGATIVE NEGATIVE Final    Comment: (NOTE) SARS-CoV-2 target nucleic acids are NOT DETECTED. The SARS-CoV-2 RNA is generally detectable in upper respiratoy specimens during the acute phase of infection. The lowest concentration of SARS-CoV-2 viral copies this assay can detect is 131 copies/mL. A negative result does not preclude SARS-Cov-2 infection and should not be used as the sole basis for treatment or other patient management decisions. A negative result may occur with  improper specimen collection/handling, submission of specimen other than nasopharyngeal swab, presence of viral mutation(s) within the areas targeted by this assay, and inadequate number of viral copies (<131 copies/mL). A negative result must be combined with clinical observations, patient history, and epidemiological information. The expected result is Negative. Fact Sheet for Patients:  PinkCheek.be Fact Sheet for  Healthcare Providers:  GravelBags.it This test is not yet ap proved or cleared by the [place] FDA and  has been authorized for detection and/or diagnosis of SARS-CoV-2 by FDA under an Emergency Use Authorization (EUA). This EUA will remain  in effect (meaning this test can be used) for the duration of the COVID-19 declaration under Section 564(b)(1) of the Act, 21 U.S.C. section 360bbb-3(b)(1), unless the authorization is terminated or revoked sooner.    Influenza A by PCR NEGATIVE NEGATIVE Final   Influenza B by PCR NEGATIVE NEGATIVE Final    Comment: (NOTE) The Xpert Xpress SARS-CoV-2/FLU/RSV assay is intended as an aid in  the diagnosis of influenza from Nasopharyngeal swab specimens and  should not be used as a sole basis for treatment. Nasal washings and  aspirates are unacceptable for Xpert Xpress SARS-CoV-2/FLU/RSV  testing. Fact Sheet for Patients: PinkCheek.be Fact Sheet for Healthcare Providers: GravelBags.it This test is not yet approved or cleared by the [place] FDA and  has been authorized for detection and/or diagnosis of SARS-CoV-2 by  FDA under an Emergency Use Authorization (EUA). This EUA will remain  in effect (meaning this test can be used) for the duration of the  Covid-19 declaration under Section 564(b)(1) of the Act, 21  U.S.C. section 360bbb-3(b)(1), unless the authorization is  terminated or revoked. Performed at [facility], [address] [place], [place] [address]   MRSA PCR Screening     Status: None   Collection Time: 05/24/2019  6:27 PM   Specimen: Nasopharyngeal  Result Value Ref Range Status   MRSA by PCR NEGATIVE NEGATIVE Final    Comment:        The GeneXpert MRSA Assay (FDA approved for NASAL specimens only), is one component of a comprehensive MRSA colonization surveillance program. It is not intended to diagnose MRSA infection  nor to guide or monitor treatment for MRSA infections. Performed at [facility], [address], [place], [place] [address]   Urine culture     Status: Abnormal   Collection Time: 05/30/2019 10:30 PM   Specimen: Urine, Clean Catch  Result Value Ref Range Status   Specimen Description   Final    URINE, CLEAN CATCH Performed at [facility], [address] [place], [place] [address]    Special Requests   Final    NONE Performed at [facility], [address] [place], [place] [address]    Culture (A)  Final    >=100,000 COLONIES/mL KLEBSIELLA PNEUMONIAE >=100,000 COLONIES/mL VANCOMYCIN RESISTANT ENTEROCOCCUS  Report Status 05/14/2019 FINAL  Final   Organism ID, Bacteria KLEBSIELLA PNEUMONIAE (A)  Final   Organism ID, Bacteria VANCOMYCIN RESISTANT ENTEROCOCCUS (A)  Final      Susceptibility   Klebsiella pneumoniae - MIC*    AMPICILLIN >=32 RESISTANT Resistant     CEFAZOLIN <=4 SENSITIVE Sensitive     CEFTRIAXONE <=0.25 SENSITIVE Sensitive     CIPROFLOXACIN <=0.25 SENSITIVE Sensitive     GENTAMICIN <=1 SENSITIVE Sensitive     IMIPENEM 0.5 SENSITIVE Sensitive     NITROFURANTOIN 64 INTERMEDIATE Intermediate     TRIMETH/SULFA <=20 SENSITIVE Sensitive     AMPICILLIN/SULBACTAM 4 SENSITIVE Sensitive     PIP/TAZO <=4 SENSITIVE Sensitive     * >=100,000 COLONIES/mL KLEBSIELLA PNEUMONIAE   Vancomycin resistant enterococcus - MIC*    AMPICILLIN >=32 RESISTANT Resistant     NITROFURANTOIN 256 RESISTANT Resistant     VANCOMYCIN >=32 RESISTANT Resistant     LINEZOLID 2 SENSITIVE Sensitive     * >=100,000 COLONIES/mL VANCOMYCIN RESISTANT ENTEROCOCCUS  Cath Tip Culture     Status: Abnormal   Collection Time: 05/11/19  1:10 PM   Specimen: Catheter Tip; Other  Result Value Ref Range Status   Specimen Description   Final    CATH TIP Performed at [facility], [address] [place], [place] [address]    Special Requests   Final     NONE Performed at [facility], [address] [place], [place] [address]    Culture (A)  Final    >=100,000 COLONIES/mL METHICILLIN RESISTANT STAPHYLOCOCCUS AUREUS   Report Status 05/13/2019 FINAL  Final   Organism ID, Bacteria METHICILLIN RESISTANT STAPHYLOCOCCUS AUREUS (A)  Final      Susceptibility   Methicillin resistant staphylococcus aureus - MIC*    CIPROFLOXACIN >=8 RESISTANT Resistant     ERYTHROMYCIN >=8 RESISTANT Resistant     GENTAMICIN <=0.5 SENSITIVE Sensitive     OXACILLIN >=4 RESISTANT Resistant     TETRACYCLINE <=1 SENSITIVE Sensitive     VANCOMYCIN 1 SENSITIVE Sensitive     TRIMETH/SULFA >=320 RESISTANT Resistant     CLINDAMYCIN <=0.25 SENSITIVE Sensitive     RIFAMPIN <=0.5 SENSITIVE Sensitive     Inducible Clindamycin NEGATIVE Sensitive     * >=100,000 COLONIES/mL METHICILLIN RESISTANT STAPHYLOCOCCUS AUREUS  Culture, blood (routine x 2)     Status: Abnormal   Collection Time: 05/11/19  4:27 PM   Specimen: BLOOD  Result Value Ref Range Status   Specimen Description   Final    BLOOD A-LINE Performed at [facility], [address], [place], [place] [address]    Special Requests   Final    BOTTLES DRAWN AEROBIC AND ANAEROBIC Blood Culture adequate volume Performed at [facility], [address] [place], [place] [address]    Culture  Setup Time   Final    GRAM POSITIVE COCCI ANAEROBIC BOTTLE ONLY CRITICAL VALUE NOTED.  VALUE IS CONSISTENT WITH PREVIOUSLY REPORTED AND CALLED VALUE. Performed at [facility], [address] [place], [place] [address]    Culture (A)  Final    STAPHYLOCOCCUS AUREUS SUSCEPTIBILITIES PERFORMED ON PREVIOUS CULTURE WITHIN THE LAST 5 DAYS. Performed at [facility], [place] [address] [place], [place] [address]    Report Status 05/14/2019 FINAL  Final  Culture, blood (routine x 2)     Status: Abnormal   Collection Time: 05/12/19  6:06 AM   Specimen: BLOOD  Result Value Ref Range  Status  Specimen Description BLOOD BLOOD RIGHT HAND  Final   Special Requests   Final    BOTTLES DRAWN AEROBIC AND ANAEROBIC Blood Culture adequate volume   Culture  Setup Time   Final    GRAM POSITIVE COCCI ANAEROBIC BOTTLE ONLY CRITICAL RESULT CALLED TO, READ BACK BY AND VERIFIED WITH: DAVID BESANTI AT 10PM ON 05/12/2019 BY MOSLEY,J    Culture METHICILLIN RESISTANT STAPHYLOCOCCUS AUREUS (A)  Final   Report Status 05/11/2019 FINAL  Final   Organism ID, Bacteria METHICILLIN RESISTANT STAPHYLOCOCCUS AUREUS  Final      Susceptibility   Methicillin resistant staphylococcus aureus - MIC*    CIPROFLOXACIN >=8 RESISTANT Resistant     ERYTHROMYCIN >=8 RESISTANT Resistant     GENTAMICIN <=0.5 SENSITIVE Sensitive     OXACILLIN >=4 RESISTANT Resistant     TETRACYCLINE <=1 SENSITIVE Sensitive     VANCOMYCIN 1 SENSITIVE Sensitive     TRIMETH/SULFA >=320 RESISTANT Resistant     CLINDAMYCIN <=0.25 SENSITIVE Sensitive     RIFAMPIN <=0.5 SENSITIVE Sensitive     Inducible Clindamycin NEGATIVE Sensitive     LINEZOLID Value in next row Sensitive      SENSITIVE2Performed at [facility] [address], [place], [place] [address]    * METHICILLIN RESISTANT STAPHYLOCOCCUS AUREUS  CULTURE, BLOOD (ROUTINE X 2) w Reflex to ID Panel     Status: Abnormal   Collection Time: 05/13/19  2:20 PM   Specimen: BLOOD  Result Value Ref Range Status   Specimen Description   Final    BLOOD BLOOD RIGHT HAND Performed at [facility], [address] [place], [place] [address]    Special Requests   Final    BOTTLES DRAWN AEROBIC AND ANAEROBIC Blood Culture adequate volume Performed at [facility], [address], [place], [place] [address]    Culture  Setup Time   Final    GRAM POSITIVE COCCI ANAEROBIC BOTTLE ONLY CRITICAL RESULT CALLED TO, READ BACK BY AND VERIFIED WITH: [facility] KATSOUDAS AT 3151 05/14/19 SDR Performed at [facility], [address] [place], [place]  [address]    Culture (A)  Final    STAPHYLOCOCCUS AUREUS SUSCEPTIBILITIES PERFORMED ON PREVIOUS CULTURE WITHIN THE LAST 5 DAYS. Performed at [facility], [place] [address], [place], [place] [address]    Report Status 05/16/2019 FINAL  Final  CULTURE, BLOOD (ROUTINE X 2) w Reflex to ID Panel     Status: None   Collection Time: 05/13/19  3:39 PM   Specimen: BLOOD  Result Value Ref Range Status   Specimen Description BLOOD BLOOD LEFT HAND  Final   Special Requests   Final    BOTTLES DRAWN AEROBIC ONLY Blood Culture adequate volume   Culture   Final    NO GROWTH 5 DAYS Performed at [facility], [address] [place], [place] [address]    Report Status 05/18/2019 FINAL  Final  CULTURE, BLOOD (ROUTINE X 2) w Reflex to ID Panel     Status: Abnormal   Collection Time: 05/14/19  3:33 PM   Specimen: BLOOD  Result Value Ref Range Status   Specimen Description   Final    BLOOD BLOOD RIGHT HAND Performed at [facility], [address] [place], [place] [address]    Special Requests   Final    BOTTLES DRAWN AEROBIC AND ANAEROBIC Blood Culture adequate volume Performed at [facility], [address] [place], [place] [address]  Culture  Setup Time   Final    GRAM POSITIVE COCCI IN CLUSTERS ANAEROBIC BOTTLE ONLY CRITICAL VALUE NOTED.  VALUE IS CONSISTENT WITH PREVIOUSLY REPORTED AND CALLED VALUE.    Culture (A)  Final    STAPHYLOCOCCUS AUREUS SEE SEPARATE REPORT IN [facility] FOR DOS 05/14/19. Performed at [facility], [place] [address] [place], [place] [address]    Report Status 05/24/2019 FINAL  Final  Cath Tip Culture     Status: None   Collection Time: 05/14/19  4:24 PM   Specimen: Catheter Tip; Other  Result Value Ref Range Status   Specimen Description   Final    CATH TIP Performed at [facility], [address], [place], [place] [address]    Special Requests   Final    NONE Performed at [facility], [address], [place], [place] [address]    Culture   Final    NO GROWTH 3 DAYS Performed at [facility], [place] [address], [place], [place] [address]    Report Status 05/17/2019 FINAL  Final  CULTURE, BLOOD (ROUTINE X 2) w Reflex to ID Panel     Status: Abnormal   Collection Time: 05/14/19  6:25 PM   Specimen: BLOOD  Result Value Ref Range Status   Specimen Description   Final    BLOOD BLOOD LEFT HAND Performed at [facility], [address] [place], [place] [address]    Special Requests   Final    BOTTLES DRAWN AEROBIC AND ANAEROBIC Blood Culture results may not be optimal due to an inadequate volume of blood received in culture bottles Performed at [facility], [address], [place], [place] [address]    Culture  Setup Time   Final    GRAM POSITIVE COCCI ANAEROBIC BOTTLE ONLY CRITICAL VALUE NOTED.  VALUE IS CONSISTENT WITH PREVIOUSLY REPORTED AND CALLED VALUE. [place] Performed at [facility], [address] [place], [place] [address]    Culture (A)  Final    STAPHYLOCOCCUS AUREUS SUSCEPTIBILITIES PERFORMED ON PREVIOUS CULTURE WITHIN THE LAST 5 DAYS. Performed at [facility], [place] [address], [place], [place] [address]    Report Status 05/17/2019 FINAL  Final  Culture, blood (Routine X 2) w Reflex to ID Panel     Status: None   Collection Time: 05/16/19 10:45 AM   Specimen: BLOOD  Result Value Ref Range Status   Specimen Description BLOOD RIGHT HAND  Final   Special Requests BLOOD Blood Culture adequate volume  Final   Culture   Final    NO GROWTH 5 DAYS Performed at [facility], [address], [place], [place] [address]    Report Status 05/16/2019 FINAL  Final  Culture, blood (Routine X 2) w Reflex to ID Panel     Status: None   Collection Time: 05/16/19 12:38 PM   Specimen: BLOOD  Result Value Ref Range Status   Specimen Description BLOOD RT HAND  Final   Special Requests   Final    BOTTLES DRAWN AEROBIC AND ANAEROBIC  Blood Culture adequate volume   Culture   Final    NO GROWTH 5 DAYS Performed at [facility], [address], [place], [place] [address]    Report Status 05/11/2019 FINAL  Final  Respiratory Panel by RT PCR (Flu A&B, Covid) - Nasopharyngeal Swab     Status: None   Collection Time: 05/31/2019 12:38 PM   Specimen: Nasopharyngeal Swab  Result Value Ref Range Status   SARS Coronavirus 2 by RT PCR NEGATIVE NEGATIVE Final    Comment: (NOTE) SARS-CoV-2 target nucleic acids are NOT DETECTED. The SARS-CoV-2 RNA is generally detectable in upper respiratoy specimens during the acute phase of infection. The lowest concentration of SARS-CoV-2 viral copies this assay can detect is 131 copies/mL. A negative result does not preclude SARS-Cov-2 infection and should not be used as the sole basis for treatment or other patient management decisions. A negative result may occur with  improper specimen collection/handling, submission of specimen other than nasopharyngeal swab, presence of viral mutation(s) within the areas targeted by this assay, and inadequate number of viral copies (<131 copies/mL). A negative result must be combined with clinical observations, patient history, and epidemiological information. The expected result is Negative. Fact Sheet for Patients:  PinkCheek.be Fact Sheet for Healthcare Providers:  GravelBags.it This test is not yet ap proved or cleared by the [place] FDA and  has been authorized for detection and/or diagnosis of SARS-CoV-2 by FDA under an Emergency Use Authorization (EUA). This EUA will remain  in effect (meaning this test can be used) for the duration of the COVID-19 declaration under Section 564(b)(1) of the Act, 21 U.S.C. section 360bbb-3(b)(1), unless the authorization is terminated or revoked sooner.    Influenza A by PCR NEGATIVE NEGATIVE Final   Influenza B by PCR NEGATIVE NEGATIVE  Final    Comment: (NOTE) The Xpert Xpress SARS-CoV-2/FLU/RSV assay is intended as an aid in  the diagnosis of influenza from Nasopharyngeal swab specimens and  should not be used as a sole basis for treatment. Nasal washings and  aspirates are unacceptable for Xpert Xpress SARS-CoV-2/FLU/RSV  testing. Fact Sheet for Patients: PinkCheek.be Fact Sheet for Healthcare Providers: GravelBags.it This test is not yet approved or cleared by the [place] FDA and  has been authorized for detection and/or diagnosis of SARS-CoV-2 by  FDA under an Emergency Use Authorization (EUA). This EUA will remain  in effect (meaning this test can be used) for the duration of the  Covid-19 declaration under Section 564(b)(1) of the Act, 21  U.S.C. section 360bbb-3(b)(1), unless the authorization is  terminated or revoked. Performed at [facility], [address] [place],  [address]     Coagulation Studies: No results for input(s): LABPROT, INR in the last 72 hours.  Urinalysis: No results for input(s): COLORURINE, LABSPEC, PHURINE, GLUCOSEU, HGBUR, BILIRUBINUR, KETONESUR, PROTEINUR, UROBILINOGEN, NITRITE, LEUKOCYTESUR in the last 72 hours.  Invalid input(s): APPERANCEUR    Imaging: PERIPHERAL VASCULAR CATHETERIZATION  Result Date: 05/30/2019 See Op Note    Medications:   . [MAR Hold] sodium chloride 250 mL (05/22/2019 0334)  . [MAR Hold] ceFTAROline (TEFLARO) IV 200 mg (06/04/2019 0336)  . [MAR Hold] DAPTOmycin (CUBICIN)  IV 500 mg (05/08/2019 2250)   . [MAR Hold] allopurinol  100 mg Oral QPM  . [MAR Hold] amLODipine  2.5 mg Oral Daily  . [MAR Hold] vitamin C  500 mg Oral BID  . [MAR Hold] brimonidine  1 drop Both Eyes Q12H   And  . [MAR Hold] timolol  1 drop Both Eyes Q12H  . [MAR Hold] carvedilol  25 mg Oral BID WC  . [MAR Hold] Chlorhexidine Gluconate Cloth  6 each Topical Daily  . [MAR Hold] epoetin  (EPOGEN/PROCRIT) injection  4,000 Units Intravenous Q M,W,F-HD  . [MAR Hold] feeding supplement (NEPRO CARB STEADY)  237 mL Oral QID  . [MAR Hold] latanoprost  1 drop Both Eyes  QPM  . [MAR Hold] levETIRAcetam  1,000 mg Oral QPM  . [MAR Hold] megestrol  400 mg Oral BID  . [MAR Hold] midodrine  5 mg Oral Q M,W,F  . [MAR Hold] mirtazapine  7.5 mg Oral QHS  . [MAR Hold] multivitamin  1 tablet Oral QHS  . [MAR Hold] pantoprazole  40 mg Oral QHS  . [MAR Hold] sertraline  50 mg Oral Daily     so far Assessment/ Plan:  Crystal Vargas is a 74 y.o. black female with end stage renal disease on hemodialysis, CVA with left hemiparesis, hypertension, hyperlipidemia, GERD, glaucoma, diabetes mellitus type II, coronary artery disease, gout, seizure disorder who was admitted to [facility] on 05/26/2019 for Hypokalemia [E87.6] Shock ([place]) [R57.9] ESRD (end stage renal disease) ([place]) [N18.6] Hypotension, unspecified hypotension type [I95.9] Anemia, unspecified type [D64.9]  [facility] MWF  63.5kg  # End Stage Renal Disease:  Patient had dialysis treatment yesterday.  No acute indication for dialysis treatment today.  # Hypotension: with MRSA bacteremia/sepsis Persistent positive blood cultures from 4/7.   Neg results from 4/9 so far -Ceftaroline and daptomycin per ID.    #. Anemia with chronic kidney disease:  Lab Results  Component Value Date   HGB 10.1 (L) 05/19/2019  Hemoglobin currently at target.  Maintain the patient on Epogen 4000 units IV with dialysis.  # Secondary Hyperparathyroidism:  Off binders at present Lab Results  Component Value Date   PTH 232 (H) 05/11/2019   CALCIUM 8.2 (L) 05/16/2019   PHOS 2.2 (L) 05/17/2019  Recheck serum phosphorus tomorrow. #Malnutrition:   Currently on pureed diet albumin 2.0 on 05/18/2019 Eating less than 50% of her meals.  Overall prognosis remains quite guarded given underlying malnutrition. PEG tube today.     LOS: 19 Crystal Vargas 4/22/20212:38 PM

## 2019-05-29 NOTE — Progress Notes (Signed)
Physical Therapy Treatment Patient Details Name: Crystal Vargas MRN: [id number] DOB: [birth year] Today's Date: 06/01/2019    History of Present Illness 74 year old who has been followed by [facility] nephrology and initiated dialysis in January.  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 Infected HD port; admitted with septic shock, MRSA bacteremia. Has had central line placed, plan for PEG tube placement 4/22 PM.    PT Comments    Patient much more alert this AM,much more vocal, but did exhibit throughout session fatigue and began to keep eyes closed towards the end. The patient was able to perform some UE and LE movement/exercises with varying level of assistance (AROM-AAROM of R side, PROM of L side).  Rolling and sidelying to sit maxAx2. Pt sat EOB for 6-8 minutes with modAx2 - maxAx2. Cueing throughout to maximize pt participation, able to lift head to look up at PT, and improved ability to attend to L side compared to previous session. Pt assisted with LE support and RUE propping, significant R lean noted this session. Pt returned to supine totalx2, repositioned with all needs in reach. The patient would benefit from further skilled PT intervention to continue to progress towards goals. Recommendation remains appropriate.     Follow Up Recommendations  SNF;Supervision/Assistance - 24 hour     Equipment Recommendations  None recommended by PT    Recommendations for Other Services       Precautions / Restrictions Precautions Precautions: Fall Restrictions Weight Bearing Restrictions: No LUE Weight Bearing: Non weight bearing LLE Weight Bearing: Non weight bearing    Mobility  Bed Mobility Overal bed mobility: Needs Assistance Bed Mobility: Sit to Supine;Rolling;Sidelying to Sit Rolling: Max assist;+2 for physical assistance Sidelying to sit: Max assist;+2 for physical  assistance   Sit to supine: Total assist;+2 for physical assistance   General bed mobility comments: Pt able to reach towards R rail with R hand, move RLE towards EOB. total assist to return to supine with 2 person assist.  Transfers                 General transfer comment: deferred due to safety concerns  Ambulation/Gait                 Stairs             Wheelchair Mobility    Modified Rankin (Stroke Patients Only)       Balance Overall balance assessment: Needs assistance Sitting-balance support: Feet supported;Single extremity supported Sitting balance-Leahy Scale: Poor Sitting balance - Comments: variable, with propping, LE support, and multimodal cueing, pt flucuated between modAx2-maxAx2 to maintain sitting balance. Significant R lean noted this session. Postural control: Right lateral lean                                  Cognition Arousal/Alertness: Awake/alert Behavior During Therapy: Flat affect Overall Cognitive Status: History of cognitive impairments - at baseline                                        Exercises Other Exercises Other Exercises: Pt able to perform x5 heel slides of RLE with tactile cueing, several ankle pumps, x5 hip abduction AAROM. PT also AAROM for R shoulder flexion x5, and  elbow flexion/extension x5 Other Exercises: Patient sat EOB 6-8 minutes with modAx2 - maxAx2. Cueing throughout to maximize pt participation, able to lift head to look up at PT, and improved ability to attend to L side compared to previous session.    General Comments        Pertinent Vitals/Pain Pain Assessment: Faces Faces Pain Scale: Hurts a little bit Pain Location: occasionally complained of buttock pain with sitting/mobility Pain Descriptors / Indicators: Grimacing;Guarding;Moaning Pain Intervention(s): Limited activity within patient's tolerance;Monitored during session;Repositioned    Home Living                       Prior Function            PT Goals (current goals can now be found in the care plan section) Progress towards PT goals: Progressing toward goals    Frequency    Min 2X/week      PT Plan Current plan remains appropriate    Co-evaluation PT/OT/SLP Co-Evaluation/Treatment: Yes Reason for Co-Treatment: Complexity of the patient's impairments (multi-system involvement);For patient/therapist safety;To address functional/ADL transfers;Necessary to address cognition/behavior during functional activity PT goals addressed during session: Mobility/safety with mobility;Balance;Strengthening/ROM OT goals addressed during session: ADL's and self-care;Strengthening/ROM      AM-PAC PT "6 Clicks" Mobility   Outcome Measure  Help needed turning from your back to your side while in a flat bed without using bedrails?: Total Help needed moving from lying on your back to sitting on the side of a flat bed without using bedrails?: Total Help needed moving to and from a bed to a chair (including a wheelchair)?: Total Help needed standing up from a chair using your arms (e.g., wheelchair or bedside chair)?: Total Help needed to walk in hospital room?: Total Help needed climbing 3-5 steps with a railing? : Total 6 Click Score: 6    End of Session   Activity Tolerance: Patient limited by fatigue Patient left: with bed alarm set;with call bell/phone within reach;in bed Nurse Communication: Mobility status PT Visit Diagnosis: Muscle weakness (generalized) (M62.81);Other abnormalities of gait and mobility (R26.89)     Time: 0900-0930 PT Time Calculation (min) (ACUTE ONLY): 30 min  Charges:  $Therapeutic Exercise: 8-22 mins                     Lieutenant Diego PT, DPT 11:23 AM,05/24/2019

## 2019-05-29 NOTE — Anesthesia Preprocedure Evaluation (Signed)
Anesthesia Evaluation  Patient identified by MRN, date of birth, ID band Patient confused    Reviewed: Allergy & Precautions, H&P , NPO status , Patient's Chart, lab work & pertinent test results  History of Anesthesia Complications Negative for: history of anesthetic complications  Airway Mallampati: II       Dental  (+) Poor Dentition   Pulmonary neg sleep apnea, neg COPD, Not current smoker,           Cardiovascular Exercise Tolerance: Poor hypertension, On Medications + CAD  (-) CHF negative cardio ROS Normal cardiovascular exam(-) dysrhythmias (-) Valvular Problems/Murmurs     Neuro/Psych neg Seizures Depression CVA, Residual Symptoms    GI/Hepatic Neg liver ROS, GERD  Medicated,  Endo/Other  diabetes, Type 2, Oral Hypoglycemic Agents  Renal/GU Renal disease  negative genitourinary   Musculoskeletal   Abdominal   Peds  Hematology   Anesthesia Other Findings   Reproductive/Obstetrics                             Anesthesia Physical  Anesthesia Plan  ASA: III  Anesthesia Plan: General   Post-op Pain Management:    Induction:   PONV Risk Score and Plan: 3 and Propofol infusion, TIVA and Treatment may vary due to age or medical condition  Airway Management Planned: Nasal Cannula  Additional Equipment:   Intra-op Plan:   Post-operative Plan:   Informed Consent: I have reviewed the patients History and Physical, chart, labs and discussed the procedure including the risks, benefits and alternatives for the proposed anesthesia with the patient or authorized representative who has indicated his/her understanding and acceptance.       Plan Discussed with:   Anesthesia Plan Comments:         Anesthesia Quick Evaluation

## 2019-05-30 ENCOUNTER — Encounter: Payer: Self-pay | Admitting: *Deleted

## 2019-05-30 DIAGNOSIS — R7881 Bacteremia: Secondary | ICD-10-CM | POA: Diagnosis not present

## 2019-05-30 DIAGNOSIS — B9562 Methicillin resistant Staphylococcus aureus infection as the cause of diseases classified elsewhere: Secondary | ICD-10-CM | POA: Diagnosis not present

## 2019-05-30 LAB — CK: Total CK: 124 U/L (ref 38–234)

## 2019-05-30 LAB — PHOSPHORUS: Phosphorus: 1.8 mg/dL — ABNORMAL LOW (ref 2.5–4.6)

## 2019-05-30 LAB — MAGNESIUM: Magnesium: 1.5 mg/dL — ABNORMAL LOW (ref 1.7–2.4)

## 2019-05-30 MED ORDER — PRO-STAT SUGAR FREE PO LIQD
30.0000 mL | Freq: Every day | ORAL | Status: DC
  Administered 2019-05-31 – 2019-06-03 (×4): 30 mL

## 2019-05-30 MED ORDER — SODIUM PHOSPHATES 45 MMOLE/15ML IV SOLN
30.0000 mmol | Freq: Once | INTRAVENOUS | Status: AC
  Administered 2019-05-30: 18:00:00 30 mmol via INTRAVENOUS
  Filled 2019-05-30: qty 10

## 2019-05-30 MED ORDER — FREE WATER
30.0000 mL | Status: DC
  Administered 2019-05-30 – 2019-06-03 (×22): 30 mL

## 2019-05-30 MED ORDER — B COMPLEX-C PO TABS
1.0000 | ORAL_TABLET | Freq: Every day | ORAL | Status: DC
  Administered 2019-05-30 – 2019-06-02 (×4): 1
  Filled 2019-05-30 (×5): qty 1

## 2019-05-30 MED ORDER — POTASSIUM PHOSPHATES 15 MMOLE/5ML IV SOLN
10.0000 mmol | Freq: Once | INTRAVENOUS | Status: DC
  Filled 2019-05-30: qty 3.33

## 2019-05-30 MED ORDER — FREE WATER
30.0000 mL | Status: DC
  Administered 2019-05-30: 30 mL

## 2019-05-30 MED ORDER — OSMOLITE 1.5 CAL PO LIQD: 1000.0000 mL | ORAL | Status: DC

## 2019-05-30 MED ORDER — ASCORBIC ACID 500 MG PO TABS
500.0000 mg | ORAL_TABLET | Freq: Two times a day (BID) | ORAL | Status: DC
  Administered 2019-05-30 – 2019-06-03 (×8): 500 mg
  Filled 2019-05-30 (×8): qty 1

## 2019-05-30 MED ORDER — FREE WATER: 30.0000 mL | Status: DC

## 2019-05-30 MED ORDER — CLOPIDOGREL BISULFATE 75 MG PO TABS
75.0000 mg | ORAL_TABLET | Freq: Every evening | ORAL | Status: DC
  Administered 2019-05-31 – 2019-06-02 (×3): 75 mg via ORAL
  Filled 2019-05-30 (×3): qty 1

## 2019-05-30 MED ORDER — OSMOLITE 1.5 CAL PO LIQD
1000.0000 mL | ORAL | Status: DC
  Administered 2019-05-30: 1000 mL

## 2019-05-30 MED ORDER — MAGNESIUM SULFATE 2 GM/50ML IV SOLN
2.0000 g | Freq: Once | INTRAVENOUS | Status: AC
  Administered 2019-05-30: 2 g via INTRAVENOUS
  Filled 2019-05-30: qty 50

## 2019-05-30 NOTE — Progress Notes (Signed)
Abdominal binder placed to protect peg site. Patient continues to pull at bandage.

## 2019-05-30 NOTE — TOC Progression Note (Signed)
Transition of Care [facility]) - Progression Note    Patient Details  Name: Crystal Vargas MRN: [id number] Date of Birth: 26-Mar-1945  [facility]) CM/SW Contact  Beverly Sessions, RN Phone Number: 05/30/2019, 10:31 AM  Clinical Narrative:     Daughter requested to see if there were any other facilities that would be able to accept her mother at discharge.  Her First Choice would be [facility] in [place] . RNCM sent bed search out to other local facilities, and spoke with Kieth Brightly at [facility] directly and they are able to offer a bed and transition the patient to long term care.   RNCM called daughter [name] to present bed offers.  After discussing the options with her family they have decided for patient to return to [facility] at discharge.    I have called and spoken with Audry Pili at and confirmed plan for anticipated discharge on Monday.   Expected Discharge Plan: [place] Barriers to Discharge: Continued Medical Work up  Expected Discharge Plan and Services Expected Discharge Plan: [place] arrangements for the past 2 months: [place] Expected Discharge Date: 06/02/2019                                     Social Determinants of Health (SDOH) Interventions    Readmission Risk Interventions No flowsheet data found.

## 2019-05-30 NOTE — Progress Notes (Signed)
PT Cancellation Note  Patient Details Name: Crystal Vargas MRN: [id number] DOB: 1945/11/08   Cancelled Treatment:     PT attempt. Pt very lethargic and unsafe to participate in PT at this time. Did respond to therapist but did not open eyes. Rn in room. Acute PT will continue to follow per POC.    Willette Pa 05/30/2019, 2:58 PM

## 2019-05-30 NOTE — Progress Notes (Signed)
Pt tolerated water via peg well. Orders given to start trickle feeds (Nepro Carb Steady 1012mL) at 30mL continuously per Sharion Settler, NP. Notified oncoming day shift nurse as well.

## 2019-05-30 NOTE — Consult Note (Signed)
PHARMACY CONSULT NOTE - FOLLOW UP  Pharmacy Consult for Electrolyte Monitoring and Replacement   Recent Labs: Potassium (mmol/L)  Date Value  05/30/2019 3.5   Magnesium (mg/dL)  Date Value  05/30/2019 1.5 (L)   Calcium (mg/dL)  Date Value  06/04/2019 8.2 (L)   Albumin (g/dL)  Date Value  05/17/2019 2.0 (L)   Phosphorus (mg/dL)  Date Value  05/30/2019 1.8 (L)   Sodium (mmol/L)  Date Value  06/04/2019 141     Assessment: 74 y.o.femalewho is bed bound and from SNF with history ofend-stage renal disease recently started on hemodialysis, history of right MCA stroke, history of dysphagia due to Schatzki's ring and is status post esophageal dilatation who was admitted with altered mental status. Risk for refeeding.   Goal of Therapy:  WNL  Plan:  Will replace with Mg 2 g IV x 1 and potassium phosphate 10 mmol IV x 1. Follow up with electrolytes in with AM labs.   Oswald Hillock ,PharmD, BCPS Clinical Pharmacist 05/30/2019 2:11 PM

## 2019-05-30 NOTE — Consult Note (Signed)
Pharmacy Antibiotic Note  Crystal Vargas is a 74 y.o. female admitted on 05/12/2019 with MRSA bacteremia due to an infected HD catheter.  Pharmacy has been consulted for daptomycin.  Patient receives hemodialysis on Monday, Wednesday and Friday. Dr Lucky Cowboy placed a PermCath 4/12. TEE is negative for vegetation. ID recommends completion of IV antibiotics through 06/20/19.   Plan: Daptomycin:   Continue daptomycin 500 mg (~8 mg/kg) IV q48 hours  CK once weekly: next 4/28 (range since admission: 10 - 166 U/L): atorvastatin has been held by attending physician due to uptrend in CK  Ceftaroline   Continue ceftaroline 200 mg q8H for MRSA bacteremia dosing in HD patients.   End date 5/14 for both antibiotics per ID.   Pharmacy will continue to dose and adjust per consult.   Height: 5\' 2"  (157.5 cm) Weight: 70.4 kg (155 lb 3.3 oz) IBW/kg (Calculated) : 50.1  Temp (24hrs), Avg:97.7 F (36.5 C), Min:97.2 F (36.2 C), Max:98.2 F (36.8 C)  Recent Labs  Lab 06/06/2019 0654  CREATININE 2.54*    Estimated Creatinine Clearance: 17.3 mL/min (A) (by C-G formula based on SCr of 2.54 mg/dL (H)).    Antimicrobials this admission: vancomycin 4/3 >> 4/8 aztreonam 4/3 x 1 daptomycin 4/7 >> ceftaroline 4/12 >>  Microbiology results: 4/9 BCx: NG final 4/7 BCx: S aureus 4/7 Cath Tip: no growth final  4/6 Bcx: GPC 1/4 (anaerobic bottle) 4/4 Bcx:  Staph aureus (A-line) 4/5 Bcx:  Staph aureus 4/3 BCx: MRSA 4/4 Cath tip:  MRSA 4/3 UCx: Kleb - Likely contamination  4/3 MRSA PCR: negative 4/3 Flu A&B, Covid (nasopharyngeal swab):  negative  Thank you for allowing pharmacy to be a part of this patient's care.  Oswald Hillock 05/30/2019 9:46 AM

## 2019-05-30 NOTE — Progress Notes (Signed)
Patient post peg placement.  Recovery without problems. Tolerated water via PEG. Orders to  Start trickle feeds not yet initiated.

## 2019-05-30 NOTE — Progress Notes (Signed)
Patient is pulling at peg tube and dressing. Dressing reinforced.

## 2019-05-30 NOTE — Anesthesia Postprocedure Evaluation (Signed)
Anesthesia Post Note  Patient: Crystal Vargas  Procedure(s) Performed: TRANSESOPHAGEAL ECHOCARDIOGRAM (TEE) (N/A )  Patient location during evaluation: PACU Anesthesia Type: General Level of consciousness: awake and alert Pain management: pain level controlled Vital Signs Assessment: post-procedure vital signs reviewed and stable Respiratory status: spontaneous breathing, nonlabored ventilation, respiratory function stable and patient connected to nasal cannula oxygen Cardiovascular status: blood pressure returned to baseline and stable Postop Assessment: no apparent nausea or vomiting Anesthetic complications: no     Last Vitals:  Vitals:   05/13/2019 2114 05/30/19 0357  BP: (!) 153/59 (!) 153/66  Pulse: 73 78  Resp: 20 18  Temp: (!) 36.3 C 36.8 C  SpO2: 100% 93%    Last Pain:  Vitals:   05/30/19 0357  TempSrc: Oral  PainSc:                  Molli Barrows

## 2019-05-30 NOTE — Progress Notes (Signed)
PT Cancellation Note  Patient Details Name: Crystal Vargas MRN: [id number] DOB: [birth year]   Cancelled Treatment:   PT attempt, Pt is currently off floor. Acute PT will continue to follow and return at later time/date as able per POC.     Willette Pa 05/30/2019, 10:43 AM

## 2019-05-30 NOTE — Progress Notes (Signed)
PEG site looks good.  Patent without complaints. Proceed with tube feedings.  I will sign off.  Please call if any further GI concerns or questions.  We would like to thank you for the opportunity to participate in the care of Crystal Vargas.

## 2019-05-30 NOTE — Progress Notes (Signed)
[place] at [place] NAME: Crystal Vargas    MR#:  [id number]  DATE OF BIRTH:  [birth year]  SUBJECTIVE:   Pt is seen at dialysis No new issues per RN TF to be started today  REVIEW OF SYSTEMS:   Review of Systems  Unable to perform ROS: Medical condition   Tolerating Diet: pt tolerates po meds Tolerating PT: bedbound  DRUG ALLERGIES:   Allergies  Allergen Reactions  . Abacavir Other (See Comments)  . Cephalosporins Other (See Comments)    Tolerated ceftaroline April 2021  . Ciprofloxacin Other (See Comments)  . Nsaids Other (See Comments)  . Penicillin G Other (See Comments)  . Quinolones Other (See Comments)  . Salicylates Other (See Comments)    VITALS:  Blood pressure (!) 153/66, pulse 78, temperature 98.2 F (36.8 C), temperature source Oral, resp. rate 18, height 5\' 2"  (1.575 m), weight 70.4 kg, SpO2 93 %.  PHYSICAL EXAMINATION:   Physical Exam  GENERAL:  74 y.o.-year-old patient lying in the bed with no acute distress.  Chronically ill, deconditioned EYES: Pupils equal, round, reactive to light and accommodation. No scleral icterus.   HEENT: Head atraumatic, normocephalic. Oropharynx and nasopharynx clear.  NECK:  Supple, no jugular venous distention. No thyroid enlargement, no tenderness.  LUNGS: Normal breath sounds bilaterally, no wheezing, rales, rhonchi. No use of accessory muscles of respiration.  CARDIOVASCULAR: S1, S2 normal. No murmurs, rubs, or gallops.  ABDOMEN: Soft, nontender, nondistended. Bowel sounds present. No organomegaly or mass. PEG+ EXTREMITIES: No cyanosis, clubbing or edema b/l.    NEUROLOGIC: chronic left hemiparesis, muscle atrophy PSYCHIATRIC:  patient has flat affect SKIN:as below Pressure Injury 05/30/2019 Heel Left;Medial Unstageable - Full thickness tissue loss in which the base of the injury is covered by slough (yellow, tan, gray, green or brown) and/or eschar (tan, brown or black) in the  wound bed. unstagable with eshcar on le (Active)  06/01/2019 1830  Location: Heel  Location Orientation: Left;Medial  Staging: Unstageable - Full thickness tissue loss in which the base of the injury is covered by slough (yellow, tan, gray, green or brown) and/or eschar (tan, brown or black) in the wound bed.  Wound Description (Comments): unstagable with eshcar on left heel  Present on Admission: Yes     Pressure Injury 05/13/2019 Toe (Comment  which one) Left;Medial Deep Tissue Pressure Injury - Purple or maroon localized area of discolored intact skin or blood-filled blister due to damage of underlying soft tissue from pressure and/or shear. deep tissure (Active)  05/16/2019 1830  Location: Toe (Comment  which one) (left big toe)  Location Orientation: Left;Medial  Staging: Deep Tissue Pressure Injury - Purple or maroon localized area of discolored intact skin or blood-filled blister due to damage of underlying soft tissue from pressure and/or shear.  Wound Description (Comments): deep tissure injury on left big toe  Present on Admission: Yes     Pressure Injury 05/23/2019 Sacrum Medial Stage 2 -  Partial thickness loss of dermis presenting as a shallow open injury with a red, pink wound bed without slough. Pink (Active)  05/24/2019 1000  Location: Sacrum  Location Orientation: Medial  Staging: Stage 2 -  Partial thickness loss of dermis presenting as a shallow open injury with a red, pink wound bed without slough.  Wound Description (Comments): Pink  Present on Admission:       LABORATORY PANEL:  CBC No results for input(s): WBC, HGB, HCT, PLT in the last  168 hours.  Chemistries  Recent Labs  Lab 05/16/2019 0654  NA 141  K 3.5  CL 109  CO2 27  GLUCOSE 130*  BUN 17  CREATININE 2.54*  CALCIUM 8.2*   Cardiac Enzymes No results for input(s): TROPONINI in the last 168 hours. RADIOLOGY:  PERIPHERAL VASCULAR CATHETERIZATION  Result Date: 05/13/2019 See Op Note  ASSESSMENT AND  PLAN:  Korin Setzler is a 74 y.o.femalwho is bed bound and from SNF with history ofend-stage renal disease recently started on hemodialysis, history of right MCA stroke, history of dysphagia due to Schatzki's ring and is status post esophageal dilatation who was admitted with altered mental status.Her daughter noted poor PO intake thought to be due to dysphagia.   MRSA bacteremia/ septic shock -due to infected dialysis cath - appreciate ID assistance- currently on Daptomycin and Teflaro- awating approval for dapto -CK 16--166--CK on friday - repeat cultures 4/9 negative - TEE negative for vegetation -s/p  IV Hickman catheter placement by Dr schnier on 05/16/2019 for Teflaro for minimal 6 wks (last dose 06/20/2019)  Dysphagia- chronic issue - EGD performed 4/14 revealed and esophageal stenosis that was dilated  Poor oral intake  eating < 50 % of meals  - 4/17> continue to barely eat meals - 4/17>- added Megace- increased Nepro- started calorie count - 4/20> she is wanting a feeding tube-- s/p PEG tube  placed By Dr Allen Norris on 4/22 -Start TF today  -We will need to follow for re-feeding syndrome afterwards-pt's dter [name] is aware  Acute hypoxic respiratory failure - 4/15> on 3-4 L of O 2 with pulse ox in mid 90s - RR in high 20-low 30 range - fluid removed with dialysis on 4/16- no longer hypoxic   -prn oxygen  ESRD on HD - infected cath removed and tunnelled HD cath placed on 4/12 -cont dialysis per renal team - MWF dialysis with [facility]  HTN - meds being titrated by Renal  Acute anemia (possible related to infection/ acute illness) in addition to CKD - has been transfused 3 U PRBC so far - 4/3 (1 U) and 4/12 ( 2 U)   Diabetes Mellitus 2 with ESRD  - A1c checked on 4/3 was 5.6 and she was not on medications as outpt -now in TF   Status post CVA - bed bound with left sided weakness (0/5) and left facial droop - resume plavix - cont Statin  Seizure  disorder - cont Keppra  Mild thrombocytopenia - possible due to sepsis- follow  Depression - has a flat affect, poor appetite - cont Remeron and Zoloft  Pt was seen by Palliative care on 05/12/2019--Family wants everything done.   DVT prophylaxis: SCDs Code Status: Full code Family Communication: speaking with daughter [name] [place] Disposition Plan: return to SNF eventually  -pt is s/p PEG placement. TF to be started today - will watch for re-feeding syndrome for couple days Consultants: Nephro,Cardiology,GI,ID,Palliative care Procedures:  4/14- EGDBenign-appearing esophageal stenosis 4/12- left IJ 2 D ECHO 4/22 PEG tube  Status is: Inpatient Dispo: The patient is from:rehab              Anticipated d/c is to her SNF              Anticipated d/c date is: likley Monday 4/26--after PEG placed need to monitor of refeeding syndrome for few days.  TOC aware for d/c date Pt's dter Robin aware of the plan  Patient currently best at baseline. Multiple co-morbidities. Long term prognosis poor  TOTAL TIME TAKING CARE OF THIS PATIENT: *25* minutes.  >50% time spent on counselling and coordination of care  Note: This dictation was prepared with Dragon dictation along with smaller phrase technology. Any transcriptional errors that result from this process are unintentional.  Fritzi Mandes M.D    [facility]   CC: Primary care physician; Marisa Hua, MDPatient ID: Sherie Don, female   DOB: 12/23/1945, 74 y.o.   MRN: [id number]

## 2019-05-30 NOTE — Progress Notes (Signed)
[facility]  ROUNDING NOTE   Subjective:  Patient tolerated PEG tube placement well yesterday. Also completed dialysis today. Overall still remains quite weak and debilitated however.   Objective:  Vital signs in last 24 hours:  Temp:  [97.4 F (36.3 C)-98.2 F (36.8 C)] 98 F (36.7 C) (04/23 1010) Pulse Rate:  [60-78] 68 (04/23 1300) Resp:  [16-20] 20 (04/23 1010) BP: (102-153)/(37-66) 127/47 (04/23 1300) SpO2:  [93 %-100 %] 100 % (04/23 1300) Weight:  [65.2 kg] 65.2 kg (04/23 1010)  Weight change:  Filed Weights   06/02/2019 0630 05/25/2019 0500 05/30/19 1010  Weight: 74.6 kg 70.4 kg 65.2 kg    Intake/Output: I/O last 3 completed shifts: In: 150 [I.V.:150] Out: 1 [Blood:1]   Intake/Output this shift:  No intake/output data recorded.  Physical Exam: General: Ill appearing  Head: Normocephalic, atraumatic.   Eyes: Anicteric  Neck: Supple  Lungs:  Clear bilateral, normal effort  Heart: Regular rhythm  Abdomen:  Soft, nontender   Extremities: No peripheral edema. Feet in soft supports  Neurologic: Awake, alert, conversant.  Skin: No lesions  Access: Left IJ PermCath    Basic Metabolic Panel: Recent Labs  Lab 06/06/2019 1558 05/24/2019 0654 05/30/19 1236  NA  --  141  --   K  --  3.5  --   CL  --  109  --   CO2  --  27  --   GLUCOSE  --  130*  --   BUN  --  17  --   CREATININE  --  2.54*  --   CALCIUM  --  8.2*  --   MG  --   --  1.5*  PHOS 2.2*  --  1.8*    Liver Function Tests: No results for input(s): AST, ALT, ALKPHOS, BILITOT, PROT, ALBUMIN in the last 168 hours. No results for input(s): LIPASE, AMYLASE in the last 168 hours. No results for input(s): AMMONIA in the last 168 hours.  CBC: No results for input(s): WBC, NEUTROABS, HGB, HCT, MCV, PLT in the last 168 hours.  Cardiac Enzymes: Recent Labs  Lab 05/25/2019 0654 05/30/19 1236  CKTOTAL 166 124    BNP: Invalid input(s): POCBNP  CBG: Recent Labs  Lab 05/25/19 0918  05/25/19 1130 05/25/19 1645 05/25/19 2113 05/16/2019 1132  GLUCAP 149* 155* 112* 178* 90    Microbiology: Results for orders placed or performed during the hospital encounter of 05/31/2019  Blood culture (routine x 2)     Status: Abnormal   Collection Time: 05/25/2019 12:34 PM   Specimen: BLOOD  Result Value Ref Range Status   Specimen Description   Final    BLOOD R UP ARM Performed at [facility], [address], [place], [place] [address]    Special Requests   Final    BOTTLES DRAWN AEROBIC AND ANAEROBIC Blood Culture adequate volume Performed at [facility], [address] [place], [place] [address]    Culture  Setup Time   Final    Organism ID to follow IN [place] TO, READ BACK BY AND VERIFIED WITH: [place] ON 05/11/19 AT 0210 [facility] Performed at [facility], [address] [place], [place] [address]    Culture (A)  Final    STAPHYLOCOCCUS AUREUS SUSCEPTIBILITIES PERFORMED ON PREVIOUS CULTURE WITHIN THE LAST 5 DAYS. Performed at [facility], [place] [address], [place], [place] [address]    Report  Status 05/13/2019 FINAL  Final  Blood Culture ID Panel (Reflexed)     Status: Abnormal   Collection Time: 05/23/2019 12:34 PM  Result Value Ref Range Status   Enterococcus species NOT DETECTED NOT DETECTED Final   Listeria monocytogenes NOT DETECTED NOT DETECTED Final   Staphylococcus species DETECTED (A) NOT DETECTED Final    Comment: CRITICAL RESULT CALLED TO, READ BACK BY AND VERIFIED WITH: SCOTT HALL ON 05/11/19 AT 0210 [facility]    Staphylococcus aureus (BCID) DETECTED (A) NOT DETECTED Final    Comment: Methicillin (oxacillin)-resistant Staphylococcus aureus (MRSA). MRSA is predictably resistant to beta-lactam antibiotics (except ceftaroline). Preferred therapy is vancomycin unless clinically contraindicated. Patient requires contact precautions if  hospitalized. CRITICAL RESULT  CALLED TO, READ BACK BY AND VERIFIED WITH: SCOTT HALL ON 05/11/19 AT 0210 [facility]    Methicillin resistance DETECTED (A) NOT DETECTED Final    Comment: CRITICAL RESULT CALLED TO, READ BACK BY AND VERIFIED WITH: SCOTT HALL ON 05/11/19 AT 0210 [place]    Streptococcus species NOT DETECTED NOT DETECTED Final   Streptococcus agalactiae NOT DETECTED NOT DETECTED Final   Streptococcus pneumoniae NOT DETECTED NOT DETECTED Final   Streptococcus pyogenes NOT DETECTED NOT DETECTED Final   Acinetobacter baumannii NOT DETECTED NOT DETECTED Final   Enterobacteriaceae species NOT DETECTED NOT DETECTED Final   Enterobacter cloacae complex NOT DETECTED NOT DETECTED Final   Escherichia coli NOT DETECTED NOT DETECTED Final   Klebsiella oxytoca NOT DETECTED NOT DETECTED Final   Klebsiella pneumoniae NOT DETECTED NOT DETECTED Final   Proteus species NOT DETECTED NOT DETECTED Final   Serratia marcescens NOT DETECTED NOT DETECTED Final   Haemophilus influenzae NOT DETECTED NOT DETECTED Final   Neisseria meningitidis NOT DETECTED NOT DETECTED Final   Pseudomonas aeruginosa NOT DETECTED NOT DETECTED Final   Candida albicans NOT DETECTED NOT DETECTED Final   Candida glabrata NOT DETECTED NOT DETECTED Final   Candida krusei NOT DETECTED NOT DETECTED Final   Candida parapsilosis NOT DETECTED NOT DETECTED Final   Candida tropicalis NOT DETECTED NOT DETECTED Final    Comment: Performed at [facility], [address] [place], [place] [address]  Blood culture (routine x 2)     Status: Abnormal   Collection Time: 05/09/2019  1:24 PM   Specimen: BLOOD  Result Value Ref Range Status   Specimen Description   Final    BLOOD R LATREAL BICEP Performed at [facility], [address] [place], [place] [address]    Special Requests   Final    BOTTLES DRAWN AEROBIC AND ANAEROBIC Blood Culture adequate volume Performed at [facility], [address] [place], [place] [address]    Culture  Setup Time    Final    IN BOTH AEROBIC AND ANAEROBIC BOTTLES GRAM POSITIVE COCCI CRITICAL VALUE NOTED.  VALUE IS CONSISTENT WITH PREVIOUSLY REPORTED AND CALLED VALUE. Performed at [facility], [address] [place], [place] [address]    Culture METHICILLIN RESISTANT STAPHYLOCOCCUS AUREUS (A)  Final   Report Status 05/13/2019 FINAL  Final   Organism ID, Bacteria METHICILLIN RESISTANT STAPHYLOCOCCUS AUREUS  Final      Susceptibility   Methicillin resistant staphylococcus aureus - MIC*    CIPROFLOXACIN >=8 RESISTANT Resistant     ERYTHROMYCIN >=8 RESISTANT Resistant     GENTAMICIN <=0.5 SENSITIVE Sensitive     OXACILLIN >=4 RESISTANT Resistant     TETRACYCLINE <=1 SENSITIVE Sensitive     VANCOMYCIN <=0.5 SENSITIVE Sensitive     TRIMETH/SULFA >=  320 RESISTANT Resistant     CLINDAMYCIN <=0.25 SENSITIVE Sensitive     RIFAMPIN <=0.5 SENSITIVE Sensitive     Inducible Clindamycin NEGATIVE Sensitive     * METHICILLIN RESISTANT STAPHYLOCOCCUS AUREUS  Respiratory Panel by RT PCR (Flu A&B, Covid) - Nasopharyngeal Swab     Status: None   Collection Time: 05/24/2019  1:24 PM   Specimen: Nasopharyngeal Swab  Result Value Ref Range Status   SARS Coronavirus 2 by RT PCR NEGATIVE NEGATIVE Final    Comment: (NOTE) SARS-CoV-2 target nucleic acids are NOT DETECTED. The SARS-CoV-2 RNA is generally detectable in upper respiratoy specimens during the acute phase of infection. The lowest concentration of SARS-CoV-2 viral copies this assay can detect is 131 copies/mL. A negative result does not preclude SARS-Cov-2 infection and should not be used as the sole basis for treatment or other patient management decisions. A negative result may occur with  improper specimen collection/handling, submission of specimen other than nasopharyngeal swab, presence of viral mutation(s) within the areas targeted by this assay, and inadequate number of viral copies (<131 copies/mL). A negative result must be combined with  clinical observations, patient history, and epidemiological information. The expected result is Negative. Fact Sheet for Patients:  PinkCheek.be Fact Sheet for Healthcare Providers:  GravelBags.it This test is not yet ap proved or cleared by the [place] FDA and  has been authorized for detection and/or diagnosis of SARS-CoV-2 by FDA under an Emergency Use Authorization (EUA). This EUA will remain  in effect (meaning this test can be used) for the duration of the COVID-19 declaration under Section 564(b)(1) of the Act, 21 U.S.C. section 360bbb-3(b)(1), unless the authorization is terminated or revoked sooner.    Influenza A by PCR NEGATIVE NEGATIVE Final   Influenza B by PCR NEGATIVE NEGATIVE Final    Comment: (NOTE) The Xpert Xpress SARS-CoV-2/FLU/RSV assay is intended as an aid in  the diagnosis of influenza from Nasopharyngeal swab specimens and  should not be used as a sole basis for treatment. Nasal washings and  aspirates are unacceptable for Xpert Xpress SARS-CoV-2/FLU/RSV  testing. Fact Sheet for Patients: PinkCheek.be Fact Sheet for Healthcare Providers: GravelBags.it This test is not yet approved or cleared by the [place] FDA and  has been authorized for detection and/or diagnosis of SARS-CoV-2 by  FDA under an Emergency Use Authorization (EUA). This EUA will remain  in effect (meaning this test can be used) for the duration of the  Covid-19 declaration under Section 564(b)(1) of the Act, 21  U.S.C. section 360bbb-3(b)(1), unless the authorization is  terminated or revoked. Performed at [facility], [address] [place], [place] [address]   MRSA PCR Screening     Status: None   Collection Time: 05/24/2019  6:27 PM   Specimen: Nasopharyngeal  Result Value Ref Range Status   MRSA by PCR NEGATIVE NEGATIVE Final    Comment:         The GeneXpert MRSA Assay (FDA approved for NASAL specimens only), is one component of a comprehensive MRSA colonization surveillance program. It is not intended to diagnose MRSA infection nor to guide or monitor treatment for MRSA infections. Performed at [facility], [address], [place], [place] [address]   Urine culture     Status: Abnormal   Collection Time: 05/14/2019 10:30 PM   Specimen: Urine, Clean Catch  Result Value Ref Range Status   Specimen Description   Final    URINE, CLEAN CATCH Performed at [facility],  [place], [place] [address]    Special Requests   Final    NONE Performed at [facility], [place], [place] [address]    Culture (A)  Final    >=100,000 COLONIES/mL KLEBSIELLA PNEUMONIAE >=100,000 COLONIES/mL VANCOMYCIN RESISTANT ENTEROCOCCUS    Report Status 05/14/2019 FINAL  Final   Organism ID, Bacteria KLEBSIELLA PNEUMONIAE (A)  Final   Organism ID, Bacteria VANCOMYCIN RESISTANT ENTEROCOCCUS (A)  Final      Susceptibility   Klebsiella pneumoniae - MIC*    AMPICILLIN >=32 RESISTANT Resistant     CEFAZOLIN <=4 SENSITIVE Sensitive     CEFTRIAXONE <=0.25 SENSITIVE Sensitive     CIPROFLOXACIN <=0.25 SENSITIVE Sensitive     GENTAMICIN <=1 SENSITIVE Sensitive     IMIPENEM 0.5 SENSITIVE Sensitive     NITROFURANTOIN 64 INTERMEDIATE Intermediate     TRIMETH/SULFA <=20 SENSITIVE Sensitive     AMPICILLIN/SULBACTAM 4 SENSITIVE Sensitive     PIP/TAZO <=4 SENSITIVE Sensitive     * >=100,000 COLONIES/mL KLEBSIELLA PNEUMONIAE   Vancomycin resistant enterococcus - MIC*    AMPICILLIN >=32 RESISTANT Resistant     NITROFURANTOIN 256 RESISTANT Resistant     VANCOMYCIN >=32 RESISTANT Resistant     LINEZOLID 2 SENSITIVE Sensitive     * >=100,000 COLONIES/mL VANCOMYCIN RESISTANT ENTEROCOCCUS  Cath Tip Culture     Status: Abnormal   Collection Time: 05/11/19  1:10 PM   Specimen: Catheter Tip; Other  Result  Value Ref Range Status   Specimen Description   Final    CATH TIP Performed at [facility], [address] [place], [place] [address]    Special Requests   Final    NONE Performed at [facility], [address] [place], [place] [address]    Culture (A)  Final    >=100,000 COLONIES/mL METHICILLIN RESISTANT STAPHYLOCOCCUS AUREUS   Report Status 05/13/2019 FINAL  Final   Organism ID, Bacteria METHICILLIN RESISTANT STAPHYLOCOCCUS AUREUS (A)  Final      Susceptibility   Methicillin resistant staphylococcus aureus - MIC*    CIPROFLOXACIN >=8 RESISTANT Resistant     ERYTHROMYCIN >=8 RESISTANT Resistant     GENTAMICIN <=0.5 SENSITIVE Sensitive     OXACILLIN >=4 RESISTANT Resistant     TETRACYCLINE <=1 SENSITIVE Sensitive     VANCOMYCIN 1 SENSITIVE Sensitive     TRIMETH/SULFA >=320 RESISTANT Resistant     CLINDAMYCIN <=0.25 SENSITIVE Sensitive     RIFAMPIN <=0.5 SENSITIVE Sensitive     Inducible Clindamycin NEGATIVE Sensitive     * >=100,000 COLONIES/mL METHICILLIN RESISTANT STAPHYLOCOCCUS AUREUS  Culture, blood (routine x 2)     Status: Abnormal   Collection Time: 05/11/19  4:27 PM   Specimen: BLOOD  Result Value Ref Range Status   Specimen Description   Final    BLOOD A-LINE Performed at [facility], [address] [place], [place] [address]    Special Requests   Final    BOTTLES DRAWN AEROBIC AND ANAEROBIC Blood Culture adequate volume Performed at [facility], [address] [place], [place] [address]    Culture  Setup Time   Final    GRAM POSITIVE COCCI ANAEROBIC BOTTLE ONLY CRITICAL VALUE NOTED.  VALUE IS CONSISTENT WITH PREVIOUSLY REPORTED AND CALLED VALUE. Performed at [facility], [address] [place], [place] [address]    Culture (A)  Final    STAPHYLOCOCCUS AUREUS SUSCEPTIBILITIES PERFORMED ON PREVIOUS CULTURE WITHIN THE LAST 5 DAYS. Performed  at [facility], [place] [address], [place], [place]  [address]    Report Status 05/18/2019 FINAL  Final  Culture, blood (routine x 2)     Status: Abnormal   Collection Time: 05/12/19  6:06 AM   Specimen: BLOOD  Result Value Ref Range Status   Specimen Description BLOOD BLOOD RIGHT HAND  Final   Special Requests   Final    BOTTLES DRAWN AEROBIC AND ANAEROBIC Blood Culture adequate volume   Culture  Setup Time   Final    GRAM POSITIVE COCCI ANAEROBIC BOTTLE ONLY CRITICAL RESULT CALLED TO, READ BACK BY AND VERIFIED WITH: DAVID BESANTI AT 10PM ON 05/12/2019 BY MOSLEY,J    Culture METHICILLIN RESISTANT STAPHYLOCOCCUS AUREUS (A)  Final   Report Status 05/12/2019 FINAL  Final   Organism ID, Bacteria METHICILLIN RESISTANT STAPHYLOCOCCUS AUREUS  Final      Susceptibility   Methicillin resistant staphylococcus aureus - MIC*    CIPROFLOXACIN >=8 RESISTANT Resistant     ERYTHROMYCIN >=8 RESISTANT Resistant     GENTAMICIN <=0.5 SENSITIVE Sensitive     OXACILLIN >=4 RESISTANT Resistant     TETRACYCLINE <=1 SENSITIVE Sensitive     VANCOMYCIN 1 SENSITIVE Sensitive     TRIMETH/SULFA >=320 RESISTANT Resistant     CLINDAMYCIN <=0.25 SENSITIVE Sensitive     RIFAMPIN <=0.5 SENSITIVE Sensitive     Inducible Clindamycin NEGATIVE Sensitive     LINEZOLID Value in next row Sensitive      SENSITIVE2Performed at [place] [address], [place], [place] [address]    * METHICILLIN RESISTANT STAPHYLOCOCCUS AUREUS  CULTURE, BLOOD (ROUTINE X 2) w Reflex to ID Panel     Status: Abnormal   Collection Time: 05/13/19  2:20 PM   Specimen: BLOOD  Result Value Ref Range Status   Specimen Description   Final    BLOOD BLOOD RIGHT HAND Performed at [place], [address] [place], [place] [address]    Special Requests   Final    BOTTLES DRAWN AEROBIC AND ANAEROBIC Blood Culture adequate volume Performed at [facility], [address], [place], [place] [address]    Culture  Setup Time   Final    GRAM POSITIVE COCCI ANAEROBIC BOTTLE  ONLY CRITICAL RESULT CALLED TO, READ BACK BY AND VERIFIED WITH: [facility] KATSOUDAS AT 8889 05/14/19 SDR Performed at [facility], [address] [place], [place] [address]    Culture (A)  Final    STAPHYLOCOCCUS AUREUS SUSCEPTIBILITIES PERFORMED ON PREVIOUS CULTURE WITHIN THE LAST 5 DAYS. Performed at [facility], [place] [address], [place], [place] [address]    Report Status 05/16/2019 FINAL  Final  CULTURE, BLOOD (ROUTINE X 2) w Reflex to ID Panel     Status: None   Collection Time: 05/13/19  3:39 PM   Specimen: BLOOD  Result Value Ref Range Status   Specimen Description BLOOD BLOOD LEFT HAND  Final   Special Requests   Final    BOTTLES DRAWN AEROBIC ONLY Blood Culture adequate volume   Culture   Final    NO GROWTH 5 DAYS Performed at [facility], [address] [place], [place] [address]    Report Status 05/18/2019 FINAL  Final  CULTURE, BLOOD (ROUTINE X 2) w Reflex to ID Panel     Status: Abnormal   Collection Time: 05/14/19  3:33 PM   Specimen: BLOOD  Result Value Ref Range Status   Specimen Description   Final  BLOOD BLOOD RIGHT HAND Performed at [facility], [address], [place], [place] [address]    Special Requests   Final    BOTTLES DRAWN AEROBIC AND ANAEROBIC Blood Culture adequate volume Performed at [facility], [address] [place], [place] [address]    Culture  Setup Time   Final    GRAM POSITIVE COCCI IN CLUSTERS ANAEROBIC BOTTLE ONLY CRITICAL VALUE NOTED.  VALUE IS CONSISTENT WITH PREVIOUSLY REPORTED AND CALLED VALUE.    Culture (A)  Final    STAPHYLOCOCCUS AUREUS SEE SEPARATE REPORT IN [facility] FOR DOS 05/14/19. Performed at [facility], [place] [address], [place], [place] [address]    Report Status 05/24/2019 FINAL  Final  Cath Tip Culture     Status: None   Collection Time: 05/14/19  4:24 PM   Specimen: Catheter Tip; Other  Result Value Ref Range Status   Specimen Description   Final    CATH  TIP Performed at [facility], [address], [place], [place] [address]    Special Requests   Final    NONE Performed at [facility], [address] [place], [place] [address]    Culture   Final    NO GROWTH 3 DAYS Performed at [facility], [place] [address] [place], [place] [address]    Report Status 05/17/2019 FINAL  Final  CULTURE, BLOOD (ROUTINE X 2) w Reflex to ID Panel     Status: Abnormal   Collection Time: 05/14/19  6:25 PM   Specimen: BLOOD  Result Value Ref Range Status   Specimen Description   Final    BLOOD BLOOD LEFT HAND Performed at [facility], [address] [place], [place] [address]    Special Requests   Final    BOTTLES DRAWN AEROBIC AND ANAEROBIC Blood Culture results may not be optimal due to an inadequate volume of blood received in culture bottles Performed at [facility], [address], [place], [place] [address]    Culture  Setup Time   Final    GRAM POSITIVE COCCI ANAEROBIC BOTTLE ONLY CRITICAL VALUE NOTED.  VALUE IS CONSISTENT WITH PREVIOUSLY REPORTED AND CALLED VALUE. [place] Performed at [facility], [address] [place], [place] [address]    Culture (A)  Final    STAPHYLOCOCCUS AUREUS SUSCEPTIBILITIES PERFORMED ON PREVIOUS CULTURE WITHIN THE LAST 5 DAYS. Performed at [facility], [place] [address], [place], [place] [address]    Report Status 05/17/2019 FINAL  Final  Culture, blood (Routine X 2) w Reflex to ID Panel     Status: None   Collection Time: 05/16/19 10:45 AM   Specimen: BLOOD  Result Value Ref Range Status   Specimen Description BLOOD RIGHT HAND  Final   Special Requests BLOOD Blood Culture adequate volume  Final   Culture   Final    NO GROWTH 5 DAYS Performed at [facility], [address] [place], [place] [address]    Report Status 05/09/2019 FINAL  Final  Culture, blood (Routine X 2) w Reflex to ID Panel     Status: None   Collection Time:  05/16/19 12:38 PM   Specimen: BLOOD  Result Value Ref Range Status   Specimen Description BLOOD RT HAND  Final   Special Requests   Final    BOTTLES DRAWN AEROBIC AND ANAEROBIC Blood Culture adequate volume   Culture   Final    NO GROWTH 5 DAYS Performed  at [facility], [address] [place], [place] [address]    Report Status 05/16/2019 FINAL  Final  Respiratory Panel by RT PCR (Flu A&B, Covid) - Nasopharyngeal Swab     Status: None   Collection Time: 05/18/2019 12:38 PM   Specimen: Nasopharyngeal Swab  Result Value Ref Range Status   SARS Coronavirus 2 by RT PCR NEGATIVE NEGATIVE Final    Comment: (NOTE) SARS-CoV-2 target nucleic acids are NOT DETECTED. The SARS-CoV-2 RNA is generally detectable in upper respiratoy specimens during the acute phase of infection. The lowest concentration of SARS-CoV-2 viral copies this assay can detect is 131 copies/mL. A negative result does not preclude SARS-Cov-2 infection and should not be used as the sole basis for treatment or other patient management decisions. A negative result may occur with  improper specimen collection/handling, submission of specimen other than nasopharyngeal swab, presence of viral mutation(s) within the areas targeted by this assay, and inadequate number of viral copies (<131 copies/mL). A negative result must be combined with clinical observations, patient history, and epidemiological information. The expected result is Negative. Fact Sheet for Patients:  PinkCheek.be Fact Sheet for Healthcare Providers:  GravelBags.it This test is not yet ap proved or cleared by the [place] FDA and  has been authorized for detection and/or diagnosis of SARS-CoV-2 by FDA under an Emergency Use Authorization (EUA). This EUA will remain  in effect (meaning this test can be used) for the duration of the COVID-19 declaration under Section 564(b)(1) of the Act,  21 U.S.C. section 360bbb-3(b)(1), unless the authorization is terminated or revoked sooner.    Influenza A by PCR NEGATIVE NEGATIVE Final   Influenza B by PCR NEGATIVE NEGATIVE Final    Comment: (NOTE) The Xpert Xpress SARS-CoV-2/FLU/RSV assay is intended as an aid in  the diagnosis of influenza from Nasopharyngeal swab specimens and  should not be used as a sole basis for treatment. Nasal washings and  aspirates are unacceptable for Xpert Xpress SARS-CoV-2/FLU/RSV  testing. Fact Sheet for Patients: PinkCheek.be Fact Sheet for Healthcare Providers: GravelBags.it This test is not yet approved or cleared by the [place] FDA and  has been authorized for detection and/or diagnosis of SARS-CoV-2 by  FDA under an Emergency Use Authorization (EUA). This EUA will remain  in effect (meaning this test can be used) for the duration of the  Covid-19 declaration under Section 564(b)(1) of the Act, 21  U.S.C. section 360bbb-3(b)(1), unless the authorization is  terminated or revoked. Performed at [facility], [address] [place], [place] [address]     Coagulation Studies: No results for input(s): LABPROT, INR in the last 72 hours.  Urinalysis: No results for input(s): COLORURINE, LABSPEC, PHURINE, GLUCOSEU, HGBUR, BILIRUBINUR, KETONESUR, PROTEINUR, UROBILINOGEN, NITRITE, LEUKOCYTESUR in the last 72 hours.  Invalid input(s): APPERANCEUR    Imaging: No results found.   Medications:   . sodium chloride 250 mL (05/18/2019 0334)  . ceFTAROline (TEFLARO) IV 200 mg (05/30/19 0208)  . DAPTOmycin (CUBICIN)  IV 500 mg (05/21/2019 2250)  . feeding supplement (OSMOLITE 1.5 CAL)    . magnesium sulfate bolus IVPB    . potassium PHOSPHATE IVPB (in mmol)     . allopurinol  100 mg Oral QPM  . amLODipine  2.5 mg Oral Daily  . vitamin C  500 mg Per Tube BID  . B-complex with vitamin C  1 tablet Per Tube Daily  . brimonidine   1 drop Both Eyes Q12H   And  . timolol  1 drop Both Eyes Q12H  . carvedilol  25 mg Oral BID WC  . Chlorhexidine Gluconate Cloth  6 each Topical Daily  . clopidogrel  75 mg Oral QPM  . epoetin (EPOGEN/PROCRIT) injection  4,000 Units Intravenous Q M,W,F-HD  . [START ON 05/31/2019] feeding supplement (PRO-STAT SUGAR FREE 64)  30 mL Per Tube Daily  . free water  30 mL Per Tube Q4H  . latanoprost  1 drop Both Eyes QPM  . levETIRAcetam  1,000 mg Oral QPM  . megestrol  400 mg Oral BID  . midodrine  5 mg Oral Q M,W,F  . mirtazapine  7.5 mg Oral QHS  . pantoprazole  40 mg Oral QHS  . sertraline  50 mg Oral Daily     so far Assessment/ Plan:  [name] is a 74 y.o. black female with end stage renal disease on hemodialysis, CVA with left hemiparesis, hypertension, hyperlipidemia, GERD, glaucoma, diabetes mellitus type II, coronary artery disease, gout, seizure disorder who was admitted to [facility] on 06/06/2019 for Hypokalemia [E87.6] Shock ([place]) [R57.9] ESRD (end stage renal disease) ([place]) [N18.6] Hypotension, unspecified hypotension type [I95.9] Anemia, unspecified type [D64.9]  [facility] MWF  63.5kg  # End Stage Renal Disease:  Patient completed dialysis treatment today.  Tolerated well.  Next dialysis treatment scheduled for Monday if still here.  # Hypotension: with MRSA bacteremia/sepsis Persistent positive blood cultures from 4/7.   Neg results from 4/9 so far -Ceftaroline and daptomycin per ID.    #. Anemia with chronic kidney disease:  Lab Results  Component Value Date   HGB 10.1 (L) 05/25/2019  Epogen 4000 units with dialysis today.  # Secondary Hyperparathyroidism:  Lab Results  Component Value Date   PTH 232 (H) 05/11/2019   CALCIUM 8.2 (L) 06/04/2019   PHOS 1.8 (L) 05/30/2019  Phosphorus a bit low at 1.8.  Administer sodium phosphorus 30 mmol IV today.  #Malnutrition:   Currently on pureed diet albumin 2.0 on 05/18/2019 Eating less than 50% of her  meals.  Overall prognosis remains quite guarded given underlying malnutrition. Status post PEG tube placement 05/29/2019.     LOS: 20 Crystal Vargas 4/23/20212:43 PM

## 2019-05-31 ENCOUNTER — Inpatient Hospital Stay: Payer: Medicare Other

## 2019-05-31 DIAGNOSIS — B9562 Methicillin resistant Staphylococcus aureus infection as the cause of diseases classified elsewhere: Secondary | ICD-10-CM | POA: Diagnosis not present

## 2019-05-31 DIAGNOSIS — R7881 Bacteremia: Secondary | ICD-10-CM | POA: Diagnosis not present

## 2019-05-31 LAB — CBC
HCT: 26.2 % — ABNORMAL LOW (ref 36.0–46.0)
Hemoglobin: 8.1 g/dL — ABNORMAL LOW (ref 12.0–15.0)
MCH: 30 pg (ref 26.0–34.0)
MCHC: 30.9 g/dL (ref 30.0–36.0)
MCV: 97 fL (ref 80.0–100.0)
Platelets: 135 10*3/uL — ABNORMAL LOW (ref 150–400)
RBC: 2.7 MIL/uL — ABNORMAL LOW (ref 3.87–5.11)
RDW: 19.8 % — ABNORMAL HIGH (ref 11.5–15.5)
WBC: 13.4 10*3/uL — ABNORMAL HIGH (ref 4.0–10.5)
nRBC: 0.1 % (ref 0.0–0.2)

## 2019-05-31 LAB — BLOOD GAS, ARTERIAL
Acid-Base Excess: 1.5 mmol/L (ref 0.0–2.0)
Bicarbonate: 30.7 mmol/L — ABNORMAL HIGH (ref 20.0–28.0)
FIO2: 45
O2 Saturation: 99.5 %
Patient temperature: 37
pCO2 arterial: 70 mmHg (ref 32.0–48.0)
pH, Arterial: 7.25 — ABNORMAL LOW (ref 7.350–7.450)
pO2, Arterial: 191 mmHg — ABNORMAL HIGH (ref 83.0–108.0)

## 2019-05-31 LAB — COMPREHENSIVE METABOLIC PANEL
ALT: 22 U/L (ref 0–44)
AST: 23 U/L (ref 15–41)
Albumin: 1.8 g/dL — ABNORMAL LOW (ref 3.5–5.0)
Alkaline Phosphatase: 72 U/L (ref 38–126)
Anion gap: 6 (ref 5–15)
BUN: 18 mg/dL (ref 8–23)
CO2: 27 mmol/L (ref 22–32)
Calcium: 8.1 mg/dL — ABNORMAL LOW (ref 8.9–10.3)
Chloride: 104 mmol/L (ref 98–111)
Creatinine, Ser: 2.18 mg/dL — ABNORMAL HIGH (ref 0.44–1.00)
GFR calc Af Amer: 25 mL/min — ABNORMAL LOW (ref >60)
GFR calc non Af Amer: 21 mL/min — ABNORMAL LOW (ref >60)
Glucose, Bld: 460 mg/dL — ABNORMAL HIGH (ref 70–99)
Potassium: 3.7 mmol/L (ref 3.5–5.1)
Sodium: 137 mmol/L (ref 135–145)
Total Bilirubin: 0.6 mg/dL (ref 0.3–1.2)
Total Protein: 5.9 g/dL — ABNORMAL LOW (ref 6.5–8.1)

## 2019-05-31 LAB — GLUCOSE, CAPILLARY
Glucose-Capillary: 358 mg/dL — ABNORMAL HIGH (ref 70–99)
Glucose-Capillary: 403 mg/dL — ABNORMAL HIGH (ref 70–99)
Glucose-Capillary: 425 mg/dL — ABNORMAL HIGH (ref 70–99)
Glucose-Capillary: 430 mg/dL — ABNORMAL HIGH (ref 70–99)

## 2019-05-31 LAB — PHOSPHORUS
Phosphorus: 5 mg/dL — ABNORMAL HIGH (ref 2.5–4.6)
Phosphorus: 4.8 mg/dL — ABNORMAL HIGH (ref 2.5–4.6)

## 2019-05-31 LAB — BETA-HYDROXYBUTYRIC ACID: Beta-Hydroxybutyric Acid: 0.18 mmol/L (ref 0.05–0.27)

## 2019-05-31 LAB — POTASSIUM: Potassium: 3.2 mmol/L — ABNORMAL LOW (ref 3.5–5.1)

## 2019-05-31 LAB — OSMOLALITY: Osmolality: 318 mOsm/kg — ABNORMAL HIGH (ref 275–295)

## 2019-05-31 LAB — MAGNESIUM
Magnesium: 2 mg/dL (ref 1.7–2.4)
Magnesium: 1.9 mg/dL (ref 1.7–2.4)

## 2019-05-31 LAB — LIPASE, BLOOD: Lipase: 117 U/L — ABNORMAL HIGH (ref 11–51)

## 2019-05-31 LAB — PROCALCITONIN: Procalcitonin: 0.45 ng/mL

## 2019-05-31 LAB — AMYLASE: Amylase: 133 U/L — ABNORMAL HIGH (ref 28–100)

## 2019-05-31 MED ORDER — DEXTROSE 50 % IV SOLN: 0.0000 mL | INTRAVENOUS | Status: DC | PRN

## 2019-05-31 MED ORDER — PIPERACILLIN-TAZOBACTAM IN DEX 2-0.25 GM/50ML IV SOLN
2.2500 g | Freq: Three times a day (TID) | INTRAVENOUS | Status: DC
  Filled 2019-05-31 (×3): qty 50

## 2019-05-31 MED ORDER — INSULIN ASPART 100 UNIT/ML ~~LOC~~ SOLN
0.0000 [IU] | SUBCUTANEOUS | Status: DC
  Administered 2019-06-01: 4 [IU] via SUBCUTANEOUS
  Administered 2019-06-01: 20 [IU] via SUBCUTANEOUS
  Filled 2019-05-31 (×2): qty 1

## 2019-05-31 MED ORDER — PIPERACILLIN-TAZOBACTAM 3.375 G IVPB
3.3750 g | Freq: Three times a day (TID) | INTRAVENOUS | Status: DC
  Filled 2019-05-31 (×3): qty 50

## 2019-05-31 MED ORDER — INSULIN REGULAR(HUMAN) IN NACL 100-0.9 UT/100ML-% IV SOLN: INTRAVENOUS | Status: DC

## 2019-05-31 MED ORDER — SODIUM CHLORIDE 0.9 % IV SOLN: INTRAVENOUS | Status: DC

## 2019-05-31 MED ORDER — METRONIDAZOLE IN NACL 5-0.79 MG/ML-% IV SOLN
500.0000 mg | Freq: Three times a day (TID) | INTRAVENOUS | Status: DC
  Administered 2019-06-01: 500 mg via INTRAVENOUS
  Filled 2019-05-31 (×2): qty 100

## 2019-05-31 MED ORDER — INSULIN ASPART 100 UNIT/ML ~~LOC~~ SOLN: 0.0000 [IU] | SUBCUTANEOUS | Status: DC

## 2019-05-31 MED ORDER — OSMOLITE 1.5 CAL PO LIQD
1000.0000 mL | ORAL | Status: DC
  Administered 2019-05-31: 1000 mL

## 2019-05-31 MED ORDER — POTASSIUM CHLORIDE 20 MEQ/15ML (10%) PO SOLN
20.0000 meq | Freq: Once | ORAL | Status: AC
  Administered 2019-05-31: 13:00:00 20 meq
  Filled 2019-05-31 (×2): qty 15

## 2019-05-31 MED ORDER — INSULIN ASPART 100 UNIT/ML ~~LOC~~ SOLN
20.0000 [IU] | Freq: Once | SUBCUTANEOUS | Status: AC
  Administered 2019-05-31: 21:00:00 20 [IU] via SUBCUTANEOUS

## 2019-05-31 MED ORDER — INSULIN ASPART 100 UNIT/ML ~~LOC~~ SOLN
SUBCUTANEOUS | Status: AC
  Filled 2019-05-31: qty 1

## 2019-05-31 MED ORDER — DEXTROSE-NACL 5-0.45 % IV SOLN: INTRAVENOUS | Status: DC

## 2019-05-31 MED ORDER — DEXTROSE 5 % IV SOLN
0.5000 g | Freq: Two times a day (BID) | INTRAVENOUS | Status: DC
  Administered 2019-06-01: 0.5 g via INTRAVENOUS
  Filled 2019-05-31 (×2): qty 0.5

## 2019-05-31 NOTE — Progress Notes (Signed)
Crystal Vargas  MRN: [id number]  DOB/AGE: [birth year] 74 y.o.  Primary Care Physician:Henry-Smith, Arbie Cookey, MD  Admit date: 05/29/2019  Chief Complaint:  Chief Complaint  Patient presents with  . Code Sepsis    S-Pt presented on  05/12/2019 with  Chief Complaint  Patient presents with  . Code Sepsis  . Py laying comfortably, voices no new concerns.   meds . allopurinol  100 mg Oral QPM  . amLODipine  2.5 mg Oral Daily  . vitamin C  500 mg Per Tube BID  . B-complex with vitamin C  1 tablet Per Tube Daily  . brimonidine  1 drop Both Eyes Q12H   And  . timolol  1 drop Both Eyes Q12H  . carvedilol  25 mg Oral BID WC  . Chlorhexidine Gluconate Cloth  6 each Topical Daily  . clopidogrel  75 mg Oral QPM  . epoetin (EPOGEN/PROCRIT) injection  4,000 Units Intravenous Q M,W,F-HD  . feeding supplement (PRO-STAT SUGAR FREE 64)  30 mL Per Tube Daily  . free water  30 mL Per Tube Q4H  . latanoprost  1 drop Both Eyes QPM  . levETIRAcetam  1,000 mg Oral QPM  . megestrol  400 mg Oral BID  . midodrine  5 mg Oral Q M,W,F  . mirtazapine  7.5 mg Oral QHS  . pantoprazole  40 mg Oral QHS  . potassium chloride  20 mEq Per Tube Once  . sertraline  50 mg Oral Daily         ION:GEXBMW to get any date   Physical Exam: Vital signs in last 24 hours: Temp:  [98 F (36.7 C)-98.1 F (36.7 C)] 98.1 F (36.7 C) (04/24 0457) Pulse Rate:  [63-79] 74 (04/24 0814) Resp:  [17-21] 17 (04/24 0457) BP: (102-140)/(37-70) 140/51 (04/24 0814) SpO2:  [100 %] 100 % (04/24 0457) Weight:  [65.2 kg-115.7 kg] 115.7 kg (04/24 0611) Weight change:  Last BM Date: 05/30/19  Intake/Output from previous day: 04/23 0701 - 04/24 0700 In: 5783.9 [I.V.:38.7; NG/GT:519; IV Piggyback:5196.2] Out: 500  No intake/output data recorded.   Physical Exam: General- pt is chronically ill appearing. HEENT- atraumatic, normcephalic Resp- No acute REsp distress, CTA B/L NO Rhonchi CVS- S1S2 regular in rate and rhythm GIT-  BS+, soft, NT, ND, PEG tube in situ  EXT- NO LE Edema, Cyanosis Access-Left tunneled cath in situ   Lab Results: CBC No results for input(s): WBC, HGB, HCT, PLT in the last 72 hours.   Hemoglobin was 10.1 on April 14   BMET Recent Labs    05/31/19 0454  K 3.2*    MICRO Recent Results (from the past 240 hour(s))  Respiratory Panel by RT PCR (Flu A&B, Covid) - Nasopharyngeal Swab     Status: None   Collection Time: 06/04/2019 12:38 PM   Specimen: Nasopharyngeal Swab  Result Value Ref Range Status   SARS Coronavirus 2 by RT PCR NEGATIVE NEGATIVE Final    Comment: (NOTE) SARS-CoV-2 target nucleic acids are NOT DETECTED. The SARS-CoV-2 RNA is generally detectable in upper respiratoy specimens during the acute phase of infection. The lowest concentration of SARS-CoV-2 viral copies this assay can detect is 131 copies/mL. A negative result does not preclude SARS-Cov-2 infection and should not be used as the sole basis for treatment or other patient management decisions. A negative result may occur with  improper specimen collection/handling, submission of specimen other than nasopharyngeal swab, presence of viral mutation(s) within the areas targeted by this assay, and inadequate  number of viral copies (<131 copies/mL). A negative result must be combined with clinical observations, patient history, and epidemiological information. The expected result is Negative. Fact Sheet for Patients:  PinkCheek.be Fact Sheet for Healthcare Providers:  GravelBags.it This test is not yet ap proved or cleared by the [place] FDA and  has been authorized for detection and/or diagnosis of SARS-CoV-2 by FDA under an Emergency Use Authorization (EUA). This EUA will remain  in effect (meaning this test can be used) for the duration of the COVID-19 declaration under Section 564(b)(1) of the Act, 21 U.S.C. section 360bbb-3(b)(1), unless the  authorization is terminated or revoked sooner.    Influenza A by PCR NEGATIVE NEGATIVE Final   Influenza B by PCR NEGATIVE NEGATIVE Final    Comment: (NOTE) The Xpert Xpress SARS-CoV-2/FLU/RSV assay is intended as an aid in  the diagnosis of influenza from Nasopharyngeal swab specimens and  should not be used as a sole basis for treatment. Nasal washings and  aspirates are unacceptable for Xpert Xpress SARS-CoV-2/FLU/RSV  testing. Fact Sheet for Patients: PinkCheek.be Fact Sheet for Healthcare Providers: GravelBags.it This test is not yet approved or cleared by the [place] FDA and  has been authorized for detection and/or diagnosis of SARS-CoV-2 by  FDA under an Emergency Use Authorization (EUA). This EUA will remain  in effect (meaning this test can be used) for the duration of the  Covid-19 declaration under Section 564(b)(1) of the Act, 21  U.S.C. section 360bbb-3(b)(1), unless the authorization is  terminated or revoked. 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 Patient was last dialyzed yesterday No need for hemodialysis today   2) hypotension with MRSA bacteremia/sepsis Patient had blood cultures positive earlier Patient blood culture have been negative since April 9 Patient permacath was removed and new  catheter was placed on May 19, 2019  3)Anemia of chronic disease  HGb at goal (9--11) Patient is on Epogen protocol  4) secondary hyperparathyroidism -CKD Mineral-Bone Disorder   Secondary Hyperparathyroidism  Present. Phosphorus at goal.   5) malnutrition Patient is now s/p PEG tube placement on May 29, 2019  6) electrolytes   sodium Normonatremic   potassium Hypokalemia Being replete    7)Acid base Co2 at goal     Plan:   NO need for HD today Will continue to follow      Alano Blasco s [facility] 05/31/2019, 8:15 AM

## 2019-05-31 NOTE — Progress Notes (Signed)
Pt taken to CT by writer and tech and then to ICU 6. Report given at bedside.

## 2019-05-31 NOTE — Progress Notes (Signed)
PT Cancellation Note  Patient Details Name: Crystal Vargas MRN: [id number] DOB: [birth year]   Cancelled Treatment:    Reason Eval/Treat Not Completed: Other (comment).  Made two attempts to see pt and she was too lethargic.  May be appropriate to see tomorrow if more alert.   Ramond Dial 05/31/2019, 3:42 PM   Mee Hives, PT MS Acute Rehab Dept. Number: [place]

## 2019-05-31 NOTE — Consult Note (Signed)
PHARMACY CONSULT NOTE - FOLLOW UP  Pharmacy Consult for Electrolyte Monitoring and Replacement   Recent Labs: Potassium (mmol/L)  Date Value  05/31/2019 3.2 (L)   Magnesium (mg/dL)  Date Value  05/31/2019 2.0   Calcium (mg/dL)  Date Value  05/15/2019 8.2 (L)   Albumin (g/dL)  Date Value  06/02/2019 2.0 (L)   Phosphorus (mg/dL)  Date Value  05/31/2019 5.0 (H)   Sodium (mmol/L)  Date Value  06/06/2019 141     Assessment: 74 y.o.femalewho is bed bound and from SNF with history ofend-stage renal disease recently started on hemodialysis, history of right MCA stroke, history of dysphagia due to Schatzki's ring and is status post esophageal dilatation who was admitted with altered mental status. Risk for refeeding.   4/23: Will replace with Mg 2 g IV x 1 and potassium phosphate 10 mmol IV x 1. Follow up with electrolytes in with AM labs.  *Addendum: pt received Sodium Phosphate 30 mmol as ordered by nephrologist, instead of Potassium Phosphate.  Goal of Therapy:  WNL  Plan:  K 3.2  Mag 2.0  Phos 5.0   Scr 2.54 (4/23) Will order KCL 20 meq per tube x 1.-conservative since Hemodialysis pt.  Follow up with electrolytes in with AM labs.   Noralee Space ,PharmD Clinical Pharmacist 05/31/2019 7:42 AM

## 2019-05-31 NOTE — Progress Notes (Signed)
   05/31/19 2015  Clinical Encounter Type  Visited With Patient and family together  Visit Type Initial  Referral From Nurse  Consult/Referral To Chaplain  Spiritual Encounters  Spiritual Needs Emotional  Stress Factors  Family Stress Factors Health changes   CH met with PT and son. PT was being worked on by [employer]. [place] found son and offered services. Son declined services at this time.

## 2019-05-31 NOTE — Progress Notes (Signed)
[place] [facility] [place] NAME: Crystal Vargas    MR#:  [id number]  DATE OF BIRTH:  08/15/1945  SUBJECTIVE:   Pt answered most questions appropriately and few words. No new issues per RN TF directed well. No issues per RN. REVIEW OF SYSTEMS:   Review of Systems  Unable to perform ROS: Medical condition   Tolerating Diet: pt tolerates po meds Tolerating PT: bedbound  DRUG ALLERGIES:   Allergies  Allergen Reactions  . Abacavir Other (See Comments)  . Cephalosporins Other (See Comments)    Tolerated ceftaroline April 2021  . Ciprofloxacin Other (See Comments)  . Nsaids Other (See Comments)  . Penicillin G Other (See Comments)  . Quinolones Other (See Comments)  . Salicylates Other (See Comments)    VITALS:  Blood pressure (!) 124/49, pulse 61, temperature 98.1 F (36.7 C), temperature source Oral, resp. rate 17, height 5\' 2"  (1.575 m), weight 115.7 kg, SpO2 100 %.  PHYSICAL EXAMINATION:   Physical Exam  GENERAL:  74 y.o.-year-old patient lying in the bed with no acute distress.  Chronically ill, deconditioned EYES: Pupils equal, round, reactive to light and accommodation. No scleral icterus.   HEENT: Head atraumatic, normocephalic. Oropharynx and nasopharynx clear.  NECK:  Supple, no jugular venous distention. No thyroid enlargement, no tenderness.  LUNGS: Normal breath sounds bilaterally, no wheezing, rales, rhonchi. No use of accessory muscles of respiration.  CARDIOVASCULAR: S1, S2 normal. No murmurs, rubs, or gallops.  ABDOMEN: Soft, nontender, nondistended. Bowel sounds present. No organomegaly or mass. PEG+ abdominal binder present EXTREMITIES: No cyanosis, clubbing or edema b/l.    NEUROLOGIC: chronic left hemiparesis, muscle atrophy PSYCHIATRIC:  patient has flat affect SKIN:as below Pressure Injury 05/28/2019 Heel Left;Medial Unstageable - Full thickness tissue loss in which the base of the injury is covered by slough (yellow,  tan, gray, green or brown) and/or eschar (tan, brown or black) in the wound bed. unstagable with eshcar on le (Active)  05/28/2019 1830  Location: Heel  Location Orientation: Left;Medial  Staging: Unstageable - Full thickness tissue loss in which the base of the injury is covered by slough (yellow, tan, gray, green or brown) and/or eschar (tan, brown or black) in the wound bed.  Wound Description (Comments): unstagable with eshcar on left heel  Present on Admission: Yes     Pressure Injury 05/26/2019 Toe (Comment  which one) Left;Medial Deep Tissue Pressure Injury - Purple or maroon localized area of discolored intact skin or blood-filled blister due to damage of underlying soft tissue from pressure and/or shear. deep tissure (Active)  06/02/2019 1830  Location: Toe (Comment  which one) (left big toe)  Location Orientation: Left;Medial  Staging: Deep Tissue Pressure Injury - Purple or maroon localized area of discolored intact skin or blood-filled blister due to damage of underlying soft tissue from pressure and/or shear.  Wound Description (Comments): deep tissure injury on left big toe  Present on Admission: Yes     Pressure Injury 05/23/2019 Sacrum Medial Stage 2 -  Partial thickness loss of dermis presenting as a shallow open injury with a red, pink wound bed without slough. Pink (Active)  05/20/2019 1000  Location: Sacrum  Location Orientation: Medial  Staging: Stage 2 -  Partial thickness loss of dermis presenting as a shallow open injury with a red, pink wound bed without slough.  Wound Description (Comments): Pink  Present on Admission:       LABORATORY PANEL:  CBC No results for input(s):  WBC, HGB, HCT, PLT in the last 168 hours.  Chemistries  Recent Labs  Lab 06/02/2019 0654 05/30/19 1236 05/31/19 0454  NA 141  --   --   K 3.5  --  3.2*  CL 109  --   --   CO2 27  --   --   GLUCOSE 130*  --   --   BUN 17  --   --   CREATININE 2.54*  --   --   CALCIUM 8.2*  --   --   MG  --     < > 2.0   < > = values in this interval not displayed.   Cardiac Enzymes No results for input(s): TROPONINI in the last 168 hours. RADIOLOGY:  No results found. ASSESSMENT AND PLAN:  Crystal Vargas is a 74 y.o.femalewho is bed bound and from SNF with history ofend-stage renal disease recently started on hemodialysis, history of right MCA stroke, history of dysphagia due to Schatzki's ring and is status post esophageal dilatation who was admitted with altered mental status.Her daughter noted poor PO intake thought to be due to dysphagia.   MRSA bacteremia/ septic shock -due to infected dialysis cath - appreciate ID assistance- currently on Daptomycin and Teflaro- awating approval for dapto -CK 16--166--CK on friday - repeat cultures 4/9 negative - TEE negative for vegetation -s/p  IV Hickman catheter placement by Dr schnier on 05/23/2019 for Teflaro for minimal 6 wks (last dose 06/20/2019)  Dysphagia- chronic issue - EGD performed 4/14 revealed and esophageal stenosis that was dilated -patient will continue to take meds orally and oral feeding should be continued as well  Poor oral intake  eating < 50 % of meals  - 4/17> continue to barely eat meals - 4/17>- added Megace- increased Nepro- started calorie count - 4/20> she is wanting a feeding tube-- s/p PEG tube  placed By Dr Allen Norris on 4/22 - TF started tolerating well  -We will need to follow for re-feeding syndrome afterwards-pt's dter [name] is aware -electrolytes repeated  Acute hypoxic respiratory failure - 4/15> on 3-4 L of O 2 with pulse ox in mid 90s - RR in high 20-low 30 range - fluid removed with dialysis on 4/16- no longer hypoxic   -prn oxygen  ESRD on HD - infected cath removed and tunnelled HD cath placed on 4/12 -cont dialysis per renal team - MWF dialysis with [facility]  HTN - meds being titrated by Renal  Acute anemia (possible related to infection/ acute illness) in addition to CKD - has been  transfused 3 U PRBC so far - 4/3 (1 U) and 4/12 ( 2 U)   Diabetes Mellitus 2 with ESRD  - A1c checked on 4/3 was 5.6 and she was not on medications as outpt -now in TF   Status post CVA - bed bound with left sided weakness (0/5) and left facial droop - resume plavix - cont Statin  Seizure disorder - cont Keppra  Mild thrombocytopenia - possible due to sepsis- follow  Depression - has a flat affect, poor appetite - cont Remeron and Zoloft  Pt was seen by Palliative care on 05/12/2019--Family wants everything done.   DVT prophylaxis: SCDs Code Status: Full code Family Communication: speaking with daughter [name] Tackitt Disposition Plan: return to SNF eventually  -pt is s/p PEG placement. TF to be started today - will watch for re-feeding syndrome for couple days Consultants: Nephro,Cardiology,GI,ID,Palliative care Procedures:  4/14- EGDBenign-appearing esophageal stenosis 4/12- left IJ  2 D ECHO 4/22 PEG tube  Status is: Inpatient Dispo: The patient is from:rehab              Anticipated d/c is to her SNF              Anticipated d/c date is: likley Monday 4/26--after PEG placed need to monitor of refeeding syndrome for few days.  TOC aware for d/c date Pt's dter Robin aware of the plan             Patient currently best at baseline. Multiple co-morbidities. Long term prognosis poor  TOTAL TIME TAKING CARE OF THIS PATIENT: *25* minutes.  >50% time spent on counselling and coordination of care  Note: This dictation was prepared with Dragon dictation along with smaller phrase technology. Any transcriptional errors that result from this process are unintentional.  Fritzi Mandes M.D    [facility]   CC: Primary care physician; Marisa Hua, MDPatient ID: Sherie Don, female   DOB: 05/27/1945, 74 y.o.   MRN: [id number]

## 2019-05-31 NOTE — Consult Note (Addendum)
Pharmacy Antibiotic Note  Crystal Vargas is a 74 y.o. female admitted on 06/01/2019 with MRSA bacteremia due to an infected HD catheter.  Pharmacy has been consulted for Daptomycin and Aztreonam.  Patient receives hemodialysis on Monday, Wednesday and Friday. Dr Lucky Cowboy placed a PermCath 4/12. TEE is negative for vegetation. ID recommends completion of IV antibiotics through 06/20/19.   Plan: Daptomycin:   Continue daptomycin 500 mg (~8 mg/kg) IV q48 hours  CK once weekly: next 4/28 (range since admission: 10 - 166 U/L): atorvastatin has been held by attending physician due to uptrend in CK  Ceftaroline   Continue ceftaroline 200 mg q8H for MRSA bacteremia dosing in HD patients.   Aztreonam  Will start patient on Azactam 500mg  Q12 hours(HD dosing)   End date 5/14 for both Daptomycin and Ceftaroline per ID.   Pharmacy will continue to dose and adjust per consult.   Height: 5\' 2"  (157.5 cm) Weight: 115.7 kg (255 lb) IBW/kg (Calculated) : 50.1  Temp (24hrs), Avg:98.1 F (36.7 C), Min:98.1 F (36.7 C), Max:98.1 F (36.7 C)  Recent Labs  Lab 05/23/2019 0654 05/31/19 2025  WBC  --  13.4*  CREATININE 2.54* 2.18*    Estimated Creatinine Clearance: 26.4 mL/min (A) (by C-G formula based on SCr of 2.18 mg/dL (H)).    Antimicrobials this admission:  Azactam/Flagyl 4/24 >> vancomycin 4/3 >> 4/8 aztreonam 4/3 x 1 daptomycin 4/7 >> ceftaroline 4/12 >>  Microbiology results: 4/9 BCx: NG final 4/7 BCx: S aureus 4/7 Cath Tip: no growth final  4/6 Bcx: GPC 1/4 (anaerobic bottle) 4/4 Bcx:  Staph aureus (A-line) 4/5 Bcx:  Staph aureus 4/3 BCx: MRSA 4/4 Cath tip:  MRSA 4/3 UCx: Kleb - Likely contamination  4/3 MRSA PCR: negative 4/3 Flu A&B, Covid (nasopharyngeal swab):  negative  Thank you for allowing pharmacy to be a part of this patient's care.  Crystal Vargas 05/31/2019 9:42 PM

## 2019-05-31 NOTE — Consult Note (Signed)
Name: Crystal Vargas MRN: [id number] DOB: 1945/12/02    ADMISSION DATE:  05/28/2019 CONSULTATION DATE:  05/31/2019  REFERRING MD :  Sharion Settler, NP  CHIEF COMPLAINT:  Altered mental status  BRIEF PATIENT DESCRIPTION:  74 y.o. Female initially admitted 05/17/2019 with MRSA bacteremia & septic shock due to infected Hemodialysis catheter.  Catheter removed, and ID following.  Receiving Daptomycin & Ceftaroline.  On 4/24 developed Acute Metabolic Encephalopathy & Acute Hypoxic Hypercapnic Respiratory Failure secondary to Acute Decompensated HFrEF and questionable HCAP requiring BiPAP. Questionable Refeeding Syndrome.  SIGNIFICANT EVENTS  4/3: Admission to ICU 4/4: Infected dialysis catheter removed 4/12: New Left IJ perm cath placed by Vascular Surgery 4/14: EGD revealed esophageal stenosis, dilation performed 4/20: TEE with no vegetations 4/21: Placement of tunneled single lumen Hickman catheter for long term abx 4/22: PEG tube placed 4/24: AMS and Acute hypoxic hypercapnic respiratory failure requiring transfer to ICU for BiPAP; PCCM consulted. 4/25: Did not tolerate MRI, became bradycardic (HR 30's) and hypoxic when laid flat on table 4/25: More awake and alert s/p BiPAP   STUDIES:  CT Head 06/02/2019>>1. No acute intracranial abnormality. 2. Large remote right MCA distribution infarct with ex vacuo dilatation of the right lateral ventricle. Background chronic small vessel ischemia. CT Cervial Spine 06/05/2019>>1. Degenerative change in the cervical spine without acute fractureor subluxation. 2. Patulous esophagus containing intraluminal fluid/debris. 2D Echocardiogram 05/11/19>> EF 45 to 50% with moderate left ventricular hypertrophy and grade I diastolic dysfunction TEE 05/16/79>> No vegetations CT Head 05/31/19>> 1. Generalized cerebral atrophy. 2. Large, chronic right parietal lobe infarct. 3. No acute intracranial abnormality. MR Brain 06/01/19>>1. Technically limited exam with only axial  DWI sequence performed. Patient's vital signs became unstable during the course of the study, which as a result was terminated early. 2. No evidence for acute or subacute infarct. No other definite acute intracranial abnormality on this limited study. 3. Chronic right MCA territory infarct.  CULTURES: 4/9 BCx: NG final 4/7 BCx: S aureus 4/7 Cath Tip: no growth final  4/6 Bcx: GPC 1/4 (anaerobic bottle) 4/4 Bcx:  Staph aureus (A-line) 4/5 Bcx:  Staph aureus 4/3 BCx: MRSA 4/4 Cath tip:  MRSA 4/3 UCx: Kleb - Likely contamination  4/3 MRSA PCR: negative 4/3 Flu A&B, Covid (nasopharyngeal swab):  negative  ANTIBIOTICS: Azactam/Flagyl 4/24 >> vancomycin 4/3 >> 4/8 aztreonam 4/3 x 1; 4/24>> daptomycin 4/7 >> ceftaroline 4/12 >>  HISTORY OF PRESENT ILLNESS:   Patient is a 74 year old female with a past medical history significant for lifelong never smoker who has been followed by [facility] nephrology and initiated dialysis in January (M,W,F), 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  She has chronic issues with dysphagia due to Schatzki's ring and has been evaluated by GI and has had esophageal dilatation in the past. She presented to [facility] ED on 05/13/2019 from her skilled nursing facility due to altered mental status. The patient cannot provide reliable history due to altered mental status.  Daughter who is present with her states that she has had poor p.o. intake due to dysphagia.   Per the facility where the patient resides the patient had an abrupt decline in mental status and EMS was activated.  EMS found the patient to be hypotensive.  Evaluation by the ED physicians showed the patient to be with altered mental status and moaning.  At the time of my evaluation the patient is more responsive but only answering yes or no questions  and not elaborating on history.  Evaluation in the emergency room shows that the patient has profound  hypokalemia at 2.1, profound hypoalbuminemia at 1.3 and significant anemia with hemoglobin of 6.9 and hematocrit of 21.4.  There is also thrombocytopenia with platelets of 82,000.  She has poor IV access.  Patient is responding to volume resuscitation however has had to have pressor started peripherally.  PCCM was asked to admit the patient for further management.  She was found to have MRSA BACTEREMIA, septic shock, and acute metabolic encephalopathy secondary to infected Dialysis catheter.  The infected catheter was removed on 4/4. ID consulted and following.  A new left IJ permanent tunneled catheter was placed by Vascular Surgery on 4/12.   She underwent EGD on 4/14 for esophageal stenosis which required dilation. A TEE was performed on 4/20, which revealed no vegetations. On 4/22 she underwent PEG tube placement.  Late in the evening on 4/24, she developed altered mental status, as she was more lethargic, moaning, with indiscernible speech. Right pupil was 3 mm and left pupil 2 mm and brisk.  She was initially hypoxic with O2 saturations in the 80's, which quickly resolved with placement of supplemental O2..  CXR shows bilateral pleural effusions with diffuse vascular congestion/edema, along with bibasilar atelectlestis vs. Pneumonia.  ABG with respiratory acidosis (pH 7.25 / pCO2 70 / pO2 191).  Labs revealed glucose 460, Creatinine 2.18, phosphorus 4.8, magnesium 1.9, lipase 117, amylase 133, serum osmolality 318, beta-hydroxybutric acid 0.18, WBC 13.4, Procalcitonin 0.45, lactic acid 3.8, hemoglobin 8.1, and platelets 135.  Rapid response was called, she was placed on BiPAP, and transferred to ICU.  CT Head is negative for any acute intracranial abnormalities.    PCCM is consulted for further management of Acute Metabolic Encephalopathy & Acute Hypoxic Hypercapnic Respiratory Failure secondary to Acute Decompensated HFrEF and questionable HCAP requiring BiPAP. Questionable Refeeding  Syndrome.   PAST MEDICAL HISTORY :   has a past medical history of Chronic kidney disease, Coronary artery disease, Depression, Diabetes mellitus without complication ([place]), GERD (gastroesophageal reflux disease), Glaucoma, Hyperlipidemia, Hypertension, and Stroke ([place]).  has a past surgical history that includes Abdominal hysterectomy; Esophagogastroduodenoscopy (egd) with propofol (N/A, 01/21/2018); TEMPORARY DIALYSIS CATHETER (N/A, 05/14/2019); DIALYSIS/PERMA CATHETER INSERTION (N/A, 06/04/2019); Esophagogastroduodenoscopy (N/A, 05/24/2019); TEE without cardioversion (N/A, 05/18/2019); TEE without cardioversion (N/A, 06/04/2019); CENTRAL LINE INSERTION-TUNNELED (Right, 05/16/2019); and PEG placement (N/A, 06/05/2019). Prior to Admission medications   Medication Sig Start Date End Date Taking? Authorizing Provider  acetaminophen (TYLENOL) 325 MG tablet Take 650 mg by mouth 3 (three) times daily.    Yes [provider]  albuterol (PROVENTIL) (2.5 MG/3ML) 0.083% nebulizer solution Take 2.5 mg by nebulization 2 (two) times daily as needed for wheezing or shortness of breath.   Yes [provider]  allopurinol (ZYLOPRIM) 100 MG tablet Take 100 mg by mouth every evening.    Yes [provider]  amLODipine (NORVASC) 10 MG tablet Take 10 mg by mouth every evening.    Yes [provider]  atorvastatin (LIPITOR) 10 MG tablet Take 10 mg by mouth every evening.    Yes [provider]  brimonidine-timolol (COMBIGAN) 0.2-0.5 % ophthalmic solution Place 1 drop into both eyes every 12 (twelve) hours.   Yes [provider]  carvedilol (COREG) 25 MG tablet Take 25 mg by mouth See admin instructions. Take 25 mg twice daily on Sunday, Tuesday, Thursday, Saturday   Yes [provider]  carvedilol (COREG) 25 MG tablet Take 25 mg by  mouth See admin instructions. Take 25 mg in the evening on Monday, Wednesday, Friday   Yes [provider]  cholecalciferol  (VITAMIN D3) 25 MCG (1000 UNIT) tablet Take 1,000 Units by mouth daily.   Yes [provider]  clopidogrel (PLAVIX) 75 MG tablet Take 75 mg by mouth every evening.    Yes [provider]  diclofenac Sodium (VOLTAREN) 1 % GEL Apply 2 g topically in the morning and at bedtime. To neck   Yes [provider]  guaiFENesin (ROBITUSSIN) 100 MG/5ML SOLN Take 5 mLs by mouth at bedtime as needed for cough or to loosen phlegm.   Yes [provider]  hydrALAZINE (APRESOLINE) 25 MG tablet Take 25 mg by mouth See admin instructions. Take 25 mg three times daily on Sun, Tue, Thu, Sat, give twice daily on Mon, Wed, Fri after dialysis   Yes [provider]  isosorbide dinitrate (ISORDIL) 10 MG tablet Take 10 mg by mouth 3 (three) times daily.   Yes [provider]  latanoprost (XALATAN) 0.005 % ophthalmic solution Place 1 drop into both eyes every evening.    Yes [provider]  levETIRAcetam (KEPPRA) 1000 MG tablet Take 1,000 mg by mouth every evening.   Yes [provider]  lisinopril (ZESTRIL) 2.5 MG tablet Take 2.5 mg by mouth every evening.    Yes [provider]  melatonin 3 MG TABS tablet Take 3 mg by mouth at bedtime.   Yes [provider]  midodrine (PROAMATINE) 10 MG tablet Take 10 mg by mouth every Monday, Wednesday, and Friday.   Yes [provider]  mirtazapine (REMERON) 7.5 MG tablet Take 7.5 mg by mouth at bedtime.   Yes [provider]  pantoprazole (PROTONIX) 40 MG tablet Take 40 mg by mouth every evening.    Yes [provider]  Propylene Glycol-Glycerin (ARTIFICIAL TEARS) 1-0.3 % SOLN Place 2 drops into both eyes in the morning and at bedtime.   Yes [provider]  sertraline (ZOLOFT) 50 MG tablet Take 50 mg by mouth daily.   Yes [provider]  Wheat Dextrin (BENEFIBER DRINK MIX PO) Take 1 Scoop by mouth daily.   Yes [provider]  ascorbic acid  (VITAMIN C) 500 MG tablet Take 1 tablet (500 mg total) by mouth 2 (two) times daily. 05/12/2019   Debbe Odea, MD  megestrol (MEGACE) 400 MG/10ML suspension Take 10 mLs (400 mg total) by mouth 2 (two) times daily. 06/06/2019   Debbe Odea, MD  midodrine (PROAMATINE) 5 MG tablet Take 1 tablet (5 mg total) by mouth every Monday, Wednesday, and Friday. 05/24/2019   Debbe Odea, MD  multivitamin (RENA-VIT) TABS tablet Take 1 tablet by mouth at bedtime. 05/10/2019   Debbe Odea, MD  Nutritional Supplements (FEEDING SUPPLEMENT, NEPRO CARB STEADY,) LIQD Take 237 mLs by mouth 4 (four) times daily. 05/17/2019   Debbe Odea, MD  polyethylene glycol (MIRALAX / GLYCOLAX) 17 g packet Take 17 g by mouth daily. 06/04/2019   Debbe Odea, MD   Allergies  Allergen Reactions  . Abacavir Other (See Comments)  . Cephalosporins Other (See Comments)    Tolerated ceftaroline April 2021  . Ciprofloxacin Other (See Comments)  . Nsaids Other (See Comments)  . Penicillin G Other (See Comments)  . Quinolones Other (See Comments)  . Salicylates Other (See Comments)    FAMILY HISTORY:  family history is not on file. SOCIAL HISTORY:  reports that she has never smoked. She has never used  smokeless tobacco. She reports previous alcohol use. She reports that she does not use drugs.   COVID-19 DISASTER DECLARATION:  FULL CONTACT PHYSICAL EXAMINATION WAS NOT POSSIBLE DUE TO TREATMENT OF COVID-19 AND  CONSERVATION OF PERSONAL PROTECTIVE EQUIPMENT, LIMITED EXAM FINDINGS INCLUDE-  Patient assessed or the symptoms described in the history of present illness.  In the context of the Global COVID-19 pandemic, which necessitated consideration that the patient might be at risk for infection with the SARS-CoV-2 virus that causes COVID-19, Institutional protocols and algorithms that pertain to the evaluation of patients at risk for COVID-19 are in a state of rapid change based on information released by regulatory bodies including the  CDC and federal and state organizations. These policies and algorithms were followed during the patient's care while in hospital.  REVIEW OF SYSTEMS:   Unable to assess due to AMS  SUBJECTIVE:  Unable to assess due to AMS  VITAL SIGNS: Temp:  [98.1 F (36.7 C)] 98.1 F (36.7 C) (04/24 0457) Pulse Rate:  [61-74] 68 (04/24 1743) Resp:  [17] 17 (04/24 0457) BP: (124-153)/(49-62) 153/61 (04/24 1743) SpO2:  [100 %] 100 % (04/24 1146) Weight:  [115.7 kg] 115.7 kg (04/24 0611)  PHYSICAL EXAMINATION: General:  Acute on chronically ill appearing female, laying in bed, on BiPAP, in NAD Neuro:  Lethargic, arouses to voice, able to follow commands with RUE (baseline left sided hemiparesis), moans HEENT:  Atraumatic, normocephalic, neck supple, no JVD Cardiovascular:  Bradycardia, regular rhythm, s1s2, no M/R/G Lungs:  Scattered rhonchi bilateral upper fields, diminished to auscultation bilateral bases, BiPAP assisted, even, nonlabored Abdomen:  Obese, soft, nontender, nondistended, no guarding or rebound tenderness; PEG site clean dry and intact Musculoskeletal:  Left hemiparesis at basline Skin:  Warm and dry.  Pressure injury to left medial heel, left great toe, sacral decubitus ulcer  Recent Labs  Lab 05/22/2019 0654 05/31/19 0454 05/31/19 2025  NA 141  --  137  K 3.5 3.2* 3.7  CL 109  --  104  CO2 27  --  27  BUN 17  --  18  CREATININE 2.54*  --  2.18*  GLUCOSE 130*  --  460*   Recent Labs  Lab 05/31/19 2025  HGB 8.1*  HCT 26.2*  WBC 13.4*  PLT 135*   DG Chest 1 View  Result Date: 05/31/2019 CLINICAL DATA:  74 year old female with shortness of breath. Possible aspiration. EXAM: CHEST  1 VIEW COMPARISON:  Chest radiograph dated 05/25/2019. FINDINGS: Left IJ dialysis catheter in similar position. Interval placement of a right IJ central venous line with tip over central SVC. There is shallow inspiration with bilateral pleural effusions and bibasilar atelectasis. Diffuse  vascular congestion and edema. Pneumonia is not excluded. Clinical correlation is recommended. No pneumothorax. There is atherosclerotic calcification of the aorta. No acute osseous pathology. IMPRESSION: 1. Bilateral pleural effusions with bibasilar atelectasis. 2. Diffuse vascular congestion and edema. Pneumonia is not excluded. Electronically Signed   By: Anner Crete M.D.   On: 05/31/2019 21:06   CT HEAD WO CONTRAST  Result Date: 05/31/2019 CLINICAL DATA:  Altered mental status. EXAM: CT HEAD WITHOUT CONTRAST TECHNIQUE: Contiguous axial images were obtained from the base of the skull through the vertex without intravenous contrast. COMPARISON:  May 10, 2019 FINDINGS: Brain: There is mild cerebral atrophy with widening of the extra-axial spaces and ventricular dilatation. There are areas of decreased attenuation within the white matter tracts of the supratentorial brain, consistent with microvascular disease changes. A large area of  cortical encephalomalacia, with adjacent chronic white matter low attenuation, is seen within the right parietal lobe. Associated ex vacuo dilatation of the right lateral ventricle is seen. These findings are present on the prior exam. Vascular: No hyperdense vessel or unexpected calcification. Skull: Normal. Negative for fracture or focal lesion. Sinuses/Orbits: There is mild sphenoid sinus mucosal thickening. Other: None. IMPRESSION: 1. Generalized cerebral atrophy. 2. Large, chronic right parietal lobe infarct. 3. No acute intracranial abnormality. Electronically Signed   By: Virgina Norfolk M.D.   On: 05/31/2019 21:34    ASSESSMENT / PLAN:  Acute Metabolic Encephalopathy, likely secondary to Hypercapnia  Hx: Right MCA stroke with left hemiparesis, bed-bound, dsyphagia, Seizure disorder -CT Head 4/24 negative for any acute abnormality -MR Brain 4/24  Terminated early as pt was unable to tolerate lying flat as she became bradycardic (HR 30's) and hypoxic when  placed on MRI table ~ MR didn't reveal anything acute -BiPAP, follow up ABG -Ammonia mildly elevated at 42 -Provide supportive care  Acute Hypoxic Hypercapnic Respiratory Failure in setting of Acute Decompensated HFrEF & ? HCAP vs. Atelectasis -Supplemental O2 as needed to maintain O2 sats >92% -BiPAP, wean as tolerated -High risk for intubation -Follow intermittent CXR & ABG as needed -PRN Bronchodilators -Volume removal with Hemodialysis -Antibiotics as above  Hyperglycemia (doesn't quite meet criteria HHNK & doesn't meet criteria for DKA) Hx: DM -CBG's -Received 20 units Novolog prior to transfer to ICU -SSI q4h -Follow ICU Hypo/hyperglycemia protocol  ? Refeeding Syndrome Mildly elevated Lipase & Amylase -Tube feeds currently on hold -Monitor serum electrolytes ~ Electrolytes currently acceptable -Trend Lipase/Amylase -If Lipase/Amylase continue to trend upward, will need CT Abdomen or Ultrasound  MRSA Bacteremia & Septic shock due to infected Dialysis catheter>>resolved ? HCAP -Monitor fever curve -Trend WBC's & Procalcitonin -Follow cultures as above -ID following -Continue Daptomycin & Teflaro per ID -Repeat BC 4/9 negative -TEE on 4/20 negative for vegetations -Will add Aztreonam for possible HCAP (PCN allergy)  ESRD on HD -Monitor I&O's / urinary output -Follow BMP -Ensure adequate renal perfusion -Avoid nephrotoxic agents as able -Replace electrolytes as indicated -Nephrology following, appreciate input ~ HD as per Nephrology  Acute anemia (possibly related to infection/acute illness) in addition to CKD Mild Thrombocytopenia, likely in setting of sepsis -Monitor for S/Sx of bleeding -Trend CBC -SCD's for VTE Prophylaxis  -Transfuse for Hgb <7 -Transfuse platelets for platelet count <50 with active bleeding  Dysphagia Poor PO intake -NPO -PEG placed     BEST PRACTICES: DISPOSITION: ICU GOALS OF CARE: Full Code VTE PROPHYLAXIS: SCD's UPDATES:  Updated pt's son at bedside 06/01/19  Darel Hong, AGACNP-BC [place] Pager: [phone]  05/31/2019, 9:43 PM

## 2019-05-31 NOTE — Anesthesia Postprocedure Evaluation (Signed)
Anesthesia Post Note  Patient: Crystal Vargas  Procedure(s) Performed: PERCUTANEOUS ENDOSCOPIC GASTROSTOMY (PEG) PLACEMENT (N/A )  Patient location during evaluation: Endoscopy Anesthesia Type: General Level of consciousness: awake and alert Pain management: pain level controlled Vital Signs Assessment: post-procedure vital signs reviewed and stable Respiratory status: spontaneous breathing, nonlabored ventilation, respiratory function stable and patient connected to nasal cannula oxygen Cardiovascular status: blood pressure returned to baseline and stable Postop Assessment: no apparent nausea or vomiting Anesthetic complications: no     Last Vitals:  Vitals:   05/30/19 1350 05/31/19 0457  BP: 130/70 140/62  Pulse: 79 63  Resp: (!) 21 17  Temp: 36.7 C 36.7 C  SpO2: 100% 100%    Last Pain:  Vitals:   05/31/19 0457  TempSrc: Oral  PainSc:                  Alphonsus Sias

## 2019-05-31 NOTE — Progress Notes (Signed)
  Rapid response called on patient secondary to altered  Ental status reported to nurse by son.   Son informed me that patient normally conversive and oriented.  Today she had been sleeping most of the day and not talking. Then this evening she went to talk to him and her speech was undiscernible.  Nurse reports stable HR and BP but sats in 80's on her initial eval, quickly recovered 100% with supplemental nasal cannula oxygen.  Respiration pattern deep.,  Non responsive to painful stimuli. CBG 430 Exxam - Initial non responsive to noxious stimuli.  Slowly responded to with left fott dorsiflexion inner thigh pinch, right foot slight plantar flexion with inner thigh pinch.  No upper extremity movement wit inner arm pinch or nailbed pressure.  Did have verbal pain response then to right arm passive range but not in right.  Right pupil 3 non reactive, left pupil 2 and brisk. Unable to auscultate heart sounds but SR/SB on monitor and BP stable BBS - diminished bilateral bases, some upper intermittent sonorous rhonchi. RUL Abdomen soft, no pain behaviors with palpation - PEG without complications     Significant labs/ DX and Plan Sepsis - Acute respiratory failure with hypercapnia CXR - IMPRESSION: 1. Bilateral pleural effusions with bibasilar atelectasis. 2. Diffuse vascular congestion and edema. Pneumonia is not excluded.  Procalcitonin   ABG - respiratory acidosis with pH 7.25, pCO2 70, PO2 191 - Bipap ordered Bipap - discussed with son purpose and benefits of bipap therapy as well as the potential need for intubation and mechanical ventilation should she not improve or worsen  Consult ICU team  HCAP - Antimicrobial per ICU team  Lactic 3.8 - blood cultures not ordered as MRSA bacteremia known   Encephalopathy - multifactorial  Head CT without contrast IMPRESSION: 1. Generalized cerebral atrophy. 2. Large, chronic right parietal lobe infarct. 3. No acute intracranial  abnormality. Continue plavix, not on statin F/u CT with MRI  Ammonia level - 42    Refeeding syndrome - suspect with elevated glucose and mild elevation in lipase. Continue to trend lipase.  Beta hydroxybutyric acid 0.18 - CBG q 4h with high dose sliding scale  Trend lipase - CT or ultrasound to eval pancreas if lipase continues to rise  Hyperglycemia - no evidence of DKA 20 units novolog  given, started q4h high dose SSI. Likely hyperglycemia part of refeeding syndrome. Does have hx of T2DM, likely no longer with need for therapy prior to admission as she had poor oral intake.  Seizure disorder - continue current anti seizure meds, no evidence or reported seizure activity

## 2019-05-31 NOTE — Progress Notes (Signed)
2000 Rapid called for AMS, on arrival to bedside BP 118/49, SB 58. Respirations appear agonal, Patient responds to deep sternal rub, unable to follow commands. NP Randol Kern at bedside orders received for transfer to ICU. See CHL for further assessment.

## 2019-05-31 NOTE — Progress Notes (Addendum)
Pt's son came to hallway to alert writer that pt seemed like she was trying to talk but he couldn't understand her. Writer went to pt's room and pt lying in bed making groaning sounds. Pt asked if she is in pain but pt unable to have any clear communication and pt's eyes rolling back. Pupils dilated. Pt asked to squeeze writer's fingers with her hands, pt able to squeeze with right hand but no movement with left hand. Pt asked to push back on writers hands with her feet but pt unable to perform task. Pt responds to painful stimuli. Secretary asked to call a rapid response. BS taken along with vitals. Labs and tests ordered via the NP. Orders and results in chart.

## 2019-06-01 ENCOUNTER — Inpatient Hospital Stay: Payer: Medicare Other

## 2019-06-01 DIAGNOSIS — D649 Anemia, unspecified: Secondary | ICD-10-CM | POA: Diagnosis not present

## 2019-06-01 DIAGNOSIS — N186 End stage renal disease: Secondary | ICD-10-CM | POA: Diagnosis not present

## 2019-06-01 DIAGNOSIS — G9341 Metabolic encephalopathy: Secondary | ICD-10-CM | POA: Diagnosis not present

## 2019-06-01 DIAGNOSIS — R7881 Bacteremia: Secondary | ICD-10-CM | POA: Diagnosis not present

## 2019-06-01 LAB — COMPREHENSIVE METABOLIC PANEL
ALT: 19 U/L (ref 0–44)
AST: 24 U/L (ref 15–41)
Albumin: 1.6 g/dL — ABNORMAL LOW (ref 3.5–5.0)
Alkaline Phosphatase: 74 U/L (ref 38–126)
Anion gap: 8 (ref 5–15)
BUN: 20 mg/dL (ref 8–23)
CO2: 25 mmol/L (ref 22–32)
Calcium: 8.5 mg/dL — ABNORMAL LOW (ref 8.9–10.3)
Chloride: 108 mmol/L (ref 98–111)
Creatinine, Ser: 2.18 mg/dL — ABNORMAL HIGH (ref 0.44–1.00)
GFR calc Af Amer: 25 mL/min — ABNORMAL LOW (ref >60)
GFR calc non Af Amer: 21 mL/min — ABNORMAL LOW (ref >60)
Glucose, Bld: 200 mg/dL — ABNORMAL HIGH (ref 70–99)
Potassium: 3.2 mmol/L — ABNORMAL LOW (ref 3.5–5.1)
Sodium: 141 mmol/L (ref 135–145)
Total Bilirubin: 0.6 mg/dL (ref 0.3–1.2)
Total Protein: 5.6 g/dL — ABNORMAL LOW (ref 6.5–8.1)

## 2019-06-01 LAB — GLUCOSE, CAPILLARY
Glucose-Capillary: 191 mg/dL — ABNORMAL HIGH (ref 70–99)
Glucose-Capillary: 80 mg/dL (ref 70–99)
Glucose-Capillary: 28 mg/dL — CL (ref 70–99)
Glucose-Capillary: 85 mg/dL (ref 70–99)
Glucose-Capillary: 81 mg/dL (ref 70–99)
Glucose-Capillary: 62 mg/dL — ABNORMAL LOW (ref 70–99)
Glucose-Capillary: 94 mg/dL (ref 70–99)
Glucose-Capillary: 75 mg/dL (ref 70–99)
Glucose-Capillary: 68 mg/dL — ABNORMAL LOW (ref 70–99)
Glucose-Capillary: 111 mg/dL — ABNORMAL HIGH (ref 70–99)
Glucose-Capillary: 108 mg/dL — ABNORMAL HIGH (ref 70–99)
Glucose-Capillary: 101 mg/dL — ABNORMAL HIGH (ref 70–99)
Glucose-Capillary: 98 mg/dL (ref 70–99)

## 2019-06-01 LAB — BLOOD GAS, ARTERIAL
Acid-base deficit: 0.5 mmol/L (ref 0.0–2.0)
Bicarbonate: 23.1 mmol/L (ref 20.0–28.0)
Delivery systems: POSITIVE
Expiratory PAP: 6
FIO2: 0.21
Inspiratory PAP: 14
O2 Saturation: 92.6 %
Patient temperature: 37
pCO2 arterial: 34 mmHg (ref 32.0–48.0)
pH, Arterial: 7.44 (ref 7.350–7.450)
pO2, Arterial: 63 mmHg — ABNORMAL LOW (ref 83.0–108.0)

## 2019-06-01 LAB — CBC WITH DIFFERENTIAL/PLATELET
Abs Immature Granulocytes: 0.22 10*3/uL — ABNORMAL HIGH (ref 0.00–0.07)
Basophils Absolute: 0 10*3/uL (ref 0.0–0.1)
Basophils Relative: 0 %
Eosinophils Absolute: 0.1 10*3/uL (ref 0.0–0.5)
Eosinophils Relative: 0 %
HCT: 25.5 % — ABNORMAL LOW (ref 36.0–46.0)
Hemoglobin: 7.6 g/dL — ABNORMAL LOW (ref 12.0–15.0)
Immature Granulocytes: 2 %
Lymphocytes Relative: 7 %
Lymphs Abs: 0.9 10*3/uL (ref 0.7–4.0)
MCH: 29.5 pg (ref 26.0–34.0)
MCHC: 29.8 g/dL — ABNORMAL LOW (ref 30.0–36.0)
MCV: 98.8 fL (ref 80.0–100.0)
Monocytes Absolute: 0.7 10*3/uL (ref 0.1–1.0)
Monocytes Relative: 5 %
Neutro Abs: 10.1 10*3/uL — ABNORMAL HIGH (ref 1.7–7.7)
Neutrophils Relative %: 86 %
Platelets: 115 10*3/uL — ABNORMAL LOW (ref 150–400)
RBC: 2.58 MIL/uL — ABNORMAL LOW (ref 3.87–5.11)
RDW: 19.3 % — ABNORMAL HIGH (ref 11.5–15.5)
WBC: 11.9 10*3/uL — ABNORMAL HIGH (ref 4.0–10.5)
nRBC: 0 % (ref 0.0–0.2)

## 2019-06-01 LAB — CBC
HCT: 28.1 % — ABNORMAL LOW (ref 36.0–46.0)
Hemoglobin: 8.5 g/dL — ABNORMAL LOW (ref 12.0–15.0)
MCH: 30 pg (ref 26.0–34.0)
MCHC: 30.2 g/dL (ref 30.0–36.0)
MCV: 99.3 fL (ref 80.0–100.0)
Platelets: 114 10*3/uL — ABNORMAL LOW (ref 150–400)
RBC: 2.83 MIL/uL — ABNORMAL LOW (ref 3.87–5.11)
RDW: 19.3 % — ABNORMAL HIGH (ref 11.5–15.5)
WBC: 13.1 10*3/uL — ABNORMAL HIGH (ref 4.0–10.5)
nRBC: 0 % (ref 0.0–0.2)

## 2019-06-01 LAB — AMYLASE: Amylase: 136 U/L — ABNORMAL HIGH (ref 28–100)

## 2019-06-01 LAB — AMMONIA: Ammonia: 42 umol/L — ABNORMAL HIGH (ref 9–35)

## 2019-06-01 LAB — LACTIC ACID, PLASMA
Lactic Acid, Venous: 3.8 mmol/L (ref 0.5–1.9)
Lactic Acid, Venous: 4.9 mmol/L (ref 0.5–1.9)

## 2019-06-01 LAB — PHOSPHORUS: Phosphorus: 3.9 mg/dL (ref 2.5–4.6)

## 2019-06-01 LAB — LIPASE, BLOOD: Lipase: 105 U/L — ABNORMAL HIGH (ref 11–51)

## 2019-06-01 LAB — MAGNESIUM: Magnesium: 1.8 mg/dL (ref 1.7–2.4)

## 2019-06-01 MED ORDER — INSULIN ASPART 100 UNIT/ML ~~LOC~~ SOLN
0.0000 [IU] | SUBCUTANEOUS | Status: DC
  Administered 2019-06-02: 1 [IU] via SUBCUTANEOUS
  Administered 2019-06-02 – 2019-06-03 (×3): 2 [IU] via SUBCUTANEOUS
  Administered 2019-06-03 (×2): 1 [IU] via SUBCUTANEOUS
  Filled 2019-06-01 (×7): qty 1

## 2019-06-01 MED ORDER — POTASSIUM CHLORIDE 20 MEQ/15ML (10%) PO SOLN
40.0000 meq | Freq: Once | ORAL | Status: AC
  Administered 2019-06-01: 20 meq
  Filled 2019-06-01: qty 30

## 2019-06-01 MED ORDER — DEXTROSE 50 % IV SOLN
25.0000 g | INTRAVENOUS | Status: AC
  Administered 2019-06-01: 12:00:00 25 g via INTRAVENOUS

## 2019-06-01 MED ORDER — DEXTROSE 10 % IV SOLN: INTRAVENOUS | Status: DC

## 2019-06-01 MED ORDER — DEXTROSE 50 % IV SOLN
INTRAVENOUS | Status: AC
  Filled 2019-06-01: qty 50

## 2019-06-01 MED ORDER — INSULIN ASPART 100 UNIT/ML ~~LOC~~ SOLN: 0.0000 [IU] | Freq: Three times a day (TID) | SUBCUTANEOUS | Status: DC

## 2019-06-01 MED ORDER — ACETAMINOPHEN 160 MG/5ML PO SOLN
650.0000 mg | Freq: Four times a day (QID) | ORAL | Status: DC | PRN
  Administered 2019-06-01 – 2019-06-02 (×3): 650 mg
  Filled 2019-06-01 (×6): qty 20.3

## 2019-06-01 MED ORDER — DEXTROSE 50 % IV SOLN
12.5000 g | INTRAVENOUS | Status: AC
  Administered 2019-06-01: 12.5 g via INTRAVENOUS

## 2019-06-01 MED ORDER — INSULIN ASPART 100 UNIT/ML ~~LOC~~ SOLN: 0.0000 [IU] | Freq: Every day | SUBCUTANEOUS | Status: DC

## 2019-06-01 NOTE — Progress Notes (Signed)
PT Cancellation Note  Patient Details Name: Crystal Vargas MRN: [id number] DOB: [birth year]   Cancelled Treatment:    Reason Eval/Treat Not Completed: Medical issues which prohibited therapy(Per chart review, patient noted with transfer to higher level of care due to change in medical condition.  Per guidelines, patient requires new orders to resume PT services.  Please reconsult when medically appropriate.)   Miniya Miguez H. Owens Shark, PT, DPT, NCS 06/01/19, 8:56 PM [phone]

## 2019-06-01 NOTE — Progress Notes (Addendum)
Shift summary:  - Patient remains on continuous BiPAP this AM.  - Alert and following commands, however, obtunded.  - Rectal tube placed to contain loose, watery stools as groin and buttocks have suffered chemical burning from incontinence.  - Unstable temps today - placed on Bair Hugger.  - Unstable CBGs - placed on D10W infusion + still requiring coverage with D50W PRN.

## 2019-06-01 NOTE — Consult Note (Signed)
PHARMACY CONSULT NOTE - FOLLOW UP  Pharmacy Consult for Electrolyte Monitoring and Replacement   Recent Labs: Potassium (mmol/L)  Date Value  06/01/2019 3.2 (L)   Magnesium (mg/dL)  Date Value  06/01/2019 1.8   Calcium (mg/dL)  Date Value  06/01/2019 8.5 (L)   Albumin (g/dL)  Date Value  06/01/2019 1.6 (L)   Phosphorus (mg/dL)  Date Value  06/01/2019 3.9   Sodium (mmol/L)  Date Value  06/01/2019 141     Assessment: 74 y.o.femalewho is bed bound and from SNF with history ofend-stage renal disease recently started on hemodialysis, history of right MCA stroke, history of dysphagia due to Schatzki's ring and is status post esophageal dilatation who was admitted with altered mental status. Risk for refeeding.   4/23: Will replace with Mg 2 g IV x 1 and potassium phosphate 10 mmol IV x 1. Follow up with electrolytes in with AM labs.  *Addendum: pt received Sodium Phosphate 30 mmol as ordered by nephrologist, instead of Potassium Phosphate.  Goal of Therapy:  WNL  Plan:  K 3.2  Mag 1.8  Phos 3.9   Hemodialysis MWF Will order KCL 40 meq per tube x 1.-conservative since Hemodialysis pt.  Follow up with electrolytes in with AM labs.   Noralee Space ,PharmD Clinical Pharmacist 06/01/2019 9:03 AM

## 2019-06-01 NOTE — Progress Notes (Signed)
Inpatient Diabetes Program Recommendations  AACE/ADA: New Consensus Statement on Inpatient Glycemic Control (2015)  Target Ranges:  Prepandial:   less than 140 mg/dL      Peak postprandial:   less than 180 mg/dL (1-2 hours)      Critically ill patients:  140 - 180 mg/dL   Results for Crystal Vargas, Crystal Vargas (MRN [id number]) as of 06/01/2019 09:42  Ref. Range 05/31/2019 20:15 05/31/2019 20:37 05/31/2019 21:42 05/31/2019 23:34 06/01/2019 04:00 06/01/2019 07:33  Glucose-Capillary Latest Ref Range: 70 - 99 mg/dL 430 (H) 425 (H)  20 units NOVOLOG  403 (H) 358 (H)  20 units NOVOLOG  191 (H)  4 units NOVOLOG  80    Home DM Meds: None listed  Current Orders: Novolog Sensitive Correction Scale/ SSI (0-9 units) TID AC + HS     Getting Osmolite tube feeds 45cc/hr--Tube feeds stopped at 8:30pm last night.  Last Dialysis treatment on 04/23.     MD- Please increase the frequency of pt's Novolog SSi to Q4 hours while NPO (or once tube feeds restart)     --Will follow patient during hospitalization--  Wyn Quaker RN, MSN, CDE Diabetes Coordinator Inpatient Glycemic Control Team Team Pager: [phone] (8a-5p)

## 2019-06-01 NOTE — Consult Note (Signed)
Pharmacy Antibiotic Note  Crystal Vargas is a 74 y.o. female admitted on 05/18/2019 with MRSA bacteremia due to an infected HD catheter.  Pharmacy has been consulted for Daptomycin and Aztreonam.  Patient receives hemodialysis on Monday, Wednesday and Friday. Dr Lucky Cowboy placed a PermCath 4/12. TEE is negative for vegetation. ID recommends completion of IV antibiotics through 06/20/19.   -Aztreonam added 4/24 for ?HCAP.  -Also on Ceftaroline per ID  Plan: Daptomycin:   Continue daptomycin 500 mg (~8 mg/kg) IV q48 hours in HD pt  CK once weekly: next 4/28 (range since admission: 10 - 166 U/L): atorvastatin has been held by attending physician due to uptrend in CK  Ceftaroline   Continue ceftaroline 200 mg q8H for MRSA bacteremia dosing in HD patients.   Aztreonam  Will start patient on Azactam 500mg  Q12 hours(HD dosing)   End date 5/14 for both Daptomycin and Ceftaroline per ID.   Pharmacy will continue to dose and adjust per consult.   Height: 5\' 2"  (157.5 cm) Weight: 115.7 kg (255 lb) IBW/kg (Calculated) : 50.1  Temp (24hrs), Avg:97.9 F (36.6 C), Min:97.7 F (36.5 C), Max:98.1 F (36.7 C)  Recent Labs  Lab 05/08/2019 0654 05/31/19 2025 06/01/19 0015 06/01/19 0344 06/01/19 0742  WBC  --  13.4*  --  11.9* 13.1*  CREATININE 2.54* 2.18*  --  2.18*  --   LATICACIDVEN  --   --  3.8*  --   --     Estimated Creatinine Clearance: 26.4 mL/min (A) (by C-G formula based on SCr of 2.18 mg/dL (H)).    Antimicrobials this admission:  Azactam/Flagyl 4/24 >> vancomycin 4/3 >> 4/8 aztreonam 4/3 x 1 daptomycin 4/7 >> ceftaroline 4/12 >> Aztreonam 4/24 >>  Microbiology results: 4/9 BCx: NG final 4/7 BCx: S aureus 4/7 Cath Tip: no growth final  4/6 Bcx: GPC 1/4 (anaerobic bottle) 4/4 Bcx:  Staph aureus (A-line) 4/5 Bcx:  Staph aureus 4/3 BCx: MRSA 4/4 Cath tip:  MRSA 4/3 UCx: Kleb - Likely contamination  4/3 MRSA PCR: negative 4/3 Flu A&B, Covid (nasopharyngeal swab):   negative  Thank you for allowing pharmacy to be a part of this patient's care.  Crystal Vargas A 06/01/2019 9:06 AM

## 2019-06-01 NOTE — Progress Notes (Signed)
Pt has had AMS pretty much throughout shift. She has had periods of orientation to person and place. 2 daughters have been at bedside all day and still currently. Pt has had to have bear hugger on throughout shift do to hypothermia. Weaned pt off bipap on to 2L [place]. Flexi Seal put in place. Dr. Lanney Gins, nephro MD and Dr. Fritzi Mandes all spoke to both daughters for a plan of care meeting. Currently no decision has been made to change the plan of care. Pt on contact precautions for vre.

## 2019-06-01 NOTE — Progress Notes (Signed)
[facility] [place] NAME: Crystal Vargas    MR#:  [id number]  DATE OF BIRTH:  04-04-1945  SUBJECTIVE:   Patient will not got transferred to the ICU with declining overall condition. Worsening mentation and respiratory failure. Currently on BiPAP.  Patient's daughter Lattie Haw and [name] in the room. Not responsive to verbal commands. REVIEW OF SYSTEMS:   Review of Systems  Unable to perform ROS: Medical condition   Tolerating Diet: pt tolerates po meds Tolerating PT: bedbound  DRUG ALLERGIES:   Allergies  Allergen Reactions  . Abacavir Other (See Comments)  . Cephalosporins Other (See Comments)    Tolerated ceftaroline 06/22/2019  . Ciprofloxacin Other (See Comments)  . Nsaids Other (See Comments)  . Penicillin G Other (See Comments)  . Quinolones Other (See Comments)  . Salicylates Other (See Comments)    VITALS:  Blood pressure 114/78, pulse (!) 52, temperature 98.1 F (36.7 C), temperature source Axillary, resp. rate 19, height 5' 2"  (1.575 m), weight 115.7 kg, SpO2 100 %.  PHYSICAL EXAMINATION:   Physical Exam limited exam secondary patient participation  GENERAL:  74 y.o.-year-old patient lying in the bed with acute distress.  Chronically ill, deconditioned, critically ill on BiPAP++ LUNGS: shallow breath sounds bilaterally, no wheezing, rales, rhonchi. ON BIPAP CARDIOVASCULAR: S1, S2 normal. No murmurs, rubs, or gallops.  ABDOMEN: Soft, nontender, nondistended.few Bowel sounds present. No organomegaly or mass. PEG+ abdominal binder present EXTREMITIES: No cyanosis, clubbing or edema b/l.   Chronic venous stasis changes + NEUROLOGIC: chronic left hemiparesis, muscle atrophy PSYCHIATRIC: severe lethargy SKIN:as below Pressure Injury 06/04/2019 Heel Left;Medial Unstageable - Full thickness tissue loss in which the base of the injury is covered by slough (yellow, tan, gray, green or brown) and/or eschar (tan, brown or black) in the  wound bed. unstagable with eshcar on le (Active)  05/16/2019 1830  Location: Heel  Location Orientation: Left;Medial  Staging: Unstageable - Full thickness tissue loss in which the base of the injury is covered by slough (yellow, tan, gray, green or brown) and/or eschar (tan, brown or black) in the wound bed.  Wound Description (Comments): unstagable with eshcar on left heel  Present on Admission: Yes     Pressure Injury 05/14/2019 Toe (Comment  which one) Left;Medial Deep Tissue Pressure Injury - Purple or maroon localized area of discolored intact skin or blood-filled blister due to damage of underlying soft tissue from pressure and/or shear. deep tissure (Active)  05/18/2019 1830  Location: Toe (Comment  which one) (left big toe)  Location Orientation: Left;Medial  Staging: Deep Tissue Pressure Injury - Purple or maroon localized area of discolored intact skin or blood-filled blister due to damage of underlying soft tissue from pressure and/or shear.  Wound Description (Comments): deep tissure injury on left big toe  Present on Admission: Yes     Pressure Injury 05/17/2019 Sacrum Medial Stage 2 -  Partial thickness loss of dermis presenting as a shallow open injury with a red, pink wound bed without slough. Pink (Active)  05/16/2019 1000  Location: Sacrum  Location Orientation: Medial  Staging: Stage 2 -  Partial thickness loss of dermis presenting as a shallow open injury with a red, pink wound bed without slough.  Wound Description (Comments): Pink  Present on Admission:       LABORATORY PANEL:  CBC Recent Labs  Lab 06/01/19 0742  WBC 13.1*  HGB 8.5*  HCT 28.1*  PLT 114*    Chemistries  Recent Labs  Lab 06/01/19 0344  NA 141  K 3.2*  CL 108  CO2 25  GLUCOSE 200*  BUN 20  CREATININE 2.18*  CALCIUM 8.5*  MG 1.8  AST 24  ALT 19  ALKPHOS 74  BILITOT 0.6   Cardiac Enzymes No results for input(s): TROPONINI in the last 168 hours. RADIOLOGY:  DG Chest 1 View  Result  Date: 05/31/2019 CLINICAL DATA:  74 year old female with shortness of breath. Possible aspiration. EXAM: CHEST  1 VIEW COMPARISON:  Chest radiograph dated 05/17/2019. FINDINGS: Left IJ dialysis catheter in similar position. Interval placement of a right IJ central venous line with tip over central SVC. There is shallow inspiration with bilateral pleural effusions and bibasilar atelectasis. Diffuse vascular congestion and edema. Pneumonia is not excluded. Clinical correlation is recommended. No pneumothorax. There is atherosclerotic calcification of the aorta. No acute osseous pathology. IMPRESSION: 1. Bilateral pleural effusions with bibasilar atelectasis. 2. Diffuse vascular congestion and edema. Pneumonia is not excluded. Electronically Signed   By: Anner Crete M.D.   On: 05/31/2019 21:06   CT HEAD WO CONTRAST  Result Date: 05/31/2019 CLINICAL DATA:  Altered mental status. EXAM: CT HEAD WITHOUT CONTRAST TECHNIQUE: Contiguous axial images were obtained from the base of the skull through the vertex without intravenous contrast. COMPARISON:  May 10, 2019 FINDINGS: Brain: There is mild cerebral atrophy with widening of the extra-axial spaces and ventricular dilatation. There are areas of decreased attenuation within the white matter tracts of the supratentorial brain, consistent with microvascular disease changes. A large area of cortical encephalomalacia, with adjacent chronic white matter low attenuation, is seen within the right parietal lobe. Associated ex vacuo dilatation of the right lateral ventricle is seen. These findings are present on the prior exam. Vascular: No hyperdense vessel or unexpected calcification. Skull: Normal. Negative for fracture or focal lesion. Sinuses/Orbits: There is mild sphenoid sinus mucosal thickening. Other: None. IMPRESSION: 1. Generalized cerebral atrophy. 2. Large, chronic right parietal lobe infarct. 3. No acute intracranial abnormality. Electronically Signed   By:  Virgina Norfolk M.D.   On: 05/31/2019 21:34   MR BRAIN WO CONTRAST  Result Date: 06/01/2019 CLINICAL DATA:  Initial evaluation for acute encephalopathy. EXAM: MRI HEAD WITHOUT CONTRAST TECHNIQUE: Multiplanar, multiecho pulse sequences of the brain and surrounding structures were obtained without intravenous contrast. COMPARISON:  Prior head CT from 05/31/2019. FINDINGS: Brain: Examination technically limited as the patient's vital signs peaking unstable during the course of the exam. Examination was terminated prematurely. Axial DWI sequence only was performed. Diffusion-weighted imaging demonstrates no evidence for acute or subacute ischemia. Large area of encephalomalacia of involving the right cerebral hemisphere compatible with a chronic right MCA territory infarct. No other visible areas of chronic infarction. No appreciable mass lesion, mass effect, or midline shift on this limited exam. Ex vacuo dilatation of the right lateral ventricle related to the chronic right cerebral encephalomalacia. No hydrocephalus. No extra-axial fluid collection. No obvious acute intracranial abnormality on this limited exam. Vascular: Not assessed on this limited exam. Skull and upper cervical spine: Not well assessed on this limited exam. Sinuses/Orbits: Not well assessed on this limited exam. Other: None. IMPRESSION: 1. Technically limited exam with only axial DWI sequence performed. Patient's vital signs became unstable during the course of the study, which as a result was terminated early. 2. No evidence for acute or subacute infarct. No other definite acute intracranial abnormality on this limited study. 3. Chronic right MCA territory infarct. Electronically Signed   By: Marland Kitchen  Jeannine Boga M.D.   On: 06/01/2019 02:13   ASSESSMENT AND PLAN:  Crystal Vargas is a 74 y.o.femalewho is bed bound and from SNF with history ofend-stage renal disease recently started on hemodialysis, history of right MCA stroke, history of  dysphagia due to Schatzki's ring and is status post esophageal dilatation who was admitted with altered mental status.Her daughter noted poor PO intake thought to be due to dysphagia.   Acute metabolic encephalopathy with acute on chronic hypoxic/hyper cardiac respiratory failure with bilateral pleural effusion? Questionable pneumonia/? Aspiration /sepsis/pulmonary edema with volume overload -patient transfer to ICU -on BiPAP -has hypothermia-- placed on bear hugger -appreciate ICU attending input-- patient overall has a poor prognosis -elevated lactate 3.8--4.9 -not on pressers currently - Last hemodialysis was done on 4/23-- 1400 output -currently is hemodynamically unstable for any dialysis per nephrology  Hypoglycemia -has history of diabetes. Her sugars were high in the 400s yesterday evening. She was started on tube feeding received insulin Lantus. Acute feeling were held. Sugar drop down to 28 -start on dextrose 10 drip monitor sugars Q hourly  MRSA bacteremia/ septic shock -due to infected dialysis cath - appreciate ID assistance- currently on Daptomycin and Teflaro- awating approval for dapto -CK 16--166--164 - repeat cultures 4/9 negative - TEE negative for vegetation -s/p  IV Hickman catheter placement by Dr schnier on 06/02/2019 for Teflaro for minimal 6 wks (last dose 06/20/2019)  Dysphagia- chronic issue - EGD performed 4/14 revealed and esophageal stenosis that was dilated -on tube feeding (currently on hold)  Poor oral intake  eating < 50 % of meals  - 4/17> continue to barely eat meals - 4/17>- added Megace- increased Nepro- started calorie count - 4/20> she is wanting a feeding tube-- s/p PEG tube  placed By Dr Allen Norris on 4/22 - TF started tolerating well-- now placed on hold given deterioration in patient's condition  ESRD on HD - infected cath removed and tunnelled HD cath placed on 4/12 -cont dialysis per renal team - MWF dialysis with [facility] -last HD  4/23  HTN - meds being titrated by Renal  Acute anemia (possible related to infection/ acute illness) in addition to CKD - has been transfused 3 U PRBC so far - 4/3 (1 U) and 4/12 ( 2 U) -globin 8.5   Diabetes Mellitus 2 with ESRD  - A1c checked on 4/3 was 5.6 and she was not on medications as outpt -- patient was found to have hypoglycemia this morning. Currently on D10 drip   Status post CVA - bed bound with left sided weakness (0/5) and left facial droop - resume plavix - cont Statin  Seizure disorder - cont  Keppra  Mild thrombocytopenia - possible due to sepsis- follow  Depression - on Remeron and Zoloft  Pt was seen by Palliative care on 05/12/2019--Family wants everything done.   Dr Lanney Gins, Dr Theador Hawthorne and I met with patient's daughter is Lattie Haw and [name] and had a long discussion in the ICU. Patient has extremely poor prognosis. Questions answered collectively. Sisters are waiting for brother to come by. Likely leaning towards comfort care. Will await final decision.  DVT prophylaxis: SCDs Code Status: Full code Family Communication:  discussed with Lattie Haw and [name] daughters Disposition Plan:  to be determined. Consultants: Nephro,Cardiology,GI,ID,Palliative care Procedures:  4/14- EGDBenign-appearing esophageal stenosis 4/12- left IJ 2 D ECHO 4/22 PEG tube  Status is: Inpatient Dispo: The patient is from:rehab              Anticipated  d/c is to her SNF              Anticipated d/c date is:TBD even deterioration of patient's condition and being transferred back to the ICU              Patient currently unstable for discharge given multiple co-morbidities currently in the ICU. long term prognosis poor  TOTAL TIME TAKING CARE OF THIS PATIENT: 35* minutes.  >50% time spent on counselling and coordination of care  Note: This dictation was prepared with Dragon dictation along with smaller phrase technology. Any transcriptional errors that result from this  process are unintentional.  Fritzi Mandes M.D    [facility]   CC: Primary care physician; Marisa Hua, MDPatient ID: Crystal Vargas, female   DOB: 1945/03/18, 74 y.o.   MRN: [id number]

## 2019-06-01 NOTE — Progress Notes (Signed)
Pt is now more awake, oriented to self, place, and time.  She is able to follow commands with RUE & RLL (baseline left hemiparesis).  Had previously ordered ABG for follow up with BiPAP, given that pt is now more awake, will d/c ABG.  Continue to monitor.    Darel Hong, AGACNP-BC [facility] Pager: [phone]

## 2019-06-01 NOTE — Plan of Care (Signed)

## 2019-06-01 NOTE — Progress Notes (Signed)
Crystal Vargas  MRN: [id number]  DOB/AGE: 1945/12/21 74 y.o.  Primary Care Physician:Henry-Smith, Arbie Cookey, MD  Admit date: 05/16/2019  Chief Complaint:  Chief Complaint  Patient presents with  . Code Sepsis    S-Pt presented on  05/14/2019 with  Chief Complaint  Patient presents with  . Code Sepsis  . Patient was transferred to ICU.  Patient was seen in ICU today Patient is on warming blanket Patient is on BiPAP Patient is unable to offer any complaints. I contacted patient's next of the kin-patient's 2 daughters came in.  I had extensive discussion the patient' family regarding poor prognosis. Patient family is leaning towards palliative care versus hospice.  We will continue to follow  meds . allopurinol  100 mg Oral QPM  . vitamin C  500 mg Per Tube BID  . B-complex with vitamin C  1 tablet Per Tube Daily  . brimonidine  1 drop Both Eyes Q12H   And  . timolol  1 drop Both Eyes Q12H  . Chlorhexidine Gluconate Cloth  6 each Topical Daily  . clopidogrel  75 mg Oral QPM  . epoetin (EPOGEN/PROCRIT) injection  4,000 Units Intravenous Q M,W,F-HD  . feeding supplement (PRO-STAT SUGAR FREE 64)  30 mL Per Tube Daily  . free water  30 mL Per Tube Q4H  . insulin aspart  0-5 Units Subcutaneous QHS  . insulin aspart  0-9 Units Subcutaneous TID WC  . latanoprost  1 drop Both Eyes QPM  . levETIRAcetam  1,000 mg Oral QPM  . megestrol  400 mg Oral BID  . midodrine  5 mg Oral Q M,W,F  . pantoprazole  40 mg Oral QHS  . sertraline  50 mg Oral Daily         JTT:SVXBLT to get any date   Physical Exam: Vital signs in last 24 hours: Temp:  [97.7 F (36.5 C)-98.1 F (36.7 C)] 98.1 F (36.7 C) (04/25 0400) Pulse Rate:  [45-68] 45 (04/25 0700) Resp:  [11-21] 16 (04/25 0700) BP: (97-153)/(48-63) 123/49 (04/25 0700) SpO2:  [85 %-100 %] 100 % (04/25 0700) Weight change:  Last BM Date: 05/31/19  Intake/Output from previous day: 04/24 0701 - 04/25 0700 In: 546.1 [IV  Piggyback:546.1] Out: -  No intake/output data recorded.   Physical Exam: General- pt is chronically ill appearing. HEENT- atraumatic, normcephalic, BiPAP in situ Resp- mild acute REsp distress,  Rhonchi+ CVS- S1S2 regular in rate and rhythm GIT- BS+, soft, NT, ND, PEG tube in situ  EXT- NO LE Edema, no Cyanosis Access-Left tunneled cath in situ   Lab Results: CBC Recent Labs    06/01/19 0344 06/01/19 0742  WBC 11.9* 13.1*  HGB 7.6* 8.5*  HCT 25.5* 28.1*  PLT 115* 114*     Hemoglobin was 10.1 on April 14   BMET Recent Labs    05/31/19 2025 06/01/19 0344  NA 137 141  K 3.7 3.2*  CL 104 108  CO2 27 25  GLUCOSE 460* 200*  BUN 18 20  CREATININE 2.18* 2.18*  CALCIUM 8.1* 8.5*    MICRO Recent Results (from the past 240 hour(s))  Respiratory Panel by RT PCR (Flu A&B, Covid) - Nasopharyngeal Swab     Status: None   Collection Time: 05/23/2019 12:38 PM   Specimen: Nasopharyngeal Swab  Result Value Ref Range Status   SARS Coronavirus 2 by RT PCR NEGATIVE NEGATIVE Final    Comment: (NOTE) SARS-CoV-2 target nucleic acids are NOT DETECTED. The SARS-CoV-2 RNA is generally detectable  in upper respiratoy specimens during the acute phase of infection. The lowest concentration of SARS-CoV-2 viral copies this assay can detect is 131 copies/mL. A negative result does not preclude SARS-Cov-2 infection and should not be used as the sole basis for treatment or other patient management decisions. A negative result may occur with  improper specimen collection/handling, submission of specimen other than nasopharyngeal swab, presence of viral mutation(s) within the areas targeted by this assay, and inadequate number of viral copies (<131 copies/mL). A negative result must be combined with clinical observations, patient history, and epidemiological information. The expected result is Negative. Fact Sheet for Patients:  PinkCheek.be Fact Sheet for  Healthcare Providers:  GravelBags.it This test is not yet ap proved or cleared by the [place] FDA and  has been authorized for detection and/or diagnosis of SARS-CoV-2 by FDA under an Emergency Use Authorization (EUA). This EUA will remain  in effect (meaning this test can be used) for the duration of the COVID-19 declaration under Section 564(b)(1) of the Act, 21 U.S.C. section 360bbb-3(b)(1), unless the authorization is terminated or revoked sooner.    Influenza A by PCR NEGATIVE NEGATIVE Final   Influenza B by PCR NEGATIVE NEGATIVE Final    Comment: (NOTE) The Xpert Xpress SARS-CoV-2/FLU/RSV assay is intended as an aid in  the diagnosis of influenza from Nasopharyngeal swab specimens and  should not be used as a sole basis for treatment. Nasal washings and  aspirates are unacceptable for Xpert Xpress SARS-CoV-2/FLU/RSV  testing. Fact Sheet for Patients: PinkCheek.be Fact Sheet for Healthcare Providers: GravelBags.it This test is not yet approved or cleared by the [place] FDA and  has been authorized for detection and/or diagnosis of SARS-CoV-2 by  FDA under an Emergency Use Authorization (EUA). This EUA will remain  in effect (meaning this test can be used) for the duration of the  Covid-19 declaration under Section 564(b)(1) of the Act, 21  U.S.C. section 360bbb-3(b)(1), unless the authorization is  terminated or revoked. Performed at [facility], [address] [place], [place] [address]       Lab Results  Component Value Date   PTH 232 (H) 05/11/2019   CALCIUM 8.5 (L) 06/01/2019   PHOS 3.9 06/01/2019               Impression:   Patient is a 74 year old African-American female with a past medical history of end-stage on hemodialysis, CVA with left-sided hemiparesis, hypertension, hyperlipidemia, GERD, diabetes mellitus type 2, CAD, gout, seizure  disorder who was admitted on May 10, 2019 with hypokalemia, shock, ESRD, hypertension, anemia   1)Renal ESRD Patient is on hemodialysis Patient is on Monday Wednesday Friday schedule. Patient was last dialyzed yesterday No need for hemodialysis today   2) hypotension with MRSA bacteremia/sepsis Patient had blood cultures positive earlier Patient blood culture have been negative since April 9 Patient permacath was removed and new catheter was placed on May 19, 2019  3)Anemia of chronic disease  HGb at goal (9--11) Patient is on Epogen protocol  4) secondary hyperparathyroidism -CKD Mineral-Bone Disorder   Secondary Hyperparathyroidism  Present. Phosphorus at goal.   5) malnutrition Patient is now s/p PEG tube placement on May 29, 2019  6) electrolytes   sodium Normonatremic   potassium Hypokalemia Being replete    7)Acid base Co2 at goal  8) pneumonia Patient has possibly developed new pneumonia as per the chest x-ray report Patient is on broad-spectrum antibiotics Pulmonary and ID are following  9) Acute respiratory failure  Patient is on BiPAP.    Plan:   I had extensive discussion with the patient family about poor prognosis. No need for dialysis today    Jaynell Castagnola s Lenzi Marmo 06/01/2019, 8:16 AM

## 2019-06-01 NOTE — Progress Notes (Signed)
CRITICAL CARE PROGRESS NOTE    Name: Crystal Vargas MRN: [id number] DOB: 08/11/1945     LOS: 19   SUBJECTIVE FINDINGS & SIGNIFICANT EVENTS   Patient description:  74 y.o. Female initially admitted 05/21/2019 with MRSA bacteremia & septic shock due to infected Hemodialysis catheter.  Catheter removed, and ID following.  Receiving Daptomycin & Ceftaroline.  On 4/24 developed Acute Metabolic Encephalopathy & Acute Hypoxic Hypercapnic Respiratory Failure secondary to Acute Decompensated HFrEF and possible pneumonia requiring BiPAP.  Lines / Drains: Right and left chest port  Cultures / Sepsis markers: Staph aureus + blood culture  Antibiotics: Teflaro and dapto per ID   Protocols / Consultants: PCCM, hospitalist, pharmacy, nephrology    Overnight: 06/01/19 - Patient is critically ill with overall poor prognosis due to advanced age with multiple severe chronic medical conditions with prolonged hospitalization and sepsis bacteremia.    PAST MEDICAL HISTORY   Past Medical History:  Diagnosis Date  . Chronic kidney disease    RENAL INSUFFICIENCY  . Coronary artery disease   . Depression   . Diabetes mellitus without complication ([place])   . GERD (gastroesophageal reflux disease)   . Glaucoma   . Hyperlipidemia   . Hypertension   . Stroke [facility])      SURGICAL HISTORY   Past Surgical History:  Procedure Laterality Date  . ABDOMINAL HYSTERECTOMY    . CENTRAL LINE INSERTION-TUNNELED Right 06/02/2019   Procedure: CENTRAL LINE INSERTION-TUNNELED;  Surgeon: Delana Meyer Dolores Lory, MD;  Location: [place] [facility] LAB;  Service: Cardiovascular;  Laterality: Right;  . DIALYSIS/PERMA CATHETER INSERTION N/A 05/11/2019   Procedure: DIALYSIS/PERMA CATHETER INSERTION;  Surgeon: Algernon Huxley, MD;  Location: [place] [facility]  [facility];  Service: Cardiovascular;  Laterality: N/A;  . ESOPHAGOGASTRODUODENOSCOPY N/A 06/05/2019   Procedure: ESOPHAGOGASTRODUODENOSCOPY (EGD);  Surgeon: Lin Landsman, MD;  Location: [facility] ENDOSCOPY;  Service: Gastroenterology;  Laterality: N/A;  . ESOPHAGOGASTRODUODENOSCOPY (EGD) WITH PROPOFOL N/A 01/21/2018   Procedure: ESOPHAGOGASTRODUODENOSCOPY (EGD) WITH PROPOFOL;  Surgeon: Manya Silvas, MD;  Location: [facility] ENDOSCOPY;  Service: Endoscopy;  Laterality: N/A;  . PEG PLACEMENT N/A 05/27/2019   Procedure: PERCUTANEOUS ENDOSCOPIC GASTROSTOMY (PEG) PLACEMENT;  Surgeon: Lucilla Lame, MD;  Location: [facility] ENDOSCOPY;  Service: Endoscopy;  Laterality: N/A;  . TEE WITHOUT CARDIOVERSION N/A 06/02/2019   Procedure: TRANSESOPHAGEAL ECHOCARDIOGRAM (TEE);  Surgeon: Teodoro Spray, MD;  Location: [facility] ORS;  Service: Cardiovascular;  Laterality: N/A;  . TEE WITHOUT CARDIOVERSION N/A 05/16/2019   Procedure: TRANSESOPHAGEAL ECHOCARDIOGRAM (TEE);  Surgeon: Teodoro Spray, MD;  Location: [facility] ORS;  Service: Cardiovascular;  Laterality: N/A;  . TEMPORARY DIALYSIS CATHETER N/A 05/31/2019   Procedure: TEMPORARY DIALYSIS CATHETER;  Surgeon: Algernon Huxley, MD;  Location: [place] [facility];  Service: Cardiovascular;  Laterality: N/A;     FAMILY HISTORY   History reviewed. No pertinent family history.   SOCIAL HISTORY   Social History   Tobacco Use  . Smoking status: Never Smoker  . Smokeless tobacco: Never Used  Substance Use Topics  . Alcohol use: Not Currently  . Drug use: Never     MEDICATIONS   Current Medication:  Current Facility-Administered Medications:  .  0.9 %  sodium chloride infusion, , Intravenous, PRN, Schnier, Dolores Lory, MD, Last Rate: 5 mL/hr at 06/01/19 0903, 500 mL at 06/01/19 0903 .  acetaminophen (TYLENOL) 160 MG/5ML solution 650 mg, 650 mg, Per Tube, Q6H PRN, Darel Hong D, NP, 650 mg at 06/01/19 0404 .  allopurinol (ZYLOPRIM) tablet 100 mg, 100  mg, Oral, QPM, Schnier,  Dolores Lory, MD, 100 mg at 05/31/19 1743 .  ascorbic acid (VITAMIN C) tablet 500 mg, 500 mg, Per Tube, BID, Fritzi Mandes, MD, 500 mg at 06/01/19 0814 .  B-complex with vitamin C tablet 1 tablet, 1 tablet, Per Tube, Daily, Fritzi Mandes, MD, 1 tablet at 05/31/19 2359 .  brimonidine (ALPHAGAN) 0.2 % ophthalmic solution 1 drop, 1 drop, Both Eyes, Q12H, 1 drop at 06/01/19 0911 **AND** timolol (TIMOPTIC) 0.5 % ophthalmic solution 1 drop, 1 drop, Both Eyes, Q12H, Schnier, Dolores Lory, MD, 1 drop at 06/01/19 0912 .  ceftaroline (TEFLARO) 200 mg in sodium chloride 0.9 % 250 mL IVPB, 200 mg, Intravenous, Q8H, Schnier, Dolores Lory, MD, Last Rate: 250 mL/hr at 06/01/19 0910, 200 mg at 06/01/19 0910 .  Chlorhexidine Gluconate Cloth 2 % PADS 6 each, 6 each, Topical, Daily, Schnier, Dolores Lory, MD, 6 each at 06/01/19 0404 .  clopidogrel (PLAVIX) tablet 75 mg, 75 mg, Oral, QPM, Fritzi Mandes, MD, 75 mg at 05/31/19 1743 .  DAPTOmycin (CUBICIN) 500 mg in sodium chloride 0.9 % IVPB, 500 mg, Intravenous, Q48H, Schnier, Dolores Lory, MD, Stopped at 05/30/19 2103 .  epoetin alfa (EPOGEN) injection 4,000 Units, 4,000 Units, Intravenous, Q M,W,F-HD, Schnier, Dolores Lory, MD, 4,000 Units at 05/30/19 1228 .  feeding supplement (OSMOLITE 1.5 CAL) liquid 1,000 mL, 1,000 mL, Per Tube, Continuous, Fritzi Mandes, MD, Stopped at 05/31/19 2030 .  feeding supplement (PRO-STAT SUGAR FREE 64) liquid 30 mL, 30 mL, Per Tube, Daily, Fritzi Mandes, MD, 30 mL at 06/01/19 0911 .  free water 30 mL, 30 mL, Per Tube, Q4H, Fritzi Mandes, MD, 30 mL at 06/01/19 0912 .  insulin aspart (novoLOG) injection 0-5 Units, 0-5 Units, Subcutaneous, QHS, Patel, Sona, MD .  insulin aspart (novoLOG) injection 0-9 Units, 0-9 Units, Subcutaneous, TID WC, Patel, Sona, MD .  latanoprost (XALATAN) 0.005 % ophthalmic solution 1 drop, 1 drop, Both Eyes, QPM, Schnier, Dolores Lory, MD, 1 drop at 05/31/19 1755 .  levETIRAcetam (KEPPRA) tablet 1,000 mg, 1,000 mg, Oral, QPM, Schnier, Dolores Lory,  MD, 1,000 mg at 05/31/19 1743 .  megestrol (MEGACE) 400 MG/10ML suspension 400 mg, 400 mg, Oral, BID, Schnier, Dolores Lory, MD, 400 mg at 06/01/19 0911 .  midodrine (PROAMATINE) tablet 5 mg, 5 mg, Oral, Q M,W,F, Schnier, Dolores Lory, MD, 5 mg at 05/12/2019 1405 .  ondansetron (ZOFRAN) injection 4 mg, 4 mg, Intravenous, Q6H PRN, Schnier, Dolores Lory, MD .  pantoprazole (PROTONIX) EC tablet 40 mg, 40 mg, Oral, QHS, Schnier, Dolores Lory, MD, 40 mg at 05/30/19 2157 .  sertraline (ZOLOFT) tablet 50 mg, 50 mg, Oral, Daily, Schnier, Dolores Lory, MD, 50 mg at 06/01/19 0911    ALLERGIES   Abacavir, Cephalosporins, Ciprofloxacin, Nsaids, Penicillin g, Quinolones, and Salicylates    REVIEW OF SYSTEMS    ROS unable to obtain due to poorly responsive state  PHYSICAL EXAMINATION   Vital Signs: Temp:  [97.7 F (36.5 C)-98.1 F (36.7 C)] 98.1 F (36.7 C) (04/25 0400) Pulse Rate:  [45-68] 52 (04/25 0840) Resp:  [11-22] 22 (04/25 0840) BP: (97-153)/(48-63) 128/55 (04/25 0800) SpO2:  [85 %-100 %] 100 % (04/25 0800)  GENERAL:chronically ill apprearing HEAD: Normocephalic, atraumatic.  EYES: Pupils equal, round, reactive to light.  No scleral icterus.  MOUTH: Moist mucosal membrane. NECK: Supple. No thyromegaly. No nodules. No JVD.  PULMONARY: mildly rhonchorous breath souds bilaterally  CARDIOVASCULAR: S1 and S2. Regular rate and rhythm. No murmurs, rubs, or gallops.  GASTROINTESTINAL:  Soft, nontender, non-distended. No masses. Positive bowel sounds. No hepatosplenomegaly.  MUSCULOSKELETAL: No swelling, clubbing, or edema.  NEUROLOGIC: Mild distress due to acute illness SKIN:intact,warm,dry   PERTINENT DATA     Infusions: . sodium chloride 500 mL (06/01/19 0903)  . ceFTAROline (TEFLARO) IV 200 mg (06/01/19 0910)  . DAPTOmycin (CUBICIN)  IV Stopped (05/30/19 2103)  . feeding supplement (OSMOLITE 1.5 CAL) Stopped (05/31/19 2030)   Scheduled Medications: . allopurinol  100 mg Oral QPM  .  vitamin C  500 mg Per Tube BID  . B-complex with vitamin C  1 tablet Per Tube Daily  . brimonidine  1 drop Both Eyes Q12H   And  . timolol  1 drop Both Eyes Q12H  . Chlorhexidine Gluconate Cloth  6 each Topical Daily  . clopidogrel  75 mg Oral QPM  . epoetin (EPOGEN/PROCRIT) injection  4,000 Units Intravenous Q M,W,F-HD  . feeding supplement (PRO-STAT SUGAR FREE 64)  30 mL Per Tube Daily  . free water  30 mL Per Tube Q4H  . insulin aspart  0-5 Units Subcutaneous QHS  . insulin aspart  0-9 Units Subcutaneous TID WC  . latanoprost  1 drop Both Eyes QPM  . levETIRAcetam  1,000 mg Oral QPM  . megestrol  400 mg Oral BID  . midodrine  5 mg Oral Q M,W,F  . pantoprazole  40 mg Oral QHS  . sertraline  50 mg Oral Daily   PRN Medications: sodium chloride, acetaminophen (TYLENOL) oral liquid 160 mg/5 mL, ondansetron (ZOFRAN) IV Hemodynamic parameters:   Intake/Output: 04/24 0701 - 04/25 0700 In: 546.1 [IV Piggyback:546.1] Out: -   Ventilator  Settings:     LAB RESULTS:  Basic Metabolic Panel: Recent Labs  Lab 05/23/2019 1558 06/01/2019 0654 05/25/2019 0654 05/30/19 1236 05/31/19 0454 05/31/19 0454 05/31/19 2025 06/01/19 0344  NA  --  141  --   --   --   --  137 141  K  --  3.5   < >  --  3.2*   < > 3.7 3.2*  CL  --  109  --   --   --   --  104 108  CO2  --  27  --   --   --   --  27 25  GLUCOSE  --  130*  --   --   --   --  460* 200*  BUN  --  17  --   --   --   --  18 20  CREATININE  --  2.54*  --   --   --   --  2.18* 2.18*  CALCIUM  --  8.2*  --   --   --   --  8.1* 8.5*  MG  --   --   --  1.5* 2.0  --  1.9 1.8  PHOS 2.2*  --   --  1.8* 5.0*  --  4.8* 3.9   < > = values in this interval not displayed.   Liver Function Tests: Recent Labs  Lab 05/31/19 2025 06/01/19 0344  AST 23 24  ALT 22 19  ALKPHOS 72 74  BILITOT 0.6 0.6  PROT 5.9* 5.6*  ALBUMIN 1.8* 1.6*   Recent Labs  Lab 05/31/19 2025 05/31/19 2140 06/01/19 0344  LIPASE 117*  --  105*  AMYLASE  --  133*  136*   Recent Labs  Lab 06/01/19 0056  AMMONIA 42*   CBC: Recent Labs  Lab 05/31/19 2025 06/01/19 0344 06/01/19 0742  WBC 13.4* 11.9* 13.1*  NEUTROABS  --  10.1*  --   HGB 8.1* 7.6* 8.5*  HCT 26.2* 25.5* 28.1*  MCV 97.0 98.8 99.3  PLT 135* 115* 114*   Cardiac Enzymes: Recent Labs  Lab 05/23/2019 0654 05/30/19 1236  CKTOTAL 166 124   BNP: Invalid input(s): POCBNP CBG: Recent Labs  Lab 05/31/19 2037 05/31/19 2142 05/31/19 2334 06/01/19 0400 06/01/19 0733  GLUCAP 425* 403* 358* 191* 80     IMAGING RESULTS:  Imaging: DG Chest 1 View  Result Date: 05/31/2019 CLINICAL DATA:  74 year old female with shortness of breath. Possible aspiration. EXAM: CHEST  1 VIEW COMPARISON:  Chest radiograph dated 06/02/2019. FINDINGS: Left IJ dialysis catheter in similar position. Interval placement of a right IJ central venous line with tip over central SVC. There is shallow inspiration with bilateral pleural effusions and bibasilar atelectasis. Diffuse vascular congestion and edema. Pneumonia is not excluded. Clinical correlation is recommended. No pneumothorax. There is atherosclerotic calcification of the aorta. No acute osseous pathology. IMPRESSION: 1. Bilateral pleural effusions with bibasilar atelectasis. 2. Diffuse vascular congestion and edema. Pneumonia is not excluded. Electronically Signed   By: Anner Crete M.D.   On: 05/31/2019 21:06   CT HEAD WO CONTRAST  Result Date: 05/31/2019 CLINICAL DATA:  Altered mental status. EXAM: CT HEAD WITHOUT CONTRAST TECHNIQUE: Contiguous axial images were obtained from the base of the skull through the vertex without intravenous contrast. COMPARISON:  May 10, 2019 FINDINGS: Brain: There is mild cerebral atrophy with widening of the extra-axial spaces and ventricular dilatation. There are areas of decreased attenuation within the white matter tracts of the supratentorial brain, consistent with microvascular disease changes. A large area of  cortical encephalomalacia, with adjacent chronic white matter low attenuation, is seen within the right parietal lobe. Associated ex vacuo dilatation of the right lateral ventricle is seen. These findings are present on the prior exam. Vascular: No hyperdense vessel or unexpected calcification. Skull: Normal. Negative for fracture or focal lesion. Sinuses/Orbits: There is mild sphenoid sinus mucosal thickening. Other: None. IMPRESSION: 1. Generalized cerebral atrophy. 2. Large, chronic right parietal lobe infarct. 3. No acute intracranial abnormality. Electronically Signed   By: Virgina Norfolk M.D.   On: 05/31/2019 21:34   MR BRAIN WO CONTRAST  Result Date: 06/01/2019 CLINICAL DATA:  Initial evaluation for acute encephalopathy. EXAM: MRI HEAD WITHOUT CONTRAST TECHNIQUE: Multiplanar, multiecho pulse sequences of the brain and surrounding structures were obtained without intravenous contrast. COMPARISON:  Prior head CT from 05/31/2019. FINDINGS: Brain: Examination technically limited as the patient's vital signs peaking unstable during the course of the exam. Examination was terminated prematurely. Axial DWI sequence only was performed. Diffusion-weighted imaging demonstrates no evidence for acute or subacute ischemia. Large area of encephalomalacia of involving the right cerebral hemisphere compatible with a chronic right MCA territory infarct. No other visible areas of chronic infarction. No appreciable mass lesion, mass effect, or midline shift on this limited exam. Ex vacuo dilatation of the right lateral ventricle related to the chronic right cerebral encephalomalacia. No hydrocephalus. No extra-axial fluid collection. No obvious acute intracranial abnormality on this limited exam. Vascular: Not assessed on this limited exam. Skull and upper cervical spine: Not well assessed on this limited exam. Sinuses/Orbits: Not well assessed on this limited exam. Other: None. IMPRESSION: 1. Technically limited exam  with only axial DWI sequence performed. Patient's vital signs became unstable during the course of the study, which as a result was terminated  early. 2. No evidence for acute or subacute infarct. No other definite acute intracranial abnormality on this limited study. 3. Chronic right MCA territory infarct. Electronically Signed   By: Jeannine Boga M.D.   On: 06/01/2019 02:13      ASSESSMENT AND PLAN    -Multidisciplinary rounds held today  Acute Hypoxic Respiratory Failure -in context of sepsis bactermia with staph aureus - present on admission to MICU - intercurrent pleural effusions bilaterally, atelectasis without exclusion of consolidated pneumonia -continue on antimicrobials as per ID - Teflaro  and Daptomycin (not using for pneumonia) - on BIPAP - poor prognosis -family meeting regarding goals of care scheduled for today   CARDIAC FAILURE -oxygen as needed -Lasix as tolerated -follow up cardiac enzymes as indicated ICU monitoring  Renal Failure-ESRD -nephrology on case - appreciate input -follow chem 7 -follow UO -continue Foley Catheter-assess need daily  Severe protein calorie malnutrition -PEG status -post CVA -bitemporal wasting with muscular atrophy -nutritional RD asessment consultation    Acute on chronic blood loss anemia - s/p 3 untis prbcs -no grossly appreciable bleeding - due to ESRD with potential hemolysis in context of sepsis bacteremia  ID -continue IV abx as prescibed -follow up cultures  GI/Nutrition GI PROPHYLAXIS as indicated DIET-->TF's as tolerated Constipation protocol as indicated  ENDO - ICU hypoglycemic\Hyperglycemia protocol -check FSBS per protocol   ELECTROLYTES -follow labs as needed -replace as needed -pharmacy consultation   DVT/GI PRX ordered -SCDs  TRANSFUSIONS AS NEEDED MONITOR FSBS ASSESS the need for LABS as needed   Critical care provider statement:    Critical care time (minutes):  35   Critical  care time was exclusive of:  Separately billable procedures and treating other patients   Critical care was necessary to treat or prevent imminent or life-threatening deterioration of the following conditions:  acute hypoxemic respiratory failure, sepsis, septic shock, staph aureus bacteremia, PEG status, severe protein cal malnutrition , cva , multiple comorbid conditions   Critical care was time spent personally by me on the following activities:  Development of treatment plan with patient or surrogate, discussions with consultants, evaluation of patient's response to treatment, examination of patient, obtaining history from patient or surrogate, ordering and performing treatments and interventions, ordering and review of laboratory studies and re-evaluation of patient's condition.  I assumed direction of critical care for this patient from another provider in my specialty: no    This document was prepared using Dragon voice recognition software and may include unintentional dictation errors.    Ottie Glazier, M.D.  Division of [facility]

## 2019-06-02 ENCOUNTER — Other Ambulatory Visit: Payer: Self-pay

## 2019-06-02 DIAGNOSIS — R1310 Dysphagia, unspecified: Secondary | ICD-10-CM | POA: Diagnosis not present

## 2019-06-02 DIAGNOSIS — Z992 Dependence on renal dialysis: Secondary | ICD-10-CM | POA: Diagnosis not present

## 2019-06-02 DIAGNOSIS — N186 End stage renal disease: Secondary | ICD-10-CM | POA: Diagnosis not present

## 2019-06-02 DIAGNOSIS — B9562 Methicillin resistant Staphylococcus aureus infection as the cause of diseases classified elsewhere: Secondary | ICD-10-CM | POA: Diagnosis not present

## 2019-06-02 DIAGNOSIS — Z931 Gastrostomy status: Secondary | ICD-10-CM

## 2019-06-02 DIAGNOSIS — D649 Anemia, unspecified: Secondary | ICD-10-CM | POA: Diagnosis not present

## 2019-06-02 DIAGNOSIS — R7881 Bacteremia: Secondary | ICD-10-CM | POA: Diagnosis not present

## 2019-06-02 DIAGNOSIS — R0603 Acute respiratory distress: Secondary | ICD-10-CM

## 2019-06-02 DIAGNOSIS — R627 Adult failure to thrive: Secondary | ICD-10-CM | POA: Diagnosis not present

## 2019-06-02 DIAGNOSIS — G9341 Metabolic encephalopathy: Secondary | ICD-10-CM | POA: Diagnosis not present

## 2019-06-02 DIAGNOSIS — Z8673 Personal history of transient ischemic attack (TIA), and cerebral infarction without residual deficits: Secondary | ICD-10-CM

## 2019-06-02 DIAGNOSIS — K222 Esophageal obstruction: Secondary | ICD-10-CM | POA: Diagnosis not present

## 2019-06-02 LAB — RENAL FUNCTION PANEL
Albumin: 1.5 g/dL — ABNORMAL LOW (ref 3.5–5.0)
Anion gap: 8 (ref 5–15)
BUN: 25 mg/dL — ABNORMAL HIGH (ref 8–23)
CO2: 23 mmol/L (ref 22–32)
Calcium: 8.1 mg/dL — ABNORMAL LOW (ref 8.9–10.3)
Chloride: 107 mmol/L (ref 98–111)
Creatinine, Ser: 2.45 mg/dL — ABNORMAL HIGH (ref 0.44–1.00)
GFR calc Af Amer: 21 mL/min — ABNORMAL LOW (ref >60)
GFR calc non Af Amer: 19 mL/min — ABNORMAL LOW (ref >60)
Glucose, Bld: 149 mg/dL — ABNORMAL HIGH (ref 70–99)
Phosphorus: 3.1 mg/dL (ref 2.5–4.6)
Potassium: 3 mmol/L — ABNORMAL LOW (ref 3.5–5.1)
Sodium: 138 mmol/L (ref 135–145)

## 2019-06-02 LAB — CBC
HCT: 23.3 % — ABNORMAL LOW (ref 36.0–46.0)
Hemoglobin: 7.8 g/dL — ABNORMAL LOW (ref 12.0–15.0)
MCH: 30.4 pg (ref 26.0–34.0)
MCHC: 33.5 g/dL (ref 30.0–36.0)
MCV: 90.7 fL (ref 80.0–100.0)
Platelets: 132 10*3/uL — ABNORMAL LOW (ref 150–400)
RBC: 2.57 MIL/uL — ABNORMAL LOW (ref 3.87–5.11)
RDW: 19 % — ABNORMAL HIGH (ref 11.5–15.5)
WBC: 13.8 10*3/uL — ABNORMAL HIGH (ref 4.0–10.5)
nRBC: 0 % (ref 0.0–0.2)

## 2019-06-02 LAB — GLUCOSE, CAPILLARY
Glucose-Capillary: 105 mg/dL — ABNORMAL HIGH (ref 70–99)
Glucose-Capillary: 107 mg/dL — ABNORMAL HIGH (ref 70–99)
Glucose-Capillary: 132 mg/dL — ABNORMAL HIGH (ref 70–99)
Glucose-Capillary: 145 mg/dL — ABNORMAL HIGH (ref 70–99)
Glucose-Capillary: 180 mg/dL — ABNORMAL HIGH (ref 70–99)
Glucose-Capillary: 197 mg/dL — ABNORMAL HIGH (ref 70–99)

## 2019-06-02 LAB — POTASSIUM
Potassium: 3 mmol/L — ABNORMAL LOW (ref 3.5–5.1)
Potassium: 3.3 mmol/L — ABNORMAL LOW (ref 3.5–5.1)

## 2019-06-02 LAB — MAGNESIUM
Magnesium: 1.6 mg/dL — ABNORMAL LOW (ref 1.7–2.4)
Magnesium: 2 mg/dL (ref 1.7–2.4)

## 2019-06-02 LAB — PHOSPHORUS: Phosphorus: 3.2 mg/dL (ref 2.5–4.6)

## 2019-06-02 LAB — PROCALCITONIN: Procalcitonin: 0.55 ng/mL

## 2019-06-02 MED ORDER — OSMOLITE 1.5 CAL PO LIQD
1000.0000 mL | ORAL | Status: DC
  Administered 2019-06-02: 1000 mL

## 2019-06-02 MED ORDER — POTASSIUM CHLORIDE 20 MEQ PO PACK
40.0000 meq | PACK | Freq: Once | ORAL | Status: AC
  Administered 2019-06-02: 40 meq
  Filled 2019-06-02: qty 2

## 2019-06-02 MED ORDER — MAGNESIUM SULFATE 2 GM/50ML IV SOLN
2.0000 g | Freq: Once | INTRAVENOUS | Status: AC
  Administered 2019-06-02: 2 g via INTRAVENOUS
  Filled 2019-06-02: qty 50

## 2019-06-02 NOTE — Progress Notes (Signed)
Pt responding more to stimulation, moaning when asked question. Still not verbal saying anything. ABG is remarkable, NP aware of pt status now. VSS. Will cont to monitor per NP. Temp improved from 2100. To 97.9/ax

## 2019-06-02 NOTE — Progress Notes (Signed)
Pt was unresponsive to calling name, stimulation, stereum rob,  Noted that pt has been on 2L/ since 1400. RT/NP was notified. Pt was placed back on Bipap to repeat ABG in one hours. VSS.

## 2019-06-02 NOTE — Progress Notes (Signed)
Date of Admission:  05/09/2019     MRSA bacteremia Dysphagia Endoscopy- dilatationschtazki ring PEG ESRD    Subjective: Back to iCU because of resp distress  Medications:  . allopurinol  100 mg Oral QPM  . vitamin C  500 mg Per Tube BID  . B-complex with vitamin C  1 tablet Per Tube Daily  . brimonidine  1 drop Both Eyes Q12H   And  . timolol  1 drop Both Eyes Q12H  . Chlorhexidine Gluconate Cloth  6 each Topical Daily  . clopidogrel  75 mg Oral QPM  . epoetin (EPOGEN/PROCRIT) injection  4,000 Units Intravenous Q M,W,F-HD  . feeding supplement (PRO-STAT SUGAR FREE 64)  30 mL Per Tube Daily  . free water  30 mL Per Tube Q4H  . insulin aspart  0-9 Units Subcutaneous Q4H  . latanoprost  1 drop Both Eyes QPM  . levETIRAcetam  1,000 mg Oral QPM  . megestrol  400 mg Oral BID  . midodrine  5 mg Oral Q M,W,F  . pantoprazole  40 mg Oral QHS  . sertraline  50 mg Oral Daily    Objective: Vital signs in last 24 hours: Temp:  [94.8 F (34.9 C)-97.9 F (36.6 C)] 97.9 F (36.6 C) (04/26 1129) Pulse Rate:  [52-64] 64 (04/26 1200) Resp:  [10-28] 19 (04/26 1200) BP: (89-129)/(35-101) 111/52 (04/26 1200) SpO2:  [99 %-100 %] 100 % (04/26 1200) FiO2 (%):  [30 %] 30 % (04/26 0800)  PHYSICAL EXAM:  General: awake Some resp distress Warming blanket Left subclavian tunneled cath and rt subclavian Lab Results Recent Labs    06/01/19 0344 06/01/19 0344 06/01/19 0742 06/02/19 0442  WBC 11.9*   < > 13.1* 13.8*  HGB 7.6*   < > 8.5* 7.8*  HCT 25.5*   < > 28.1* 23.3*  NA 141  --   --  138  K 3.2*  --   --  3.0*  3.0*  CL 108  --   --  107  CO2 25  --   --  23  BUN 20  --   --  25*  CREATININE 2.18*  --   --  2.45*   < > = values in this interval not displayed.   Liver Panel Recent Labs    05/31/19 2025 05/31/19 2025 06/01/19 0344 06/02/19 0442  PROT 5.9*  --  5.6*  --   ALBUMIN 1.8*   < > 1.6* 1.5*  AST 23  --  24  --   ALT 22  --  19  --   ALKPHOS 72  --  74  --    BILITOT 0.6  --  0.6  --    < > = values in this interval not displayed.   Sedimentation Rate No results for input(s): ESRSEDRATE in the last 72 hours. C-Reactive Protein No results for input(s): CRP in the last 72 hours.  Microbiology:  Studies/Results: DG Chest 1 View  Result Date: 05/31/2019 CLINICAL DATA:  74 year old female with shortness of breath. Possible aspiration. EXAM: CHEST  1 VIEW COMPARISON:  Chest radiograph dated 05/12/2019. FINDINGS: Left IJ dialysis catheter in similar position. Interval placement of a right IJ central venous line with tip over central SVC. There is shallow inspiration with bilateral pleural effusions and bibasilar atelectasis. Diffuse vascular congestion and edema. Pneumonia is not excluded. Clinical correlation is recommended. No pneumothorax. There is atherosclerotic calcification of the aorta. No acute osseous pathology. IMPRESSION: 1. Bilateral pleural effusions  with bibasilar atelectasis. 2. Diffuse vascular congestion and edema. Pneumonia is not excluded. Electronically Signed   By: Anner Crete M.D.   On: 05/31/2019 21:06   CT HEAD WO CONTRAST  Result Date: 05/31/2019 CLINICAL DATA:  Altered mental status. EXAM: CT HEAD WITHOUT CONTRAST TECHNIQUE: Contiguous axial images were obtained from the base of the skull through the vertex without intravenous contrast. COMPARISON:  May 10, 2019 FINDINGS: Brain: There is mild cerebral atrophy with widening of the extra-axial spaces and ventricular dilatation. There are areas of decreased attenuation within the white matter tracts of the supratentorial brain, consistent with microvascular disease changes. A large area of cortical encephalomalacia, with adjacent chronic white matter low attenuation, is seen within the right parietal lobe. Associated ex vacuo dilatation of the right lateral ventricle is seen. These findings are present on the prior exam. Vascular: No hyperdense vessel or unexpected calcification.  Skull: Normal. Negative for fracture or focal lesion. Sinuses/Orbits: There is mild sphenoid sinus mucosal thickening. Other: None. IMPRESSION: 1. Generalized cerebral atrophy. 2. Large, chronic right parietal lobe infarct. 3. No acute intracranial abnormality. Electronically Signed   By: Virgina Norfolk M.D.   On: 05/31/2019 21:34   MR BRAIN WO CONTRAST  Result Date: 06/01/2019 CLINICAL DATA:  Initial evaluation for acute encephalopathy. EXAM: MRI HEAD WITHOUT CONTRAST TECHNIQUE: Multiplanar, multiecho pulse sequences of the brain and surrounding structures were obtained without intravenous contrast. COMPARISON:  Prior head CT from 05/31/2019. FINDINGS: Brain: Examination technically limited as the patient's vital signs peaking unstable during the course of the exam. Examination was terminated prematurely. Axial DWI sequence only was performed. Diffusion-weighted imaging demonstrates no evidence for acute or subacute ischemia. Large area of encephalomalacia of involving the right cerebral hemisphere compatible with a chronic right MCA territory infarct. No other visible areas of chronic infarction. No appreciable mass lesion, mass effect, or midline shift on this limited exam. Ex vacuo dilatation of the right lateral ventricle related to the chronic right cerebral encephalomalacia. No hydrocephalus. No extra-axial fluid collection. No obvious acute intracranial abnormality on this limited exam. Vascular: Not assessed on this limited exam. Skull and upper cervical spine: Not well assessed on this limited exam. Sinuses/Orbits: Not well assessed on this limited exam. Other: None. IMPRESSION: 1. Technically limited exam with only axial DWI sequence performed. Patient's vital signs became unstable during the course of the study, which as a result was terminated early. 2. No evidence for acute or subacute infarct. No other definite acute intracranial abnormality on this limited study. 3. Chronic right MCA territory  infarct. Electronically Signed   By: Jeannine Boga M.D.   On: 06/01/2019 02:13     Assessment/Plan:  Prolonged MRSA bacteremia ( 4/3-4/7) failed vanco -on dapto+ ceftaroline for 6 weeks- will have to treat like endocarditis /deep seated infection even if TEE neg- End date 5/14 Has tunneled cath palced on 05/24/2019  Dysphagia-  EGD - schatzki's ring dilatation on 4/14 poor intake- PEG placed    Acute resp distress-back to ICU Looks like fluid overload- watch closely as she is on dapto and risk for pneumonitis - No eosinophilia - follow CK -may Dc Dapto if  resp distress/pulmonary infiltrates persist  Anemia- multifactorial  ESRD- dialysis cath is occluded- no dialysis today  Poor intake/has PEG  S/p CVA  Discussed the management with her nurse

## 2019-06-02 NOTE — Plan of Care (Signed)

## 2019-06-02 NOTE — Progress Notes (Signed)
[facility] NAME: Crystal Vargas    MR#:  [id number]  DATE OF BIRTH:  10/02/1945  SUBJECTIVE:   Patient will got transferred to the ICU with declining overall condition. Worsening mentation and respiratory failure. Currently off BiPAP--on [place].  No family in the room. Patient awake. She is trying to say something but Clair Gulling unable to follow. Off bear hugger tube feeding started at a trickle  RN some issues with HD catheter clotting REVIEW OF SYSTEMS:   Review of Systems  Unable to perform ROS: Medical condition   Tolerating Diet: pt tolerates po meds Tolerating PT: bedbound  DRUG ALLERGIES:   Allergies  Allergen Reactions  . Abacavir Other (See Comments)  . Cephalosporins Other (See Comments)    Tolerated ceftaroline April 2021  . Ciprofloxacin Other (See Comments)  . Nsaids Other (See Comments)  . Penicillin G Other (See Comments)  . Quinolones Other (See Comments)  . Salicylates Other (See Comments)    VITALS:  Blood pressure (!) 114/50, pulse 67, temperature (!) 96.7 F (35.9 C), temperature source Axillary, resp. rate (!) 35, height 5\' 2"  (1.575 m), weight 115.7 kg, SpO2 100 %.  PHYSICAL EXAMINATION:   Physical Exam limited exam secondary patient participation  GENERAL:  74 y.o.-year-old patient lying in the bed with acute distress.  Chronically ill, deconditioned, critically ill on HFNC LUNGS: shallow breath sounds bilaterally, no wheezing, rales, rhonchi.  CARDIOVASCULAR: S1, S2 normal. No murmurs, rubs, or gallops.  ABDOMEN: Soft, nontender, nondistended.few Bowel sounds present. No organomegaly or mass. PEG+ abdominal binder present EXTREMITIES: No cyanosis, clubbing or edema b/l.   Chronic venous stasis changes + NEUROLOGIC: chronic left hemiparesis, muscle atrophy PSYCHIATRIC: severe lethargy SKIN:as below Pressure Injury 06/04/2019 Heel Left;Medial Unstageable - Full thickness tissue loss in which the base of  the injury is covered by slough (yellow, tan, gray, green or brown) and/or eschar (tan, brown or black) in the wound bed. unstagable with eshcar on le (Active)  06/05/2019 1830  Location: Heel  Location Orientation: Left;Medial  Staging: Unstageable - Full thickness tissue loss in which the base of the injury is covered by slough (yellow, tan, gray, green or brown) and/or eschar (tan, brown or black) in the wound bed.  Wound Description (Comments): unstagable with eshcar on left heel  Present on Admission: Yes     Pressure Injury 06/04/2019 Toe (Comment  which one) Left;Medial Deep Tissue Pressure Injury - Purple or maroon localized area of discolored intact skin or blood-filled blister due to damage of underlying soft tissue from pressure and/or shear. deep tissure (Active)  06/05/2019 1830  Location: Toe (Comment  which one) (left big toe)  Location Orientation: Left;Medial  Staging: Deep Tissue Pressure Injury - Purple or maroon localized area of discolored intact skin or blood-filled blister due to damage of underlying soft tissue from pressure and/or shear.  Wound Description (Comments): deep tissure injury on left big toe  Present on Admission: Yes     Pressure Injury 06/04/2019 Sacrum Medial Stage 2 -  Partial thickness loss of dermis presenting as a shallow open injury with a red, pink wound bed without slough. Pink (Active)  05/13/2019 1000  Location: Sacrum  Location Orientation: Medial  Staging: Stage 2 -  Partial thickness loss of dermis presenting as a shallow open injury with a red, pink wound bed without slough.  Wound Description (Comments): Pink  Present on Admission:     LABORATORY PANEL:  CBC Recent Labs  Lab 06/02/19 0442  WBC 13.8*  HGB 7.8*  HCT 23.3*  PLT 132*    Chemistries  Recent Labs  Lab 06/01/19 0344 06/01/19 0344 06/02/19 0442  NA 141   < > 138  K 3.2*   < > 3.0*  3.0*  CL 108   < > 107  CO2 25   < > 23  GLUCOSE 200*   < > 149*  BUN 20   < > 25*   CREATININE 2.18*   < > 2.45*  CALCIUM 8.5*   < > 8.1*  MG 1.8   < > 1.6*  AST 24  --   --   ALT 19  --   --   ALKPHOS 74  --   --   BILITOT 0.6  --   --    < > = values in this interval not displayed.   Cardiac Enzymes No results for input(s): TROPONINI in the last 168 hours. RADIOLOGY:  DG Chest 1 View  Result Date: 05/31/2019 CLINICAL DATA:  74 year old female with shortness of breath. Possible aspiration. EXAM: CHEST  1 VIEW COMPARISON:  Chest radiograph dated 06/02/2019. FINDINGS: Left IJ dialysis catheter in similar position. Interval placement of a right IJ central venous line with tip over central SVC. There is shallow inspiration with bilateral pleural effusions and bibasilar atelectasis. Diffuse vascular congestion and edema. Pneumonia is not excluded. Clinical correlation is recommended. No pneumothorax. There is atherosclerotic calcification of the aorta. No acute osseous pathology. IMPRESSION: 1. Bilateral pleural effusions with bibasilar atelectasis. 2. Diffuse vascular congestion and edema. Pneumonia is not excluded. Electronically Signed   By: Anner Crete M.D.   On: 05/31/2019 21:06   CT HEAD WO CONTRAST  Result Date: 05/31/2019 CLINICAL DATA:  Altered mental status. EXAM: CT HEAD WITHOUT CONTRAST TECHNIQUE: Contiguous axial images were obtained from the base of the skull through the vertex without intravenous contrast. COMPARISON:  May 10, 2019 FINDINGS: Brain: There is mild cerebral atrophy with widening of the extra-axial spaces and ventricular dilatation. There are areas of decreased attenuation within the white matter tracts of the supratentorial brain, consistent with microvascular disease changes. A large area of cortical encephalomalacia, with adjacent chronic white matter low attenuation, is seen within the right parietal lobe. Associated ex vacuo dilatation of the right lateral ventricle is seen. These findings are present on the prior exam. Vascular: No hyperdense  vessel or unexpected calcification. Skull: Normal. Negative for fracture or focal lesion. Sinuses/Orbits: There is mild sphenoid sinus mucosal thickening. Other: None. IMPRESSION: 1. Generalized cerebral atrophy. 2. Large, chronic right parietal lobe infarct. 3. No acute intracranial abnormality. Electronically Signed   By: Virgina Norfolk M.D.   On: 05/31/2019 21:34   MR BRAIN WO CONTRAST  Result Date: 06/01/2019 CLINICAL DATA:  Initial evaluation for acute encephalopathy. EXAM: MRI HEAD WITHOUT CONTRAST TECHNIQUE: Multiplanar, multiecho pulse sequences of the brain and surrounding structures were obtained without intravenous contrast. COMPARISON:  Prior head CT from 05/31/2019. FINDINGS: Brain: Examination technically limited as the patient's vital signs peaking unstable during the course of the exam. Examination was terminated prematurely. Axial DWI sequence only was performed. Diffusion-weighted imaging demonstrates no evidence for acute or subacute ischemia. Large area of encephalomalacia of involving the right cerebral hemisphere compatible with a chronic right MCA territory infarct. No other visible areas of chronic infarction. No appreciable mass lesion, mass effect, or midline shift on this limited exam. Ex vacuo dilatation of the right lateral ventricle related to the chronic right  cerebral encephalomalacia. No hydrocephalus. No extra-axial fluid collection. No obvious acute intracranial abnormality on this limited exam. Vascular: Not assessed on this limited exam. Skull and upper cervical spine: Not well assessed on this limited exam. Sinuses/Orbits: Not well assessed on this limited exam. Other: None. IMPRESSION: 1. Technically limited exam with only axial DWI sequence performed. Patient's vital signs became unstable during the course of the study, which as a result was terminated early. 2. No evidence for acute or subacute infarct. No other definite acute intracranial abnormality on this limited  study. 3. Chronic right MCA territory infarct. Electronically Signed   By: Jeannine Boga M.D.   On: 06/01/2019 02:13   ASSESSMENT AND PLAN:  Crystal Vargas is a 74 y.o.femalewho is bed bound and from SNF with history ofend-stage renal disease recently started on hemodialysis, history of right MCA stroke, history of dysphagia due to Schatzki's ring and is status post esophageal dilatation who was admitted with altered mental status.Her daughter noted poor PO intake thought to be due to dysphagia.   Acute metabolic encephalopathy with acute on chronic hypoxic/hyper cardiac respiratory failure with bilateral pleural effusion? Questionable pneumonia/? Aspiration /sepsis/pulmonary edema with volume overload -patient transfer to ICU -was on BiPAP--now on [place]  -had hypothermia-- was placed on bear hugger -appreciate ICU attending input-- patient overall has a poor prognosis -elevated lactate 3.8--4.9 -not on pressers currently - Last hemodialysis was done on 4/23-- 1400 output -currently is hemodynamically unstable for any dialysis per nephrology  Hypoglycemia--resolved -has history of diabetes. -Patient remained on dextrose drip temporarily. Now discontinued  MRSA bacteremia/ septic shock -due to infected dialysis cath - appreciate ID assistance- currently on Daptomycin and Teflaro- awating approval for dapto -CK 16--166--164 - repeat cultures 4/9 negative - TEE negative for vegetation -s/p  IV Hickman catheter placement by Dr schnier on 05/23/2019 for Teflaro for minimal 6 wks (last dose 06/20/2019)  Dysphagia- chronic issue - EGD performed 4/14 revealed and esophageal stenosis that was dilated -on tube feeding-- resumed at low rate  Poor oral intake  eating < 50 % of meals  - 4/17> continue to barely eat meals - 4/17>- added Megace- increased Nepro- started calorie count - 4/20> she is wanting a feeding tube-- s/p PEG tube  placed By Dr Allen Norris on 4/22 - 4/23> TF started tolerating  well -4/25--held due to decline in condition --4/26now resumed at low rate  ESRD on HD - infected cath removed and tunnelled HD cath placed on 4/12 -cont dialysis per renal team - MWF dialysis with [facility] -last HD 4/23 -4/26> HD cath clotted--declotted using TPA  HTN - meds being titrated by Renal  Acute anemia (possible related to infection/ acute illness) in addition to CKD - has been transfused 3 U PRBC so far - 4/3 (1 U) and 4/12 ( 2 U) -hemoglobin 8.5   Diabetes Mellitus 2 with ESRD  - A1c checked on 4/3 was 5.6 and she was not on medications as outpt -- patient was found to have hypoglycemia this morning. Currently on D10 drip   Status post CVA - bed bound with left sided weakness (0/5) and left facial droop - resume plavix - cont Statin  Seizure disorder - cont  Keppra  Depression - on Remeron and Zoloft  Pt was seen by Palliative care on 05/12/2019--Family wants everything done.  Patient was seen by palliative care on4/26/2021-- family not interested.  Dr. Juleen China spoke with patient's daughter [name]. She wants to continue aggressive care for now including HD  DVT prophylaxis: SCDs Code Status: Full code Family Communication:  dr Darlina Sicilian has spoken with  with Lattie Haw and [name] daughters Disposition Plan:  to be determined. Consultants: Nephro,Cardiology,GI,ID,Palliative care Procedures:  4/14- EGDBenign-appearing esophageal stenosis 4/12- left IJ 2 D ECHO 4/22 PEG tube by Dr Allen Norris  Status is: Inpatient Dispo: The patient is from:rehab              Anticipated d/c is to her SNF              Anticipated d/c date is:TBD even deterioration of patient's condition and being transferred back to the ICU              Patient currently unstable for discharge given multiple co-morbidities currently in the ICU. long term prognosis poor  TOTAL TIME TAKING CARE OF THIS PATIENT: 35* minutes.  >50% time spent on counselling and coordination of care  Note:  This dictation was prepared with Dragon dictation along with smaller phrase technology. Any transcriptional errors that result from this process are unintentional.  Fritzi Mandes M.D    [facility]   CC: Primary care physician; Marisa Hua, MDPatient ID: Crystal Vargas, female   DOB: 04/13/1945, 74 y.o.   MRN: [id number]

## 2019-06-02 NOTE — Progress Notes (Addendum)
Shift summary:  - Patient off of BiPAP at 1145 hrs this AM. - Plan for H/D this afternoon. HD cath requiring TPA. - FMS remains in place with high stool output.

## 2019-06-02 NOTE — Progress Notes (Signed)
Pre HD  Pt received in ICU 6, alert with daughter at bedside. Pt on O2 via [place] at 2L, SpO2 98-100%. Upon accessing CVC, no blood teturn noted from venous line. Alteplase placed at 1306. Results pending at this time.    06/02/19 1300  Vital Signs  Temp 98.2 F (36.8 C)  Temp Source Axillary  Pulse Rate 72  Pulse Rate Source Monitor  Resp 17  BP (!) 119/52  BP Location Right Arm  BP Method Automatic  Patient Position (if appropriate) Lying  Oxygen Therapy  SpO2 100 %  O2 Device Nasal Cannula  O2 Flow Rate (L/min) 2 L/min  Pain Assessment  Pain Scale 0-10  Pain Score 0  Dialysis Weight  Weight  (Unable to weigh pt)  Type of Weight Pre-Dialysis  Time-Out for Hemodialysis  What Procedure? HD   Pt Identifiers(min of two) First/Last Name;MRN/Account#;Pt's DOB(use if MRN/Acct# not available  Correct Site? Yes  Correct Side? Yes  Correct Procedure? Yes  Consents Verified? Yes  Rad Studies Available? N/A  Safety Precautions Reviewed? Yes  Engineer, civil (consulting) Number 2  Station Number  Comptroller)  UF/Alarm Test Passed  Conductivity: Meter 14  Conductivity: Machine  13.9  pH 7.2  Reverse Osmosis  (WRO1)  Normal Saline Lot Number [id number]  Dialyzer Lot Number [id number]  Disposable Set Lot Number [id number]  Machine Temperature 98.6 F (37 C)  Musician and Audible Yes  Blood Lines Intact and Secured Yes  Pre Treatment Patient Checks  Vascular access used during treatment Catheter  HD catheter dressing before treatment WDL  Patient is receiving dialysis in a chair  (no )  Hepatitis B Surface Antigen Results Negative  Date Hepatitis B Surface Antigen Drawn 05/11/19  Isolation Initiated Yes  Hemodialysis Consent Verified Yes  Hemodialysis Standing Orders Initiated Yes  ECG (Telemetry) Monitor On Yes  Prime Ordered Normal Saline  Length of  DialysisTreatment -hour(s) 2.5 Hour(s)  Dialysis Treatment Comments  (Na 140)  Dialyzer Elisio 17H NR  Dialysate 2K;2.5 Ca   Dialysate Flow Ordered 500  Blood Flow Rate Ordered 300 mL/min  Ultrafiltration Goal 0 Liters  Dialysis Blood Pressure Support Ordered Normal Saline  Education / Care Plan  Dialysis Education Provided Yes  Documented Education in Care Plan Yes  Outpatient Plan of Care Reviewed and on Chart Yes  Hemodialysis Catheter Left Internal jugular  Placement Date/Time: 06/02/2019 1706   Placed prior to admission: No  Time Out: Correct patient;Correct site;Correct procedure  Maximum sterile barrier precautions: Hand hygiene;Mask;Sterile gown;Cap;Sterile gloves;Large sterile sheet  Site Prep: Chlorh...  Site Condition No complications  Blue Lumen Status No blood return  Red Lumen Status Blood return noted  Purple Lumen Status N/A  Dressing Type Gauze/Drain sponge;Occlusive  Dressing Status Clean;Dry;Intact  Interventions  (none)  Drainage Description None

## 2019-06-02 NOTE — Progress Notes (Signed)
Pre HD Assessment    06/02/19 1337  Neurological  Level of Consciousness Alert  Orientation Level Oriented to person  Respiratory  Respiratory Pattern Regular;Unlabored  Chest Assessment Chest expansion symmetrical  Bilateral Breath Sounds Clear;Diminished  Cardiac  Pulse Regular (SVPB)  Heart Sounds S1, S2  Jugular Venous Distention (JVD) No  Cardiac Rhythm NSR  Vascular  R Radial Pulse +2  L Radial Pulse +2  Integumentary  Integumentary (WDL) X  Skin Condition Other (Comment) (odor)  Musculoskeletal  Musculoskeletal (WDL) X  Gastrointestinal  Bowel Sounds Assessment Active  Psychosocial  Psychosocial (WDL) X  Patient Behaviors Calm

## 2019-06-02 NOTE — Progress Notes (Signed)
OT Cancellation Note  Patient Details Name: Kayliegh Boyers MRN: [id number] DOB: 06-03-1945   Cancelled Treatment:    Reason Eval/Treat Not Completed: Medical issues which prohibited therapy. Per chart review, patient noted with transfer to higher level of care due to change in medical condition.  Per guidelines, patient requires new orders to resume OT services.  Please reconsult when medically appropriate.  Jeni Salles, MPH, MS, OTR/L ascom [phone] 06/02/19, 9:58 AM

## 2019-06-02 NOTE — Consult Note (Signed)
Pharmacy Antibiotic Note  Crystal Vargas is a 74 y.o. female admitted on 06/02/2019 with MRSA bacteremia due to an infected HD catheter.  Pharmacy has been consulted for Daptomycin and Aztreonam.  Patient receives hemodialysis on Monday, Wednesday and Friday. Dr Lucky Cowboy placed a PermCath 4/12. TEE is negative for vegetation. ID recommends completion of IV antibiotics through 06/20/19.   -Aztreonam added 4/24 for ?HCAP.  -Also on Ceftaroline per ID  Plan: Daptomycin:   Continue daptomycin 500 mg (~8 mg/kg) IV q48 hours in HD pt  CK once weekly: next 4/28 (range since admission: 10 - 166 U/L): atorvastatin has been held by attending physician due to uptrend in CK  Ceftaroline   Continue ceftaroline 200 mg q8H for MRSA bacteremia dosing in HD patients.   Aztreonam  Discontinued 4/25   End date 5/14 for both Daptomycin and Ceftaroline per ID.   Pharmacy will continue to dose and adjust per consult.   Height: 5\' 2"  (157.5 cm) Weight: (Unable to weigh pt) IBW/kg (Calculated) : 50.1  Temp (24hrs), Avg:96.6 F (35.9 C), Min:94.8 F (34.9 C), Max:98.2 F (36.8 C)  Recent Labs  Lab 05/12/2019 0654 05/31/19 2025 06/01/19 0015 06/01/19 0344 06/01/19 0742 06/01/19 1022 06/02/19 0442  WBC  --  13.4*  --  11.9* 13.1*  --  13.8*  CREATININE 2.54* 2.18*  --  2.18*  --   --  2.45*  LATICACIDVEN  --   --  3.8*  --   --  4.9*  --     Estimated Creatinine Clearance: 23.5 mL/min (A) (by C-G formula based on SCr of 2.45 mg/dL (H)).    Antimicrobials this admission:  Azactam/Flagyl 4/24 >> vancomycin 4/3 >> 4/8 aztreonam 4/3 x 1 daptomycin 4/7 >> ceftaroline 4/12 >> Aztreonam 4/24 >> 4/25  Microbiology results: 4/9 BCx: NG final 4/7 BCx: S aureus 4/7 Cath Tip: no growth final  4/6 Bcx: GPC 1/4 (anaerobic bottle) 4/4 Bcx:  Staph aureus (A-line) 4/5 Bcx:  Staph aureus 4/3 BCx: MRSA 4/4 Cath tip:  MRSA 4/3 UCx: Kleb - Likely contamination  4/3 MRSA PCR: negative 4/3 Flu A&B, Covid  (nasopharyngeal swab):  negative  Thank you for allowing pharmacy to be a part of this patient's care.  Paulina Fusi, PharmD, BCPS 06/02/2019 1:30 PM

## 2019-06-02 NOTE — Progress Notes (Signed)
[facility]  ROUNDING NOTE   Subjective:   Unresponsive. Requiring warming blanket.   Objective:  Vital signs in last 24 hours:  Temp:  [94.8 F (34.9 C)-97.4 F (36.3 C)] 96.7 F (35.9 C) (04/26 0818) Pulse Rate:  [48-64] 55 (04/26 0900) Resp:  [10-27] 20 (04/26 0900) BP: (89-129)/(35-101) 111/51 (04/26 0800) SpO2:  [99 %-100 %] 100 % (04/26 0900) FiO2 (%):  [30 %] 30 % (04/26 0800)  Weight change:  Filed Weights   06/02/2019 0500 05/30/19 1010 05/31/19 0611  Weight: 70.4 kg 65.2 kg 115.7 kg    Intake/Output: I/O last 3 completed shifts: In: 3185.3 [I.V.:1599.1; Other:120; NG/GT:60; IV Piggyback:1406.2] Out: 550 [Stool:550]   Intake/Output this shift:  Total I/O In: 325.6 [I.V.:105.3; IV Piggyback:220.3] Out: -   Physical Exam: General: Critically ill  Head: BIPAP  Eyes: Anicteric, PERRL  Neck: Supple, trachea midline  Lungs:  Clear to auscultation  Heart: bradycardia  Abdomen:  Soft, nontender, +PEG  Extremities: no peripheral edema.  Neurologic: Not following commands, lethargic  Skin: No lesions  Access: LIJ permcath    Basic Metabolic Panel: Recent Labs  Lab 05/09/2019 1558 05/24/2019 0654 05/18/2019 0654 05/30/19 1236 05/31/19 0454 05/31/19 2025 06/01/19 0344 06/02/19 0442  NA  --  141  --   --   --  137 141 138  K  --  3.5  --   --  3.2* 3.7 3.2* 3.0*  3.0*  CL  --  109  --   --   --  104 108 107  CO2  --  27  --   --   --  27 25 23   GLUCOSE  --  130*  --   --   --  460* 200* 149*  BUN  --  17  --   --   --  18 20 25*  CREATININE  --  2.54*  --   --   --  2.18* 2.18* 2.45*  CALCIUM  --  8.2*   < >  --   --  8.1* 8.5* 8.1*  MG  --   --   --  1.5* 2.0 1.9 1.8 1.6*  PHOS   < >  --   --  1.8* 5.0* 4.8* 3.9 3.2  3.1   < > = values in this interval not displayed.    Liver Function Tests: Recent Labs  Lab 05/31/19 2025 06/01/19 0344 06/02/19 0442  AST 23 24  --   ALT 22 19  --   ALKPHOS 72 74  --   BILITOT 0.6 0.6  --   PROT  5.9* 5.6*  --   ALBUMIN 1.8* 1.6* 1.5*   Recent Labs  Lab 05/31/19 2025 05/31/19 2140 06/01/19 0344  LIPASE 117*  --  105*  AMYLASE  --  133* 136*   Recent Labs  Lab 06/01/19 0056  AMMONIA 42*    CBC: Recent Labs  Lab 05/31/19 2025 06/01/19 0344 06/01/19 0742 06/02/19 0442  WBC 13.4* 11.9* 13.1* 13.8*  NEUTROABS  --  10.1*  --   --   HGB 8.1* 7.6* 8.5* 7.8*  HCT 26.2* 25.5* 28.1* 23.3*  MCV 97.0 98.8 99.3 90.7  PLT 135* 115* 114* 132*    Cardiac Enzymes: Recent Labs  Lab 05/25/2019 0654 05/30/19 1236  CKTOTAL 166 124    BNP: Invalid input(s): POCBNP  CBG: Recent Labs  Lab 06/01/19 1915 06/01/19 2021 06/02/19 0027 06/02/19 0349 06/02/19 0728  GLUCAP 101* 98 107*  132* 145*    Microbiology: Results for orders placed or performed during the hospital encounter of 05/09/2019  Blood culture (routine x 2)     Status: Abnormal   Collection Time: 05/12/2019 12:34 PM   Specimen: BLOOD  Result Value Ref Range Status   Specimen Description   Final    BLOOD R UP ARM Performed at [facility], [address], [place], [place] [address]    Special Requests   Final    BOTTLES DRAWN AEROBIC AND ANAEROBIC Blood Culture adequate volume Performed at [facility], [address] [place], [place] [address]    Culture  Setup Time   Final    Organism ID to follow IN BOTH AEROBIC AND ANAEROBIC BOTTLES GRAM POSITIVE COCCI CRITICAL RESULT CALLED TO, READ BACK BY AND VERIFIED WITH: [place] ON 05/11/19 AT 0210 [facility] Performed at [facility], [address] [place], [place] [address]    Culture (A)  Final    STAPHYLOCOCCUS AUREUS SUSCEPTIBILITIES PERFORMED ON PREVIOUS CULTURE WITHIN THE LAST 5 DAYS. Performed at [facility], [place] [address], [place], [place] [address]    Report Status 05/13/2019 FINAL  Final  Blood Culture ID Panel (Reflexed)     Status: Abnormal   Collection Time: 06/02/2019 12:34 PM  Result Value Ref Range Status    Enterococcus species NOT DETECTED NOT DETECTED Final   Listeria monocytogenes NOT DETECTED NOT DETECTED Final   Staphylococcus species DETECTED (A) NOT DETECTED Final    Comment: CRITICAL RESULT CALLED TO, READ BACK BY AND VERIFIED WITH: SCOTT HALL ON 05/11/19 AT 0210 [facility]    Staphylococcus aureus (BCID) DETECTED (A) NOT DETECTED Final    Comment: Methicillin (oxacillin)-resistant Staphylococcus aureus (MRSA). MRSA is predictably resistant to beta-lactam antibiotics (except ceftaroline). Preferred therapy is vancomycin unless clinically contraindicated. Patient requires contact precautions if  hospitalized. CRITICAL RESULT CALLED TO, READ BACK BY AND VERIFIED WITH: SCOTT HALL ON 05/11/19 AT 0210 [facility]    Methicillin resistance DETECTED (A) NOT DETECTED Final    Comment: CRITICAL RESULT CALLED TO, READ BACK BY AND VERIFIED WITH: SCOTT HALL ON 05/11/19 AT 0210 [place]    Streptococcus species NOT DETECTED NOT DETECTED Final   Streptococcus agalactiae NOT DETECTED NOT DETECTED Final   Streptococcus pneumoniae NOT DETECTED NOT DETECTED Final   Streptococcus pyogenes NOT DETECTED NOT DETECTED Final   Acinetobacter baumannii NOT DETECTED NOT DETECTED Final   Enterobacteriaceae species NOT DETECTED NOT DETECTED Final   Enterobacter cloacae complex NOT DETECTED NOT DETECTED Final   Escherichia coli NOT DETECTED NOT DETECTED Final   Klebsiella oxytoca NOT DETECTED NOT DETECTED Final   Klebsiella pneumoniae NOT DETECTED NOT DETECTED Final   Proteus species NOT DETECTED NOT DETECTED Final   Serratia marcescens NOT DETECTED NOT DETECTED Final   Haemophilus influenzae NOT DETECTED NOT DETECTED Final   Neisseria meningitidis NOT DETECTED NOT DETECTED Final   Pseudomonas aeruginosa NOT DETECTED NOT DETECTED Final   Candida albicans NOT DETECTED NOT DETECTED Final   Candida glabrata NOT DETECTED NOT DETECTED Final   Candida krusei NOT DETECTED NOT DETECTED Final   Candida parapsilosis NOT DETECTED NOT DETECTED  Final   Candida tropicalis NOT DETECTED NOT DETECTED Final    Comment: Performed at [facility], [address] [place], [place] [address]  Blood culture (routine x 2)     Status: Abnormal   Collection Time: 05/23/2019  1:24 PM   Specimen: BLOOD  Result Value Ref Range Status  Specimen Description   Final    BLOOD R LATREAL BICEP Performed at [facility], [address], [place], [place] [address]    Special Requests   Final    BOTTLES DRAWN AEROBIC AND ANAEROBIC Blood Culture adequate volume Performed at [facility], [address] [place], [place] [address]    Culture  Setup Time   Final    IN BOTH AEROBIC AND ANAEROBIC BOTTLES GRAM POSITIVE COCCI CRITICAL VALUE NOTED.  VALUE IS CONSISTENT WITH PREVIOUSLY REPORTED AND CALLED VALUE. Performed at [facility], [address] [place], [place] [address]    Culture METHICILLIN RESISTANT STAPHYLOCOCCUS AUREUS (A)  Final   Report Status 05/13/2019 FINAL  Final   Organism ID, Bacteria METHICILLIN RESISTANT STAPHYLOCOCCUS AUREUS  Final      Susceptibility   Methicillin resistant staphylococcus aureus - MIC*    CIPROFLOXACIN >=8 RESISTANT Resistant     ERYTHROMYCIN >=8 RESISTANT Resistant     GENTAMICIN <=0.5 SENSITIVE Sensitive     OXACILLIN >=4 RESISTANT Resistant     TETRACYCLINE <=1 SENSITIVE Sensitive     VANCOMYCIN <=0.5 SENSITIVE Sensitive     TRIMETH/SULFA >=320 RESISTANT Resistant     CLINDAMYCIN <=0.25 SENSITIVE Sensitive     RIFAMPIN <=0.5 SENSITIVE Sensitive     Inducible Clindamycin NEGATIVE Sensitive     * METHICILLIN RESISTANT STAPHYLOCOCCUS AUREUS  Respiratory Panel by RT PCR (Flu A&B, Covid) - Nasopharyngeal Swab     Status: None   Collection Time: 05/16/2019  1:24 PM   Specimen: Nasopharyngeal Swab  Result Value Ref Range Status   SARS Coronavirus 2 by RT PCR NEGATIVE NEGATIVE Final    Comment: (NOTE) SARS-CoV-2 target nucleic acids are NOT DETECTED. The SARS-CoV-2 RNA  is generally detectable in upper respiratoy specimens during the acute phase of infection. The lowest concentration of SARS-CoV-2 viral copies this assay can detect is 131 copies/mL. A negative result does not preclude SARS-Cov-2 infection and should not be used as the sole basis for treatment or other patient management decisions. A negative result may occur with  improper specimen collection/handling, submission of specimen other than nasopharyngeal swab, presence of viral mutation(s) within the areas targeted by this assay, and inadequate number of viral copies (<131 copies/mL). A negative result must be combined with clinical observations, patient history, and epidemiological information. The expected result is Negative. Fact Sheet for Patients:  PinkCheek.be Fact Sheet for Healthcare Providers:  GravelBags.it This test is not yet ap proved or cleared by the [place] FDA and  has been authorized for detection and/or diagnosis of SARS-CoV-2 by FDA under an Emergency Use Authorization (EUA). This EUA will remain  in effect (meaning this test can be used) for the duration of the COVID-19 declaration under Section 564(b)(1) of the Act, 21 U.S.C. section 360bbb-3(b)(1), unless the authorization is terminated or revoked sooner.    Influenza A by PCR NEGATIVE NEGATIVE Final   Influenza B by PCR NEGATIVE NEGATIVE Final    Comment: (NOTE) The Xpert Xpress SARS-CoV-2/FLU/RSV assay is intended as an aid in  the diagnosis of influenza from Nasopharyngeal swab specimens and  should not be used as a sole basis for treatment. Nasal washings and  aspirates are unacceptable for Xpert Xpress SARS-CoV-2/FLU/RSV  testing. Fact Sheet for Patients: PinkCheek.be Fact Sheet for Healthcare Providers: GravelBags.it This test is not yet approved or cleared by the [place] FDA and   has been authorized for detection and/or diagnosis of SARS-CoV-2 by  FDA under an Emergency  Use Authorization (EUA). This EUA will remain  in effect (meaning this test can be used) for the duration of the  Covid-19 declaration under Section 564(b)(1) of the Act, 21  U.S.C. section 360bbb-3(b)(1), unless the authorization is  terminated or revoked. Performed at [facility], [address] [place], [place] [address]   MRSA PCR Screening     Status: None   Collection Time: 06/02/2019  6:27 PM   Specimen: Nasopharyngeal  Result Value Ref Range Status   MRSA by PCR NEGATIVE NEGATIVE Final    Comment:        The GeneXpert MRSA Assay (FDA approved for NASAL specimens only), is one component of a comprehensive MRSA colonization surveillance program. It is not intended to diagnose MRSA infection nor to guide or monitor treatment for MRSA infections. Performed at [facility], [address] [place], [place] [address]   Urine culture     Status: Abnormal   Collection Time: 05/18/2019 10:30 PM   Specimen: Urine, Clean Catch  Result Value Ref Range Status   Specimen Description   Final    URINE, CLEAN CATCH Performed at [facility], [address] [place], [place] [address]    Special Requests   Final    NONE Performed at [facility], [place], [place] [address]    Culture (A)  Final    >=100,000 COLONIES/mL KLEBSIELLA PNEUMONIAE >=100,000 COLONIES/mL VANCOMYCIN RESISTANT ENTEROCOCCUS    Report Status 05/14/2019 FINAL  Final   Organism ID, Bacteria KLEBSIELLA PNEUMONIAE (A)  Final   Organism ID, Bacteria VANCOMYCIN RESISTANT ENTEROCOCCUS (A)  Final      Susceptibility   Klebsiella pneumoniae - MIC*    AMPICILLIN >=32 RESISTANT Resistant     CEFAZOLIN <=4 SENSITIVE Sensitive     CEFTRIAXONE <=0.25 SENSITIVE Sensitive     CIPROFLOXACIN <=0.25 SENSITIVE Sensitive     GENTAMICIN <=1 SENSITIVE Sensitive     IMIPENEM 0.5  SENSITIVE Sensitive     NITROFURANTOIN 64 INTERMEDIATE Intermediate     TRIMETH/SULFA <=20 SENSITIVE Sensitive     AMPICILLIN/SULBACTAM 4 SENSITIVE Sensitive     PIP/TAZO <=4 SENSITIVE Sensitive     * >=100,000 COLONIES/mL KLEBSIELLA PNEUMONIAE   Vancomycin resistant enterococcus - MIC*    AMPICILLIN >=32 RESISTANT Resistant     NITROFURANTOIN 256 RESISTANT Resistant     VANCOMYCIN >=32 RESISTANT Resistant     LINEZOLID 2 SENSITIVE Sensitive     * >=100,000 COLONIES/mL VANCOMYCIN RESISTANT ENTEROCOCCUS  Cath Tip Culture     Status: Abnormal   Collection Time: 05/11/19  1:10 PM   Specimen: Catheter Tip; Other  Result Value Ref Range Status   Specimen Description   Final    CATH TIP Performed at [facility], [place] [address] [place], [place] [address]    Special Requests   Final    NONE Performed at [facility], [address] [place],  [address]    Culture (A)  Final    >=100,000 COLONIES/mL METHICILLIN RESISTANT STAPHYLOCOCCUS AUREUS   Report Status 05/13/2019 FINAL  Final   Organism ID, Bacteria METHICILLIN RESISTANT STAPHYLOCOCCUS AUREUS (A)  Final      Susceptibility   Methicillin resistant staphylococcus aureus - MIC*    CIPROFLOXACIN >=8 RESISTANT Resistant     ERYTHROMYCIN >=8 RESISTANT Resistant     GENTAMICIN <=0.5 SENSITIVE Sensitive     OXACILLIN >=4 RESISTANT Resistant     TETRACYCLINE <=1 SENSITIVE Sensitive     VANCOMYCIN  1 SENSITIVE Sensitive     TRIMETH/SULFA >=320 RESISTANT Resistant     CLINDAMYCIN <=0.25 SENSITIVE Sensitive     RIFAMPIN <=0.5 SENSITIVE Sensitive     Inducible Clindamycin NEGATIVE Sensitive     * >=100,000 COLONIES/mL METHICILLIN RESISTANT STAPHYLOCOCCUS AUREUS  Culture, blood (routine x 2)     Status: Abnormal   Collection Time: 05/11/19  4:27 PM   Specimen: BLOOD  Result Value Ref Range Status   Specimen Description   Final    BLOOD A-LINE Performed at [facility], [address],  [place], [place] [address]    Special Requests   Final    BOTTLES DRAWN AEROBIC AND ANAEROBIC Blood Culture adequate volume Performed at [facility], [address] [place], [place] [address]    Culture  Setup Time   Final    GRAM POSITIVE COCCI ANAEROBIC BOTTLE ONLY CRITICAL VALUE NOTED.  VALUE IS CONSISTENT WITH PREVIOUSLY REPORTED AND CALLED VALUE. Performed at [facility], [address] [place], [place] [address]    Culture (A)  Final    STAPHYLOCOCCUS AUREUS SUSCEPTIBILITIES PERFORMED ON PREVIOUS CULTURE WITHIN THE LAST 5 DAYS. Performed at [facility], [place] [address] [place], [place] [address]    Report Status 05/09/2019 FINAL  Final  Culture, blood (routine x 2)     Status: Abnormal   Collection Time: 05/12/19  6:06 AM   Specimen: BLOOD  Result Value Ref Range Status   Specimen Description BLOOD BLOOD RIGHT HAND  Final   Special Requests   Final    BOTTLES DRAWN AEROBIC AND ANAEROBIC Blood Culture adequate volume   Culture  Setup Time   Final    GRAM POSITIVE COCCI ANAEROBIC BOTTLE ONLY CRITICAL RESULT CALLED TO, READ BACK BY AND VERIFIED WITH: DAVID BESANTI AT 10PM ON 05/12/2019 BY MOSLEY,J    Culture METHICILLIN RESISTANT STAPHYLOCOCCUS AUREUS (A)  Final   Report Status 05/13/2019 FINAL  Final   Organism ID, Bacteria METHICILLIN RESISTANT STAPHYLOCOCCUS AUREUS  Final      Susceptibility   Methicillin resistant staphylococcus aureus - MIC*    CIPROFLOXACIN >=8 RESISTANT Resistant     ERYTHROMYCIN >=8 RESISTANT Resistant     GENTAMICIN <=0.5 SENSITIVE Sensitive     OXACILLIN >=4 RESISTANT Resistant     TETRACYCLINE <=1 SENSITIVE Sensitive     VANCOMYCIN 1 SENSITIVE Sensitive     TRIMETH/SULFA >=320 RESISTANT Resistant     CLINDAMYCIN <=0.25 SENSITIVE Sensitive     RIFAMPIN <=0.5 SENSITIVE Sensitive     Inducible Clindamycin NEGATIVE Sensitive     LINEZOLID Value in next row Sensitive      SENSITIVE2Performed at [facility] [address] [place], [place] [address]    * METHICILLIN RESISTANT STAPHYLOCOCCUS AUREUS  CULTURE, BLOOD (ROUTINE X 2) w Reflex to ID Panel     Status: Abnormal   Collection Time: 05/13/19  2:20 PM   Specimen: BLOOD  Result Value Ref Range Status   Specimen Description   Final    BLOOD BLOOD RIGHT HAND Performed at [facility], [address] [place], [place] [address]    Special Requests   Final    BOTTLES DRAWN AEROBIC AND ANAEROBIC Blood Culture adequate volume Performed at [facility], [address] [place], [place] [address]    Culture  Setup Time   Final    GRAM POSITIVE COCCI ANAEROBIC BOTTLE ONLY CRITICAL RESULT CALLED TO, READ BACK BY AND VERIFIED WITH: CHRISTINE KATSOUDAS  AT 9562 05/14/19 SDR Performed at [facility], [address] [place], [place] [address]    Culture (A)  Final    STAPHYLOCOCCUS AUREUS SUSCEPTIBILITIES PERFORMED ON PREVIOUS CULTURE WITHIN THE LAST 5 DAYS. Performed at [facility], [place] [address] [place], [place] [address]    Report Status 05/16/2019 FINAL  Final  CULTURE, BLOOD (ROUTINE X 2) w Reflex to ID Panel     Status: None   Collection Time: 05/13/19  3:39 PM   Specimen: BLOOD  Result Value Ref Range Status   Specimen Description BLOOD BLOOD LEFT HAND  Final   Special Requests   Final    BOTTLES DRAWN AEROBIC ONLY Blood Culture adequate volume   Culture   Final    NO GROWTH 5 DAYS Performed at [facility], [address] [place], [place] [address]    Report Status 05/18/2019 FINAL  Final  CULTURE, BLOOD (ROUTINE X 2) w Reflex to ID Panel     Status: Abnormal   Collection Time: 05/14/19  3:33 PM   Specimen: BLOOD  Result Value Ref Range Status   Specimen Description   Final    BLOOD BLOOD RIGHT HAND Performed at [facility], [address], [place], [place] [address]    Special Requests   Final    BOTTLES DRAWN AEROBIC AND ANAEROBIC Blood Culture adequate  volume Performed at [facility], [address] [place], [place] [address]    Culture  Setup Time   Final    GRAM POSITIVE COCCI IN CLUSTERS ANAEROBIC BOTTLE ONLY CRITICAL VALUE NOTED.  VALUE IS CONSISTENT WITH PREVIOUSLY REPORTED AND CALLED VALUE.    Culture (A)  Final    STAPHYLOCOCCUS AUREUS SEE SEPARATE REPORT IN [facility] FOR DOS 05/14/19. Performed at [facility], [place] [address], [place], [place] [address]    Report Status 05/24/2019 FINAL  Final  Cath Tip Culture     Status: None   Collection Time: 05/14/19  4:24 PM   Specimen: Catheter Tip; Other  Result Value Ref Range Status   Specimen Description   Final    CATH TIP Performed at [facility], [address], [place], [place] [address]    Special Requests   Final    NONE Performed at [facility], [address] [place], [place] [address]    Culture   Final    NO GROWTH 3 DAYS Performed at [facility], [place] [address], [place], [place] [address]    Report Status 05/17/2019 FINAL  Final  CULTURE, BLOOD (ROUTINE X 2) w Reflex to ID Panel     Status: Abnormal   Collection Time: 05/14/19  6:25 PM   Specimen: BLOOD  Result Value Ref Range Status   Specimen Description   Final    BLOOD BLOOD LEFT HAND Performed at [facility], [address] [place], [place] [address]    Special Requests   Final    BOTTLES DRAWN AEROBIC AND ANAEROBIC Blood Culture results may not be optimal due to an inadequate volume of blood received in culture bottles Performed at [facility], [address], [place],  [address]    Culture  Setup Time   Final    GRAM POSITIVE COCCI ANAEROBIC BOTTLE ONLY CRITICAL VALUE NOTED.  VALUE IS CONSISTENT WITH PREVIOUSLY REPORTED AND CALLED VALUE. [facility] Performed at [facility], [address], [place],  [address]    Culture (A)  Final  STAPHYLOCOCCUS AUREUS SUSCEPTIBILITIES PERFORMED ON PREVIOUS CULTURE WITHIN THE  LAST 5 DAYS. Performed at [facility], [place] [address], [place], [place] [address]    Report Status 05/17/2019 FINAL  Final  Culture, blood (Routine X 2) w Reflex to ID Panel     Status: None   Collection Time: 05/16/19 10:45 AM   Specimen: BLOOD  Result Value Ref Range Status   Specimen Description BLOOD RIGHT HAND  Final   Special Requests BLOOD Blood Culture adequate volume  Final   Culture   Final    NO GROWTH 5 DAYS Performed at [facility], [address] [place], [place] [address]    Report Status 05/11/2019 FINAL  Final  Culture, blood (Routine X 2) w Reflex to ID Panel     Status: None   Collection Time: 05/16/19 12:38 PM   Specimen: BLOOD  Result Value Ref Range Status   Specimen Description BLOOD RT HAND  Final   Special Requests   Final    BOTTLES DRAWN AEROBIC AND ANAEROBIC Blood Culture adequate volume   Culture   Final    NO GROWTH 5 DAYS Performed at [facility], [address] [place], [place] [address]    Report Status 05/17/2019 FINAL  Final  Respiratory Panel by RT PCR (Flu A&B, Covid) - Nasopharyngeal Swab     Status: None   Collection Time: 06/01/2019 12:38 PM   Specimen: Nasopharyngeal Swab  Result Value Ref Range Status   SARS Coronavirus 2 by RT PCR NEGATIVE NEGATIVE Final    Comment: (NOTE) SARS-CoV-2 target nucleic acids are NOT DETECTED. The SARS-CoV-2 RNA is generally detectable in upper respiratoy specimens during the acute phase of infection. The lowest concentration of SARS-CoV-2 viral copies this assay can detect is 131 copies/mL. A negative result does not preclude SARS-Cov-2 infection and should not be used as the sole basis for treatment or other patient management decisions. A negative result may occur with  improper specimen collection/handling, submission of specimen other than nasopharyngeal swab, presence of viral mutation(s) within the areas targeted by this assay, and inadequate number of viral  copies (<131 copies/mL). A negative result must be combined with clinical observations, patient history, and epidemiological information. The expected result is Negative. Fact Sheet for Patients:  PinkCheek.be Fact Sheet for Healthcare Providers:  GravelBags.it This test is not yet ap proved or cleared by the [place] FDA and  has been authorized for detection and/or diagnosis of SARS-CoV-2 by FDA under an Emergency Use Authorization (EUA). This EUA will remain  in effect (meaning this test can be used) for the duration of the COVID-19 declaration under Section 564(b)(1) of the Act, 21 U.S.C. section 360bbb-3(b)(1), unless the authorization is terminated or revoked sooner.    Influenza A by PCR NEGATIVE NEGATIVE Final   Influenza B by PCR NEGATIVE NEGATIVE Final    Comment: (NOTE) The Xpert Xpress SARS-CoV-2/FLU/RSV assay is intended as an aid in  the diagnosis of influenza from Nasopharyngeal swab specimens and  should not be used as a sole basis for treatment. Nasal washings and  aspirates are unacceptable for Xpert Xpress SARS-CoV-2/FLU/RSV  testing. Fact Sheet for Patients: PinkCheek.be Fact Sheet for Healthcare Providers: GravelBags.it This test is not yet approved or cleared by the [place] FDA and  has been authorized for detection and/or diagnosis of SARS-CoV-2 by  FDA under an Emergency Use Authorization (EUA). This EUA will remain  in effect (meaning this test can be used) for the  duration of the  Covid-19 declaration under Section 564(b)(1) of the Act, 21  U.S.C. section 360bbb-3(b)(1), unless the authorization is  terminated or revoked. Performed at [facility], [address] [place], [place] [address]     Coagulation Studies: No results for input(s): LABPROT, INR in the last 72 hours.  Urinalysis: No results for input(s):  COLORURINE, LABSPEC, PHURINE, GLUCOSEU, HGBUR, BILIRUBINUR, KETONESUR, PROTEINUR, UROBILINOGEN, NITRITE, LEUKOCYTESUR in the last 72 hours.  Invalid input(s): APPERANCEUR    Imaging: DG Chest 1 View  Result Date: 05/31/2019 CLINICAL DATA:  74 year old female with shortness of breath. Possible aspiration. EXAM: CHEST  1 VIEW COMPARISON:  Chest radiograph dated 05/09/2019. FINDINGS: Left IJ dialysis catheter in similar position. Interval placement of a right IJ central venous line with tip over central SVC. There is shallow inspiration with bilateral pleural effusions and bibasilar atelectasis. Diffuse vascular congestion and edema. Pneumonia is not excluded. Clinical correlation is recommended. No pneumothorax. There is atherosclerotic calcification of the aorta. No acute osseous pathology. IMPRESSION: 1. Bilateral pleural effusions with bibasilar atelectasis. 2. Diffuse vascular congestion and edema. Pneumonia is not excluded. Electronically Signed   By: Anner Crete M.D.   On: 05/31/2019 21:06   CT HEAD WO CONTRAST  Result Date: 05/31/2019 CLINICAL DATA:  Altered mental status. EXAM: CT HEAD WITHOUT CONTRAST TECHNIQUE: Contiguous axial images were obtained from the base of the skull through the vertex without intravenous contrast. COMPARISON:  May 10, 2019 FINDINGS: Brain: There is mild cerebral atrophy with widening of the extra-axial spaces and ventricular dilatation. There are areas of decreased attenuation within the white matter tracts of the supratentorial brain, consistent with microvascular disease changes. A large area of cortical encephalomalacia, with adjacent chronic white matter low attenuation, is seen within the right parietal lobe. Associated ex vacuo dilatation of the right lateral ventricle is seen. These findings are present on the prior exam. Vascular: No hyperdense vessel or unexpected calcification. Skull: Normal. Negative for fracture or focal lesion. Sinuses/Orbits: There is  mild sphenoid sinus mucosal thickening. Other: None. IMPRESSION: 1. Generalized cerebral atrophy. 2. Large, chronic right parietal lobe infarct. 3. No acute intracranial abnormality. Electronically Signed   By: Virgina Norfolk M.D.   On: 05/31/2019 21:34   MR BRAIN WO CONTRAST  Result Date: 06/01/2019 CLINICAL DATA:  Initial evaluation for acute encephalopathy. EXAM: MRI HEAD WITHOUT CONTRAST TECHNIQUE: Multiplanar, multiecho pulse sequences of the brain and surrounding structures were obtained without intravenous contrast. COMPARISON:  Prior head CT from 05/31/2019. FINDINGS: Brain: Examination technically limited as the patient's vital signs peaking unstable during the course of the exam. Examination was terminated prematurely. Axial DWI sequence only was performed. Diffusion-weighted imaging demonstrates no evidence for acute or subacute ischemia. Large area of encephalomalacia of involving the right cerebral hemisphere compatible with a chronic right MCA territory infarct. No other visible areas of chronic infarction. No appreciable mass lesion, mass effect, or midline shift on this limited exam. Ex vacuo dilatation of the right lateral ventricle related to the chronic right cerebral encephalomalacia. No hydrocephalus. No extra-axial fluid collection. No obvious acute intracranial abnormality on this limited exam. Vascular: Not assessed on this limited exam. Skull and upper cervical spine: Not well assessed on this limited exam. Sinuses/Orbits: Not well assessed on this limited exam. Other: None. IMPRESSION: 1. Technically limited exam with only axial DWI sequence performed. Patient's vital signs became unstable during the course of the study, which as a result was terminated early. 2. No evidence for acute or subacute infarct.  No other definite acute intracranial abnormality on this limited study. 3. Chronic right MCA territory infarct. Electronically Signed   By: Jeannine Boga M.D.   On:  06/01/2019 02:13     Medications:   . sodium chloride Stopped (06/02/19 0806)  . ceFTAROline (TEFLARO) IV 250 mL/hr at 06/02/19 0900  . DAPTOmycin (CUBICIN)  IV Stopped (06/01/19 2206)  . dextrose 50 mL/hr at 06/02/19 0900  . feeding supplement (OSMOLITE 1.5 CAL) Stopped (05/31/19 2030)   . allopurinol  100 mg Oral QPM  . vitamin C  500 mg Per Tube BID  . B-complex with vitamin C  1 tablet Per Tube Daily  . brimonidine  1 drop Both Eyes Q12H   And  . timolol  1 drop Both Eyes Q12H  . Chlorhexidine Gluconate Cloth  6 each Topical Daily  . clopidogrel  75 mg Oral QPM  . epoetin (EPOGEN/PROCRIT) injection  4,000 Units Intravenous Q M,W,F-HD  . feeding supplement (PRO-STAT SUGAR FREE 64)  30 mL Per Tube Daily  . free water  30 mL Per Tube Q4H  . insulin aspart  0-9 Units Subcutaneous Q4H  . latanoprost  1 drop Both Eyes QPM  . levETIRAcetam  1,000 mg Oral QPM  . megestrol  400 mg Oral BID  . midodrine  5 mg Oral Q M,W,F  . pantoprazole  40 mg Oral QHS  . sertraline  50 mg Oral Daily   sodium chloride, acetaminophen (TYLENOL) oral liquid 160 mg/5 mL, ondansetron (ZOFRAN) IV  Assessment/ Plan:  [name] is a 74 y.o. black female with end stage renal disease on hemodialysis, end-stage on hemodialysis, CVA with left-sided hemiparesis, hypertension, coronary artery disease, GERD, diabetes mellitus type 2, gout, seizure disorder who was admitted on 05/30/2019 with bacteremia, septic shock. Found to have MRSA in blood cultures. Blood cultures from 05/16/2019 were negative. Currently on ceftaroline and daptomycin.   [facility] MWF [facility] 63.5kg CVC  1. End Stage Renal Disease: hemodialysis today. Keep MWF schedule.   2. Hypotension with MRSA bacteremia/sepsis Holding home blood pressure agents.  - ceftaroline and daptomycin.   3. Anemia of chronic disease: hemoglobin 7.8 - EPO with HD treatment.   4. Secondary Hyperparathyroidism: Calcium and phosphorus at goal. No indication  for binders.    LOS: 23 Dorina Ribaudo 4/26/20219:13 AM

## 2019-06-02 NOTE — Consult Note (Signed)
PHARMACY CONSULT NOTE - FOLLOW UP  Pharmacy Consult for Electrolyte Monitoring and Replacement   Recent Labs: Potassium (mmol/L)  Date Value  06/02/2019 3.0 (L)  06/02/2019 3.0 (L)   Magnesium (mg/dL)  Date Value  06/02/2019 1.6 (L)   Calcium (mg/dL)  Date Value  06/02/2019 8.1 (L)   Albumin (g/dL)  Date Value  06/02/2019 1.5 (L)   Phosphorus (mg/dL)  Date Value  06/02/2019 3.2  06/02/2019 3.1   Sodium (mmol/L)  Date Value  06/02/2019 138     Assessment: 74 y.o.femalewho is bed bound and from SNF with history ofend-stage renal disease recently started on hemodialysis, history of right MCA stroke, history of dysphagia due to Schatzki's ring and is status post esophageal dilatation who was admitted with altered mental status. Risk for refeeding.   Goal of Therapy:  WNL  Plan:  K 3.0  Mag 1.6  Phos 3.2   Hemodialysis MWF Will order KCL 40 meq per tube x 1 and Mag Sulfate 2g IV x 1.-conservative since Hemodialysis pt.  Follow up with electrolytes with AM labs.   Paulina Fusi, PharmD, BCPS 06/02/2019 1:36 PM

## 2019-06-02 NOTE — Progress Notes (Signed)
Patient ID: Crystal Vargas, female   DOB: 1945/05/05, 74 y.o.   MRN: [id number]  This NP reviewed medical records received report from team assessed the patient and then revisited patient at the bedside as a follow up for palliative medicine needs and emotional support at the request of Dr. Posey Pronto.  Today is day 27 of this hospitalization and unfortunately the patient continues to decline in spite of best efforts to treat the treatable.  Patient is currently on BiPAP and minimally responsive to verbal stimuli.  She has multiple comorbidities to include MRSA bacteremia secondary to infected hemodialysis catheters.  Acute metabolic encephalopathy, acute hypoxic hypercapnic respiratory failure, heart failure, dysphagia, end-stage renal disease, status post CVA and basically adult failure to thrive in spite of aggressive medical interventions.  Patient was initially seen by the palliative medicine team on 05/12/2019.  No family at bedside,  I spoke to daughter Crystal Vargas by telephone, reintroduced the role of palliative medicine, reviewed current medical situation and likely long-term poor prognosis, discussed limitations of medical interventions to prolong quality of life when the body fails to thrive and attempted to elicit values and goals of care important to the patient and family.  Education offered in hopes of helping family navigate the difficult choices facing them.  Family members acknowledge seriousness of the medical situation and request to speak to the attending/Dr. Posey Pronto and specifically Dr. Juleen China with nephrology.   I relayed this information to both.  Dr. Juleen China to speak with family today.  Discussed with family the importance of continued conversation with each other and the  medical providers regarding overall plan of care and treatment options,  ensuring decisions are within the context of the patients values and GOCs.  Questions and concerns addressed   Discussed with Dr Posey Pronto and Dr  Juleen China  Total time spent on the unit was 20 minutes    PMT will continue to support holistically  Greater than 50% of the time was spent in counseling and coordination of care  Wadie Lessen NP  Palliative Medicine Team Team Phone # [phone] Pager [phone]

## 2019-06-03 ENCOUNTER — Other Ambulatory Visit (INDEPENDENT_AMBULATORY_CARE_PROVIDER_SITE_OTHER): Payer: Self-pay | Admitting: Vascular Surgery

## 2019-06-03 ENCOUNTER — Encounter: Admission: EM | Disposition: E | Payer: Self-pay | Source: Home / Self Care | Attending: Internal Medicine

## 2019-06-03 ENCOUNTER — Inpatient Hospital Stay: Payer: Medicare Other

## 2019-06-03 DIAGNOSIS — R579 Shock, unspecified: Secondary | ICD-10-CM | POA: Diagnosis not present

## 2019-06-03 DIAGNOSIS — B9562 Methicillin resistant Staphylococcus aureus infection as the cause of diseases classified elsewhere: Secondary | ICD-10-CM | POA: Diagnosis not present

## 2019-06-03 DIAGNOSIS — G9341 Metabolic encephalopathy: Secondary | ICD-10-CM

## 2019-06-03 DIAGNOSIS — R627 Adult failure to thrive: Secondary | ICD-10-CM

## 2019-06-03 DIAGNOSIS — I959 Hypotension, unspecified: Secondary | ICD-10-CM

## 2019-06-03 DIAGNOSIS — N186 End stage renal disease: Secondary | ICD-10-CM | POA: Diagnosis not present

## 2019-06-03 DIAGNOSIS — J9601 Acute respiratory failure with hypoxia: Secondary | ICD-10-CM | POA: Diagnosis not present

## 2019-06-03 DIAGNOSIS — R7881 Bacteremia: Secondary | ICD-10-CM | POA: Diagnosis not present

## 2019-06-03 LAB — CBC WITH DIFFERENTIAL/PLATELET
Abs Immature Granulocytes: 0.26 10*3/uL — ABNORMAL HIGH (ref 0.00–0.07)
Basophils Absolute: 0 10*3/uL (ref 0.0–0.1)
Basophils Relative: 0 %
Eosinophils Absolute: 0.3 10*3/uL (ref 0.0–0.5)
Eosinophils Relative: 2 %
HCT: 23.1 % — ABNORMAL LOW (ref 36.0–46.0)
Hemoglobin: 7.3 g/dL — ABNORMAL LOW (ref 12.0–15.0)
Immature Granulocytes: 2 %
Lymphocytes Relative: 8 %
Lymphs Abs: 1.1 10*3/uL (ref 0.7–4.0)
MCH: 29.9 pg (ref 26.0–34.0)
MCHC: 31.6 g/dL (ref 30.0–36.0)
MCV: 94.7 fL (ref 80.0–100.0)
Monocytes Absolute: 1 10*3/uL (ref 0.1–1.0)
Monocytes Relative: 6 %
Neutro Abs: 12.3 10*3/uL — ABNORMAL HIGH (ref 1.7–7.7)
Neutrophils Relative %: 82 %
Platelets: 141 10*3/uL — ABNORMAL LOW (ref 150–400)
RBC: 2.44 MIL/uL — ABNORMAL LOW (ref 3.87–5.11)
RDW: 19.5 % — ABNORMAL HIGH (ref 11.5–15.5)
WBC: 14.9 10*3/uL — ABNORMAL HIGH (ref 4.0–10.5)
nRBC: 0.1 % (ref 0.0–0.2)

## 2019-06-03 LAB — BASIC METABOLIC PANEL
Anion gap: 8 (ref 5–15)
BUN: 26 mg/dL — ABNORMAL HIGH (ref 8–23)
CO2: 21 mmol/L — ABNORMAL LOW (ref 22–32)
Calcium: 8.3 mg/dL — ABNORMAL LOW (ref 8.9–10.3)
Chloride: 107 mmol/L (ref 98–111)
Creatinine, Ser: 2.68 mg/dL — ABNORMAL HIGH (ref 0.44–1.00)
GFR calc Af Amer: 19 mL/min — ABNORMAL LOW (ref >60)
GFR calc non Af Amer: 17 mL/min — ABNORMAL LOW (ref >60)
Glucose, Bld: 181 mg/dL — ABNORMAL HIGH (ref 70–99)
Potassium: 3.3 mmol/L — ABNORMAL LOW (ref 3.5–5.1)
Sodium: 136 mmol/L (ref 135–145)

## 2019-06-03 LAB — BLOOD GAS, ARTERIAL
Acid-base deficit: 8.8 mmol/L — ABNORMAL HIGH (ref 0.0–2.0)
Bicarbonate: 17.4 mmol/L — ABNORMAL LOW (ref 20.0–28.0)
Expiratory PAP: 6
FIO2: 0.28
Inspiratory PAP: 14
Mode: POSITIVE
O2 Saturation: 90.3 %
Patient temperature: 37
pCO2 arterial: 38 mmHg (ref 32.0–48.0)
pH, Arterial: 7.27 — ABNORMAL LOW (ref 7.350–7.450)
pO2, Arterial: 68 mmHg — ABNORMAL LOW (ref 83.0–108.0)

## 2019-06-03 LAB — GLUCOSE, CAPILLARY
Glucose-Capillary: 148 mg/dL — ABNORMAL HIGH (ref 70–99)
Glucose-Capillary: 174 mg/dL — ABNORMAL HIGH (ref 70–99)
Glucose-Capillary: 207 mg/dL — ABNORMAL HIGH (ref 70–99)

## 2019-06-03 LAB — CK: Total CK: 253 U/L — ABNORMAL HIGH (ref 38–234)

## 2019-06-03 LAB — PROCALCITONIN: Procalcitonin: 0.68 ng/mL

## 2019-06-03 SURGERY — DIALYSIS/PERMA CATHETER INSERTION
Anesthesia: Choice | Laterality: Right

## 2019-06-03 MED ORDER — NOREPINEPHRINE 4 MG/250ML-% IV SOLN: 0.0000 ug/min | INTRAVENOUS | Status: DC

## 2019-06-03 MED ORDER — HEPARIN SODIUM (PORCINE) 5000 UNIT/ML IJ SOLN: 5000.0000 [IU] | Freq: Three times a day (TID) | INTRAMUSCULAR | Status: DC

## 2019-06-03 MED ORDER — ACETAMINOPHEN 325 MG PO TABS: 650.0000 mg | ORAL_TABLET | Freq: Four times a day (QID) | ORAL | Status: DC | PRN

## 2019-06-03 MED ORDER — PHENYLEPHRINE HCL-NACL 10-0.9 MG/250ML-% IV SOLN
25.0000 ug/min | INTRAVENOUS | Status: DC
  Filled 2019-06-03: qty 250

## 2019-06-03 MED ORDER — POLYVINYL ALCOHOL 1.4 % OP SOLN
1.0000 [drp] | Freq: Four times a day (QID) | OPHTHALMIC | Status: DC | PRN
  Filled 2019-06-03: qty 15

## 2019-06-03 MED ORDER — PANTOPRAZOLE SODIUM 40 MG IV SOLR: 40.0000 mg | Freq: Every day | INTRAVENOUS | Status: DC

## 2019-06-03 MED ORDER — MIDAZOLAM HCL 2 MG/2ML IJ SOLN
2.0000 mg | INTRAMUSCULAR | Status: DC | PRN
  Administered 2019-06-03: 2 mg via INTRAVENOUS
  Filled 2019-06-03: qty 4

## 2019-06-03 MED ORDER — GLYCOPYRROLATE 1 MG PO TABS
1.0000 mg | ORAL_TABLET | ORAL | Status: DC | PRN
  Filled 2019-06-03: qty 1

## 2019-06-03 MED ORDER — GLYCOPYRROLATE 0.2 MG/ML IJ SOLN
0.2000 mg | INTRAMUSCULAR | Status: DC | PRN
  Administered 2019-06-03: 0.2 mg via INTRAVENOUS
  Filled 2019-06-03: qty 1

## 2019-06-03 MED ORDER — DEXTROSE 5 % IV SOLN: INTRAVENOUS | Status: DC

## 2019-06-03 MED ORDER — MORPHINE SULFATE (PF) 2 MG/ML IV SOLN
2.0000 mg | INTRAVENOUS | Status: DC | PRN
  Administered 2019-06-03: 18:00:00 4 mg via INTRAVENOUS
  Administered 2019-06-03: 21:00:00 2 mg via INTRAVENOUS
  Filled 2019-06-03 (×3): qty 1

## 2019-06-03 MED ORDER — SODIUM CHLORIDE 0.9 % IV SOLN
25.0000 ug/min | INTRAVENOUS | Status: DC
  Administered 2019-06-03: 13:00:00 25 ug/min via INTRAVENOUS
  Filled 2019-06-03: qty 10

## 2019-06-03 MED ORDER — PHENYLEPHRINE CONCENTRATED 100MG/250ML (0.4 MG/ML) INFUSION SIMPLE
25.0000 ug/min | INTRAVENOUS | Status: DC
  Filled 2019-06-03 (×2): qty 250

## 2019-06-03 MED ORDER — SODIUM CHLORIDE 0.9 % IV SOLN: 250.0000 mL | INTRAVENOUS | Status: DC

## 2019-06-03 MED ORDER — OSMOLITE 1.5 CAL PO LIQD: 1000.0000 mL | ORAL | Status: DC

## 2019-06-03 MED ORDER — LEVETIRACETAM IN NACL 1000 MG/100ML IV SOLN
1000.0000 mg | Freq: Every day | INTRAVENOUS | Status: DC
  Administered 2019-06-03: 1000 mg via INTRAVENOUS
  Filled 2019-06-03: qty 100

## 2019-06-03 MED ORDER — GLYCOPYRROLATE 0.2 MG/ML IJ SOLN: 0.2000 mg | INTRAMUSCULAR | Status: DC | PRN

## 2019-06-03 MED ORDER — MIDODRINE HCL 5 MG PO TABS: 5.0000 mg | ORAL_TABLET | ORAL | Status: DC

## 2019-06-03 MED ORDER — LEVETIRACETAM 100 MG/ML PO SOLN
1000.0000 mg | Freq: Every day | ORAL | Status: DC
  Filled 2019-06-03: qty 10

## 2019-06-03 MED ORDER — ACETAMINOPHEN 650 MG RE SUPP: 650.0000 mg | Freq: Four times a day (QID) | RECTAL | Status: DC | PRN

## 2019-06-03 MED ORDER — CLOPIDOGREL BISULFATE 75 MG PO TABS: 75.0000 mg | ORAL_TABLET | Freq: Every evening | ORAL | Status: DC

## 2019-06-03 MED ORDER — SERTRALINE HCL 50 MG PO TABS: 50.0000 mg | ORAL_TABLET | Freq: Every day | ORAL | Status: DC

## 2019-06-03 MED ORDER — DIPHENHYDRAMINE HCL 50 MG/ML IJ SOLN: 25.0000 mg | INTRAMUSCULAR | Status: DC | PRN

## 2019-06-03 MED ORDER — NOREPINEPHRINE 16 MG/250ML-% IV SOLN
0.0000 ug/min | INTRAVENOUS | Status: DC
  Filled 2019-06-03: qty 250

## 2019-06-04 LAB — TYPE AND SCREEN
ABO/RH(D): O POS
Antibody Screen: POSITIVE
Unit division: 0
Unit division: 0

## 2019-06-04 LAB — BPAM RBC
Blood Product Expiration Date: 202105302359
Blood Product Expiration Date: 202105302359
Unit Type and Rh: 5100
Unit Type and Rh: 5100

## 2019-06-07 NOTE — Death Summary Note (Signed)
DEATH SUMMARY   Patient Details  Name: Crystal Vargas MRN: [id number] DOB: May 06, 1945  Admission/Discharge Information   Admit Date:  06/01/2019  Date of Death:  2019/06/13  Time of Death:  05-05-2204  Length of Stay: 05/10/2022  Referring Physician: Marisa Hua, MD   Reason(s) for Hospitalization  MRSA INFECTION/SEPTIC SHOCK  Diagnoses  Preliminary cause of death:  acute cardiac failure, ischemic cardiomyopathy, DM Secondary Diagnoses (including complications and co-morbidities):  Principal Problem:   MRSA bacteremia Active Problems:   Diabetes ([place])   Essential hypertension   ESRD on dialysis ([place])   Shock ([place])   Status post CVA   Schatzki's ring   Acute respiratory failure ([place])   Hypokalemia   ESRD (end stage renal disease) ([place])   Pressure injury of skin   Problems with swallowing and mastication   Acute metabolic encephalopathy   Anemia   Adult failure to thrive   Brief Hospital Course (including significant findings, care, treatment, and services provided and events leading to death)   74 y.o. Female initially admitted 05/26/2019 with MRSA bacteremia & septic shock due to infected Hemodialysis catheter.  Catheter removed, and ID following.  Receiving Daptomycin & Ceftaroline.  On 4/24 developed Acute Metabolic Encephalopathy & Acute Hypoxic Hypercapnic Respiratory Failure secondary to Acute Decompensated HFrEF and possible pneumonia requiring BiPAP.   Lines / Drains: Right and left chest port   Cultures / Sepsis markers: Staph aureus + blood culture   Antibiotics: Teflaro and dapto per ID     Protocols / Consultants: PCCM, hospitalist, pharmacy, nephrology     SIGNIFICANT EVENTS  20-May-2022: Admission to ICU 4/4: Infected dialysis catheter removed MRSA bacteremia, persistant 4/12: New Left IJ perm cath placed by Vascular Surgery 4/14: EGD revealed esophageal stenosis, dilation performed 4/20: TEE with no vegetations 4/21: Placement of tunneled single lumen [place]  catheter for long term abx 4/22: PEG tube placed 4/24: AMS and Acute hypoxic hypercapnic respiratory failure requiring transfer to ICU for BiPAP; PCCM consulted. 4/25: Did not tolerate MRI, became bradycardic (HR 30's) and hypoxic when laid flat on table 4/25: More awake and alert s/p BiPAP 4/25 - Patient is critically ill with overall poor prognosis due to advanced age with multiple severe chronic medical conditions with prolonged hospitalization and sepsis bacteremia.  4/26-4/27 DID NOT Tolerate HD, patient on bIPAP, obtunded Very poor prognosis   Severe ACUTE Hypoxic and Hypercapnic Respiratory Failure On biPAP, secondary to acute pulm edema, aspiration pneumonitis with b/l effusions High risk for intubation   ACUTE SYSTOLIC CARDIAC FAILURE Poor prognosis   ESRD  KIDNEY INJURY/Renal Failure Did NOT tolerate HD due to clottingof cath Poor prognosis     SEVERE MALNUTRITION -PEG status -post CVA -bitemporal wasting with muscular atrophy   INFECTIOUS DISEASE -continue antibiotics as prescribed -follow up cultures -follow up ID consultation   ENDO - ICU hypoglycemic\Hyperglycemia protocol -check FSBS per protocol   ELECTROLYTES -follow labs as needed -replace as needed -pharmacy consultation and following      PATIENT SUFFERING, SLOW DYING PROCESS VERY POOR PROGNOSIS AND NO CHANCE OF MEANINGFUL RECOVERY  Family At bedside, clinical status relayed to family (both daughters)  Updated and notified of patients medical condition-  Progressive multiorgan failure with very low chance of meaningful recovery.  Patient is in dying  Process.  Family understands the situation.  They have consented and agreed to DNR/DNI and would like to proceed with Comfort care measures.   Family are satisfied with Plan of action and management. All questions answered  Pertinent Labs and Studies  Significant Diagnostic Studies DG Chest 1 View  Result Date: 05/31/2019 CLINICAL  DATA:  74 year old female with shortness of breath. Possible aspiration. EXAM: CHEST  1 VIEW COMPARISON:  Chest radiograph dated 05/30/2019. FINDINGS: Left IJ dialysis catheter in similar position. Interval placement of a right IJ central venous line with tip over central SVC. There is shallow inspiration with bilateral pleural effusions and bibasilar atelectasis. Diffuse vascular congestion and edema. Pneumonia is not excluded. Clinical correlation is recommended. No pneumothorax. There is atherosclerotic calcification of the aorta. No acute osseous pathology. IMPRESSION: 1. Bilateral pleural effusions with bibasilar atelectasis. 2. Diffuse vascular congestion and edema. Pneumonia is not excluded. Electronically Signed   By: Anner Crete M.D.   On: 05/31/2019 21:06   CT HEAD WO CONTRAST  Result Date: 05/31/2019 CLINICAL DATA:  Altered mental status. EXAM: CT HEAD WITHOUT CONTRAST TECHNIQUE: Contiguous axial images were obtained from the base of the skull through the vertex without intravenous contrast. COMPARISON:  May 10, 2019 FINDINGS: Brain: There is mild cerebral atrophy with widening of the extra-axial spaces and ventricular dilatation. There are areas of decreased attenuation within the white matter tracts of the supratentorial brain, consistent with microvascular disease changes. A large area of cortical encephalomalacia, with adjacent chronic white matter low attenuation, is seen within the right parietal lobe. Associated ex vacuo dilatation of the right lateral ventricle is seen. These findings are present on the prior exam. Vascular: No hyperdense vessel or unexpected calcification. Skull: Normal. Negative for fracture or focal lesion. Sinuses/Orbits: There is mild sphenoid sinus mucosal thickening. Other: None. IMPRESSION: 1. Generalized cerebral atrophy. 2. Large, chronic right parietal lobe infarct. 3. No acute intracranial abnormality. Electronically Signed   By: Virgina Norfolk M.D.   On:  05/31/2019 21:34   CT Head Wo Contrast  Result Date: 05/08/2019 CLINICAL DATA:  Headache, head trauma. Altered mental status EXAM: CT HEAD WITHOUT CONTRAST TECHNIQUE: Contiguous axial images were obtained from the base of the skull through the vertex without intravenous contrast. COMPARISON:  None. FINDINGS: Brain: No acute hemorrhage. No subdural or extra-axial collection. Large chronic right MCA distribution encephalomalacia. Associated ex vacuo dilatation of the right lateral ventricle. Background periventricular chronic small vessel ischemia. No evidence of acute infarct. Vascular: Atherosclerosis of skullbase vasculature without hyperdense vessel or abnormal calcification. Skull: No fracture or focal lesion. Sinuses/Orbits: Paranasal sinuses and mastoid air cells are clear. The visualized orbits are unremarkable. Other: None. IMPRESSION: 1. No acute intracranial abnormality. 2. Large remote right MCA distribution infarct with ex vacuo dilatation of the right lateral ventricle. Background chronic small vessel ischemia. Electronically Signed   By: Keith Rake M.D.   On: 05/17/2019 15:09   CT Cervical Spine Wo Contrast  Result Date: 05/08/2019 CLINICAL DATA:  Polytrauma, critical, head/C-spine injury suspected EXAM: CT CERVICAL SPINE WITHOUT CONTRAST TECHNIQUE: Multidetector CT imaging of the cervical spine was performed without intravenous contrast. Multiplanar CT image reconstructions were also generated. COMPARISON:  None. FINDINGS: Alignment: No evidence of traumatic subluxation. Trace anterolisthesis of C6 on C7 is likely degenerative and facet mediated. Skull base and vertebrae: No acute fracture. Vertebral body heights are maintained. The dens and skull base are intact. Soft tissues and spinal canal: No prevertebral fluid or swelling. No visible canal hematoma. Disc levels: Multilevel endplate spurring with mild disc space narrowing at C5-C6 and C6-C7. Multilevel facet hypertrophy. Upper chest:  Right internal jugular dialysis catheter in place. Patulous esophagus containing intraluminal fluid/debris. Minimal patchy opacity in the perifissural left  upper lobe, partially included. Other: Carotid calcifications. IMPRESSION: 1. Degenerative change in the cervical spine without acute fracture or subluxation. 2. Patulous esophagus containing intraluminal fluid/debris. Electronically Signed   By: Keith Rake M.D.   On: 06/02/2019 15:13   MR BRAIN WO CONTRAST  Result Date: 06/01/2019 CLINICAL DATA:  Initial evaluation for acute encephalopathy. EXAM: MRI HEAD WITHOUT CONTRAST TECHNIQUE: Multiplanar, multiecho pulse sequences of the brain and surrounding structures were obtained without intravenous contrast. COMPARISON:  Prior head CT from 05/31/2019. FINDINGS: Brain: Examination technically limited as the patient's vital signs peaking unstable during the course of the exam. Examination was terminated prematurely. Axial DWI sequence only was performed. Diffusion-weighted imaging demonstrates no evidence for acute or subacute ischemia. Large area of encephalomalacia of involving the right cerebral hemisphere compatible with a chronic right MCA territory infarct. No other visible areas of chronic infarction. No appreciable mass lesion, mass effect, or midline shift on this limited exam. Ex vacuo dilatation of the right lateral ventricle related to the chronic right cerebral encephalomalacia. No hydrocephalus. No extra-axial fluid collection. No obvious acute intracranial abnormality on this limited exam. Vascular: Not assessed on this limited exam. Skull and upper cervical spine: Not well assessed on this limited exam. Sinuses/Orbits: Not well assessed on this limited exam. Other: None. IMPRESSION: 1. Technically limited exam with only axial DWI sequence performed. Patient's vital signs became unstable during the course of the study, which as a result was terminated early. 2. No evidence for acute or subacute  infarct. No other definite acute intracranial abnormality on this limited study. 3. Chronic right MCA territory infarct. Electronically Signed   By: Jeannine Boga M.D.   On: 06/01/2019 02:13   PERIPHERAL VASCULAR CATHETERIZATION  Result Date: 05/14/2019 See Op Note  PERIPHERAL VASCULAR CATHETERIZATION  Result Date: 05/23/2019 See op note  PERIPHERAL VASCULAR CATHETERIZATION  Result Date: 05/31/2019 See op note  DG Chest Port 1 View  Result Date: 05/08/2019 CLINICAL DATA:  Hypoxia EXAM: PORTABLE CHEST 1 VIEW COMPARISON:  Film from the previous day FINDINGS: Cardiac shadow is enlarged. Aortic calcifications are again seen. Changes of vascular congestion with parenchymal edema are noted increased when compared with the prior study. Dialysis catheter is noted on the left stable in appearance. No sizable effusion is noted at this time. No bony abnormality is seen. IMPRESSION: Increasing vascular congestion and pulmonary edema. Electronically Signed   By: Inez Catalina M.D.   On: 05/13/2019 19:18   DG Chest Port 1 View  Result Date: 05/09/2019 CLINICAL DATA:  Tachypnea.  Renal failure EXAM: PORTABLE CHEST 1 VIEW COMPARISON:  May 11, 2019. FINDINGS: Central catheter tip is in the right atrium. No pneumothorax. There is cardiomegaly with pulmonary venous hypertension. There are small pleural effusions bilaterally. There is interstitial thickening throughout the lungs as well as patchy areas of airspace opacity, primarily in the perihilar regions. There is aortic atherosclerosis.  No evident bone lesions. IMPRESSION: Central catheter tip in right atrium. Cardiomegaly with pulmonary vascular congestion and small pleural effusions. Findings most likely due to interstitial and alveolar edema. Overall appearance consistent with congestive heart failure. Areas of superimposed pneumonia cannot be excluded radiographically. Aortic Atherosclerosis (ICD10-I70.0). Electronically Signed   By: Lowella Grip  III M.D.   On: 05/31/2019 09:05   DG Chest Port 1 View  Result Date: 05/11/2019 CLINICAL DATA:  Respiratory failure EXAM: PORTABLE CHEST 1 VIEW COMPARISON:  May 10, 2019 FINDINGS: Central catheter tip in right atrium slightly beyond cavoatrial junction. No pneumothorax. There is  atelectatic change in the medial right base. Lungs elsewhere clear. There is stable cardiac prominence with pulmonary vascularity normal. There is aortic atherosclerosis. No adenopathy. No bone lesions. IMPRESSION: No change in central catheter placement. No pneumothorax. Medial right base atelectasis. Lungs elsewhere clear. Stable cardiac prominence. Aortic Atherosclerosis (ICD10-I70.0). Electronically Signed   By: Lowella Grip III M.D.   On: 05/11/2019 08:36   DG Chest Portable 1 View  Result Date: 05/30/2019 CLINICAL DATA:  Shortness of breath EXAM: PORTABLE CHEST 1 VIEW COMPARISON:  None. FINDINGS: The heart size and mediastinal contours are within normal limits. Large-bore right neck multi lumen vascular catheter. Both lungs are clear. The visualized skeletal structures are unremarkable. IMPRESSION: No acute abnormality of the lungs in AP portable projection. Electronically Signed   By: Eddie Candle M.D.   On: 05/24/2019 15:13   ECHOCARDIOGRAM COMPLETE  Result Date: 05/12/2019    ECHOCARDIOGRAM REPORT   Patient Name:   ANETTA Souter Date of Exam: 05/11/2019 Medical Rec #:  [id number]  Height:       62.0 in Accession #:    [id number] Weight:       136.2 lb Date of Birth:  02/09/1945  BSA:          1.624 m Patient Age:    13 years   BP:           100/63 mmHg Patient Gender: F          HR:           64 bpm. Exam Location:  [facility] Procedure: 2D Echo Indications:     Bacteremia 790.7/ R78.81  History:         Patient has no prior history of Echocardiogram examinations.  Sonographer:     Grant Referring Phys:  [id number] Bradly Bienenstock Diagnosing Phys: Bartholome Bill MD IMPRESSIONS  1. Left ventricular ejection fraction, by  estimation, is 45 to 50%. The left ventricle has mildly decreased function. The left ventricle has no regional wall motion abnormalities. There is moderate concentric left ventricular hypertrophy. Left ventricular diastolic parameters are consistent with Grade I diastolic dysfunction (impaired relaxation).  2. Right ventricular systolic function is normal. The right ventricular size is mildly enlarged. There is moderately elevated pulmonary artery systolic pressure.  3. Left atrial size was mildly dilated.  4. Right atrial size was mildly dilated.  5. The mitral valve was not well visualized. Mild to moderate mitral valve regurgitation.  6. Tricuspid valve regurgitation is moderate to severe.  7. The aortic valve is grossly normal. Aortic valve regurgitation is trivial. No aortic stenosis is present. FINDINGS  Left Ventricle: Left ventricular ejection fraction, by estimation, is 45 to 50%. The left ventricle has mildly decreased function. The left ventricle has no regional wall motion abnormalities. The left ventricular internal cavity size was normal in size. There is moderate concentric left ventricular hypertrophy. Left ventricular diastolic parameters are consistent with Grade I diastolic dysfunction (impaired relaxation). Right Ventricle: The right ventricular size is mildly enlarged. No increase in right ventricular wall thickness. Right ventricular systolic function is normal. There is moderately elevated pulmonary artery systolic pressure. Left Atrium: Left atrial size was mildly dilated. Right Atrium: Right atrial size was mildly dilated. Pericardium: There is no evidence of pericardial effusion. Mitral Valve: The mitral valve was not well visualized. Mild to moderate mitral valve regurgitation. Tricuspid Valve: The tricuspid valve is not well visualized. Tricuspid valve regurgitation is moderate to severe. Aortic Valve: The aortic valve is grossly  normal. Aortic valve regurgitation is trivial. No aortic  stenosis is present. Aortic valve peak gradient measures 3.5 mmHg. Pulmonic Valve: The pulmonic valve was not well visualized. Pulmonic valve regurgitation is trivial. Aorta: The aortic root was not well visualized. IAS/Shunts: The interatrial septum was not assessed.  LEFT VENTRICLE PLAX 2D LVIDd:         3.67 cm     Diastology LVIDs:         2.86 cm     LV e' lateral:   6.74 cm/s LV PW:         1.27 cm     LV E/e' lateral: 14.4 LV IVS:        1.31 cm     LV e' medial:    5.44 cm/s LVOT diam:     1.90 cm     LV E/e' medial:  17.8 LV SV:         37 LV SV Index:   23 LVOT Area:     2.84 cm  LV Volumes (MOD) LV vol d, MOD A2C: 63.1 ml LV vol d, MOD A4C: 49.4 ml LV vol s, MOD A2C: 28.0 ml LV vol s, MOD A4C: 26.7 ml LV SV MOD A2C:     35.1 ml LV SV MOD A4C:     49.4 ml LV SV MOD BP:      28.7 ml RIGHT VENTRICLE RV Basal diam:  2.77 cm RV S prime:     7.72 cm/s TAPSE (M-mode): 1.2 cm LEFT ATRIUM             Index       RIGHT ATRIUM           Index LA diam:        3.60 cm 2.22 cm/m  RA Area:     15.60 cm LA Vol (A2C):   49.1 ml 30.24 ml/m RA Volume:   38.80 ml  23.89 ml/m LA Vol (A4C):   57.1 ml 35.16 ml/m LA Biplane Vol: 58.2 ml 35.84 ml/m  AORTIC VALVE                PULMONIC VALVE AV Area (Vmax): 1.93 cm    PV Vmax:       0.62 m/s AV Vmax:        93.50 cm/s  PV Peak grad:  1.6 mmHg AV Peak Grad:   3.5 mmHg LVOT Vmax:      63.50 cm/s LVOT Vmean:     38.700 cm/s LVOT VTI:       0.132 m  AORTA Ao Root diam: 2.50 cm MITRAL VALVE               TRICUSPID VALVE MV Area (PHT): 2.32 cm    TV Peak grad:   36.8 mmHg MV Decel Time: 327 msec    TV Vmax:        3.03 m/s MV E velocity: 97.00 cm/s MV A velocity: 44.60 cm/s  SHUNTS MV E/A ratio:  2.17        Systemic VTI:  0.13 m                            Systemic Diam: 1.90 cm Bartholome Bill MD Electronically signed by Bartholome Bill MD Signature Date/Time: 05/12/2019/7:18:18 AM    Final    ECHO TEE  Result Date: 06/02/2019    TRANSESOPHOGEAL ECHO REPORT   Patient Name:    [facility] [place]  Date of Exam: 05/08/2019 Medical Rec #:  [id number]  Height:       62.0 in Accession #:    [id number] Weight:       155.2 lb Date of Birth:  Apr 05, 1945  BSA:          1.716 m Patient Age:    75 years   BP:           176/66 mmHg Patient Gender: F          HR:           72 bpm. Exam Location:  [facility] Procedure: Transesophageal Echo, Cardiac Doppler and Color Doppler Indications:     Bacteremia 790.7  History:         Patient has prior history of Echocardiogram examinations, most                  recent 05/11/2019. CAD, Stroke; Risk Factors:Hypertension and                  Diabetes.  Sonographer:     Sherrie Sport RDCS (AE) Referring Phys:  [id number] Teodoro Spray Diagnosing Phys: Bartholome Bill MD PROCEDURE: The transesophogeal probe was passed without difficulty through the esophogus of the patient. Imaged were obtained with the patient in a left lateral decubitus position. Sedation performed by different physician. The patient was monitored while under deep sedation. Image quality was excellent. The patient developed no complications during the procedure. IMPRESSIONS  1. Left ventricular ejection fraction, by estimation, is 60 to 65%. The left ventricle has normal function. The left ventricle has no regional wall motion abnormalities.  2. Right ventricular systolic function is normal. The right ventricular size is normal.  3. No left atrial/left atrial appendage thrombus was detected.  4. The mitral valve is grossly normal. Trivial mitral valve regurgitation.  5. The aortic valve is tricuspid. Aortic valve regurgitation is trivial. FINDINGS  Left Ventricle: Left ventricular ejection fraction, by estimation, is 60 to 65%. The left ventricle has normal function. The left ventricle has no regional wall motion abnormalities. The left ventricular internal cavity size was normal in size. There is  no left ventricular hypertrophy. Right Ventricle: The right ventricular size is normal. No increase in right ventricular wall  thickness. Right ventricular systolic function is normal. Left Atrium: Left atrial size was normal in size. No left atrial/left atrial appendage thrombus was detected. Right Atrium: Right atrial size was normal in size. Pericardium: There is no evidence of pericardial effusion. Mitral Valve: The mitral valve is grossly normal. Trivial mitral valve regurgitation. There is no evidence of mitral valve vegetation. Tricuspid Valve: The tricuspid valve is grossly normal. Tricuspid valve regurgitation is trivial. There is no evidence of tricuspid valve vegetation. Aortic Valve: The aortic valve is tricuspid. Aortic valve regurgitation is trivial. There is no evidence of aortic valve vegetation. Pulmonic Valve: The pulmonic valve was not well visualized. Pulmonic valve regurgitation is trivial. Aorta: The aortic root is normal in size and structure. IAS/Shunts: The interatrial septum was not assessed. Bartholome Bill MD Electronically signed by Bartholome Bill MD Signature Date/Time: 05/30/2019/12:45:42 PM    Final     Microbiology Recent Results (from the past 240 hour(s))  Respiratory Panel by RT PCR (Flu A&B, Covid) - Nasopharyngeal Swab     Status: None   Collection Time: 05/11/2019 12:38 PM   Specimen: Nasopharyngeal Swab  Result Value Ref Range Status   SARS Coronavirus 2 by RT PCR NEGATIVE NEGATIVE Final  Comment: (NOTE) SARS-CoV-2 target nucleic acids are NOT DETECTED. The SARS-CoV-2 RNA is generally detectable in upper respiratoy specimens during the acute phase of infection. The lowest concentration of SARS-CoV-2 viral copies this assay can detect is 131 copies/mL. A negative result does not preclude SARS-Cov-2 infection and should not be used as the sole basis for treatment or other patient management decisions. A negative result may occur with  improper specimen collection/handling, submission of specimen other than nasopharyngeal swab, presence of viral mutation(s) within the areas targeted by this  assay, and inadequate number of viral copies (<131 copies/mL). A negative result must be combined with clinical observations, patient history, and epidemiological information. The expected result is Negative. Fact Sheet for Patients:  PinkCheek.be Fact Sheet for Healthcare Providers:  GravelBags.it This test is not yet ap proved or cleared by the [place] FDA and  has been authorized for detection and/or diagnosis of SARS-CoV-2 by FDA under an Emergency Use Authorization (EUA). This EUA will remain  in effect (meaning this test can be used) for the duration of the COVID-19 declaration under Section 564(b)(1) of the Act, 21 U.S.C. section 360bbb-3(b)(1), unless the authorization is terminated or revoked sooner.    Influenza A by PCR NEGATIVE NEGATIVE Final   Influenza B by PCR NEGATIVE NEGATIVE Final    Comment: (NOTE) The Xpert Xpress SARS-CoV-2/FLU/RSV assay is intended as an aid in  the diagnosis of influenza from Nasopharyngeal swab specimens and  should not be used as a sole basis for treatment. Nasal washings and  aspirates are unacceptable for Xpert Xpress SARS-CoV-2/FLU/RSV  testing. Fact Sheet for Patients: PinkCheek.be Fact Sheet for Healthcare Providers: GravelBags.it This test is not yet approved or cleared by the [place] FDA and  has been authorized for detection and/or diagnosis of SARS-CoV-2 by  FDA under an Emergency Use Authorization (EUA). This EUA will remain  in effect (meaning this test can be used) for the duration of the  Covid-19 declaration under Section 564(b)(1) of the Act, 21  U.S.C. section 360bbb-3(b)(1), unless the authorization is  terminated or revoked. Performed at [facility], [address] [place], [place] [address]     Lab Basic Metabolic Panel: Recent Labs  Lab  0000 05/14/2019 0654 05/30/19 1236  05/30/19 1236 05/31/19 0454 05/31/19 0454 05/31/19 2025 06/01/19 0344 06/02/19 0442 06/02/19 1952 06/08/2019 0443  NA  --  141  --   --   --   --  137 141 138  --  136  K   < > 3.5  --   --  3.2*   < > 3.7 3.2* 3.0*  3.0* 3.3* 3.3*  CL  --  109  --   --   --   --  104 108 107  --  107  CO2  --  27  --   --   --   --  27 25 23   --  21*  GLUCOSE  --  130*  --   --   --   --  460* 200* 149*  --  181*  BUN  --  17  --   --   --   --  18 20 25*  --  26*  CREATININE  --  2.54*  --   --   --   --  2.18* 2.18* 2.45*  --  2.68*  CALCIUM  --  8.2*  --   --   --   --  8.1* 8.5* 8.1*  --  8.3*  MG  --   --  1.5*   < > 2.0  --  1.9 1.8 1.6* 2.0  --   PHOS  --   --  1.8*  --  5.0*  --  4.8* 3.9 3.2  3.1  --   --    < > = values in this interval not displayed.   Liver Function Tests: Recent Labs  Lab 05/31/19 2025 06/01/19 0344 06/02/19 0442  AST 23 24  --   ALT 22 19  --   ALKPHOS 72 74  --   BILITOT 0.6 0.6  --   PROT 5.9* 5.6*  --   ALBUMIN 1.8* 1.6* 1.5*   Recent Labs  Lab 05/31/19 2025 05/31/19 2140 06/01/19 0344  LIPASE 117*  --  105*  AMYLASE  --  133* 136*   Recent Labs  Lab 06/01/19 0056  AMMONIA 42*   CBC: Recent Labs  Lab 05/31/19 2025 06/01/19 0344 06/01/19 0742 06/02/19 0442 2019/06/04 0443  WBC 13.4* 11.9* 13.1* 13.8* 14.9*  NEUTROABS  --  10.1*  --   --  12.3*  HGB 8.1* 7.6* 8.5* 7.8* 7.3*  HCT 26.2* 25.5* 28.1* 23.3* 23.1*  MCV 97.0 98.8 99.3 90.7 94.7  PLT 135* 115* 114* 132* 141*   Cardiac Enzymes: Recent Labs  Lab 06/05/2019 0654 05/30/19 1236 2019-06-04 0443  CKTOTAL 166 124 253*   Sepsis Labs: Recent Labs  Lab 05/31/19 2025 05/31/19 2140 06/01/19 0015 06/01/19 0344 06/01/19 0742 06/01/19 1022 06/02/19 0442 Jun 04, 2019 0443  PROCALCITON  --  0.45  --   --   --   --  0.55 0.68  WBC   < >  --   --  11.9* 13.1*  --  13.8* 14.9*  LATICACIDVEN  --   --  3.8*  --   --  4.9*  --   --    < > = values in this interval not displayed.      Flora Lipps 06/04/2019, 4:05 PM

## 2019-06-07 NOTE — Progress Notes (Signed)
[facility]  ROUNDING NOTE   Subjective:   Attempted dialysis yesterday. However Central venous catheter was not functioning. Despite two dwells of TPA. Discussed poor prognosis with both daughters over the phone yesterday.   Patient responsive to verbal stimuli with grunts this morning.  Requiring warming blanket.  BIPAP overnight.   No family at bedside.    Objective:  Vital signs in last 24 hours:  Temp:  [96.7 F (35.9 C)-99 F (37.2 C)] 97.7 F (36.5 C) (04/27 0400) Pulse Rate:  [56-72] 65 (04/27 0800) Resp:  [15-35] 22 (04/27 0800) BP: (95-135)/(40-117) 113/75 (04/27 0800) SpO2:  [99 %-100 %] 99 % (04/27 0813) FiO2 (%):  [28 %] 28 % (04/27 0400) Weight:  [126.1 kg] 126.1 kg (04/27 0400)  Weight change:  Filed Weights   05/30/19 1010 05/31/19 0611 06-16-2019 0400  Weight: 65.2 kg 115.7 kg 126.1 kg    Intake/Output: I/O last 3 completed shifts: In: 1610 [I.V.:1878.1; Other:100; NG/GT:1328.2; IV Piggyback:1159.8] Out: 1450 [Stool:1450]   Intake/Output this shift:  No intake/output data recorded.  Physical Exam: General: Critically ill  Head: [place]/AT  Eyes: Anicteric, PERRL  Neck: Supple, trachea midline  Lungs:  Clear to auscultation  Heart: regular  Abdomen:  Soft, nontender, +PEG  Extremities: no peripheral edema.  Neurologic: Not following commands, lethargic  Skin: No lesions  Access: LIJ permcath    Basic Metabolic Panel: Recent Labs  Lab  0000 05/30/2019 0654 06/02/2019 0654 05/30/19 1236 05/30/19 1236 05/31/19 0454 05/31/19 2025 05/31/19 2025 06/01/19 0344 06/02/19 0442 06/02/19 1952 06/16/19 0443  NA  --  141  --   --   --   --  137  --  141 138  --  136  K   < > 3.5   < >  --   --  3.2* 3.7  --  3.2* 3.0*  3.0* 3.3* 3.3*  CL  --  109  --   --   --   --  104  --  108 107  --  107  CO2  --  27  --   --   --   --  27  --  25 23  --  21*  GLUCOSE  --  130*  --   --   --   --  460*  --  200* 149*  --  181*  BUN  --  17  --   --    --   --  18  --  20 25*  --  26*  CREATININE  --  2.54*  --   --   --   --  2.18*  --  2.18* 2.45*  --  2.68*  CALCIUM  --  8.2*   < >  --   --   --  8.1*   < > 8.5* 8.1*  --  8.3*  MG  --   --   --  1.5*   < > 2.0 1.9  --  1.8 1.6* 2.0  --   PHOS  --   --   --  1.8*  --  5.0* 4.8*  --  3.9 3.2  3.1  --   --    < > = values in this interval not displayed.    Liver Function Tests: Recent Labs  Lab 05/31/19 2025 06/01/19 0344 06/02/19 0442  AST 23 24  --   ALT 22 19  --   ALKPHOS 72 74  --  BILITOT 0.6 0.6  --   PROT 5.9* 5.6*  --   ALBUMIN 1.8* 1.6* 1.5*   Recent Labs  Lab 05/31/19 2025 05/31/19 2140 06/01/19 0344  LIPASE 117*  --  105*  AMYLASE  --  133* 136*   Recent Labs  Lab 06/01/19 0056  AMMONIA 42*    CBC: Recent Labs  Lab 05/31/19 2025 06/01/19 0344 06/01/19 0742 06/02/19 0442 06/08/19 0443  WBC 13.4* 11.9* 13.1* 13.8* 14.9*  NEUTROABS  --  10.1*  --   --  12.3*  HGB 8.1* 7.6* 8.5* 7.8* 7.3*  HCT 26.2* 25.5* 28.1* 23.3* 23.1*  MCV 97.0 98.8 99.3 90.7 94.7  PLT 135* 115* 114* 132* 141*    Cardiac Enzymes: Recent Labs  Lab 05/30/2019 0654 05/30/19 1236 06-08-2019 0443  CKTOTAL 166 124 253*    BNP: Invalid input(s): POCBNP  CBG: Recent Labs  Lab 06/02/19 1124 06/02/19 1557 06/02/19 1929 06/02/19 2352 08-Jun-2019 0355  GLUCAP 105* 197* 180* 174* 148*    Microbiology: Results for orders placed or performed during the hospital encounter of 06/06/2019  Blood culture (routine x 2)     Status: Abnormal   Collection Time: 06/02/2019 12:34 PM   Specimen: BLOOD  Result Value Ref Range Status   Specimen Description   Final    BLOOD R UP ARM Performed at [facility], [address] [place], [place] [address]    Special Requests   Final    BOTTLES DRAWN AEROBIC AND ANAEROBIC Blood Culture adequate volume Performed at [facility], [address] [place], [place] [address]    Culture  Setup Time   Final    Organism ID to  follow IN [place] TO, READ BACK BY AND VERIFIED WITH: Earl ON 05/11/19 AT 0210 [facility] Performed at [facility], [address] [place], [place] [address]    Culture (A)  Final    STAPHYLOCOCCUS AUREUS SUSCEPTIBILITIES PERFORMED ON PREVIOUS CULTURE WITHIN THE LAST 5 DAYS. Performed at [facility], [place] [address], [place], [place] [address]    Report Status 05/13/2019 FINAL  Final  Blood Culture ID Panel (Reflexed)     Status: Abnormal   Collection Time: 06/02/2019 12:34 PM  Result Value Ref Range Status   Enterococcus species NOT DETECTED NOT DETECTED Final   Listeria monocytogenes NOT DETECTED NOT DETECTED Final   Staphylococcus species DETECTED (A) NOT DETECTED Final    Comment: CRITICAL RESULT CALLED TO, READ BACK BY AND VERIFIED WITH: SCOTT HALL ON 05/11/19 AT 0210 [facility]    Staphylococcus aureus (BCID) DETECTED (A) NOT DETECTED Final    Comment: Methicillin (oxacillin)-resistant Staphylococcus aureus (MRSA). MRSA is predictably resistant to beta-lactam antibiotics (except ceftaroline). Preferred therapy is vancomycin unless clinically contraindicated. Patient requires contact precautions if  hospitalized. CRITICAL RESULT CALLED TO, READ BACK BY AND VERIFIED WITH: SCOTT HALL ON 05/11/19 AT 0210 [facility]    Methicillin resistance DETECTED (A) NOT DETECTED Final    Comment: CRITICAL RESULT CALLED TO, READ BACK BY AND VERIFIED WITH: SCOTT HALL ON 05/11/19 AT 0210 [place]    Streptococcus species NOT DETECTED NOT DETECTED Final   Streptococcus agalactiae NOT DETECTED NOT DETECTED Final   Streptococcus pneumoniae NOT DETECTED NOT DETECTED Final   Streptococcus pyogenes NOT DETECTED NOT DETECTED Final   Acinetobacter baumannii NOT DETECTED NOT DETECTED Final   Enterobacteriaceae species NOT DETECTED NOT DETECTED Final   Enterobacter cloacae complex NOT DETECTED  NOT DETECTED Final   Escherichia coli NOT DETECTED  NOT DETECTED Final   Klebsiella oxytoca NOT DETECTED NOT DETECTED Final   Klebsiella pneumoniae NOT DETECTED NOT DETECTED Final   Proteus species NOT DETECTED NOT DETECTED Final   Serratia marcescens NOT DETECTED NOT DETECTED Final   Haemophilus influenzae NOT DETECTED NOT DETECTED Final   Neisseria meningitidis NOT DETECTED NOT DETECTED Final   Pseudomonas aeruginosa NOT DETECTED NOT DETECTED Final   Candida albicans NOT DETECTED NOT DETECTED Final   Candida glabrata NOT DETECTED NOT DETECTED Final   Candida krusei NOT DETECTED NOT DETECTED Final   Candida parapsilosis NOT DETECTED NOT DETECTED Final   Candida tropicalis NOT DETECTED NOT DETECTED Final    Comment: Performed at [facility], [address] [place], [place] [address]  Blood culture (routine x 2)     Status: Abnormal   Collection Time: 05/20/2019  1:24 PM   Specimen: BLOOD  Result Value Ref Range Status   Specimen Description   Final    BLOOD R LATREAL BICEP Performed at [facility], [address], [place], [place] [address]    Special Requests   Final    BOTTLES DRAWN AEROBIC AND ANAEROBIC Blood Culture adequate volume Performed at [facility], [address] [place], [place] [address]    Culture  Setup Time   Final    IN BOTH AEROBIC AND ANAEROBIC BOTTLES GRAM POSITIVE COCCI CRITICAL VALUE NOTED.  VALUE IS CONSISTENT WITH PREVIOUSLY REPORTED AND CALLED VALUE. Performed at [facility], [address] [place], [place] [address]    Culture METHICILLIN RESISTANT STAPHYLOCOCCUS AUREUS (A)  Final   Report Status 05/13/2019 FINAL  Final   Organism ID, Bacteria METHICILLIN RESISTANT STAPHYLOCOCCUS AUREUS  Final      Susceptibility   Methicillin resistant staphylococcus aureus - MIC*    CIPROFLOXACIN >=8 RESISTANT Resistant     ERYTHROMYCIN >=8 RESISTANT Resistant     GENTAMICIN <=0.5 SENSITIVE Sensitive     OXACILLIN >=4 RESISTANT Resistant     TETRACYCLINE <=1 SENSITIVE  Sensitive     VANCOMYCIN <=0.5 SENSITIVE Sensitive     TRIMETH/SULFA >=320 RESISTANT Resistant     CLINDAMYCIN <=0.25 SENSITIVE Sensitive     RIFAMPIN <=0.5 SENSITIVE Sensitive     Inducible Clindamycin NEGATIVE Sensitive     * METHICILLIN RESISTANT STAPHYLOCOCCUS AUREUS  Respiratory Panel by RT PCR (Flu A&B, Covid) - Nasopharyngeal Swab     Status: None   Collection Time: 06/04/2019  1:24 PM   Specimen: Nasopharyngeal Swab  Result Value Ref Range Status   SARS Coronavirus 2 by RT PCR NEGATIVE NEGATIVE Final    Comment: (NOTE) SARS-CoV-2 target nucleic acids are NOT DETECTED. The SARS-CoV-2 RNA is generally detectable in upper respiratoy specimens during the acute phase of infection. The lowest concentration of SARS-CoV-2 viral copies this assay can detect is 131 copies/mL. A negative result does not preclude SARS-Cov-2 infection and should not be used as the sole basis for treatment or other patient management decisions. A negative result may occur with  improper specimen collection/handling, submission of specimen other than nasopharyngeal swab, presence of viral mutation(s) within the areas targeted by this assay, and inadequate number of viral copies (<131 copies/mL). A negative result must be combined with clinical observations, patient history, and epidemiological information. The expected result is Negative. Fact Sheet for Patients:  PinkCheek.be Fact Sheet for Healthcare Providers:  GravelBags.it This test is not yet ap proved or cleared by the [place]  FDA and  has been authorized for detection and/or diagnosis of SARS-CoV-2 by FDA under an Emergency Use Authorization (EUA). This EUA will remain  in effect (meaning this test can be used) for the duration of the COVID-19 declaration under Section 564(b)(1) of the Act, 21 U.S.C. section 360bbb-3(b)(1), unless the authorization is terminated or revoked sooner.     Influenza A by PCR NEGATIVE NEGATIVE Final   Influenza B by PCR NEGATIVE NEGATIVE Final    Comment: (NOTE) The Xpert Xpress SARS-CoV-2/FLU/RSV assay is intended as an aid in  the diagnosis of influenza from Nasopharyngeal swab specimens and  should not be used as a sole basis for treatment. Nasal washings and  aspirates are unacceptable for Xpert Xpress SARS-CoV-2/FLU/RSV  testing. Fact Sheet for Patients: PinkCheek.be Fact Sheet for Healthcare Providers: GravelBags.it This test is not yet approved or cleared by the [place] FDA and  has been authorized for detection and/or diagnosis of SARS-CoV-2 by  FDA under an Emergency Use Authorization (EUA). This EUA will remain  in effect (meaning this test can be used) for the duration of the  Covid-19 declaration under Section 564(b)(1) of the Act, 21  U.S.C. section 360bbb-3(b)(1), unless the authorization is  terminated or revoked. Performed at [facility], [address] [place], [place] [address]   MRSA PCR Screening     Status: None   Collection Time: 06/01/2019  6:27 PM   Specimen: Nasopharyngeal  Result Value Ref Range Status   MRSA by PCR NEGATIVE NEGATIVE Final    Comment:        The GeneXpert MRSA Assay (FDA approved for NASAL specimens only), is one component of a comprehensive MRSA colonization surveillance program. It is not intended to diagnose MRSA infection nor to guide or monitor treatment for MRSA infections. Performed at [facility], [address] [place], [place] [address]   Urine culture     Status: Abnormal   Collection Time: 06/06/2019 10:30 PM   Specimen: Urine, Clean Catch  Result Value Ref Range Status   Specimen Description   Final    URINE, CLEAN CATCH Performed at [facility], [address], [place], [place] [address]    Special Requests   Final    NONE Performed at [facility], [place], [place] [address]    Culture (A)  Final    >=100,000 COLONIES/mL KLEBSIELLA PNEUMONIAE >=100,000 COLONIES/mL VANCOMYCIN RESISTANT ENTEROCOCCUS    Report Status 05/14/2019 FINAL  Final   Organism ID, Bacteria KLEBSIELLA PNEUMONIAE (A)  Final   Organism ID, Bacteria VANCOMYCIN RESISTANT ENTEROCOCCUS (A)  Final      Susceptibility   Klebsiella pneumoniae - MIC*    AMPICILLIN >=32 RESISTANT Resistant     CEFAZOLIN <=4 SENSITIVE Sensitive     CEFTRIAXONE <=0.25 SENSITIVE Sensitive     CIPROFLOXACIN <=0.25 SENSITIVE Sensitive     GENTAMICIN <=1 SENSITIVE Sensitive     IMIPENEM 0.5 SENSITIVE Sensitive     NITROFURANTOIN 64 INTERMEDIATE Intermediate     TRIMETH/SULFA <=20 SENSITIVE Sensitive     AMPICILLIN/SULBACTAM 4 SENSITIVE Sensitive     PIP/TAZO <=4 SENSITIVE Sensitive     * >=100,000 COLONIES/mL KLEBSIELLA PNEUMONIAE   Vancomycin resistant enterococcus - MIC*    AMPICILLIN >=32 RESISTANT Resistant     NITROFURANTOIN 256 RESISTANT Resistant     VANCOMYCIN >=32 RESISTANT Resistant     LINEZOLID 2 SENSITIVE Sensitive     * >=100,000 COLONIES/mL VANCOMYCIN RESISTANT ENTEROCOCCUS  Cath Tip  Culture     Status: Abnormal   Collection Time: 05/11/19  1:10 PM   Specimen: Catheter Tip; Other  Result Value Ref Range Status   Specimen Description   Final    CATH TIP Performed at [facility], [address] [place], [place] [address]    Special Requests   Final    NONE Performed at [facility], [address] [place], [place] [address]    Culture (A)  Final    >=100,000 COLONIES/mL METHICILLIN RESISTANT STAPHYLOCOCCUS AUREUS   Report Status 05/13/2019 FINAL  Final   Organism ID, Bacteria METHICILLIN RESISTANT STAPHYLOCOCCUS AUREUS (A)  Final      Susceptibility   Methicillin resistant staphylococcus aureus - MIC*    CIPROFLOXACIN >=8 RESISTANT Resistant     ERYTHROMYCIN >=8 RESISTANT Resistant     GENTAMICIN <=0.5 SENSITIVE Sensitive     OXACILLIN  >=4 RESISTANT Resistant     TETRACYCLINE <=1 SENSITIVE Sensitive     VANCOMYCIN 1 SENSITIVE Sensitive     TRIMETH/SULFA >=320 RESISTANT Resistant     CLINDAMYCIN <=0.25 SENSITIVE Sensitive     RIFAMPIN <=0.5 SENSITIVE Sensitive     Inducible Clindamycin NEGATIVE Sensitive     * >=100,000 COLONIES/mL METHICILLIN RESISTANT STAPHYLOCOCCUS AUREUS  Culture, blood (routine x 2)     Status: Abnormal   Collection Time: 05/11/19  4:27 PM   Specimen: BLOOD  Result Value Ref Range Status   Specimen Description   Final    BLOOD A-LINE Performed at [facility], [address] [place], [place] [address]    Special Requests   Final    BOTTLES DRAWN AEROBIC AND ANAEROBIC Blood Culture adequate volume Performed at [facility], [address] [place], [place] [address]    Culture  Setup Time   Final    GRAM POSITIVE COCCI ANAEROBIC BOTTLE ONLY CRITICAL VALUE NOTED.  VALUE IS CONSISTENT WITH PREVIOUSLY REPORTED AND CALLED VALUE. Performed at [facility], [address] [place], [place] [address]    Culture (A)  Final    STAPHYLOCOCCUS AUREUS SUSCEPTIBILITIES PERFORMED ON PREVIOUS CULTURE WITHIN THE LAST 5 DAYS. Performed at [facility], [place] [address] [place], [place] [address]    Report Status 05/25/2019 FINAL  Final  Culture, blood (routine x 2)     Status: Abnormal   Collection Time: 05/12/19  6:06 AM   Specimen: BLOOD  Result Value Ref Range Status   Specimen Description BLOOD BLOOD RIGHT HAND  Final   Special Requests   Final    BOTTLES DRAWN AEROBIC AND ANAEROBIC Blood Culture adequate volume   Culture  Setup Time   Final    GRAM POSITIVE COCCI ANAEROBIC BOTTLE ONLY CRITICAL RESULT CALLED TO, READ BACK BY AND VERIFIED WITH: DAVID BESANTI AT 10PM ON 05/12/2019 BY MOSLEY,J    Culture METHICILLIN RESISTANT STAPHYLOCOCCUS AUREUS (A)  Final   Report Status 05/14/2019 FINAL  Final   Organism ID, Bacteria METHICILLIN RESISTANT STAPHYLOCOCCUS AUREUS   Final      Susceptibility   Methicillin resistant staphylococcus aureus - MIC*    CIPROFLOXACIN >=8 RESISTANT Resistant     ERYTHROMYCIN >=8 RESISTANT Resistant     GENTAMICIN <=0.5 SENSITIVE Sensitive     OXACILLIN >=4 RESISTANT Resistant     TETRACYCLINE <=1 SENSITIVE Sensitive     VANCOMYCIN 1 SENSITIVE Sensitive     TRIMETH/SULFA >=320 RESISTANT Resistant     CLINDAMYCIN <=0.25 SENSITIVE Sensitive     RIFAMPIN <=0.5 SENSITIVE  Sensitive     Inducible Clindamycin NEGATIVE Sensitive     LINEZOLID Value in next row Sensitive      SENSITIVE2Performed at [place] [address], [place], [place] [address]    * METHICILLIN RESISTANT STAPHYLOCOCCUS AUREUS  CULTURE, BLOOD (ROUTINE X 2) w Reflex to ID Panel     Status: Abnormal   Collection Time: 05/13/19  2:20 PM   Specimen: BLOOD  Result Value Ref Range Status   Specimen Description   Final    BLOOD BLOOD RIGHT HAND Performed at [facility], [address], [place], [place] [address]    Special Requests   Final    BOTTLES DRAWN AEROBIC AND ANAEROBIC Blood Culture adequate volume Performed at [facility], [address], [place], [place] [address]    Culture  Setup Time   Final    GRAM POSITIVE COCCI ANAEROBIC BOTTLE ONLY CRITICAL RESULT CALLED TO, READ BACK BY AND VERIFIED WITH: [facility] KATSOUDAS AT 4403 05/14/19 SDR Performed at [facility], [address] [place], [place] [address]    Culture (A)  Final    STAPHYLOCOCCUS AUREUS SUSCEPTIBILITIES PERFORMED ON PREVIOUS CULTURE WITHIN THE LAST 5 DAYS. Performed at [facility], [place] [address] [place], [place] [address]    Report Status 05/16/2019 FINAL  Final  CULTURE, BLOOD (ROUTINE X 2) w Reflex to ID Panel     Status: None   Collection Time: 05/13/19  3:39 PM   Specimen: BLOOD  Result Value Ref Range Status   Specimen Description BLOOD BLOOD LEFT HAND  Final   Special Requests   Final    BOTTLES DRAWN AEROBIC ONLY Blood  Culture adequate volume   Culture   Final    NO GROWTH 5 DAYS Performed at [facility], [address] [place], [place] [address]    Report Status 05/18/2019 FINAL  Final  CULTURE, BLOOD (ROUTINE X 2) w Reflex to ID Panel     Status: Abnormal   Collection Time: 05/14/19  3:33 PM   Specimen: BLOOD  Result Value Ref Range Status   Specimen Description   Final    BLOOD BLOOD RIGHT HAND Performed at [facility], [address] [place], [place] [address]    Special Requests   Final    BOTTLES DRAWN AEROBIC AND ANAEROBIC Blood Culture adequate volume Performed at [facility], [address] [place], [place] [address]    Culture  Setup Time   Final    GRAM POSITIVE COCCI IN CLUSTERS ANAEROBIC BOTTLE ONLY CRITICAL VALUE NOTED.  VALUE IS CONSISTENT WITH PREVIOUSLY REPORTED AND CALLED VALUE.    Culture (A)  Final    STAPHYLOCOCCUS AUREUS SEE SEPARATE REPORT IN [facility] FOR DOS 05/14/19. Performed at [facility], [place] [address] [place], [place] [address]    Report Status 05/24/2019 FINAL  Final  Cath Tip Culture     Status: None   Collection Time: 05/14/19  4:24 PM   Specimen: Catheter Tip; Other  Result Value Ref Range Status   Specimen Description   Final    CATH TIP Performed at [facility], [address], [place], [place] [address]    Special Requests   Final    NONE Performed at [facility], [address] [place],  [address]    Culture   Final    NO GROWTH 3 DAYS Performed at [facility], [place] [address] [place], [place]  [address]    Report Status 05/17/2019 FINAL  Final  CULTURE, BLOOD (ROUTINE X 2) w Reflex to ID Panel     Status: Abnormal   Collection Time: 05/14/19  6:25 PM   Specimen: BLOOD  Result Value Ref Range Status   Specimen Description   Final    BLOOD BLOOD LEFT HAND Performed at [facility], [address] [place], [place] [address]    Special Requests   Final     BOTTLES DRAWN AEROBIC AND ANAEROBIC Blood Culture results may not be optimal due to an inadequate volume of blood received in culture bottles Performed at [facility], [address], [place], [place] [address]    Culture  Setup Time   Final    GRAM POSITIVE COCCI ANAEROBIC BOTTLE ONLY CRITICAL VALUE NOTED.  VALUE IS CONSISTENT WITH PREVIOUSLY REPORTED AND CALLED VALUE. [place] Performed at [facility], [address] [place], [place] [address]    Culture (A)  Final    STAPHYLOCOCCUS AUREUS SUSCEPTIBILITIES PERFORMED ON PREVIOUS CULTURE WITHIN THE LAST 5 DAYS. Performed at [facility], [place] [address] [place], [place] [address]    Report Status 05/17/2019 FINAL  Final  Culture, blood (Routine X 2) w Reflex to ID Panel     Status: None   Collection Time: 05/16/19 10:45 AM   Specimen: BLOOD  Result Value Ref Range Status   Specimen Description BLOOD RIGHT HAND  Final   Special Requests BLOOD Blood Culture adequate volume  Final   Culture   Final    NO GROWTH 5 DAYS Performed at [facility], [address], [place], [place] [address]    Report Status 05/23/2019 FINAL  Final  Culture, blood (Routine X 2) w Reflex to ID Panel     Status: None   Collection Time: 05/16/19 12:38 PM   Specimen: BLOOD  Result Value Ref Range Status   Specimen Description BLOOD RT HAND  Final   Special Requests   Final    BOTTLES DRAWN AEROBIC AND ANAEROBIC Blood Culture adequate volume   Culture   Final    NO GROWTH 5 DAYS Performed at [facility], [address] [place],  [address]    Report Status 05/23/2019 FINAL  Final  Respiratory Panel by RT PCR (Flu A&B, Covid) - Nasopharyngeal Swab     Status: None   Collection Time: 05/28/2019 12:38 PM   Specimen: Nasopharyngeal Swab  Result Value Ref Range Status   SARS Coronavirus 2 by RT PCR NEGATIVE NEGATIVE Final    Comment: (NOTE) SARS-CoV-2 target nucleic acids are NOT DETECTED. The SARS-CoV-2  RNA is generally detectable in upper respiratoy specimens during the acute phase of infection. The lowest concentration of SARS-CoV-2 viral copies this assay can detect is 131 copies/mL. A negative result does not preclude SARS-Cov-2 infection and should not be used as the sole basis for treatment or other patient management decisions. A negative result may occur with  improper specimen collection/handling, submission of specimen other than nasopharyngeal swab, presence of viral mutation(s) within the areas targeted by this assay, and inadequate number of viral copies (<131 copies/mL). A negative result must be combined with clinical observations, patient history, and epidemiological information. The expected result is Negative. Fact Sheet for Patients:  PinkCheek.be Fact Sheet for Healthcare Providers:  GravelBags.it This test is not yet ap proved or cleared by the [place] FDA and  has been authorized for detection and/or diagnosis of SARS-CoV-2 by FDA  under an Emergency Use Authorization (EUA). This EUA will remain  in effect (meaning this test can be used) for the duration of the COVID-19 declaration under Section 564(b)(1) of the Act, 21 U.S.C. section 360bbb-3(b)(1), unless the authorization is terminated or revoked sooner.    Influenza A by PCR NEGATIVE NEGATIVE Final   Influenza B by PCR NEGATIVE NEGATIVE Final    Comment: (NOTE) The Xpert Xpress SARS-CoV-2/FLU/RSV assay is intended as an aid in  the diagnosis of influenza from Nasopharyngeal swab specimens and  should not be used as a sole basis for treatment. Nasal washings and  aspirates are unacceptable for Xpert Xpress SARS-CoV-2/FLU/RSV  testing. Fact Sheet for Patients: PinkCheek.be Fact Sheet for Healthcare Providers: GravelBags.it This test is not yet approved or cleared by the [place] FDA  and  has been authorized for detection and/or diagnosis of SARS-CoV-2 by  FDA under an Emergency Use Authorization (EUA). This EUA will remain  in effect (meaning this test can be used) for the duration of the  Covid-19 declaration under Section 564(b)(1) of the Act, 21  U.S.C. section 360bbb-3(b)(1), unless the authorization is  terminated or revoked. Performed at [facility], [address] [place], [place] [address]     Coagulation Studies: No results for input(s): LABPROT, INR in the last 72 hours.  Urinalysis: No results for input(s): COLORURINE, LABSPEC, PHURINE, GLUCOSEU, HGBUR, BILIRUBINUR, KETONESUR, PROTEINUR, UROBILINOGEN, NITRITE, LEUKOCYTESUR in the last 72 hours.  Invalid input(s): APPERANCEUR    Imaging: No results found.   Medications:   . sodium chloride 5 mL/hr at 06-15-19 0550  . ceFTAROline (TEFLARO) IV 200 mg (06/15/19 0929)  . DAPTOmycin (CUBICIN)  IV Stopped (06/01/19 2206)  . dextrose 50 mL/hr at 06-15-19 0550  . feeding supplement (OSMOLITE 1.5 CAL) 1,000 mL (06/02/19 1100)   . allopurinol  100 mg Oral QPM  . vitamin C  500 mg Per Tube BID  . B-complex with vitamin C  1 tablet Per Tube Daily  . brimonidine  1 drop Both Eyes Q12H   And  . timolol  1 drop Both Eyes Q12H  . Chlorhexidine Gluconate Cloth  6 each Topical Daily  . clopidogrel  75 mg Oral QPM  . epoetin (EPOGEN/PROCRIT) injection  4,000 Units Intravenous Q M,W,F-HD  . feeding supplement (PRO-STAT SUGAR FREE 64)  30 mL Per Tube Daily  . free water  30 mL Per Tube Q4H  . insulin aspart  0-9 Units Subcutaneous Q4H  . latanoprost  1 drop Both Eyes QPM  . levETIRAcetam  1,000 mg Oral QPM  . megestrol  400 mg Oral BID  . midodrine  5 mg Oral Q M,W,F  . pantoprazole  40 mg Oral QHS  . sertraline  50 mg Oral Daily   sodium chloride, acetaminophen (TYLENOL) oral liquid 160 mg/5 mL, ondansetron (ZOFRAN) IV  Assessment/ Plan:  [name] is a 74 y.o. black female with end  stage renal disease on hemodialysis, end-stage on hemodialysis, CVA with left-sided hemiparesis, hypertension, coronary artery disease, GERD, diabetes mellitus type 2, gout, seizure disorder who was admitted on 06/01/2019 with bacteremia, septic shock. Found to have MRSA in blood cultures. Blood cultures from 05/16/2019 were negative. Currently on ceftaroline and daptomycin.   [facility] MWF [facility] 63.5kg CVC  1. End Stage Renal Disease: with dialysis device complication. TPA dwell yesterday not successful. No acute indication for dialysis today. Will keep on MWF schedule.  - Consult vascular.   2. Hypotension with MRSA bacteremia/sepsis  Holding home blood pressure agents.  - ceftaroline and daptomycin.  - midodrine  3. Anemia of chronic disease: hemoglobin 7.3 - EPO with HD treatment.   4. Secondary Hyperparathyroidism: Calcium and phosphorus at goal. No indication for binders.   Overall prognosis is poor. Patient is most likely suffering.    LOS: 24 Andrian Sabala 05/15/219:33 AM

## 2019-06-07 NOTE — Progress Notes (Signed)
Nutrition Follow-up  DOCUMENTATION CODES:   Not applicable  INTERVENTION:  Resume Osmolite 1.5 Cal at 45 mL/hr + Pro-Stat 30 mL once daily per tube. Provides 1720 kcal, 83 grams of protein, 821 mL H2O daily.  Continue free water flush of 30 mL Q4hrs to maintain tube patency.  Continue B-complex with C daily per tube and vitamin C 500 mg BID per tube.  NUTRITION DIAGNOSIS:   Inadequate oral intake related to poor appetite, dysphagia as evidenced by meal completion < 50%.  Ongoing - addressing with tube feed regimen.  GOAL:   Patient will meet greater than or equal to 90% of their needs  Met with TF regimen.  MONITOR:   PO intake, Supplement acceptance, Weight trends, Labs, I & O's, Diet advancement, Skin  REASON FOR ASSESSMENT:   Consult Poor PO  ASSESSMENT:   Patient with PMH significant for ESRD on HD, dysphagia due to Schatzki's ring with past dilations, CAD, DM, GERD, HLD, and HTN. Presents this admission with MRSA bacteremia.  -Patient s/p placement of PEG tube on 4/22. -On 4/24 patient had AMS and acute hypoxic hypercapnic respiratory failure requiring transfer to ICU for BiPAP. She was made NPO.  Patient did not tolerate HD yesterday. Patient is obtunded with poor prognosis. Tube feeds were resumed yesterday at trickle rate. Plan is to advance back to goal rate today. Undergoing goals of care discussions with family.  Medications reviewed and include: vitamin C 500 mg BID per tube, B-complex with C daily per tube, Epogen 4000 units with HD, free water flush 30 ml Q4hrs, Novolog 0-9 units Q4hrs, Protonix.  Labs reviewed: CBG 148-207, Potassium 3.3, CO2 21, BUN 26, Creatinine 2.68.  Enteral Access: PEG  Diet Order:   Diet Order            Diet NPO time specified  Diet effective now             EDUCATION NEEDS:   Not appropriate for education at this time  Skin:  Skin Assessment: Skin Integrity Issues:(unstageable left heel; DTI left big toe; stg 2  sacrum) Skin Integrity Issues:: Unstageable, Other (Comment) Unstageable: L heel Other: L toe  Last BM:  2019/06/09  Height:   Ht Readings from Last 1 Encounters:  05/13/2019 5' 2"  (1.575 m)   Weight:   Wt Readings from Last 1 Encounters:  Jun 09, 2019 126.1 kg   BMI:  Body mass index is 50.85 kg/m.  Estimated Nutritional Needs:   Kcal:  1600-1800kcal/day  Protein:  80-90g/day  Fluid:  UOP +1L  Jacklynn Barnacle, MS, RD, LDN Pager number available on Amion

## 2019-06-07 NOTE — Progress Notes (Signed)
CRITICAL CARE PROGRESS NOTE    Name: Crystal Vargas MRN: [id number] DOB: 28-Aug-1945     LOS: 32   SUBJECTIVE FINDINGS & SIGNIFICANT EVENTS   Patient description:  74 y.o. Female initially admitted 05/17/2019 with MRSA bacteremia & septic shock due to infected Hemodialysis catheter.  Catheter removed, and ID following.  Receiving Daptomycin & Ceftaroline.  On 4/24 developed Acute Metabolic Encephalopathy & Acute Hypoxic Hypercapnic Respiratory Failure secondary to Acute Decompensated HFrEF and possible pneumonia requiring BiPAP.  Lines / Drains: Right and left chest port  Cultures / Sepsis markers: Staph aureus + blood culture  Antibiotics: Teflaro and dapto per ID   Protocols / Consultants: PCCM, hospitalist, pharmacy, nephrology   SIGNIFICANT EVENTS  4/3: Admission to ICU 4/4: Infected dialysis catheter removed MRSA bacteremia, persistant 4/12: New Left IJ perm cath placed by Vascular Surgery 4/14: EGD revealed esophageal stenosis, dilation performed 4/20: TEE with no vegetations 4/21: Placement of tunneled single lumen Hickman catheter for long term abx 4/22: PEG tube placed 4/24: AMS and Acute hypoxic hypercapnic respiratory failure requiring transfer to ICU for BiPAP; PCCM consulted. 4/25: Did not tolerate MRI, became bradycardic (HR 30's) and hypoxic when laid flat on table 4/25: More awake and alert s/p BiPAP 4/25 - Patient is critically ill with overall poor prognosis due to advanced age with multiple severe chronic medical conditions with prolonged hospitalization and sepsis bacteremia.  4/26-4/27 DID NOT Tolerate HD, patient on bIPAP, obtunded Very poor prognosis  CC Follow up resp failure  HPI Poor prognosis resp failure on biPAP Patient is suffering   MEDICATIONS   Current  Medication:  Current Facility-Administered Medications:  .  0.9 %  sodium chloride infusion, , Intravenous, PRN, Schnier, Dolores Lory, MD, Last Rate: 5 mL/hr at 04-Jun-2019 0550, Rate Verify at 06-04-2019 0550 .  acetaminophen (TYLENOL) 160 MG/5ML solution 650 mg, 650 mg, Per Tube, Q6H PRN, Darel Hong D, NP, 650 mg at 06/02/19 1352 .  allopurinol (ZYLOPRIM) tablet 100 mg, 100 mg, Oral, QPM, Schnier, Dolores Lory, MD, 100 mg at 06/02/19 1820 .  ascorbic acid (VITAMIN C) tablet 500 mg, 500 mg, Per Tube, BID, Fritzi Mandes, MD, 500 mg at 06/02/19 1947 .  B-complex with vitamin C tablet 1 tablet, 1 tablet, Per Tube, Daily, Fritzi Mandes, MD, 1 tablet at 06/02/19 2126 .  brimonidine (ALPHAGAN) 0.2 % ophthalmic solution 1 drop, 1 drop, Both Eyes, Q12H, 1 drop at 06/02/19 2122 **AND** timolol (TIMOPTIC) 0.5 % ophthalmic solution 1 drop, 1 drop, Both Eyes, Q12H, Schnier, Dolores Lory, MD, 1 drop at 06/02/19 2122 .  ceftaroline (TEFLARO) 200 mg in sodium chloride 0.9 % 250 mL IVPB, 200 mg, Intravenous, Q8H, Schnier, Dolores Lory, MD, Stopped at 04-Jun-2019 0121 .  Chlorhexidine Gluconate Cloth 2 % PADS 6 each, 6 each, Topical, Daily, Schnier, Dolores Lory, MD, 6 each at 06/02/19 [phone] .  clopidogrel (PLAVIX) tablet 75 mg, 75 mg, Oral, QPM, Fritzi Mandes, MD, 75 mg at 06/02/19 1821 .  DAPTOmycin (CUBICIN) 500 mg in sodium chloride 0.9 % IVPB, 500 mg, Intravenous, Q48H, Schnier, Dolores Lory, MD, Stopped at 06/01/19 2206 .  dextrose 10 % infusion, , Intravenous, Continuous, Fritzi Mandes, MD, Last Rate: 50 mL/hr at 06-04-19 0550, Rate Verify at 04-Jun-2019 0550 .  epoetin alfa (EPOGEN) injection 4,000 Units, 4,000 Units, Intravenous, Q M,W,F-HD, Schnier, Dolores Lory, MD, 4,000 Units at 06/02/19 1149 .  feeding supplement (OSMOLITE 1.5 CAL) liquid 1,000 mL, 1,000 mL, Per Tube, Continuous, Anastaisa Wooding, MD, Last Rate: 20 mL/hr  at 06/02/19 1100, 1,000 mL at 06/02/19 1100 .  feeding supplement (PRO-STAT SUGAR FREE 64) liquid 30 mL, 30 mL, Per Tube,  Daily, Fritzi Mandes, MD, 30 mL at 06/02/19 1015 .  free water 30 mL, 30 mL, Per Tube, Q4H, Fritzi Mandes, MD, 30 mL at 07/02/19 0529 .  insulin aspart (novoLOG) injection 0-9 Units, 0-9 Units, Subcutaneous, Q4H, Ottie Glazier, MD, 1 Units at 07-02-2019 0421 .  latanoprost (XALATAN) 0.005 % ophthalmic solution 1 drop, 1 drop, Both Eyes, QPM, Schnier, Dolores Lory, MD, 1 drop at 06/02/19 1834 .  levETIRAcetam (KEPPRA) tablet 1,000 mg, 1,000 mg, Oral, QPM, Schnier, Dolores Lory, MD, 1,000 mg at 06/02/19 1821 .  megestrol (MEGACE) 400 MG/10ML suspension 400 mg, 400 mg, Oral, BID, Schnier, Dolores Lory, MD, 400 mg at 06/02/19 2122 .  midodrine (PROAMATINE) tablet 5 mg, 5 mg, Oral, Q M,W,F, Schnier, Dolores Lory, MD, 5 mg at 06/02/19 1016 .  ondansetron (ZOFRAN) injection 4 mg, 4 mg, Intravenous, Q6H PRN, Schnier, Dolores Lory, MD .  pantoprazole (PROTONIX) EC tablet 40 mg, 40 mg, Oral, QHS, Schnier, Dolores Lory, MD, 40 mg at 06/01/19 2145 .  sertraline (ZOLOFT) tablet 50 mg, 50 mg, Oral, Daily, Schnier, Dolores Lory, MD, 50 mg at 06/02/19 1015    ALLERGIES   Abacavir, Cephalosporins, Ciprofloxacin, Nsaids, Penicillin g, Quinolones, and Salicylates   REVIEW OF SYSTEMS  PATIENT IS UNABLE TO PROVIDE COMPLETE REVIEW OF SYSTEM S DUE TO SEVERE CRITICAL ILLNESS AND ENCEPHALOPATHY   PHYSICAL EXAMINATION   Vital Signs: Temp:  [96.7 F (35.9 C)-99 F (37.2 C)] 97.7 F (36.5 C) (04/27 0400) Pulse Rate:  [55-72] 62 (04/27 0600) Resp:  [14-35] 26 (04/27 0600) BP: (91-135)/(40-117) 112/61 (04/27 0600) SpO2:  [100 %] 100 % (04/27 0600) FiO2 (%):  [28 %-30 %] 28 % (04/27 0400) Weight:  [126.1 kg] 126.1 kg (04/27 0400)  PHYSICAL EXAMINATION:  GENERAL:critically ill appearing, +resp distress on BIPAP HEAD: Normocephalic, atraumatic.  EYES: Pupils equal, round, reactive to light.  No scleral icterus.  MOUTH: Moist mucosal membrane. NECK: Supple. No thyromegaly. No nodules. No JVD.  PULMONARY: +rhonchi,  +wheezing CARDIOVASCULAR: S1 and S2. Regular rate and rhythm. No murmurs, rubs, or gallops.  GASTROINTESTINAL: Soft, nontender, -distended. Positive bowel sounds.  MUSCULOSKELETAL: No swelling, clubbing, or edema.  NEUROLOGIC: obtunded SKIN:intact,warm,dry     PERTINENT DATA     Infusions: . sodium chloride 5 mL/hr at 2019-07-02 0550  . ceFTAROline (TEFLARO) IV Stopped (07-02-19 0121)  . DAPTOmycin (CUBICIN)  IV Stopped (06/01/19 2206)  . dextrose 50 mL/hr at 07/02/2019 0550  . feeding supplement (OSMOLITE 1.5 CAL) 1,000 mL (06/02/19 1100)   Scheduled Medications: . allopurinol  100 mg Oral QPM  . vitamin C  500 mg Per Tube BID  . B-complex with vitamin C  1 tablet Per Tube Daily  . brimonidine  1 drop Both Eyes Q12H   And  . timolol  1 drop Both Eyes Q12H  . Chlorhexidine Gluconate Cloth  6 each Topical Daily  . clopidogrel  75 mg Oral QPM  . epoetin (EPOGEN/PROCRIT) injection  4,000 Units Intravenous Q M,W,F-HD  . feeding supplement (PRO-STAT SUGAR FREE 64)  30 mL Per Tube Daily  . free water  30 mL Per Tube Q4H  . insulin aspart  0-9 Units Subcutaneous Q4H  . latanoprost  1 drop Both Eyes QPM  . levETIRAcetam  1,000 mg Oral QPM  . megestrol  400 mg Oral BID  . midodrine  5 mg Oral  Q M,W,F  . pantoprazole  40 mg Oral QHS  . sertraline  50 mg Oral Daily   PRN Medications: sodium chloride, acetaminophen (TYLENOL) oral liquid 160 mg/5 mL, ondansetron (ZOFRAN) IV Hemodynamic parameters:   Intake/Output: 04/26 0701 - 04/27 0700 In: 3366.3 [I.V.:1228.5; UR/KY:7062.3; IV Piggyback:799.6] Out: 1000 [Stool:1000]  Ventilator  Settings: FiO2 (%):  [28 %-30 %] 28 %   LAB RESULTS:  Basic Metabolic Panel: Recent Labs  Lab  0000 05/30/2019 0654 05/30/19 1236 05/30/19 1236 05/31/19 0454 05/31/19 0454 05/31/19 2025 05/31/19 2025 06/01/19 0344 06/01/19 0344 06/02/19 0442 06/02/19 0442 06/02/19 1952 06/15/19 0443  NA  --  141  --   --   --   --  137  --  141  --  138   --   --  136  K   < > 3.5  --   --  3.2*   < > 3.7   < > 3.2*   < > 3.0*  3.0*   < > 3.3* 3.3*  CL  --  109  --   --   --   --  104  --  108  --  107  --   --  107  CO2  --  27  --   --   --   --  27  --  25  --  23  --   --  21*  GLUCOSE  --  130*  --   --   --   --  460*  --  200*  --  149*  --   --  181*  BUN  --  17  --   --   --   --  18  --  20  --  25*  --   --  26*  CREATININE  --  2.54*  --   --   --   --  2.18*  --  2.18*  --  2.45*  --   --  2.68*  CALCIUM  --  8.2*  --   --   --   --  8.1*  --  8.5*  --  8.1*  --   --  8.3*  MG  --   --  1.5*   < > 2.0  --  1.9  --  1.8  --  1.6*  --  2.0  --   PHOS  --   --  1.8*  --  5.0*  --  4.8*  --  3.9  --  3.2  3.1  --   --   --    < > = values in this interval not displayed.   Liver Function Tests: Recent Labs  Lab 05/31/19 2025 06/01/19 0344 06/02/19 0442  AST 23 24  --   ALT 22 19  --   ALKPHOS 72 74  --   BILITOT 0.6 0.6  --   PROT 5.9* 5.6*  --   ALBUMIN 1.8* 1.6* 1.5*   Recent Labs  Lab 05/31/19 2025 05/31/19 2140 06/01/19 0344  LIPASE 117*  --  105*  AMYLASE  --  133* 136*   Recent Labs  Lab 06/01/19 0056  AMMONIA 42*   CBC: Recent Labs  Lab 05/31/19 2025 06/01/19 0344 06/01/19 0742 06/02/19 0442 Jun 15, 2019 0443  WBC 13.4* 11.9* 13.1* 13.8* 14.9*  NEUTROABS  --  10.1*  --   --  12.3*  HGB 8.1* 7.6* 8.5* 7.8* 7.3*  HCT 26.2*  25.5* 28.1* 23.3* 23.1*  MCV 97.0 98.8 99.3 90.7 94.7  PLT 135* 115* 114* 132* 141*   Cardiac Enzymes: Recent Labs  Lab 05/11/2019 0654 05/30/19 1236 2019/06/14 0443  CKTOTAL 166 124 253*   BNP: Invalid input(s): POCBNP CBG: Recent Labs  Lab 06/02/19 1124 06/02/19 1557 06/02/19 1929 06/02/19 2352 06-14-2019 0355  GLUCAP 105* 197* 180* 174* 148*     IMAGING RESULTS:  Imaging: No results found.    ASSESSMENT AND PLAN   Severe ACUTE Hypoxic and Hypercapnic Respiratory Failure On biPAP, secondary to acute pulm edema, aspiration pneumonitis with b/l effusions High  risk for intubation  ACUTE SYSTOLIC CARDIAC FAILURE Poor prognosis  ESRD  KIDNEY INJURY/Renal Failure Did NOT tolerate HD due to clottingof cath Poor prognosis   SEVERE MALNUTRITION -PEG status -post CVA -bitemporal wasting with muscular atrophy  INFECTIOUS DISEASE -continue antibiotics as prescribed -follow up cultures -follow up ID consultation  ENDO - ICU hypoglycemic\Hyperglycemia protocol -check FSBS per protocol  ELECTROLYTES -follow labs as needed -replace as needed -pharmacy consultation and following    DVT/GI PRX ordered TRANSFUSIONS AS NEEDED MONITOR FSBS ASSESS the need for LABS as needed   Critical Care Time devoted to patient care services described in this note is 34 minutes.   Overall, patient is critically ill, prognosis is guarded.  Patient with Multiorgan failure and at high risk for cardiac arrest and death.    PATIENT SUFFERING, SLOW DYING PROCESS VERY POOR PROGNOSIS AND NO CHANCE OF MEANINGFUL RECOVERY  Contina Strain Patricia Pesa, M.D.  [facility]  Medical Director [place] Director [facility] Cardio-Pulmonary Department

## 2019-06-07 NOTE — Progress Notes (Signed)
Family At bedside, clinical status relayed to family  Updated and notified of patients medical condition-  Progressive multiorgan failure with very low chance of meaningful recovery.  Patient is in dying  Process. Started on multiple vasopressors Needs new vasc cath  Family understands the situation.  They would like to discuss DNR/DNI status with rest of family   Family are satisfied with Plan of action and management. All questions answered  Additional CC time 23 mins  Trae Bovenzi Patricia Pesa, M.D.  [facility]  Medical Director [place] Director [facility] Cardio-Pulmonary Department

## 2019-06-07 NOTE — Progress Notes (Signed)
Patient ID: Crystal Vargas, female   DOB: 09-Mar-1945, 74 y.o.   MRN: [id number]  Patient overall is declining. She is critically ill. Multiple pressers multiple comorbidities and overall not doing well. Discussed with Dr. Mortimer Fries will transfer to his service.  Appreciate his help!

## 2019-06-07 NOTE — Progress Notes (Signed)
Family At bedside, clinical status relayed to family  Updated and notified of patients medical condition-  Progressive multiorgan failure with very low chance of meaningful recovery.  Patient is in dying  Process.  Family understands the situation.  They have consented and agreed to DNR/DNI and would like to proceed with Comfort care measures.   Family are satisfied with Plan of action and management. All questions answered  Levelle Edelen David Krue Peterka, M.D.  [facility]  Medical Director [facility] Medical Director [facility] Cardio-Pulmonary Department     

## 2019-06-07 NOTE — Progress Notes (Signed)
[facility] Daily Progress Note   Subjective: 06/01/2019: 1. Ultrasound guidance for vascular access to the left internal jugular vein 2. Fluoroscopic guidance for placement of catheter 3. Placement of a23cm tip to cuff tunneled hemodialysis catheter via the left internal jugular vein  Patient with clotted permcath unable to dialyze yesterday.  Objective: Vitals:   Jul 02, 2019 0600 07-02-19 0800 07/02/2019 0813 07-02-19 1000  BP: 112/61 113/75  118/75  Pulse: 62 65  68  Resp: (!) 26 (!) 22  (!) 21  Temp:    98.2 F (36.8 C)  TempSrc:    Axillary  SpO2: 100% 99% 99% 98%  Weight:      Height:        Intake/Output Summary (Last 24 hours) at Jul 02, 2019 1055 Last data filed at 07/02/19 0550 Gross per 24 hour  Intake 2956.79 ml  Output 1000 ml  Net 1956.79 ml   Physical Exam: A&Ox1, Critically Ill CV: RRR Pulmonary: Decreased bilaterally Abdomen: Soft, Nontender, Nondistended Vascular:  Left Permcath: Intact, clean and dry  Right Hickman: Intact, clean and dry   Laboratory: CBC    Component Value Date/Time   WBC 14.9 (H) Jul 02, 2019 0443   HGB 7.3 (L) 07/02/2019 0443   HCT 23.1 (L) 2019-07-02 0443   PLT 141 (L) Jul 02, 2019 0443   BMET    Component Value Date/Time   NA 136 Jul 02, 2019 0443   K 3.3 (L) Jul 02, 2019 0443   CL 107 Jul 02, 2019 0443   CO2 21 (L) 07-02-19 0443   GLUCOSE 181 (H) 2019-07-02 0443   BUN 26 (H) 07-02-2019 0443   CREATININE 2.68 (H) 07-02-2019 0443   CALCIUM 8.3 (L) 02-Jul-2019 0443   GFRNONAA 17 (L) 2019-07-02 0443   GFRAA 19 (L) 07/02/2019 0443   Assessment/Planning: The patient is a 74 year old female with multiple medical issues including known history of end-stage renal disease on hemodialysis  1) Nonfunctioning PermCath: Re-consulted by nephrology. Permcath is not functioning If family would like to pursue aggressive measures we will plan on PermCath exchange this afternoon if patient remains stable.  Discussed  with Dr. Eber Hong Gareld Obrecht PA-C Jul 02, 2019 10:55 AM

## 2019-06-07 NOTE — Progress Notes (Addendum)
   06/24/19 1655  Clinical Encounter Type  Visited With Patient and family together  Visit Type Initial;Spiritual support;Critical Care;Death  Referral From Nurse  Consult/Referral To Chaplain  Chaplain received page around 4:55. When she arrived to the ICU, Nurse Laurin explained the situation and that the family just decided to put patient on comfort care. Patient was actively dying. When chaplain entered the room patient's daughters [name] and Lattie Haw were at bedside. Chaplain introduced herself and tried to assess how everyone was doing. At being in the room for a little while, chaplain asked if she could pray and they welcome prayer. Chaplain understood that more family members would be coming. While we were talking Robin's phone and it was her daughter. Lattie Haw went down stairs to get her and to explain the state her grandmother was in. When Granddaughter, Lovena Le entered the room she began to weep. Nurse Laurin was waiting on their brother to come before giving Ms. Thorup meds to make her relaxed enough to take her off of ventilator. Granddaughter had questions about what the med was for. Laurin explained, but chaplain could tell she didn't fully understand that soon after her grandmother was removed from ventilator she would pass. Patient's sister arrived with her husband and son, Mortimer Fries arrived. Shortly after Mortimer Fries arrived Nurse Laurin talked to patient and administered meds, than removed ventilator. Chaplain asked what songs patient liked. She turned on YouTube and played music for about ninety minutes. Around 9, Chaplain went to eat dinner and arrived back about thirty minutes later, chaplain came back to find that about to transition any minute. Sister, asked chaplain to pray. Everyone held hands and chaplain prayed for a peaceful transition for Ms. Goldfarb and than he would bless the family with peace and strength. After patient transitioned everyone seem to be quite calm, they said that they were alright and  felt better. They told their mother that she fought. I stood outside the room and spoke to family members as they left. Granddaughter, Lovena Le told chaplain to always pray for her family. That statement from this young woman, touched [employer]. Chaplain assisted Nurse Aleksey with completing the release form. Chaplain thanked patient's children for allowing her the opportunity to share in this time with them and than she left.

## 2019-06-07 NOTE — Progress Notes (Signed)
Patient requiring multiple STAT nursing interventions throughout day, as well as close monitoring to ensure optimal blood pressure, and respiratory status.  This RN consulted MD, as well as family to ensure information was relayed in timely manner to achieve best possible outcome for patient.  Family educated by this RN numerous times throughout the day in regards to comfort care, as well as possibility of intubation and what the outcome may be.  This RN ensured that all family members had opportunity to ask questions and are satisfied with care. Comfort Care Cart ordered to be placed at bedside.   This RN stopped all IV medications after Comfort Care Measures were placed, and at the request of the family, will remove bipap when son (final family member) is at bedside.

## 2019-06-07 DEATH — deceased

## 2020-09-12 IMAGING — CT CT HEAD W/O CM
3 of 4 series · 16 of 47 positions shown, 19 images · non-contrast
Comparison: May 10, 2019

CLINICAL DATA: Altered mental status.

EXAM:
CT HEAD WITHOUT CONTRAST
TECHNIQUE: Contiguous axial images were obtained from the base of the skull
through the vertex without intravenous contrast.

[Series 2: head wo · axial · 0.41mm/px · z∈[-131,-1]mm · 10 of 32 slices shown, 13 images]
[im 3/32  brain]
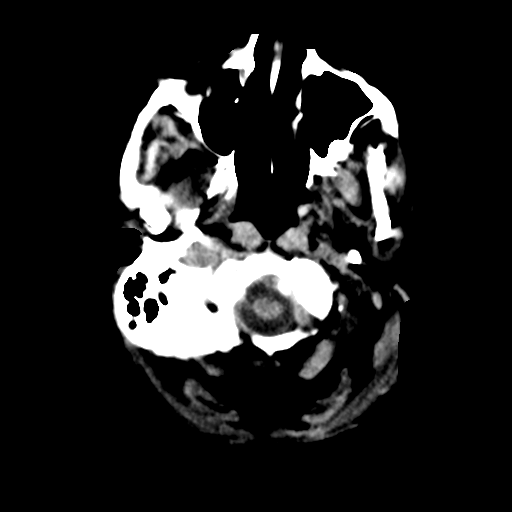
[im 3/32  bone]
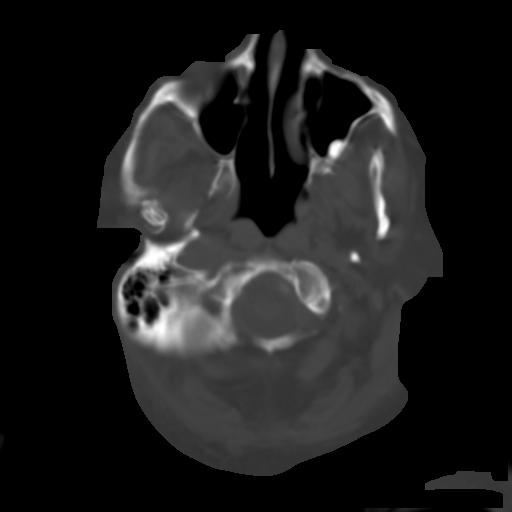
[im 5/32  brain]
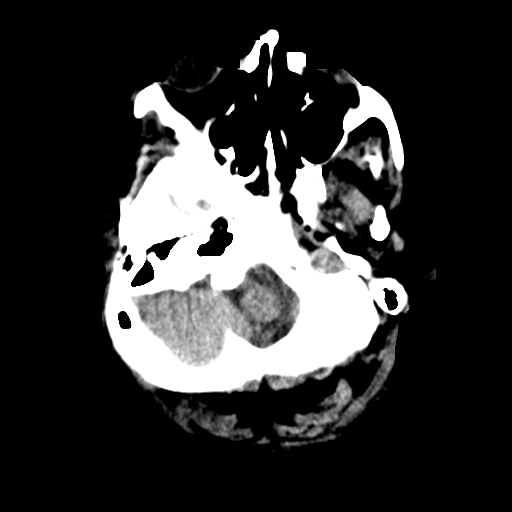
[im 9/32  brain]
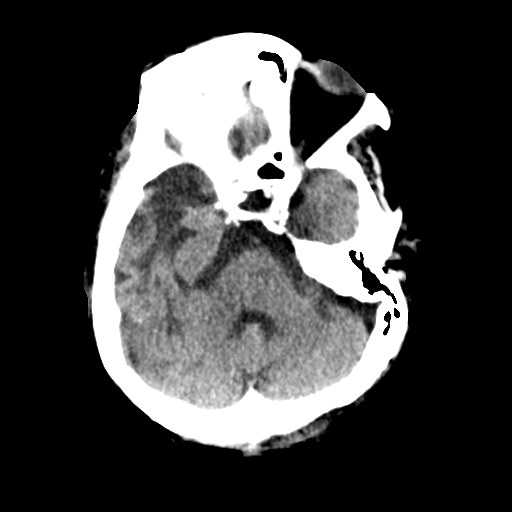
[im 12/32  brain]
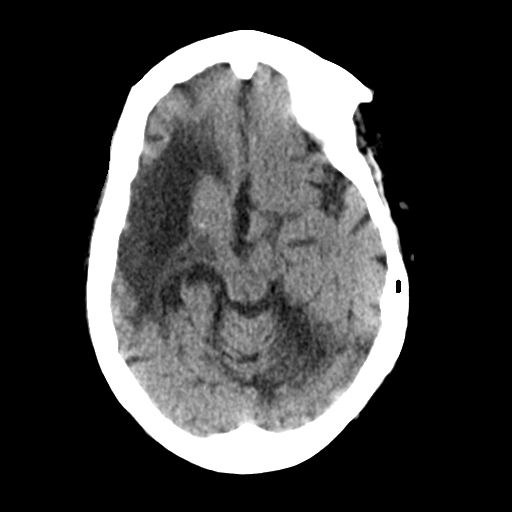
[im 14/32  brain]
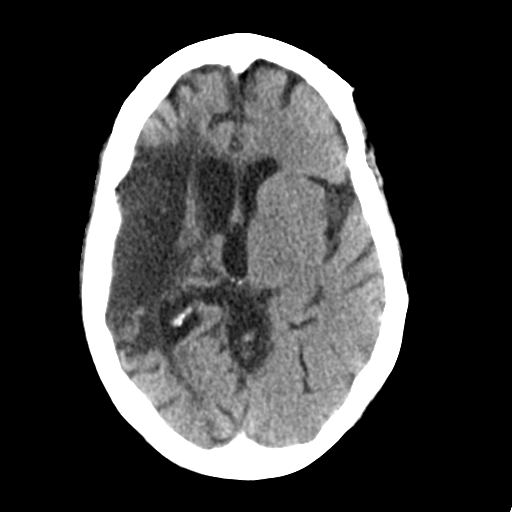
[im 14/32  bone]
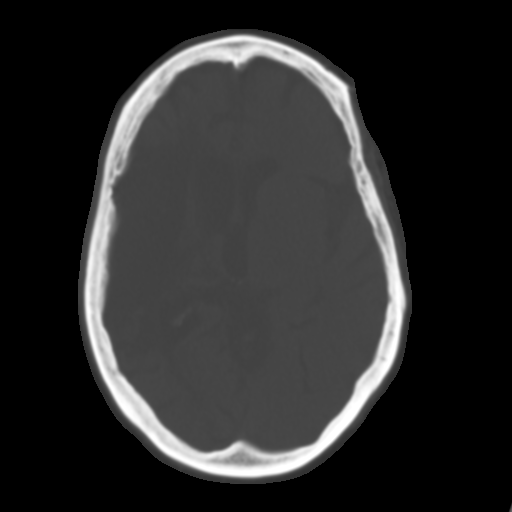
[im 18/32  brain]
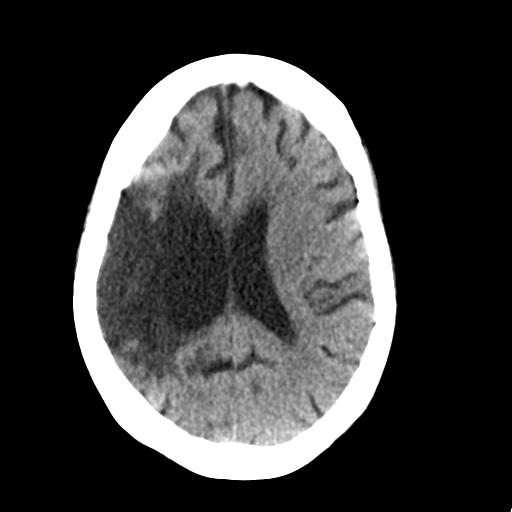
[im 20/32  brain]
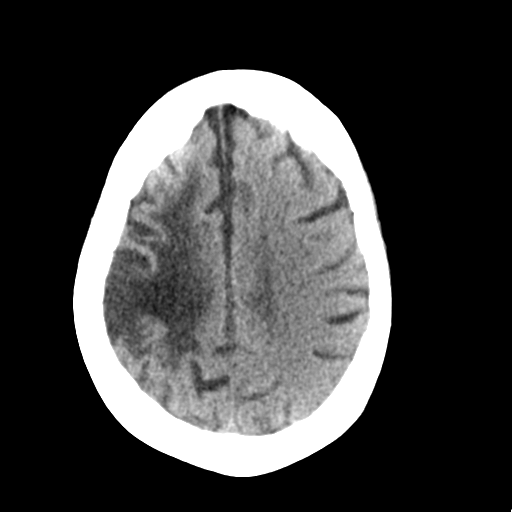
[im 23/32  brain]
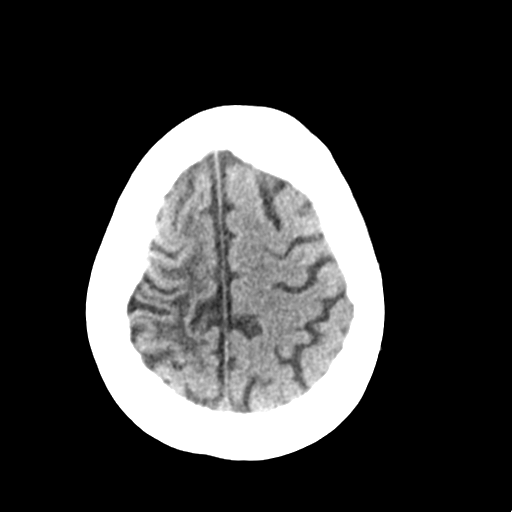
[im 27/32  brain]
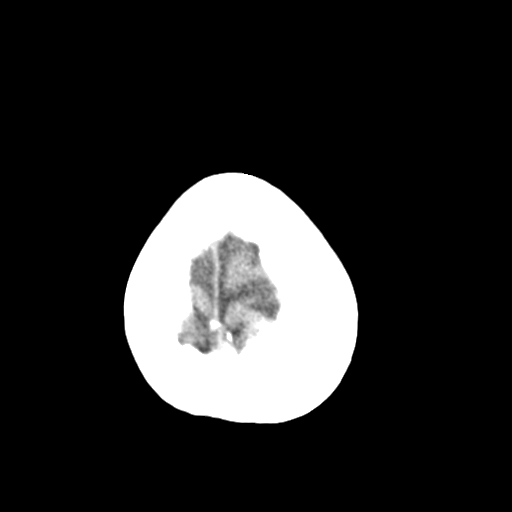
[im 27/32  bone]
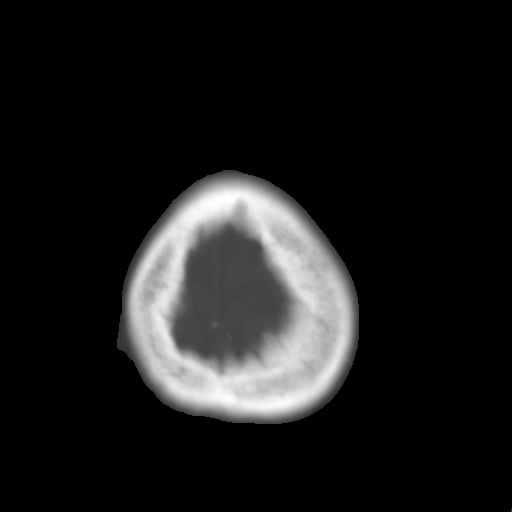
[im 29/32  brain]
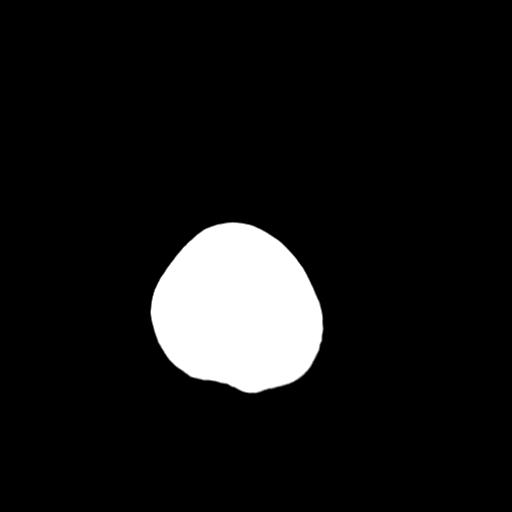

[Series 6: coronal soft tissue · coronal · 0.30mm/px · 3 of 60 slices shown]
[im 20/60  brain]
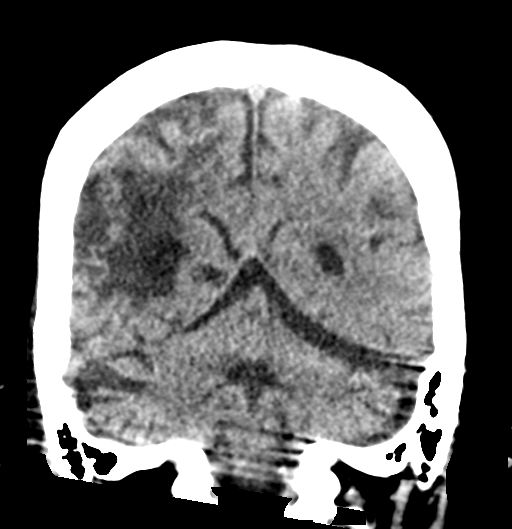
[im 27/60  brain]
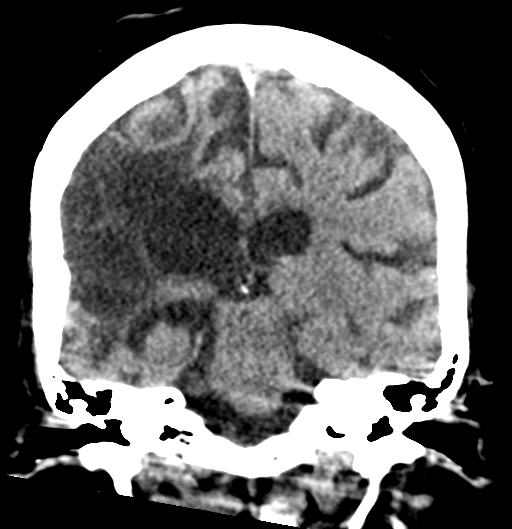
[im 33/60  brain]
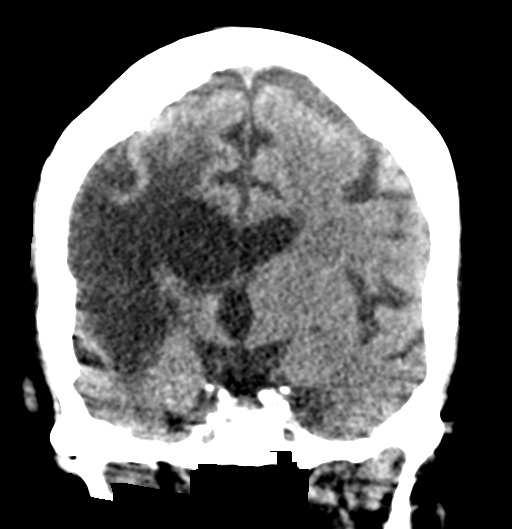

[Series 7: sagittal soft tissue · sagittal · 0.31mm/px · 3 of 44 slices shown]
[im 16/44  brain]
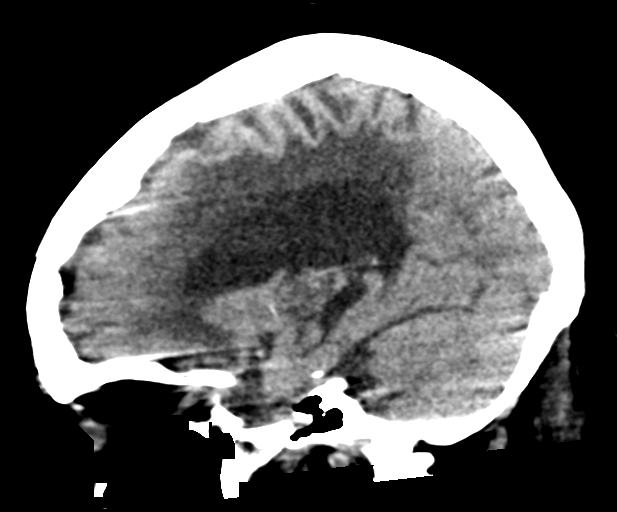
[im 22/44  brain]
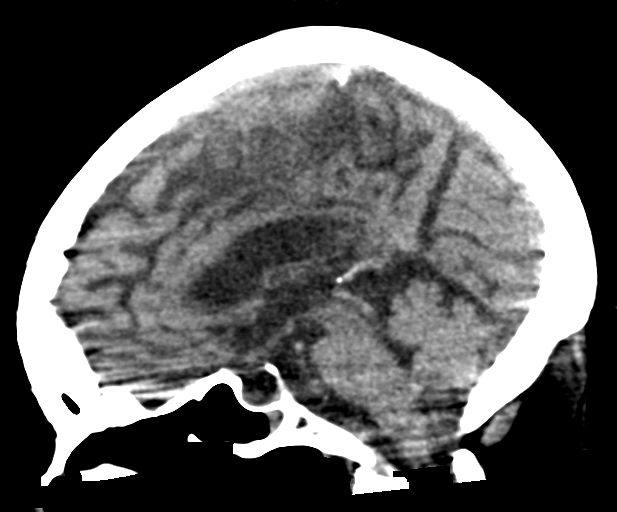
[im 29/44  brain]
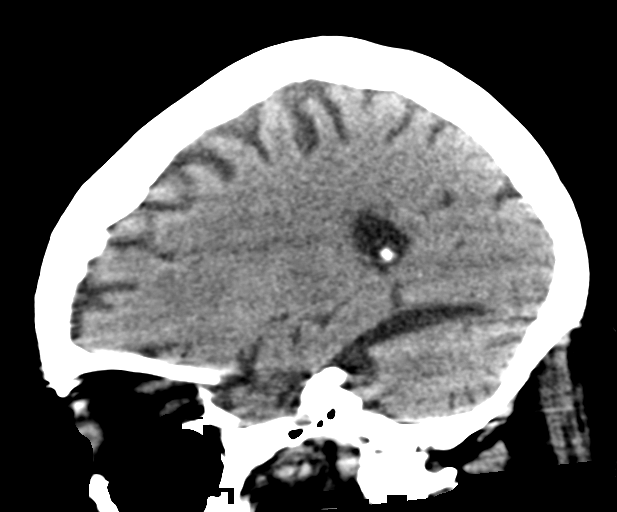

[16 of 47 positions shown; findings below may reference images not displayed]

FINDINGS: Brain: There is mild cerebral atrophy with widening of the
extra-axial spaces and ventricular dilatation.
There are areas of decreased attenuation within the white matter
tracts of the supratentorial brain, consistent with microvascular
disease changes.

A large area of cortical encephalomalacia, with adjacent chronic
white matter low attenuation, is seen within the right parietal
lobe. Associated ex vacuo dilatation of the right lateral ventricle
is seen. These findings are present on the prior exam.

Vascular: No hyperdense vessel or unexpected calcification.

Skull: Normal. Negative for fracture or focal lesion.

Sinuses/Orbits: There is mild sphenoid sinus mucosal thickening.

Other: None.
IMPRESSION: 1. Generalized cerebral atrophy.
2. Large, chronic right parietal lobe infarct.
3. No acute intracranial abnormality.

## 2020-09-13 IMAGING — MR MR HEAD W/O CM
2 series · 48 of 48 positions shown · non-contrast
Comparison: Prior head CT from 05/31/2019.

CLINICAL DATA: Initial evaluation for acute encephalopathy.

EXAM:
MRI HEAD WITHOUT CONTRAST
TECHNIQUE: Multiplanar, multiecho pulse sequences of the brain and surrounding
structures were obtained without intravenous contrast.

[Series 2: ax dwi_tracew · axial · 3.0mm · 1.31mm/px · z∈[-154,+0]mm · 24 of 48 slices shown]
[im 1/48]
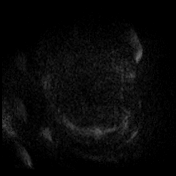
[im 3/48]
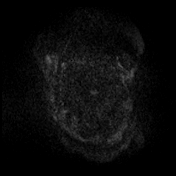
[im 5/48]
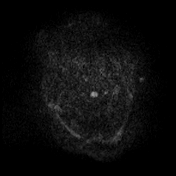
[im 7/48]
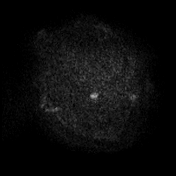
[im 9/48]
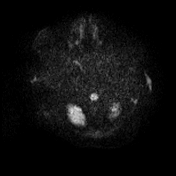
[im 11/48]
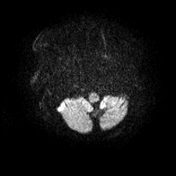
[im 13/48]
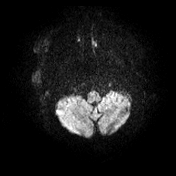
[im 15/48]
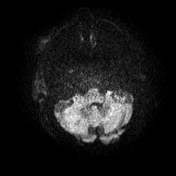
[im 17/48]
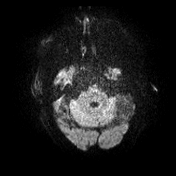
[im 19/48]
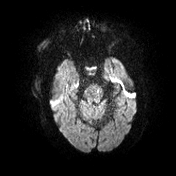
[im 21/48]
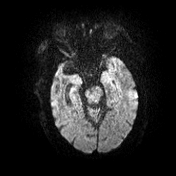
[im 23/48]
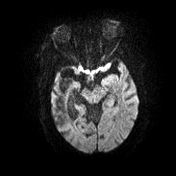
[im 25/48]
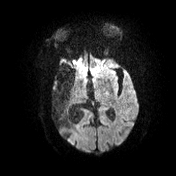
[im 27/48]
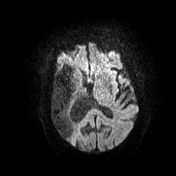
[im 29/48]
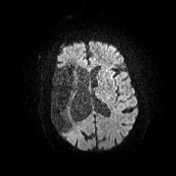
[im 31/48]
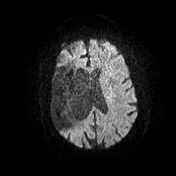
[im 33/48]
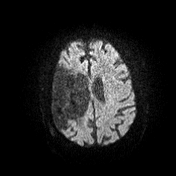
[im 35/48]
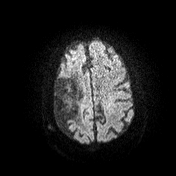
[im 37/48]
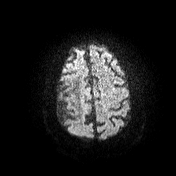
[im 39/48]
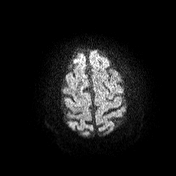
[im 41/48]
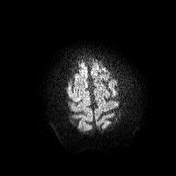
[im 43/48]
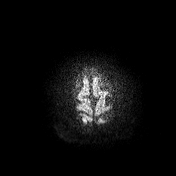
[im 45/48]
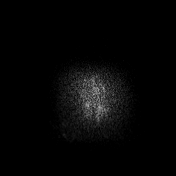
[im 48/48]
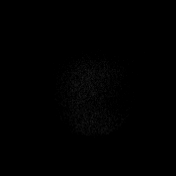

[Series 3: ax dwi_adc · axial · 3.0mm · 1.31mm/px · z∈[-154,-3]mm · 24 of 47 slices shown]
[im 1/47]
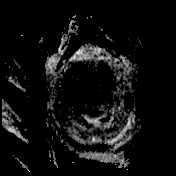
[im 3/47]
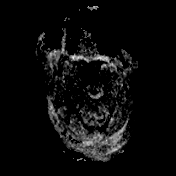
[im 5/47]
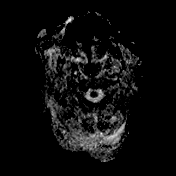
[im 7/47]
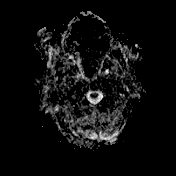
[im 9/47]
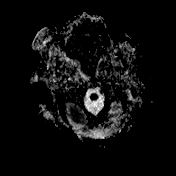
[im 11/47]
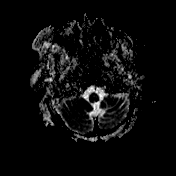
[im 13/47]
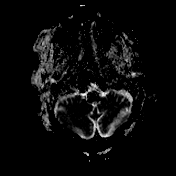
[im 15/47]
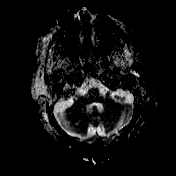
[im 17/47]
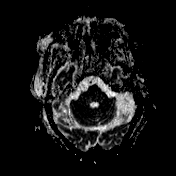
[im 19/47]
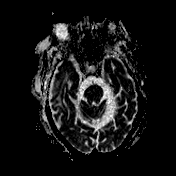
[im 21/47]
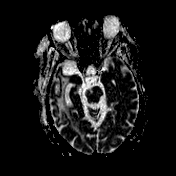
[im 23/47]
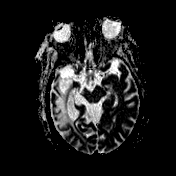
[im 25/47]
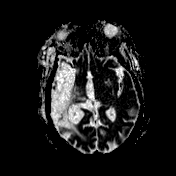
[im 27/47]
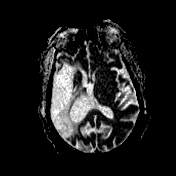
[im 29/47]
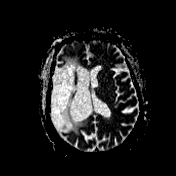
[im 31/47]
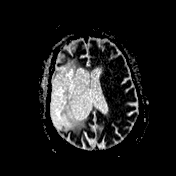
[im 33/47]
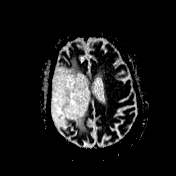
[im 35/47]
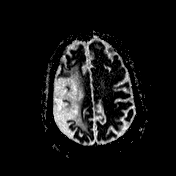
[im 37/47]
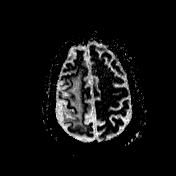
[im 39/47]
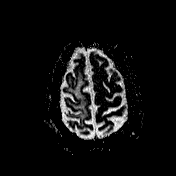
[im 41/47]
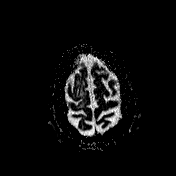
[im 43/47]
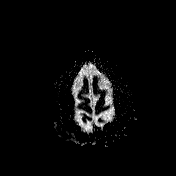
[im 45/47]
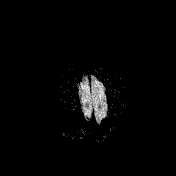
[im 47/47]
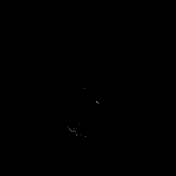

[48 of 48 positions shown; findings below may reference images not displayed]

FINDINGS: Brain: Examination technically limited as the patient's vital signs
peaking unstable during the course of the exam. Examination was
terminated prematurely. Axial DWI sequence only was performed.

Diffusion-weighted imaging demonstrates no evidence for acute or
subacute ischemia. Large area of encephalomalacia of involving the
right cerebral hemisphere compatible with a chronic right MCA
territory infarct. No other visible areas of chronic infarction. No
appreciable mass lesion, mass effect, or midline shift on this
limited exam. Ex vacuo dilatation of the right lateral ventricle
related to the chronic right cerebral encephalomalacia. No
hydrocephalus. No extra-axial fluid collection. No obvious acute
intracranial abnormality on this limited exam.

Vascular: Not assessed on this limited exam.

Skull and upper cervical spine: Not well assessed on this limited
exam.

Sinuses/Orbits: Not well assessed on this limited exam.

Other: None.
IMPRESSION: 1. Technically limited exam with only axial DWI sequence performed.
Patient's vital signs became unstable during the course of the
study, which as a result was terminated early.
2. No evidence for acute or subacute infarct. No other definite
acute intracranial abnormality on this limited study.
3. Chronic right MCA territory infarct.

## 2020-09-15 IMAGING — DX DG CHEST 1V PORT
1 series · 1 of 1 positions shown · non-contrast
Comparison: Chest x-ray 05/31/2019.

CLINICAL DATA: 74-year-old female with history of shortness of
breath.

EXAM:
PORTABLE CHEST 1 VIEW

[chest ap]
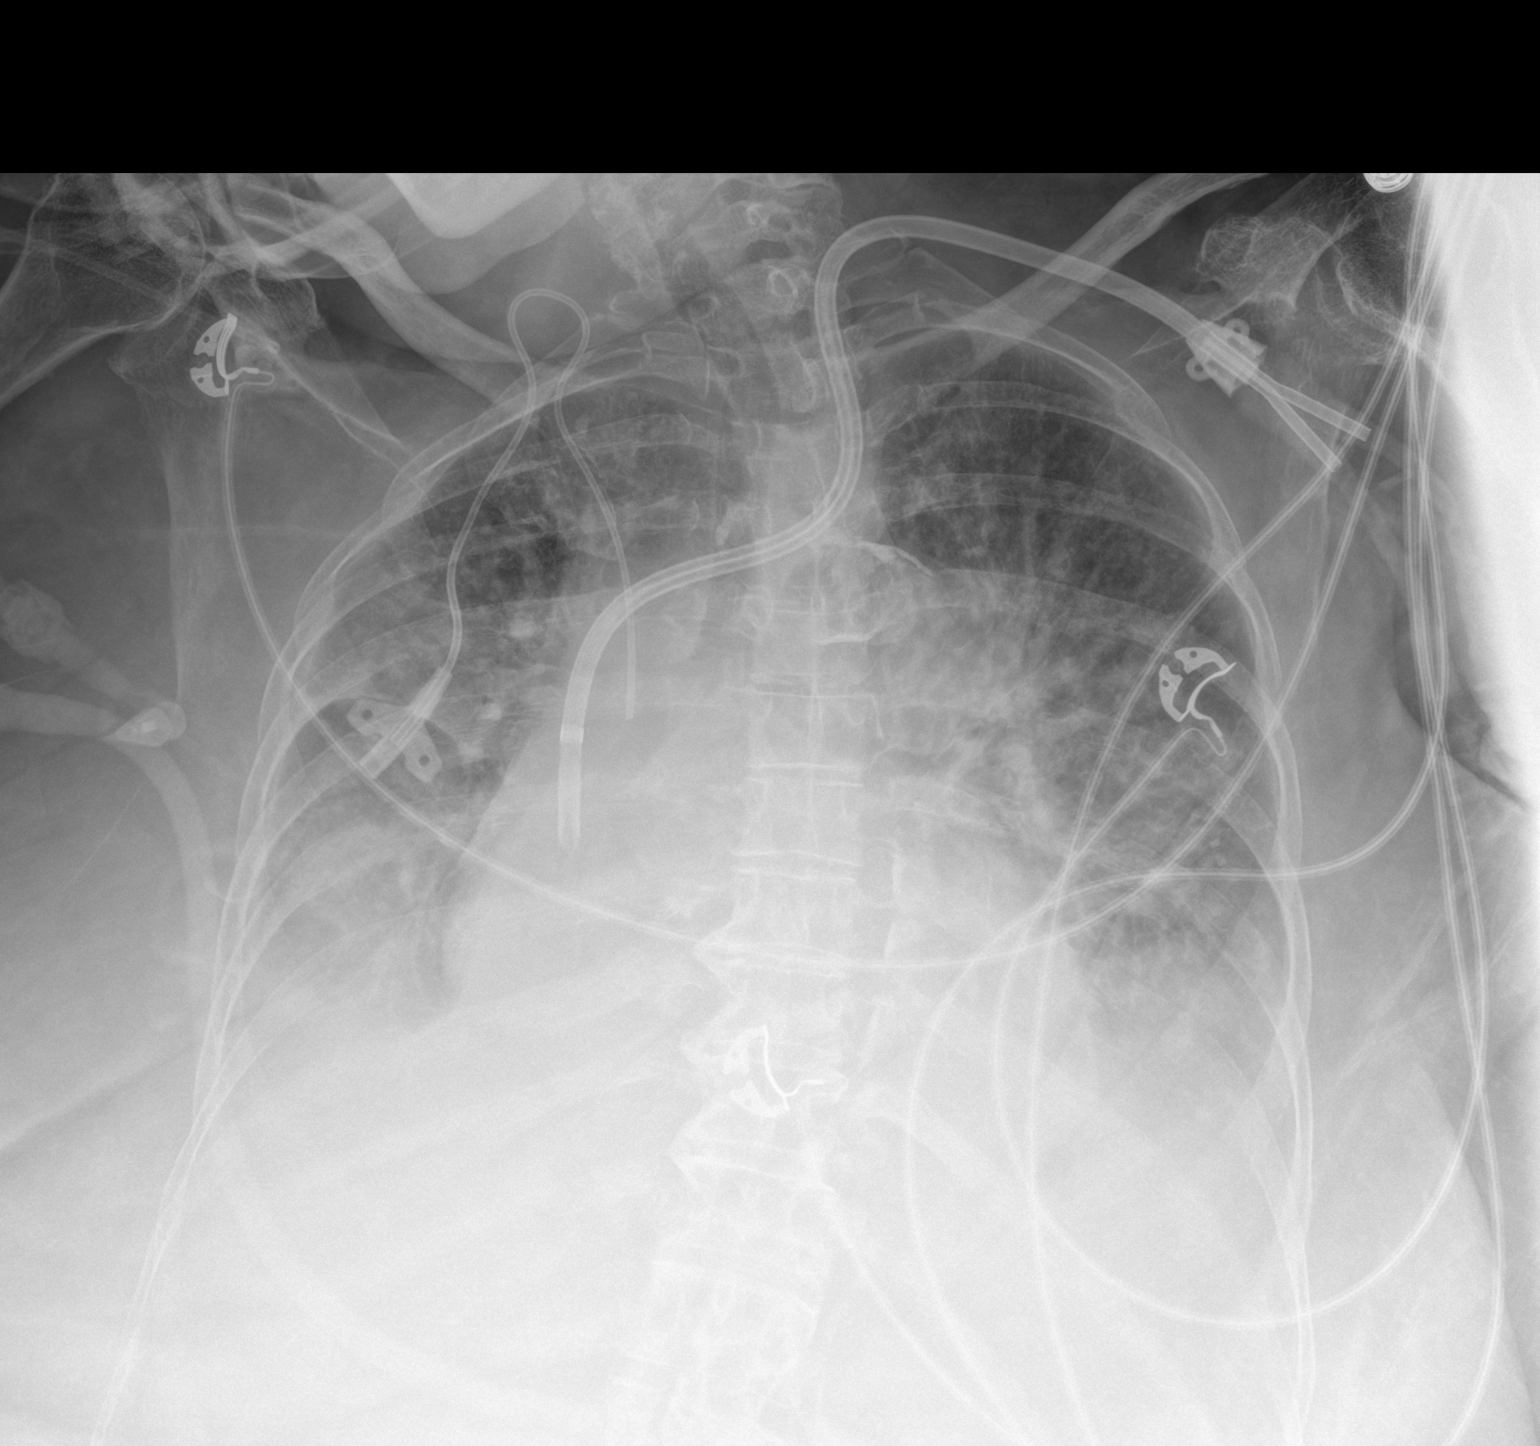

[1 of 1 positions shown; findings below may reference images not displayed]

FINDINGS: Right internal jugular central venous catheter with tip terminating
at the superior cavoatrial junction. Left internal jugular PermCath
with tip terminating in the right atrium. There is marked
cephalization of the pulmonary vasculature, severe indistinctness of
the interstitial markings, and extensive multifocal airspace disease
throughout the lungs bilaterally suggestive of severe pulmonary
edema. Moderate bilateral pleural effusions. Bibasilar opacities
which also likely reflects some degree of underlying subsegmental
atelectasis. Mild cardiomegaly. The patient is rotated to the right
on today's exam, resulting in distortion of the mediastinal contours
and reduced diagnostic sensitivity and specificity for mediastinal
pathology. Aortic atherosclerosis.
IMPRESSION: 1. Support apparatus, as above.
2. The appearance of the chest is most suggestive of congestive
heart failure, as above.
3. Aortic atherosclerosis.
